# Patient Record
Sex: Male | Born: 1940 | Race: White | Hispanic: No | Marital: Married | Smoking: Former smoker
Health system: Southern US, Community
[De-identification: ages and names within clinical notes are randomized; demographics above are authoritative.]

## PROBLEM LIST (undated history)

## (undated) DIAGNOSIS — R32 Unspecified urinary incontinence: Secondary | ICD-10-CM

## (undated) DIAGNOSIS — R195 Other fecal abnormalities: Secondary | ICD-10-CM

## (undated) DIAGNOSIS — T7840XA Allergy, unspecified, initial encounter: Secondary | ICD-10-CM

## (undated) DIAGNOSIS — Z5189 Encounter for other specified aftercare: Secondary | ICD-10-CM

## (undated) DIAGNOSIS — F32A Depression, unspecified: Secondary | ICD-10-CM

## (undated) DIAGNOSIS — H9190 Unspecified hearing loss, unspecified ear: Secondary | ICD-10-CM

## (undated) DIAGNOSIS — K08109 Complete loss of teeth, unspecified cause, unspecified class: Secondary | ICD-10-CM

## (undated) DIAGNOSIS — I10 Essential (primary) hypertension: Secondary | ICD-10-CM

## (undated) DIAGNOSIS — R6889 Other general symptoms and signs: Secondary | ICD-10-CM

## (undated) DIAGNOSIS — Z860100 Personal history of colon polyps, unspecified: Secondary | ICD-10-CM

## (undated) DIAGNOSIS — Z972 Presence of dental prosthetic device (complete) (partial): Secondary | ICD-10-CM

## (undated) DIAGNOSIS — H269 Unspecified cataract: Secondary | ICD-10-CM

## (undated) DIAGNOSIS — M1990 Unspecified osteoarthritis, unspecified site: Secondary | ICD-10-CM

## (undated) DIAGNOSIS — C801 Malignant (primary) neoplasm, unspecified: Secondary | ICD-10-CM

## (undated) DIAGNOSIS — E785 Hyperlipidemia, unspecified: Secondary | ICD-10-CM

## (undated) DIAGNOSIS — Z87440 Personal history of urinary (tract) infections: Secondary | ICD-10-CM

## (undated) DIAGNOSIS — N400 Enlarged prostate without lower urinary tract symptoms: Secondary | ICD-10-CM

## (undated) DIAGNOSIS — K469 Unspecified abdominal hernia without obstruction or gangrene: Secondary | ICD-10-CM

## (undated) DIAGNOSIS — Z9109 Other allergy status, other than to drugs and biological substances: Secondary | ICD-10-CM

## (undated) DIAGNOSIS — F329 Major depressive disorder, single episode, unspecified: Secondary | ICD-10-CM

## (undated) HISTORY — DX: Depression, unspecified: F32.A

## (undated) HISTORY — DX: Benign prostatic hyperplasia without lower urinary tract symptoms: N40.0

## (undated) HISTORY — PX: COLONOSCOPY: SHX174

## (undated) HISTORY — DX: Other general symptoms and signs: R68.89

## (undated) HISTORY — DX: Complete loss of teeth, unspecified cause, unspecified class: K08.109

## (undated) HISTORY — DX: Allergy, unspecified, initial encounter: T78.40XA

## (undated) HISTORY — DX: Unspecified hearing loss, unspecified ear: H91.90

## (undated) HISTORY — DX: Other fecal abnormalities: R19.5

## (undated) HISTORY — DX: Hyperlipidemia, unspecified: E78.5

## (undated) HISTORY — DX: Major depressive disorder, single episode, unspecified: F32.9

## (undated) HISTORY — DX: Unspecified osteoarthritis, unspecified site: M19.90

## (undated) HISTORY — PX: MULTIPLE TOOTH EXTRACTIONS: SHX2053

## (undated) HISTORY — DX: Complete loss of teeth, unspecified cause, unspecified class: Z97.2

## (undated) HISTORY — DX: Encounter for other specified aftercare: Z51.89

## (undated) HISTORY — DX: Essential (primary) hypertension: I10

---

## 1997-12-17 ENCOUNTER — Encounter: Payer: Self-pay | Admitting: Internal Medicine

## 1997-12-17 ENCOUNTER — Ambulatory Visit (HOSPITAL_COMMUNITY): Admission: RE | Admit: 1997-12-17 | Discharge: 1997-12-17 | Payer: Self-pay | Admitting: Internal Medicine

## 2003-10-24 ENCOUNTER — Other Ambulatory Visit: Admission: RE | Admit: 2003-10-24 | Discharge: 2003-10-24 | Payer: Self-pay | Admitting: Dermatology

## 2004-07-20 ENCOUNTER — Ambulatory Visit: Payer: Self-pay | Admitting: Internal Medicine

## 2004-09-04 ENCOUNTER — Ambulatory Visit: Payer: Self-pay | Admitting: Internal Medicine

## 2004-09-11 ENCOUNTER — Ambulatory Visit: Payer: Self-pay | Admitting: Internal Medicine

## 2004-09-15 ENCOUNTER — Encounter: Admission: RE | Admit: 2004-09-15 | Discharge: 2004-09-15 | Payer: Self-pay | Admitting: Internal Medicine

## 2004-10-06 ENCOUNTER — Ambulatory Visit: Payer: Self-pay | Admitting: Internal Medicine

## 2005-05-25 ENCOUNTER — Ambulatory Visit: Payer: Self-pay | Admitting: Internal Medicine

## 2006-01-14 ENCOUNTER — Ambulatory Visit: Payer: Self-pay | Admitting: Internal Medicine

## 2006-04-08 ENCOUNTER — Ambulatory Visit: Payer: Self-pay | Admitting: Endocrinology

## 2006-05-31 ENCOUNTER — Ambulatory Visit: Payer: Self-pay | Admitting: Internal Medicine

## 2006-05-31 LAB — CONVERTED CEMR LAB
Calcium: 9 mg/dL (ref 8.4–10.5)
Chloride: 102 meq/L (ref 96–112)
Direct LDL: 134.8 mg/dL
GFR calc non Af Amer: 90 mL/min
Glucose, Bld: 108 mg/dL — ABNORMAL HIGH (ref 70–99)
PSA: 0.56 ng/mL (ref 0.10–4.00)
TSH: 1.74 microintl units/mL (ref 0.35–5.50)
Total CHOL/HDL Ratio: 6

## 2006-07-05 ENCOUNTER — Ambulatory Visit: Payer: Self-pay | Admitting: Internal Medicine

## 2006-07-05 LAB — CONVERTED CEMR LAB
Bilirubin Urine: NEGATIVE
Hemoglobin, Urine: NEGATIVE
Nitrite: NEGATIVE
Total Protein, Urine: NEGATIVE mg/dL
Urine Glucose: NEGATIVE mg/dL
Urobilinogen, UA: 0.2 (ref 0.0–1.0)
pH: 6 (ref 5.0–8.0)

## 2006-09-20 ENCOUNTER — Ambulatory Visit: Payer: Self-pay | Admitting: Internal Medicine

## 2006-09-24 ENCOUNTER — Emergency Department (HOSPITAL_COMMUNITY): Admission: EM | Admit: 2006-09-24 | Discharge: 2006-09-24 | Payer: Self-pay | Admitting: Family Medicine

## 2006-10-26 ENCOUNTER — Ambulatory Visit: Payer: Self-pay | Admitting: Internal Medicine

## 2006-11-23 ENCOUNTER — Encounter: Payer: Self-pay | Admitting: Internal Medicine

## 2006-11-23 ENCOUNTER — Ambulatory Visit: Payer: Self-pay | Admitting: Internal Medicine

## 2006-11-23 DIAGNOSIS — R319 Hematuria, unspecified: Secondary | ICD-10-CM | POA: Insufficient documentation

## 2006-11-23 DIAGNOSIS — I10 Essential (primary) hypertension: Secondary | ICD-10-CM

## 2006-11-23 DIAGNOSIS — N419 Inflammatory disease of prostate, unspecified: Secondary | ICD-10-CM

## 2006-11-23 DIAGNOSIS — M169 Osteoarthritis of hip, unspecified: Secondary | ICD-10-CM

## 2006-11-23 DIAGNOSIS — F329 Major depressive disorder, single episode, unspecified: Secondary | ICD-10-CM

## 2006-11-23 DIAGNOSIS — N400 Enlarged prostate without lower urinary tract symptoms: Secondary | ICD-10-CM

## 2006-11-23 DIAGNOSIS — E782 Mixed hyperlipidemia: Secondary | ICD-10-CM | POA: Insufficient documentation

## 2006-11-23 DIAGNOSIS — E785 Hyperlipidemia, unspecified: Secondary | ICD-10-CM

## 2006-12-21 DIAGNOSIS — Z5189 Encounter for other specified aftercare: Secondary | ICD-10-CM

## 2006-12-21 HISTORY — PX: JOINT REPLACEMENT: SHX530

## 2006-12-21 HISTORY — DX: Encounter for other specified aftercare: Z51.89

## 2007-01-04 ENCOUNTER — Encounter: Payer: Self-pay | Admitting: *Deleted

## 2007-01-09 ENCOUNTER — Inpatient Hospital Stay (HOSPITAL_COMMUNITY): Admission: RE | Admit: 2007-01-09 | Discharge: 2007-01-13 | Payer: Self-pay | Admitting: Orthopedic Surgery

## 2007-02-03 ENCOUNTER — Telehealth: Payer: Self-pay | Admitting: Internal Medicine

## 2007-06-23 ENCOUNTER — Telehealth: Payer: Self-pay | Admitting: Internal Medicine

## 2007-10-02 ENCOUNTER — Telehealth: Payer: Self-pay | Admitting: Internal Medicine

## 2007-10-17 ENCOUNTER — Ambulatory Visit: Payer: Self-pay | Admitting: Internal Medicine

## 2007-10-17 DIAGNOSIS — B3749 Other urogenital candidiasis: Secondary | ICD-10-CM

## 2007-11-06 ENCOUNTER — Telehealth: Payer: Self-pay | Admitting: Internal Medicine

## 2008-01-12 ENCOUNTER — Ambulatory Visit: Payer: Self-pay | Admitting: Internal Medicine

## 2008-01-30 ENCOUNTER — Ambulatory Visit: Payer: Self-pay | Admitting: Internal Medicine

## 2008-01-30 DIAGNOSIS — N478 Other disorders of prepuce: Secondary | ICD-10-CM

## 2008-01-30 DIAGNOSIS — N471 Phimosis: Secondary | ICD-10-CM

## 2008-03-27 ENCOUNTER — Encounter: Payer: Self-pay | Admitting: Internal Medicine

## 2008-08-26 ENCOUNTER — Encounter: Payer: Self-pay | Admitting: Internal Medicine

## 2008-09-13 ENCOUNTER — Ambulatory Visit: Payer: Self-pay | Admitting: Internal Medicine

## 2008-09-13 LAB — CONVERTED CEMR LAB
ALT: 19 units/L (ref 0–53)
Albumin: 3.7 g/dL (ref 3.5–5.2)
BUN: 22 mg/dL (ref 6–23)
CO2: 33 meq/L — ABNORMAL HIGH (ref 19–32)
Calcium: 8.6 mg/dL (ref 8.4–10.5)
GFR calc non Af Amer: 118.79 mL/min (ref >60)
Glucose, Bld: 102 mg/dL — ABNORMAL HIGH (ref 70–99)
HDL: 40.8 mg/dL (ref >39.00)
Total Protein: 6.9 g/dL (ref 6.0–8.3)
Triglycerides: 206 mg/dL — ABNORMAL HIGH (ref 0.0–149.0)

## 2008-09-15 ENCOUNTER — Encounter: Payer: Self-pay | Admitting: Internal Medicine

## 2008-10-08 ENCOUNTER — Encounter: Payer: Self-pay | Admitting: Internal Medicine

## 2008-12-10 ENCOUNTER — Ambulatory Visit: Payer: Self-pay | Admitting: Internal Medicine

## 2008-12-10 LAB — CONVERTED CEMR LAB
Albumin: 3.5 g/dL (ref 3.5–5.2)
Alkaline Phosphatase: 61 units/L (ref 39–117)
Bilirubin, Direct: 0.1 mg/dL (ref 0.0–0.3)
LDL Cholesterol: 110 mg/dL — ABNORMAL HIGH (ref 0–99)
Total CHOL/HDL Ratio: 5
Total Protein: 7 g/dL (ref 6.0–8.3)
Triglycerides: 155 mg/dL — ABNORMAL HIGH (ref 0.0–149.0)
VLDL: 31 mg/dL (ref 0.0–40.0)

## 2008-12-14 ENCOUNTER — Encounter: Payer: Self-pay | Admitting: Internal Medicine

## 2009-01-14 ENCOUNTER — Ambulatory Visit: Payer: Self-pay | Admitting: Internal Medicine

## 2009-11-21 ENCOUNTER — Encounter: Admission: RE | Admit: 2009-11-21 | Discharge: 2009-11-21 | Payer: Self-pay | Admitting: Orthopedic Surgery

## 2009-12-02 ENCOUNTER — Encounter: Payer: Self-pay | Admitting: Internal Medicine

## 2009-12-02 ENCOUNTER — Ambulatory Visit: Payer: Self-pay | Admitting: Internal Medicine

## 2009-12-02 DIAGNOSIS — M51379 Other intervertebral disc degeneration, lumbosacral region without mention of lumbar back pain or lower extremity pain: Secondary | ICD-10-CM | POA: Insufficient documentation

## 2009-12-02 DIAGNOSIS — M5137 Other intervertebral disc degeneration, lumbosacral region: Secondary | ICD-10-CM

## 2009-12-09 ENCOUNTER — Ambulatory Visit: Payer: Self-pay | Admitting: Internal Medicine

## 2009-12-09 LAB — CONVERTED CEMR LAB
ALT: 18 units/L (ref 0–53)
AST: 26 units/L (ref 0–37)
Albumin: 3.7 g/dL (ref 3.5–5.2)
Calcium: 8.7 mg/dL (ref 8.4–10.5)
GFR calc non Af Amer: 112.77 mL/min (ref >60)
Glucose, Bld: 94 mg/dL (ref 70–99)
HDL: 35.5 mg/dL — ABNORMAL LOW (ref >39.00)
PSA: 0.2 ng/mL (ref 0.10–4.00)
Potassium: 4.4 meq/L (ref 3.5–5.1)
Sodium: 141 meq/L (ref 135–145)
Total Bilirubin: 0.4 mg/dL (ref 0.3–1.2)
Triglycerides: 157 mg/dL — ABNORMAL HIGH (ref 0.0–149.0)
VLDL: 31.4 mg/dL (ref 0.0–40.0)

## 2009-12-15 ENCOUNTER — Encounter: Payer: Self-pay | Admitting: Internal Medicine

## 2010-03-31 ENCOUNTER — Telehealth: Payer: Self-pay | Admitting: Internal Medicine

## 2010-04-21 NOTE — Letter (Signed)
[facility] [address] [place], [place]  [address] Phone: [phone]      December 15, 2009   Gabriel Love [address] [place], [place] [address]  RE:  LAB RESULTS  Dear  Mr. Shatto,  The following is an interpretation of your most recent lab tests.  Please take note of any instructions provided or changes to medications that have resulted from your lab work.  PSA:  normal - no follow-up needed PSA: 0.20  ELECTROLYTES:  Good - no changes needed  KIDNEY FUNCTION TESTS:  Good - no changes needed  LIVER FUNCTION TESTS:  Good - no changes needed  LIPID PANEL:  Stable - no changes needed Triglyceride: 157.0   Cholesterol: 181   LDL: 114   HDL: 35.50   Chol/HDL%:  5   DIABETIC STUDIES:  Good - no changes needed Blood Glucose: 94   HgbA1C: 5.9     lab results look good. Keep up the good work.    Sincerely Yours,    Jacques Navy MD  Patient: [name] Note: All result statuses are Final unless otherwise noted.  Tests: (1) Lipid Panel (LIPID)   Cholesterol               181 mg/dL                   2-956     ATP III Classification            Desirable:  < 200 mg/dL                    Borderline High:  200 - 239 mg/dL               High:  > = 240 mg/dL   Triglycerides        [H]  157.0 mg/dL                 2.1-308.6     Normal:  <150 mg/dL     Borderline High:  578 - 199 mg/dL   HDL                  [L]  46.96 mg/dL                 >29.52   VLDL Cholesterol          31.4 mg/dL                  8.4-13.2   LDL Cholesterol      [H]  440 mg/dL                   1-02  CHO/HDL Ratio:  CHD Risk                             5                    Men          Women     1/2 Average Risk     3.4          3.3     Average Risk          5.0          4.4     2X Average Risk          9.6  7.1     3X Average Risk          15.0          11.0                           Tests: (2) Hepatic/Liver Function Panel (HEPATIC)   Total Bilirubin           0.4  mg/dL                   8.1-1.9   Direct Bilirubin          0.1 mg/dL                   1.4-7.8   Alkaline Phosphatase      60 U/L                      39-117   AST                       26 U/L                      0-37   ALT                       18 U/L                      0-53   Total Protein             6.6 g/dL                    2.9-5.6   Albumin                   3.7 g/dL                    2.1-3.0  Tests: (3) BMP (METABOL)   Sodium                    141 mEq/L                   135-145   Potassium                 4.4 mEq/L                   3.5-5.1   Chloride                  103 mEq/L                   96-112   Carbon Dioxide       [H]  33 mEq/L                    19-32   Glucose                   94 mg/dL                    86-57   BUN                       18 mg/dL                    8-46   Creatinine  0.7 mg/dL                   1.6-1.0   Calcium                   8.7 mg/dL                   9.6-04.5   GFR                       112.77 mL/min               >60  Tests: (4) Prostate Specific Antigen (PSA)   PSA-Hyb                   0.20 ng/mL                  0.10-4.00

## 2010-04-21 NOTE — Assessment & Plan Note (Signed)
Summary: per flag cpx/#/cd   RS'D PER WIFE /NWS   Vital Signs:  Patient profile:   70 year old male Height:      73 inches Weight:      335 pounds BMI:     44.36 O2 Sat:      97 % on Room air Temp:     96.4 degrees F oral Pulse rate:   70 / minute BP sitting:   134 / 72  (left arm) Cuff size:   large  Vitals Entered By: Bill Salinas CMA (December 02, 2009 10:07 AM)  O2 Flow:  Room air CC: cpx/ refills on meds Comments pt will get flu shot today  Vision Screening:      Vision Comments: April 2010 normal exam   Primary Care Provider:  Norins  CC:  cpx/ refills on meds.  History of Present Illness: Patient presents for routine medical exam. He has been having left leg pain worse with walking. No pain with sitting or laying down. He has had MRI spine with appearance of bulging disks and bony changes (images reviewed). He is consulting with Dr. Ophelia Charter. He will be having ESI for relief.   He reports he is generally feeling OK. He remains very active: he is Tax adviser for [employer] and remains independent in all ADLs. 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 His spirits are good and he enjoys his hobbies and family. No evidence of sign of depression.   Preventive Screening-Counseling & Management  Alcohol-Tobacco     Alcohol drinks/day: 0     Smoking Status: quit     Year Quit: 1970  Caffeine-Diet-Exercise     Caffeine use/day: 5 drinks per day     Does Patient Exercise: no  Hep-HIV-STD-Contraception     Dental Visit-last 6 months no     Sun Exposure-Excessive: no  Safety-Violence-Falls     Seat Belt Use: yes     Firearms in the Home: firearms in the home     Smoke Detectors: yes     Violence in the Home: no risk noted     Sexual Abuse: no     Fall Risk: Low Fall Risk      Drug Use:  never.    Current Medications (verified): 1)  Ecotrin 325 Mg  Tbec (Aspirin) .... Once Daily 2)  Hydrochlorothiazide 25 Mg   Tabs (Hydrochlorothiazide) .... Once Daily 3)  Finasteride 5 Mg  Tabs (Finasteride) .... Once Daily 4)  Lexapro 10 Mg  Tabs (Escitalopram Oxalate) .... Once Daily 5)  Flomax 0.4 Mg  Cp24 (Tamsulosin Hcl) .... Once Daily 6)  Vitamin C 500 Mg  Tabs (Ascorbic Acid) .... Take 2 Tablet By Mouth Once A Day 7)  Lovastatin 20 Mg Tabs (Lovastatin) .Marland Kitchen.. 1 By Mouth Q Pm For Cholesterol  Allergies (verified): 1)  ! Penicillin 2)  ! 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  Family History: father - deceased @ 21: PNA, HTN mother - deceased @ 16: CAD/MI with rupture of ventricle, HTN, DM Neg- colon or prostate cancer  Social History: married '65 -very supportive wife 1 daughter - '48; 1 grandchild (girl 2002) retired from Recruitment consultant work Caffeine use/day:  5 drinks per day Does Patient Exercise:  no Dental Care w/in 6 mos.:  no Sun Exposure-Excessive:  no Seat Belt Use:  yes Fall Risk:  Low Fall Risk Drug Use:  never  Review of Systems  The patient denies anorexia, fever, weight loss, weight gain, decreased hearing, chest pain, syncope, dyspnea on exertion, prolonged cough, hemoptysis, abdominal pain, severe indigestion/heartburn, incontinence, muscle weakness, difficulty walking, depression, and enlarged lymph nodes.    Physical Exam  General:  very large, overweight white male in no distress Head:  normocephalic, atraumatic, and no abnormalities observed.   Eyes:  vision grossly intact, pupils equal, pupils round, corneas and lenses clear, and no optic disk abnormalities.   Ears:  hearing aids both ears. EACs with moderate cerumen right, left is clear Nose:  no external deformity and no external erythema.   Mouth:  upper denture. missing majority of lower  teeth. Has a broken incisor right. No buccal lesions. Posterior pharynx clear Neck:  supple, no thyromegaly, and no carotid bruits.   Chest Wall:  No deformities, masses, tenderness or gynecomastia noted. Lungs:  Normal respiratory effort, chest expands symmetrically. Lungs are clear to auscultation, no crackles or wheezes. Heart:  Normal rate and regular rhythm. S1 and S2 normal without gallop, murmur, click, rub or other extra sounds. Abdomen:  Obese,soft, normal bowel sounds, and no guarding.   Rectal:  deferred Msk:  normal ROM, no joint tenderness, no joint swelling, no joint warmth, and no joint deformities.   Pulses:  2+ radial pulse Extremities:  No clubbing, cyanosis, edema, or deformity noted with normal full range of motion of all joints.   Neurologic:  alert & oriented X3, cranial nerves II-XII intact, strength normal in all extremities, gait normal, and DTRs symmetrical and normal.   Skin:  turgor normal, no rashes, no suspicious lesions, and no ulcerations.   Cervical Nodes:  no anterior cervical adenopathy and no posterior cervical adenopathy.   Psych:  Oriented X3, memory intact for recent and remote, good eye contact, and not anxious appearing.     Impression & Recommendations:  Problem # 1:  OBESITY NOS (ICD-278.00) Majaor issue regarding his health. Counselled on weight management: smart food choices, portion size control (meal of 1000 chews), strongly recommended 3 meals a day and a snack. Target weight 280. Goal - to loose 1 lb/month.  Problem # 2:  DEPRESSION (ICD-311) Doing very well with lexapro with  no medication problems as long as he takes brand name.  Plan - continue meds.   His updated medication list for this problem includes:    Lexapro 10 Mg Tabs (Escitalopram oxalate) ..... Once daily  Problem # 3:  HYPERTENSION (ICD-401.9)  His updated medication list for this problem includes:    Hydrochlorothiazide 25 Mg Tabs (Hydrochlorothiazide) ..... Once  daily  BP today: 134/72 Prior BP: 138/88 (09/13/2008)  Labs Reviewed: K+: 3.6 (09/13/2008) Creat: : 0.7 (09/13/2008)   Chol: 175 (12/10/2008)   HDL: 34.30 (12/10/2008)   LDL: 110 (12/10/2008)   TG: 155.0 (12/10/2008)  Adequate control.  Plan - continue present meds           up-date labs  Problem # 4:  HYPERLIPIDEMIA (ICD-272.4) Due for lab work with recommendations to follow.  His updated medication list for this problem includes:    Lovastatin 20 Mg Tabs (Lovastatin) .Marland Kitchen... 1 by mouth q pm for cholesterol  Problem # 5:  DEGENERATIVE DISC DISEASE, LUMBAR SPINE (ICD-722.52) Patient with leg pain left which is attributed to DJD/DDD lumbar spine by MRI. He is seeing orthopedist. He did not have major  radicular symptoms today.   Plan - proceeding with ESI for next Tuesday.  Problem # 6:  Preventive Health Care (ICD-V70.0)  Recent history significant for sciatica otherwise he is doing OK. Exam remarkable for obesity otherwise stable. He is to return for full lab. Discussed colorectal cancer screening and he is willing to consider colonoscopy but wants to wait until his back problems are dealt with. Immunization - Tetnus '09, Pneumonia vaccine '10, flu today. He is [facility] to [facility] for shingles vaccine.  Patient's depression is well controlled. He is cognitively intact (HPI). He is counseled and cautioned about fall risk being elevated seondary to his back problems and that he must be careful about work related injury - minimize lifting. He is counseled about his weight.  In summary - a very nice man who needs colonoscopy, who is contemplating bak surgery if ESI fails but whose medical problems are reasonable controlled. He has increased risks for general anesthesia and surgery secondary to his weight but this is not prohibitive. He will return in 6 months for follow-up. He will return sooner if lab results are abnormal.  Orders: [facility] -Subsequent Annual Wellness Visit [phone])  Complete  Medication List: 1)  Ecotrin 325 Mg Tbec (Aspirin) .... Once daily 2)  Hydrochlorothiazide 25 Mg Tabs (Hydrochlorothiazide) .... Once daily 3)  Finasteride 5 Mg Tabs (Finasteride) .... Once daily 4)  Lexapro 10 Mg Tabs (Escitalopram oxalate) .... Once daily 5)  Flomax 0.4 Mg Cp24 (Tamsulosin hcl) .... Once daily 6)  Vitamin C 500 Mg Tabs (Ascorbic acid) .... Take 2 tablet by mouth once a day 7)  Lovastatin 20 Mg Tabs (Lovastatin) .Marland Kitchen.. 1 by mouth q pm for cholesterol  Other Orders: Flu Vaccine 17yrs + MEDICARE PATIENTS (U0454) Administration Flu vaccine - MCR (U9811)   Preventive Care Screening  Last Pneumovax:    Date:  05/22/2006    Results:  given     Flu Vaccine Consent Questions     Do you have a history of severe allergic reactions to this vaccine? no    Any prior history of allergic reactions to egg and/or gelatin? no    Do you have a sensitivity to the preservative Thimersol? no    Do you have a past history of Guillan-Barre Syndrome? no    Do you currently have an acute febrile illness? no    Have you ever had a severe reaction to latex? no    Vaccine information given and explained to patient? yes    Are you currently pregnant? no    Lot Number:[id number]   Exp Date:09/19/2010   Site Given  Left Deltoid IMedflu

## 2010-04-23 NOTE — Progress Notes (Signed)
Summary: JURY DUTY NOTE  Phone Note Call from Patient Call back at Home Phone [phone]   Caller: Spouse Reason for Call: Talk to Nurse Summary of Call: NEEDS A LETTER TO GET OUT OF JURY DUTY ......DUE TO BR BREAKS AND UNABLE TO SEAT FOR LONG PERIODS OF TIME, PLEASE MAIL LETTER TO HOME Initial call taken by: Migdalia Dk,  March 31, 2010 10:28 AM  Follow-up for Phone Call        need the jury summons with dates and juror number and will do the form letter.  Follow-up by: Jacques Navy MD,  March 31, 2010 6:05 PM  Additional Follow-up for Phone Call Additional follow up Details #1::        Pt advised via home VM to bring letter or call back with requested information. Margaret Pyle, CMA  April 02, 2010 8:35 AM     Additional Follow-up for Phone Call Additional follow up Details #2::    lmoam for pt to call back Follow-up by: Ami Bullins CMA,  April 09, 2010 3:43 PM  Additional Follow-up for Phone Call Additional follow up Details #3:: Details for Additional Follow-up Action Taken: spoke with pts wife. Pt no longer needs letter Additional Follow-up by: Ami Bullins CMA,  April 10, 2010 10:12 AM

## 2010-07-05 ENCOUNTER — Other Ambulatory Visit: Payer: Self-pay | Admitting: Internal Medicine

## 2010-08-04 NOTE — Op Note (Signed)
NAME:  Gabriel Love, Gabriel Love                ACCOUNT NO.:  [id number]   MEDICAL RECORD NO.:  [id number]          PATIENT TYPE:  INP   LOCATION:  5033                         FACILITY:  [facility]   PHYSICIAN:  Loreta Ave, M.D. DATE OF BIRTH:  1940/12/01   DATE OF PROCEDURE:  01/09/2007  DATE OF DISCHARGE:                               OPERATIVE REPORT   PREOPERATIVE DIAGNOSIS:  End-stage degenerative arthritis left hip.  Marked exogenous obesity.   POSTOPERATIVE DIAGNOSIS:  End-stage degenerative arthritis left hip.  Marked exogenous obesity.   PROCEDURE:  Left total hip replacement, Stryker Accolade prosthesis.  Press-Fit #5 femoral component with a 127 degrees neck angle and a +0 x  40 mm metallic head.  Press-Fit 64-mm acetabular component screw  fixation x2 and a 40-mm internal diameter X 3 polyethylene insert.   SURGEON:  Loreta Ave, M.D.   ASSISTANT:  Zonia Kief, PA present throughout the entire case.   ANESTHESIA:  General.   BLOOD LOSS:  250 mL.   BLOOD GIVEN:  None.   SPECIMENS:  None.   CULTURES:  None.   COMPLICATIONS:  None.   PROCEDURE:  Soft compressive with abduction pillow.   PROCEDURE:  The patient brought to the operating room and after adequate  anesthesia been obtained, turned to a lateral position.  Appropriate  padding and support.  His degree of obesity made all positioning, all  anesthesia, all operative intervention, all approaches exceedingly more  difficult.  After being prepped and draped, an incision along the  lateral border of the femur extending posterosuperior.  Skin and  subcutaneous tissue divided.  After going through numerous layers of  subcutaneous fat, iliotibial band exposed, incised and a Charnley  retractor put in place.  External rotator capsule taken down off the  back of the intertrochanteric groove of the femur.  Dislocated  posteriorly.  Grade 4 changes.  Femoral neck was cut one fingerbreadth  above the lesser trochanter  in line with the definitive component.  After much work with exposure, the acetabulum was finally exposed with  appropriate retractors.  Soft tissue removed.  Grade 4 changes.  Sequential reaming up to 64 mm which gave me good fitting and filling of  the acetabulum with appropriate medial and inferior placement.  Cup was  hammered in place with a 64-mm PSL cup at 45 degrees abduction, 20  degrees of anteversion.  Fixation augmented with two screws through the  cup.  Polyethylene attached to the cup.  Attention turned to the femur.  Handheld reamers up to good filling.  Very tight distally, more so than  proximally.  Good sizing fitting with a #5 back loaded component which  restored normal femoral anteversion.  After appropriate trials a #5  component was definitively seated.  Attaching the +0 x 40-mm head and a  127 degrees neck angle, I had good restoration of leg lengths, good  stability in flexion and extension.  Definitive components were  assembled.  The hip was reduced. Wound irrigated.  External rotator and  capsule repaired to the back of the intertrochanteric groove through  drill holes and a FiberWire that was used to tag the external rotators  were tied over bony bridge.  Charnley retractor removed.  Iliotibial  band closed with #1 Vicryl.  Skin and subcutaneous tissue with numerous  layers of Vicryl and staples.  Margins of wound were injected Marcaine.  Sterile compressive dressing applied.  Returned to supine position.  Abduction pillow placed after anesthesia reversed.  Brought to recovery  room.  Tolerated surgery well.  No complications.      Loreta Ave, M.D.  Electronically Signed     DFM/MEDQ  D:  01/09/2007  T:  01/10/2007  Job:  [id number]

## 2010-08-04 NOTE — Assessment & Plan Note (Signed)
[facility]                                 ON-CALL NOTE   Gabriel Love, Gabriel Love                         MRN:          [id number]  DATE:09/24/2006                            DOB:          08-13-1940    PRIMARY CARE PHYSICIAN:  Illene Regulus, M.D.   ON CALL PROGRESS NOTE:  The patient calling because he has hematuria.  Referred to the emergency room for evaluation.     Jeffrey A. Tawanna Cooler, MD  Electronically Signed    JAT/MedQ  DD: 09/24/2006  DT: 09/25/2006  Job #: [id number]   cc:   Rosalyn Gess. Norins, MD

## 2010-08-07 NOTE — Discharge Summary (Signed)
NAME:  TATEConard, Alvira                ACCOUNT NO.:  [id number]   MEDICAL RECORD NO.:  [id number]          PATIENT TYPE:  INP   LOCATION:  5033                         FACILITY:  [facility]   PHYSICIAN:  Loreta Ave, M.D. DATE OF BIRTH:  [birth year]   DATE OF ADMISSION:  01/09/2007  DATE OF DISCHARGE:  01/13/2007                               DISCHARGE SUMMARY   FINAL DIAGNOSES:  1. Status post left total hip replacement for end-stage degenerative      joint disease.  2. Depression.  3. Benign prostatic hypertrophy.  4. Hypertension.   HISTORY OF PRESENT ILLNESS:  This is a 70 year old male with history of  end-stage DJD of left hip with chronic pain, who presented to our office  for preoperative evaluation for a total hip replacement procedure.  He  had progressively worsening pain with no response with conservative  treatment.  Significant decrease in his daily activities due to the  ongoing complaint.   HOSPITAL COURSE:  On January 09, 2007, the patient was taken to the  [facility] OR and had a left total hip replacement procedure performed.  The surgeon was Loreta Ave, MD, and assistant Zonia Kief PA-C.  Anesthesia was general.  No specimens.  EBL 300 mL.  There were no  surgical or anesthesia complications and the patient was transferred to  recovery in stable condition.   On January 10, 2007, the patient was doing well with good pain control.  Temperature 101.4, pulse 107, respirations 20, blood pressure 103/70.  Hemoglobin 10.2, hematocrit 30.7.  Sodium 134, potassium 3.5, chloride  98, CO2 31, BUN 21, creatinine 0.78, glucose 124, INR 1.2.  Dressing was  clean, dry, and intact.  Calf nontender.  Neurovascularly intact.  Discontinued from morphine PCA.  PT/OT consultation.  Pharmacy protocol  Coumadin was started.  On January 11, 2007 the patient had good pain  control.  He complained of feeling weak and light-headed when up with  PT.  He denied chest pain or shortness  of breath.  Hemoglobin 8.6,  hematocrit 25.2.  Sodium 135, potassium 3.8, chloride 98, CO2 32, BUN  14, creatinine 0.81.  Glucose 130.  INR 1.3.  Wound looked good, staples  intact.  No drainage or signs of infection.  Thigh/calf soft and  nontender.  Neurovascularly intact.  Transfused 2 units of packed red  blood cells secondary to acute blood loss anemia.  Lasix 20 mg IV given  after each unit.  Started FeSO4 325 mg p.o. b.i.d.  Stat UA and urine C  and S ordered secondary to abnormal UA previously.   On January 12, 2007, the patient felt much better after transfusion.  He  had good pain control.  Vital signs stable, he was afebrile.  Hemoglobin  10.0, hematocrit 28.3.  Sodium 135, potassium 3.2, chloride 100, CO2 31,  BUN 14, creatinine 0.83, glucose 115, INR 1.2.  Wound okay.  Mild to  moderate serosanguineous drainage.  No signs of infection.  Calf  nontender.  Neurovascularly intact.  Discontinued Foley.  UA was  positive for UTI.  Started Cipro.  KCl 40 mEq b.i.d. x1 day started.  On  January 13, 2007, the patient was doing well with good pain control.  Some hall ambulation.  He states that he is ready to go home.  Vital  signs stable, afebrile.  Hemoglobin 10.0, hematocrit 29.7.  INR 1.3.  Electrolytes stable.  Urine culture:  Positive E. coli, Cipro resistant.  The hip wound looks good.  The staples intact.  Moderate amount of  serosanguineous drainage.  No signs of infection.  Calf nontender.  Neurovascularly intact.  Ready to discharge home.   CONDITION ON DISCHARGE:  Good and stable.   DISPOSITION:  Discharged home.   MEDICATIONS:  1. Percocet 7.5/325 mg, 1-2 tabs p.o. q.4-6 h. p.r.n. for pain.  2. Coumadin by pharmacy protocol.  3. Septra DS 1 tab p.o. b.i.d.  4. Lovenox 40 mg one subcutaneous injection daily x3 days.  5. Robaxin 500 mg one tab p.o. q.6 h. p.r.n. for spasms.  6. Resume previous home medications.   DISCHARGE INSTRUCTIONS:  The patient will use PT/OT  to improve  ambulation and strengthening.  He is weightbearing as tolerated.  Coumadin x4 weeks for postoperative DVT prophylaxis.  Daily dressing  changes with 4 x 4 gauze and tape.  To follow up in the office when he  is 2 weeks postop for recheck.  To return sooner if needed.      Genene Churn. [name].      Loreta Ave, M.D.  Electronically Signed    JMO/MEDQ  D:  02/08/2007  T:  02/08/2007  Job:  [id number]

## 2010-08-07 NOTE — Assessment & Plan Note (Signed)
[facility]                           PRIMARY CARE OFFICE NOTE   HARLIN, MAZZONI                       MRN:          [id number]  DATE:06/21/2006                            DOB:          December 20, 1940    Mr. Remedios had called requesting a refill prescription for ciprofloxacin,  previously prescribed for prostatitis.   I called the patient.  He reports that after being switched to doxazosin  4 mg q.h.s. to replace Flomax 0.4 mg q.h.s., he developed progressive  problems with urinary retention.  Over the weekend, it got to the point  where he was unable to micturate comfortably and was having significant  amount of pain and discomfort with micturition.  Patient resumed taking  Flomax and had very rapid resolution of his symptoms.  He reports that  yesterday he had no problems with urinary retention, and very little, if  any, discomforts.   Patient denied any fevers, chills, pain, low back pain, perineal pain or  hematuria.   PLAN:  Patient is to continue with Flomax 0.4 mg daily, no antibiotics  were prescribed at this time.  If he has ongoing problems, we will be  happy to talk this over with him in person.     Rosalyn Gess Norins, MD  Electronically Signed    MEN/MedQ  DD: 06/21/2006  DT: 06/21/2006  Job #: [id number]

## 2010-08-07 NOTE — Assessment & Plan Note (Signed)
[facility]                           PRIMARY CARE OFFICE NOTE   WARNELL, RASNIC                       MRN:          [id number]  DATE:05/31/2006                            DOB:          01-Dec-1940    [name] is a 70 year old gentleman who presents for followup evaluation  and exam.  He was last seen in the office on April 08, 2006 by Dr.  Everardo All for anxiety, and was started on BuSpar 15 mg b.i.d.  He reports  he is sleeping better, and seems to be less anxious and tearful on this  medication.  He also was seen for probable UTI, and was treated with  Cipro 500 mg b.i.d.   Patient is having increasing problems with his left hip.  He still is  able to do most of his activities, but he is somewhat limited.  He has  been seen by an orthopedist in the past, and has been told he would be a  candidate for hip replacement at some point.  Clearly, it was made  apparent to the patient that surgery would be dependent upon him as to  when he felt the pain justified the surgery.  Of note, he has great  concerns about surgery, particularly given his weight and the risks that  would be entailed.   The patient does report he has been having some increasing burning in  his feet, diffusely daytime and when standing.  He does not have  nocturnal paresthesia or dysesthesia.   PAST MEDICAL HISTORY:  Surgical:  Adult circumcision.  No other  surgeries reported.   Medical:  1. Usual childhood diseases.  2. Mild hypertension.  3. Hyperlipidemia.  4. Benign prostatic hypertrophy with hematuria and history of      prostatitis in the past.  5. Obesity.   FAMILY HISTORY:  Family would be 22.  Mother died of a heart attack at  52.  Patient had an uncle with diabetes.   SOCIAL HISTORY:  Patient has been married for many years.  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   HEALTH MAINTENANCE:  Have recommended colonoscopy to the patient, who  continues to decline colonoscopy at this point.  I did discuss again  with him the risk of colorectal cancer, and the preventive nature of  colonoscopy, and the safety of the procedure.   CHART REVIEW:  The patient's last stress Persantine Cardiolite study was  performed in 1999; was a norma study with an ejection fraction of 59%.  Patient had a 2D echo in 1999, which was normal with some aortic  valvular sclerosis, but no stenosis.  Patient had carotid evaluation  May 08, 1999, which showed no obstructive disease.  Patient has had  bilateral hip films September 15, 2004 which showed advanced left hip  osteoarthritis.  Last cardiogram was in June 2006, and showed a normal  sinus rhythm with no abnormalities.   CURRENT MEDICATIONS:  1. Aspirin 325 mg daily.  2. Hydrochlorothiazide 25 mg daily.  3. Vitamin C.  4. Finasteride 5 mg daily.  5. Flomax 0.4 mg daily.  6. BuSpar 15 mg b.i.d.  7. Nabumetone 1000 mg nightly.   REVIEW OF SYSTEMS:  Patient has had no fevers, sweats or chills.  He has  had an eye exam in the last 24 months.  No ENT complaints.  Patient does  have full upper denture.  He has 6 teeth on the mandible.  No  cardiovascular, respiratory, or GI complaints.  Patient does have  nocturia 3 to 4, which is an improvement.  Patient has left hip pain as  noted.  He also has some pain in the 3rd digit of his right hand.  He  has burning of his feet.   EXAMINATION:  Temperature was 98.1.  Blood pressure 168/95.  Pulse 91.  Weight 338.  Height 6 feet 2.  GENERAL APPEARANCE:  This is an obese Caucasian male, in no acute  distress.  HEENT EXAM:  Normocephalic and atraumatic.  EACs and TMs were  unremarkable.  Oropharynx with full upper dentures.  Six teeth on the  mandible.  No buccal lesions were noted.  Posterior pharynx was clear.  Conjunctivae and sclerae were clear.  Pupils equal, round and reactive   light and accommodation.  Funduscopic exam was unremarkable.  NECK:  Supple without thyromegaly.  NODES:  No adenopathy was noted in the cervical or supraclavicular  regions.  CHEST:  With CVA tenderness.  LUNGS:  Were clear to auscultation and percussion.  Patient has mild gynecomastia secondary his weight.  CARDIOVASCULAR:  Two plus radial pulses.  No JVD.  No carotid bruits.  He had a quiet precordium with regular rate and rhythm without murmurs,  rubs or gallops.  ABDOMEN:  Obese with positive bowel sounds in all 4 quadrants.  Girth  prevented accurate palpation.  RECTAL EXAM:  Normal sphincter was noted.  The prostate was smooth, and  normal in size and contour without nodules.  EXTREMITIES:  Patient has no obvious deformity.  NEUROLOGIC EXAM:  Patient's cranial nerves 2 through 12 are grossly  intact.  DTRs are 2+ and symmetrical.  The exam was nonfocal.   LABORATORY:  Basic metabolic panel revealed normal potassium.  Glucose  was 18.  BUN and creatinine were normal with creatinine at 0.9 and  normal GFR.  Thyroid function was normal with a TSH of 1.74.  PSA was  normal at 0.56.  Cholesterol panel revealed a total cholesterol of 216,  triglycerides of 170, HDL was low at 35.8, LDL cholesterol was mildly  elevated at 134.8.  B12 level was checked and was at low range of normal  at 305.  Patient had PA and lateral chest x-ray, which showed no active  disease abnormality, and no cardiomegaly.   ASSESSMENT AND PLAN:  1. Hypertension.  Patient's blood pressure is running high.  Readings      at home shown to be in the 90s on the diastolic side, and 150s on      systolic.  Plan:  Patient is switched to doxazosin 4 mg nightly to      see if this plus hydrochlorothiazide will bring his pressure under      better control.  2. Benign prostatic hypertrophy.  Patient is doing relatively well in     regards to nocturia and symptoms on the finasteride and Flomax.  We      will switch him  to doxazosin as noted.  3. Anxiety and depression.  Patient  is tolerating BuSpar.  He reports      it seems to be giving him some relief, and will continue with this.  4. Pulmonary.  On questioning, the patient does snore per his wife,      and does have occasional breath holding.  He is at risk for      obstructive sleep apnea.  If he continues having difficulty with      snoring or somnolence, we need to pursue this further.  5. Orthopedic.  Patient with known degenerative disease in the left      hip.  I have reassured him that I think he would be able to      tolerate surgery well, although his weight does put him at slightly      increased risk.  We will defer to him in regards to timing of      surgery.  Presently, he is able to do most of his activities that      he desires to do.  6. Dysesthesia.  Patient's B12 level is low normal.  If he continues      to have problems, it might be worthwhile giving him a trial of B12      replacement to get him more into normal range, and we can do this      at his discretion.  No other underlying medical reason for his      dysesthesia.  7. Lipids.  Patient is borderline on his lipids.  Given the essence of      diabetes and heart disease, goal would be 130.  He can definitely      attain with this with diet management.  8. Obesity.  I spent a good deal of time talking with the patient      about weight management.  I have strongly suggested smart food      choices and calorie restriction through portion size restriction.      I have talked with him about satiety, and frontal lobe satiety, and      I have encouraged him to eat more slowly and savor and enjoy his      food.  I have also encouraged multiple small meals a day.  Goal      would be a loss of 18 to 20 pounds per year with a total goal of a      weight of 280.  I have encouraged the patient to begin working on      this problem.  9. Health maintenance.  Patient has been seen by Dr.  Bjorn Pippin in the      past for GU for workup of hematuria, and the patient was stable,      although I am unable to locate correspondence on the chart.   Patient has declined colonoscopy, but I have encouraged him to consider  it further, and let me know if he changes his mind.   SUMMARY:  Problems as outlined above.  The patient does seem stable.  He  will return to see me on an as-needed basis.  At his convenience, we can  give him a short-term course of B12 injection at 1000 mcg a month for 3  to 4 months to see if it relieves some of his dysesthesia.     Rosalyn Gess Norins, MD  Electronically Signed    MEN/MedQ  DD: 05/31/2006  DT: 06/02/2006  Job #: [id number]   cc:   Rowan Blase

## 2010-09-06 ENCOUNTER — Other Ambulatory Visit: Payer: Self-pay | Admitting: Internal Medicine

## 2010-09-17 ENCOUNTER — Ambulatory Visit (INDEPENDENT_AMBULATORY_CARE_PROVIDER_SITE_OTHER): Payer: Medicare Other | Admitting: Internal Medicine

## 2010-09-17 ENCOUNTER — Encounter: Payer: Self-pay | Admitting: Internal Medicine

## 2010-09-17 DIAGNOSIS — H8309 Labyrinthitis, unspecified ear: Secondary | ICD-10-CM

## 2010-09-18 NOTE — Progress Notes (Signed)
  Subjective:    Patient ID: Gabriel Love, male    DOB: [birth year], 70 y.o.   MRN: [id number]  HPI Mr. Pew presents after experiencing an episode last night of dizziness. He went to the bathroom and had the on-set of feeling very off balance. He returned to bed and when he lay down the room went to spinning and he became nauseated with dry heaves and became diaphoretic. He had no focal weakness, paresthesia or weakness. This morning he was much better with only minimal dysequilbrium. He had no focal neurologic symptoms.  PMH, FamHx and SocHx reviewed for any changes and relevance.    Review of Systems Review of Systems  Constitutional:  Negative for fever, chills, activity change and unexpected weight change.  HEENT:  Negative for hearing loss, ear pain, congestion, neck stiffness and postnasal drip. Negative for sore throat or swallowing problems. Negative for dental complaints.   Eyes: Negative for vision loss or change in visual acuity.  Respiratory: Negative for chest tightness and wheezing.   Cardiovascular: Negative for chest pain and palpitation. No decreased exercise tolerance Gastrointestinal: No change in bowel habit. No bloating or gas. No reflux or indigestion Genitourinary: Negative for urgency, frequency, flank pain and difficulty urinating.  Musculoskeletal: Negative for myalgias, back pain, arthralgias and gait problem.  Neurological: Positve  for dizziness. Negative for  tremors, weakness and headaches.  Hematological: Negative for adenopathy.  Psychiatric/Behavioral: Negative for behavioral problems and dysphoric mood.       Objective:   Physical Exam Vitals noted - obesity is chronic, BP ok Gen'l - obese white male in no distress Pul - normal respirations Cor- RRR Neuro - CN II-XII grossly in tact, normal gait.        Assessment & Plan:  1. Labyrinthitis - classic presentation with rapid resolution. Gave patient and his wife a full explanation (anatomy  pictures reveiwed).  Plan - no further work-up            For recurrent symptoms - bonine 1/2 or 1 tab q 6           For recurrent symptoms accompanied by any other change - urgent office visit.

## 2010-09-25 ENCOUNTER — Other Ambulatory Visit: Payer: Self-pay | Admitting: Internal Medicine

## 2010-09-27 ENCOUNTER — Other Ambulatory Visit: Payer: Self-pay | Admitting: Internal Medicine

## 2010-11-30 ENCOUNTER — Other Ambulatory Visit: Payer: Self-pay | Admitting: Internal Medicine

## 2010-12-30 LAB — PROTIME-INR
INR: 1.2
INR: 1.2
INR: 1.3
INR: 1.3
Prothrombin Time: 15.6 — ABNORMAL HIGH
Prothrombin Time: 16.2 — ABNORMAL HIGH
Prothrombin Time: 16.5 — ABNORMAL HIGH

## 2010-12-30 LAB — CROSSMATCH
ABO/RH(D): O POS
Antibody Screen: NEGATIVE

## 2010-12-30 LAB — BASIC METABOLIC PANEL
BUN: 13
CO2: 32
Calcium: 7.8 — ABNORMAL LOW
Calcium: 7.9 — ABNORMAL LOW
Chloride: 98
Creatinine, Ser: 0.81
Creatinine, Ser: 0.83
GFR calc Af Amer: >60
GFR calc Af Amer: >60
GFR calc Af Amer: >60
GFR calc Af Amer: >60
GFR calc non Af Amer: >60
GFR calc non Af Amer: >60
GFR calc non Af Amer: >60
Potassium: 3.5
Potassium: 3.7
Sodium: 134 — ABNORMAL LOW
Sodium: 135
Sodium: 137

## 2010-12-30 LAB — CBC
HCT: 29.7 — ABNORMAL LOW
HCT: 30.7 — ABNORMAL LOW
Hemoglobin: 10.2 — ABNORMAL LOW
Hemoglobin: 8.6 — ABNORMAL LOW
MCHC: 35.2
MCV: 87.7
Platelets: 233
RBC: 2.88 — ABNORMAL LOW
RBC: 3.24 — ABNORMAL LOW
RBC: 3.5 — ABNORMAL LOW
RDW: 14
WBC: 10.8 — ABNORMAL HIGH
WBC: 11.5 — ABNORMAL HIGH
WBC: 12.7 — ABNORMAL HIGH
WBC: 9.6

## 2010-12-30 LAB — URINALYSIS, ROUTINE W REFLEX MICROSCOPIC
Hgb urine dipstick: NEGATIVE
Nitrite: POSITIVE — AB
Specific Gravity, Urine: 1.011
Urobilinogen, UA: 1
pH: 6.5

## 2010-12-30 LAB — URINE CULTURE

## 2010-12-30 LAB — URINE MICROSCOPIC-ADD ON

## 2010-12-31 LAB — URINE MICROSCOPIC-ADD ON

## 2010-12-31 LAB — TYPE AND SCREEN: Antibody Screen: NEGATIVE

## 2010-12-31 LAB — COMPREHENSIVE METABOLIC PANEL
ALT: 24
AST: 27
Albumin: 3.8
Alkaline Phosphatase: 83
CO2: 31
Chloride: 100
GFR calc Af Amer: >60
GFR calc non Af Amer: >60
Potassium: 3.9
Sodium: 140
Total Bilirubin: 0.8

## 2010-12-31 LAB — CBC
MCV: 87.1
RBC: 5.18
WBC: 9.9

## 2010-12-31 LAB — URINALYSIS, ROUTINE W REFLEX MICROSCOPIC
Bilirubin Urine: NEGATIVE
Glucose, UA: NEGATIVE
Ketones, ur: NEGATIVE
Specific Gravity, Urine: 1.019
pH: 6.5

## 2011-01-05 LAB — POCT URINALYSIS DIP (DEVICE)
Glucose, UA: NEGATIVE
Nitrite: POSITIVE — AB
Operator id: 270961
Protein, ur: >=300 — AB
Specific Gravity, Urine: 1.025
Urobilinogen, UA: 2 — ABNORMAL HIGH
pH: 6

## 2011-01-05 LAB — URINE CULTURE: Colony Count: >=100000

## 2011-01-24 ENCOUNTER — Other Ambulatory Visit: Payer: Self-pay | Admitting: Internal Medicine

## 2011-02-24 ENCOUNTER — Ambulatory Visit (INDEPENDENT_AMBULATORY_CARE_PROVIDER_SITE_OTHER): Payer: Medicare Other | Admitting: *Deleted

## 2011-02-24 DIAGNOSIS — Z23 Encounter for immunization: Secondary | ICD-10-CM

## 2011-02-25 DIAGNOSIS — Z23 Encounter for immunization: Secondary | ICD-10-CM

## 2011-03-29 HISTORY — PX: PROSTATE SURGERY: SHX751

## 2011-04-03 ENCOUNTER — Other Ambulatory Visit: Payer: Self-pay | Admitting: Internal Medicine

## 2011-05-23 ENCOUNTER — Other Ambulatory Visit: Payer: Self-pay | Admitting: Internal Medicine

## 2011-06-01 ENCOUNTER — Other Ambulatory Visit: Payer: Self-pay | Admitting: Internal Medicine

## 2011-07-14 ENCOUNTER — Ambulatory Visit (INDEPENDENT_AMBULATORY_CARE_PROVIDER_SITE_OTHER): Payer: Medicare Other | Admitting: Internal Medicine

## 2011-07-14 ENCOUNTER — Encounter: Payer: Self-pay | Admitting: Internal Medicine

## 2011-07-14 ENCOUNTER — Telehealth: Payer: Self-pay | Admitting: *Deleted

## 2011-07-14 VITALS — BP 122/78 | HR 95 | Temp 97.0°F | Resp 16 | Wt 350.0 lb

## 2011-07-14 DIAGNOSIS — R0989 Other specified symptoms and signs involving the circulatory and respiratory systems: Secondary | ICD-10-CM

## 2011-07-14 NOTE — Progress Notes (Signed)
  Subjective:    Patient ID: Gabriel Love, male    DOB: 02/14/1941, 71 y.o.   MRN: [id number]  HPI Gabriel Love reports a two week h/o increased SOB, increased DOE. He denies any pressure sensation or weight on his chest. He has no cough, no fever, no chills. He has had no hemoptysis.   Past Medical History  Diagnosis Date  . Hyperlipidemia   . Hypertension   . Prostate hypertrophy     Benign  . Depression   . DJD (degenerative joint disease)    Past Surgical History  Procedure Date  . Circumcision, non-newborn   . Joint replacement 12/2006    total hip replacement left hip   Family History  Problem Relation Age of Onset  . Hypertension Mother   . Diabetes Mother   . Heart failure Mother     cad/mi with rupture ventricle  . Hyperlipidemia Father   . Cancer Neg Hx     colon or prostate   History   Social History  . Marital Status: Married    Spouse Name: N/A    Number of Children: N/A  . Years of Education: N/A   Occupational History  . Not on file.   Social History Main Topics  . Smoking status: Former Smoker    Quit date: 03/22/1968  . Smokeless tobacco: Not on file  . Alcohol Use: No  . Drug Use: No  . Sexually Active: Not on file   Other Topics Concern  . Not on file   Social History Narrative   Married 1965   Very supportive wifeOne daughter  45  One grandchild (girl  2002)Retired from general construction work       Review of Systems System review is negative for any constitutional, cardiac, pulmonary, GI or neuro symptoms or complaints other than as described in the HPI.     Objective:   Physical Exam Filed Vitals:   07/14/11 1320  BP: 122/78  Pulse: 95  Temp: 97 F (36.1 C)  Resp: 16   Weight: 350 lb (158.759 kg)  Gen'l- obese white man in no acute distress HEENT- full dentures, C&S clear Cor- 2+ radial pulse, RRR, no murmur, no JVD, no Carotid bruit Pulm - normal respirations, normal breath sounds without rales, or wheezes Ext - no  deformity, trace-to 1+ pedal/ankle edema.       Assessment & Plan:  SOB/DOE - new onset. He has risk factors for heart disease including HTN, obesity   Plan - 2D echo 07/15/11 at 3PM- patient aware           Continue present medications           If  Echo is normal will consider PFTs

## 2011-07-14 NOTE — Patient Instructions (Signed)
Shortness of breath with a normal exam and no sign of infection. First concern is heart function - have scheduled you for a 2 D echo of the heart tomorrow, 3 PM at [facility], [address], 3rd floor.  If this is normal, the next thing will be to check lung function.  Please continue all your present medications.

## 2011-07-14 NOTE — Telephone Encounter (Signed)
Same day abstraction. 

## 2011-07-15 ENCOUNTER — Other Ambulatory Visit: Payer: Self-pay

## 2011-07-15 ENCOUNTER — Other Ambulatory Visit (HOSPITAL_COMMUNITY): Payer: Self-pay | Admitting: Radiology

## 2011-07-15 ENCOUNTER — Ambulatory Visit (HOSPITAL_COMMUNITY): Payer: Medicare Other | Attending: Cardiovascular Disease

## 2011-07-15 DIAGNOSIS — R0600 Dyspnea, unspecified: Secondary | ICD-10-CM

## 2011-07-15 DIAGNOSIS — E785 Hyperlipidemia, unspecified: Secondary | ICD-10-CM | POA: Insufficient documentation

## 2011-07-15 DIAGNOSIS — I10 Essential (primary) hypertension: Secondary | ICD-10-CM | POA: Insufficient documentation

## 2011-07-15 DIAGNOSIS — I519 Heart disease, unspecified: Secondary | ICD-10-CM | POA: Insufficient documentation

## 2011-07-15 DIAGNOSIS — R0989 Other specified symptoms and signs involving the circulatory and respiratory systems: Secondary | ICD-10-CM | POA: Insufficient documentation

## 2011-07-15 DIAGNOSIS — R0602 Shortness of breath: Secondary | ICD-10-CM

## 2011-07-15 DIAGNOSIS — R0609 Other forms of dyspnea: Secondary | ICD-10-CM | POA: Insufficient documentation

## 2011-07-18 ENCOUNTER — Encounter: Payer: Self-pay | Admitting: Internal Medicine

## 2011-08-18 ENCOUNTER — Encounter: Payer: Self-pay | Admitting: Internal Medicine

## 2011-08-18 ENCOUNTER — Ambulatory Visit (INDEPENDENT_AMBULATORY_CARE_PROVIDER_SITE_OTHER): Payer: Medicare Other | Admitting: Internal Medicine

## 2011-08-18 VITALS — BP 138/74 | HR 84 | Temp 97.5°F | Resp 18 | Wt 348.0 lb

## 2011-08-18 DIAGNOSIS — J069 Acute upper respiratory infection, unspecified: Secondary | ICD-10-CM

## 2011-08-18 MED ORDER — AZITHROMYCIN 500 MG PO TABS: 500.0000 mg | ORAL_TABLET | Freq: Every day | ORAL | Status: AC

## 2011-08-18 MED ORDER — PREDNISONE 10 MG PO TABS: 10.0000 mg | ORAL_TABLET | Freq: Every day | ORAL | Status: DC

## 2011-08-18 NOTE — Patient Instructions (Signed)
Upper respiratory infection - looks like a bacterial infection. Plan - azithromycin 500 mg daily for 3 days; continue with a mucinex type product. Short course of prednisone 20 mg daily x 3, 10 mg daily x 6 days. Call if you have continued problems.   Upper Respiratory Infection, Adult An upper respiratory infection (URI) is also sometimes known as the common cold. The upper respiratory tract includes the nose, sinuses, throat, trachea, and bronchi. Bronchi are the airways leading to the lungs. Most people improve within 1 week, but symptoms can last up to 2 weeks. A residual cough may last even longer.   CAUSES Many different viruses can infect the tissues lining the upper respiratory tract. The tissues become irritated and inflamed and often become very moist. Mucus production is also common. A cold is contagious. You can easily spread the virus to others by oral contact. This includes kissing, sharing a glass, coughing, or sneezing. Touching your mouth or nose and then touching a surface, which is then touched by another person, can also spread the virus. SYMPTOMS   Symptoms typically develop 1 to 3 days after you come in contact with a cold virus. Symptoms vary from person to person. They may include:  Runny nose.   Sneezing.   Nasal congestion.   Sinus irritation.   Sore throat.   Loss of voice (laryngitis).   Cough.   Fatigue.   Muscle aches.   Loss of appetite.   Headache.   Low-grade fever.  DIAGNOSIS   You might diagnose your own cold based on familiar symptoms, since most people get a cold 2 to 3 times a year. Your caregiver can confirm this based on your exam. Most importantly, your caregiver can check that your symptoms are not due to another disease such as strep throat, sinusitis, pneumonia, asthma, or epiglottitis. Blood tests, throat tests, and X-rays are not necessary to diagnose a common cold, but they may sometimes be helpful in excluding other more serious  diseases. Your caregiver will decide if any further tests are required. RISKS AND COMPLICATIONS   You may be at risk for a more severe case of the common cold if you smoke cigarettes, have chronic heart disease (such as heart failure) or lung disease (such as asthma), or if you have a weakened immune system. The very young and very old are also at risk for more serious infections. Bacterial sinusitis, middle ear infections, and bacterial pneumonia can complicate the common cold. The common cold can worsen asthma and chronic obstructive pulmonary disease (COPD). Sometimes, these complications can require emergency medical care and may be life-threatening. PREVENTION   The best way to protect against getting a cold is to practice good hygiene. Avoid oral or hand contact with people with cold symptoms. Wash your hands often if contact occurs. There is no clear evidence that vitamin C, vitamin E, echinacea, or exercise reduces the chance of developing a cold. However, it is always recommended to get plenty of rest and practice good nutrition. TREATMENT   Treatment is directed at relieving symptoms. There is no cure. Antibiotics are not effective, because the infection is caused by a virus, not by bacteria. Treatment may include:  Increased fluid intake. Sports drinks offer valuable electrolytes, sugars, and fluids.   Breathing heated mist or steam (vaporizer or shower).   Eating chicken soup or other clear broths, and maintaining good nutrition.   Getting plenty of rest.   Using gargles or lozenges for comfort.   Controlling fevers  with ibuprofen or acetaminophen as directed by your caregiver.   Increasing usage of your inhaler if you have asthma.  Zinc gel and zinc lozenges, taken in the first 24 hours of the common cold, can shorten the duration and lessen the severity of symptoms. Pain medicines may help with fever, muscle aches, and throat pain. A variety of non-prescription medicines are  available to treat congestion and runny nose. Your caregiver can make recommendations and may suggest nasal or lung inhalers for other symptoms.   HOME CARE INSTRUCTIONS    Only take over-the-counter or prescription medicines for pain, discomfort, or fever as directed by your caregiver.   Use a warm mist humidifier or inhale steam from a shower to increase air moisture. This may keep secretions moist and make it easier to breathe.   Drink enough water and fluids to keep your urine clear or pale yellow.   Rest as needed.   Return to work when your temperature has returned to normal or as your caregiver advises. You may need to stay home longer to avoid infecting others. You can also use a face mask and careful hand washing to prevent spread of the virus.  SEEK MEDICAL CARE IF:    After the first few days, you feel you are getting worse rather than better.   You need your caregiver's advice about medicines to control symptoms.   You develop chills, worsening shortness of breath, or brown or red sputum. These may be signs of pneumonia.   You develop yellow or brown nasal discharge or pain in the face, especially when you bend forward. These may be signs of sinusitis.   You develop a fever, swollen neck glands, pain with swallowing, or white areas in the back of your throat. These may be signs of strep throat.  SEEK IMMEDIATE MEDICAL CARE IF:    You have a fever.   You develop severe or persistent headache, ear pain, sinus pain, or chest pain.   You develop wheezing, a prolonged cough, cough up blood, or have a change in your usual mucus (if you have chronic lung disease).   You develop sore muscles or a stiff neck.  Document Released: 09/01/2000 Document Revised: 02/25/2011 Document Reviewed: 07/10/2010 [facility] Patient Information 2012 [place], [place].

## 2011-08-18 NOTE — Progress Notes (Signed)
  Subjective:    Patient ID: Gabriel Love, male    DOB: [birth year], 71 y.o.   MRN: [id number]  HPI Gabriel Love presents with a 6 day h/o chills, cough that is non-productive, he has been short of breath and weak. No N/v. He has been taking mucinex without much relief.   Past Medical History  Diagnosis Date  . Hyperlipidemia   . Hypertension   . Prostate hypertrophy     Benign  . Depression   . DJD (degenerative joint disease)    Past Surgical History  Procedure Date  . Circumcision, non-newborn   . Joint replacement 12/2006    total hip replacement left hip   Family History  Problem Relation Age of Onset  . Hypertension Mother   . Diabetes Mother   . Heart failure Mother     cad/mi with rupture ventricle  . Hyperlipidemia Father   . Cancer Neg Hx     colon or prostate   History   Social History  . Marital Status: Married    Spouse Name: N/A    Number of Children: N/A  . Years of Education: N/A   Occupational History  . Not on file.   Social History Main Topics  . Smoking status: Former Smoker    Quit date: 03/22/1968  . Smokeless tobacco: Not on file  . Alcohol Use: No  . Drug Use: No  . Sexually Active: Not on file   Other Topics Concern  . Not on file   Social History Narrative   Married 1965   Very supportive wifeOne daughter  41  One grandchild (girl  2002)Retired from general construction work      Review of Systems System review is negative for any constitutional, cardiac, pulmonary, GI or neuro symptoms or complaints other than as described in the HPI.     Objective:   Physical Exam Filed Vitals:   08/18/11 0939  BP: 138/74  Pulse: 84  Temp: 97.5 F (36.4 C)  Resp: 18   Wt Readings from Last 3 Encounters:  08/18/11 348 lb (157.852 kg)  07/14/11 350 lb (158.759 kg)  09/17/10 339 lb (153.769 kg)   Gen'l - obese white man in no acute distress HEENT- Mild tenderness left frontal and maxillary sinus, EACs clear Neck- supple Nodes - no  adenopathy Cor - RRR Pulm - no increased WOB, coarse rhonchi noted, no wheezing. He does have some upper airway wheezing Neuro - normal        Assessment & Plan:  URI - strongly suspect bacterial origin  Plan - Azithromycin 500 mg daily x 3           Supportive care.

## 2011-09-21 ENCOUNTER — Other Ambulatory Visit: Payer: Self-pay | Admitting: Internal Medicine

## 2011-09-25 ENCOUNTER — Other Ambulatory Visit: Payer: Self-pay | Admitting: Internal Medicine

## 2011-09-26 ENCOUNTER — Other Ambulatory Visit: Payer: Self-pay | Admitting: Internal Medicine

## 2011-09-27 ENCOUNTER — Other Ambulatory Visit: Payer: Self-pay | Admitting: General Practice

## 2011-09-27 MED ORDER — LOVASTATIN 20 MG PO TABS: 20.0000 mg | ORAL_TABLET | Freq: Every day | ORAL | Status: DC

## 2011-09-27 MED ORDER — HYDROCHLOROTHIAZIDE 25 MG PO TABS: 25.0000 mg | ORAL_TABLET | Freq: Every day | ORAL | Status: DC

## 2011-10-02 ENCOUNTER — Other Ambulatory Visit: Payer: Self-pay | Admitting: Internal Medicine

## 2011-10-04 NOTE — Telephone Encounter (Signed)
Refill proscar to [facility]

## 2012-01-26 ENCOUNTER — Ambulatory Visit (INDEPENDENT_AMBULATORY_CARE_PROVIDER_SITE_OTHER): Payer: Medicare Other

## 2012-01-26 DIAGNOSIS — Z23 Encounter for immunization: Secondary | ICD-10-CM

## 2012-03-19 ENCOUNTER — Other Ambulatory Visit: Payer: Self-pay | Admitting: Internal Medicine

## 2012-06-14 ENCOUNTER — Encounter: Payer: Self-pay | Admitting: Internal Medicine

## 2012-06-14 ENCOUNTER — Ambulatory Visit (INDEPENDENT_AMBULATORY_CARE_PROVIDER_SITE_OTHER): Payer: 59 | Admitting: Internal Medicine

## 2012-06-14 VITALS — BP 150/100 | HR 94 | Temp 97.9°F | Resp 16 | Ht 71.0 in | Wt 348.0 lb

## 2012-06-14 DIAGNOSIS — M48061 Spinal stenosis, lumbar region without neurogenic claudication: Secondary | ICD-10-CM

## 2012-06-14 DIAGNOSIS — I70219 Atherosclerosis of native arteries of extremities with intermittent claudication, unspecified extremity: Secondary | ICD-10-CM

## 2012-06-14 DIAGNOSIS — M5137 Other intervertebral disc degeneration, lumbosacral region: Secondary | ICD-10-CM

## 2012-06-14 NOTE — Progress Notes (Signed)
Subjective:     Patient ID: Gabriel Love, male   DOB: 1941/02/26, 72 y.o.   MRN: [id number]  HPI Gabriel Love is an obese 72 yo gentleman with HTN and hyperlipidemia that presents with a 6 month history of pain in the LE right worse than left.  He describes the pain as a throbbing ache.  The pain has progressively moved up the legs and now stops at mid-shin.  The pain is associated with lower leg swelling and is better when he lays down at night and worsens throughout the day.  He especially notes pain when he walks and causes him to stumble saying sometimes he can't get his foot up.  When walking he endorses a heavy leaden feeling in his legs that gets better with rest. Currently he can only walk about 100 feet before needing to rest.  He also notes a bruise on his right ankle sustained 2 weeks ago during a charity house build wear he was struck with a piece of iron that will not go away.  He denies smoking but was a former smoker.  Past Medical History  Diagnosis Date  . Hyperlipidemia   . Hypertension   . Prostate hypertrophy     Benign  . Depression   . DJD (degenerative joint disease)    Past Surgical History  Procedure Laterality Date  . Circumcision, non-newborn    . Joint replacement  12/2006    total hip replacement left hip   Family History  Problem Relation Age of Onset  . Hypertension Mother   . Diabetes Mother   . Heart failure Mother     cad/mi with rupture ventricle  . Hyperlipidemia Father   . Cancer Neg Hx     colon or prostate   History   Social History  . Marital Status: Married    Spouse Name: N/A    Number of Children: N/A  . Years of Education: N/A   Occupational History  . Not on file.   Social History Main Topics  . Smoking status: Former Smoker    Quit date: 03/22/1968  . Smokeless tobacco: Not on file  . Alcohol Use: No  . Drug Use: No  . Sexually Active: Not on file   Other Topics Concern  . Not on file   Social History Narrative   Married  1965   Very supportive wife      One daughter  75  One grandchild (girl  2002)   Retired from [employer] work         Current Outpatient Prescriptions on File Prior to Visit  Medication Sig Dispense Refill  . aspirin 325 MG EC tablet Take 325 mg by mouth daily.        . finasteride (PROSCAR) 5 MG tablet TAKE 1 TABLET BY MOUTH ONCE DAILY  90 tablet  3  . hydrochlorothiazide (HYDRODIURIL) 25 MG tablet Take 1 tablet (25 mg total) by mouth daily.  90 tablet  3  . LEXAPRO 10 MG tablet TAKE 1 TABLET BY MOUTH ONCE DAILY  30 each  5  . lovastatin (MEVACOR) 20 MG tablet Take 1 tablet (20 mg total) by mouth daily.  30 tablet  11  . Tamsulosin HCl (FLOMAX) 0.4 MG CAPS TAKE 1 CAPSULE BY MOUTH ONCE DAILY  90 capsule  1  . vitamin C (ASCORBIC ACID) 500 MG tablet Take 500 mg by mouth daily.       No current facility-administered medications on file prior to  visit.   Review of Systems  Constitutional: Negative for fever and chills.  HENT: Positive for hearing loss.   Eyes: Negative for visual disturbance.  Respiratory: Negative for cough and shortness of breath.   Cardiovascular: Positive for leg swelling. Negative for chest pain.  Gastrointestinal: Negative for nausea, vomiting, abdominal pain and diarrhea.  Endocrine: Positive for polydipsia.  Genitourinary: Positive for urgency, frequency and difficulty urinating.       Has had issues with urinary incontinence  Skin: Positive for color change (feet and lower legs have become red).       Skin of the legs has also become shiny and hair has stopped growing.  Neurological: Positive for weakness (in the feet, has been stumbling) and numbness (in the feet). Negative for headaches.      Objective:   Physical Exam Filed Vitals:   06/14/12 1622  BP: 150/100 repeat BP after pt had rested was 150/96  Pulse: 94  Temp: 97.9 F (36.6 C)  Resp: 16  SpO2: 96% General: morbidly obese male resting comfortably in a chair in NAD HEENT: [place]/AT,  EOMI, oropharynx is clean and non-erythematous CV: RRR, no m/r/g, normal S1/S2 Pulm: CTA bilaterally, no wheezes, rales, or rhonci auscultate Abd: normal BS, obese, soft, non-distended, non-tender Extremities: both feet are quite cold right worse than left, 2+ radial pulses bilaterally, 1+ DP and PT pulses bilaterally, 8 second capillary refill of right foot, 4-5 second capillary refill in left foot; 1+ pitting edema to mid-shin bilaterally, leg appears shiny and has very little hair compared to the upper extremities. Neuro:  Back Exam - pt unable to stand from sitting unassisted, can bend at the waist without pain, difficulty standing on ball of right foot, slight right foot drop with ambulation, difficulty heel walking, pain with ambulation, 1+ left patellar/achilles reflex, unable to elicit reflexes (patellar or achilles) in right leg.  Foot Exam - no ulcers/sores appreciated, decreased light touch and vibration sense in the distal foot Psych: mood/affect appropriate for interaction     Assessment/Plan:  Spinal Stenosis and PAD  Patient interviewed and examined. I agree with the assessment and plan as documented by Mr. Lucretia Roers, MS III

## 2012-06-14 NOTE — Assessment & Plan Note (Addendum)
Assessment: symptoms of claudication requiring rest after 100 ', weak pulses, cold feet, decreased hair growth, and delayed capillary refill all support the dx of PAD.  Plan: LE Arterial Doppler study  If positive study referral to cardiology for further evaluation. -

## 2012-06-14 NOTE — Assessment & Plan Note (Signed)
Assessment: pt's pain/weakness and back exam are consistent with neuropathic pain likely due to pt's spinal stenosis. Plan: - MRI to assess spinal stenosis for progression - refer pt to a neurosurgeon for further assessment

## 2012-06-14 NOTE — Patient Instructions (Addendum)
1. Pain in the right leg along with weakness - can't stand on toes and with foot drop. Concern for progressive disk disease and spinal stenosis which was seen in '11. Plan Repeat MRI lumbar spine  Referral to Dr. Barnett Abu, [facility].  2. Cold leg, right greater than left, with claudication (that leaden leg feeling with walking) that suggests poor arterial circulation - PAD Plan Lower extremity arterial doppler study.  If this is abnormal will refer to an interventional cardiologist - who will decide on further testing, i.e. Angiogram to determine if this can be approached  With catherization and angioplasty with stenting. If there is a problem that cannot be approached this way a referral to a vascular surgeon.  Peripheral Vascular Disease Peripheral Vascular Disease (PVD), also called Peripheral Arterial Disease (PAD), is a circulation problem caused by cholesterol (atherosclerotic plaque) deposits in the arteries. PVD commonly occurs in the lower extremities (legs) but it can occur in other areas of the body, such as your arms. The cholesterol buildup in the arteries reduces blood flow which can cause pain and other serious problems. The presence of PVD can place a person at risk for Coronary Artery Disease (CAD).  CAUSES  Causes of PVD can be many. It is usually associated with more than one risk factor such as:   High Cholesterol.  Smoking.  Diabetes.  Lack of exercise or inactivity.  High blood pressure (hypertension).  Obesity.  Family history. SYMPTOMS   When the lower extremities are affected, patients with PVD may experience:  Leg pain with exertion or physical activity. This is called INTERMITTENT CLAUDICATION. This may present as cramping or numbness with physical activity. The location of the pain is associated with the level of blockage. For example, blockage at the abdominal level (distal abdominal aorta) may result in buttock or hip pain. Lower leg arterial  blockage may result in calf pain.  As PVD becomes more severe, pain can develop with less physical activity.  In people with severe PVD, leg pain may occur at rest.  Other PVD signs and symptoms:  Leg numbness or weakness.  Coldness in the affected leg or foot, especially when compared to the other leg.  A change in leg color.  Patients with significant PVD are more prone to ulcers or sores on toes, feet or legs. These may take longer to heal or may reoccur. The ulcers or sores can become infected.  If signs and symptoms of PVD are ignored, gangrene may occur. This can result in the loss of toes or loss of an entire limb.  Not all leg pain is related to PVD. Other medical conditions can cause leg pain such as:  Blood clots (embolism) or Deep Vein Thrombosis.  Inflammation of the blood vessels (vasculitis).  Spinal stenosis. DIAGNOSIS  Diagnosis of PVD can involve several different types of tests. These can include:  Pulse Volume Recording Method (PVR). This test is simple, painless and does not involve the use of X-rays. PVR involves measuring and comparing the blood pressure in the arms and legs. An ABI (Ankle-Brachial Index) is calculated. The normal ratio of blood pressures is 1. As this number becomes smaller, it indicates more severe disease.  < 0.95  indicates significant narrowing in one or more leg vessels.  <0.8 there will usually be pain in the foot, leg or buttock with exercise.  <0.4 will usually have pain in the legs at rest.  <0.25  usually indicates limb threatening PVD.  Doppler detection of  pulses in the legs. This test is painless and checks to see if you have a pulses in your legs/feet.  A dye or contrast material (a substance that highlights the blood vessels so they show up on x-ray) may be given to help your caregiver better see the arteries for the following tests. The dye is eliminated from your body by the kidney's. Your caregiver may order blood work  to check your kidney function and other laboratory values before the following tests are performed:  Magnetic Resonance Angiography (MRA). An MRA is a picture study of the blood vessels and arteries. The MRA machine uses a large magnet to produce images of the blood vessels.  Computed Tomography Angiography (CTA). A CTA is a specialized x-ray that looks at how the blood flows in your blood vessels. An IV may be inserted into your arm so contrast dye can be injected.  Angiogram. Is a procedure that uses x-rays to look at your blood vessels. This procedure is minimally invasive, meaning a small incision (cut) is made in your groin. A small tube (catheter) is then inserted into the artery of your groin. The catheter is guided to the blood vessel or artery your caregiver wants to examine. Contrast dye is injected into the catheter. X-rays are then taken of the blood vessel or artery. After the images are obtained, the catheter is taken out. TREATMENT  Treatment of PVD involves many interventions which may include:  Lifestyle changes:  Quitting smoking.  Exercise.  Following a low fat, low cholesterol diet.  Control of diabetes.  Foot care is very important to the PVD patient. Good foot care can help prevent infection.  Medication:  Cholesterol-lowering medicine.  Blood pressure medicine.  Anti-platelet drugs.  Certain medicines may reduce symptoms of Intermittent Claudication.  Interventional/Surgical options:  Angioplasty. An Angioplasty is a procedure that inflates a balloon in the blocked artery. This opens the blocked artery to improve blood flow.  Stent Implant. A wire mesh tube (stent) is placed in the artery. The stent expands and stays in place, allowing the artery to remain open.  Peripheral Bypass Surgery. This is a surgical procedure that reroutes the blood around a blocked artery to help improve blood flow. This type of procedure may be performed if Angioplasty or stent  implants are not an option. SEEK IMMEDIATE MEDICAL CARE IF:   You develop pain or numbness in your arms or legs.  Your arm or leg turns cold, becomes blue in color.  You develop redness, warmth, swelling and pain in your arms or legs. MAKE SURE YOU:   Understand these instructions.  Will watch your condition.  Will get help right away if you are not doing well or get worse. Document Released: 04/15/2004 Document Revised: 05/31/2011 Document Reviewed: 03/12/2008 [facility] Patient Information 2013 [place], [place].    Spinal Stenosis One cause of back pain is spinal stenosis. Stenosis means abnormal narrowing. The spinal canal contains and protects the spinal nerve roots. In spinal stenosis, the spinal canal narrows and pinches the spinal cord and nerves. This causes low back pain and pain in the legs. Stenosis may pinch the nerves that control muscles and sensation in the legs. This leads to pain and abnormal feelings in the leg muscles and areas supplied by those nerves. CAUSES  Spinal stenosis often happens to people as they get older and arthritic boney growths occur in their spinal canal. There is also a loss of the disk height between the bones of the back, which  also adds to this problem. Sometimes the problem is present at birth. SYMPTOMS   Pain that is generally worse with activities, particularly standing and walking.  Numbness, tingling, hot or cold feelings, weakness, or a weariness in the legs.  Clumsiness, frequent falling, and a foot-slapping gait, which may come as a result of nerve pressure and muscle weakness. DIAGNOSIS   Your caregiver may suspect spinal stenosis if you have unusual leg symptoms, such as those previously mentioned.  Your orthopedic surgeon may request special imaging exams, such a computerized magnetic scan (MRI) or computerized X-ray scan (CT) to find out the cause of the problem. TREATMENT   Sometimes treatments such as postural changes or  nonsteroidal anti-inflammatory drugs will relieve the pain.  Nonsteroidal anti-inflammatory medications may help relieve symptoms. These medicines do this by decreasing swelling and inflammation in the nerves.  When stenosis causes severe nerve root compression, conservative treatment may not be enough to maintain a normal lifestyle. Surgery may be recommended to relieve the pressure on affected nerves. In properly selected patients, the results are very good, and patients are able to continue a normal lifestyle. HOME CARE INSTRUCTIONS   Flexing the spine by leaning forward while walking may relieve symptoms. Lying with the knees drawn up to the chest may offer some relief. These positions enlarge the space available to the nerves. They may make it easier for stenosis sufferers to walk longer distances.  Rest, followed by gradually resuming activity, also can help.  Aerobic activity, such as bicycling or swimming, is often recommended.  Losing weight can also relieve some of the load on the spine.  Application of warm or cold compresses to the area of pain can be helpful. SEEK MEDICAL CARE IF:   The periods of relief between episodes of pain become shorter and shorter.  You experience pain that radiates down your leg, even when you are not standing or walking. SEEK IMMEDIATE MEDICAL CARE IF:   You have a loss of bowel or bladder control.  You have a sudden loss of feeling in your legs.  You suddenly cannot move your legs. Document Released: 05/29/2003 Document Revised: 05/31/2011 Document Reviewed: 07/24/2009 [facility] Patient Information 2013 [place], [place].

## 2012-06-21 ENCOUNTER — Ambulatory Visit
Admission: RE | Admit: 2012-06-21 | Discharge: 2012-06-21 | Disposition: A | Discharge status: Home / Self Care | Payer: Medicare Other | Source: Ambulatory Visit | Attending: Internal Medicine | Admitting: Internal Medicine

## 2012-06-21 DIAGNOSIS — M5137 Other intervertebral disc degeneration, lumbosacral region: Secondary | ICD-10-CM

## 2012-06-25 ENCOUNTER — Encounter: Payer: Self-pay | Admitting: Internal Medicine

## 2012-06-27 ENCOUNTER — Encounter (INDEPENDENT_AMBULATORY_CARE_PROVIDER_SITE_OTHER): Payer: Medicare Other

## 2012-06-27 DIAGNOSIS — I70219 Atherosclerosis of native arteries of extremities with intermittent claudication, unspecified extremity: Secondary | ICD-10-CM

## 2012-06-27 DIAGNOSIS — I739 Peripheral vascular disease, unspecified: Secondary | ICD-10-CM

## 2012-06-28 ENCOUNTER — Telehealth: Payer: Self-pay

## 2012-06-28 DIAGNOSIS — E785 Hyperlipidemia, unspecified: Secondary | ICD-10-CM

## 2012-06-28 DIAGNOSIS — R739 Hyperglycemia, unspecified: Secondary | ICD-10-CM

## 2012-06-28 DIAGNOSIS — I10 Essential (primary) hypertension: Secondary | ICD-10-CM

## 2012-06-28 NOTE — Telephone Encounter (Signed)
Pt's spouse called requesting labs be ordered for labs, specifically to check for Diabetes. Pt has been complaining of increased thirst. Please advise, can CPE labs be ordered for pt?

## 2012-06-28 NOTE — Telephone Encounter (Signed)
Orders entered

## 2012-06-29 NOTE — Telephone Encounter (Signed)
Pt advised that orders were entered per [facility] as requested via VM

## 2012-07-03 ENCOUNTER — Encounter: Payer: Self-pay | Admitting: Internal Medicine

## 2012-07-04 ENCOUNTER — Other Ambulatory Visit (INDEPENDENT_AMBULATORY_CARE_PROVIDER_SITE_OTHER): Payer: Medicare Other

## 2012-07-04 DIAGNOSIS — R739 Hyperglycemia, unspecified: Secondary | ICD-10-CM

## 2012-07-04 DIAGNOSIS — E785 Hyperlipidemia, unspecified: Secondary | ICD-10-CM

## 2012-07-04 DIAGNOSIS — R7309 Other abnormal glucose: Secondary | ICD-10-CM

## 2012-07-04 DIAGNOSIS — I10 Essential (primary) hypertension: Secondary | ICD-10-CM

## 2012-07-04 LAB — HEMOGLOBIN A1C: Hgb A1c MFr Bld: 5.8 % (ref 4.6–6.5)

## 2012-07-04 LAB — HEPATIC FUNCTION PANEL
Albumin: 3.8 g/dL (ref 3.5–5.2)
Alkaline Phosphatase: 59 U/L (ref 39–117)
Total Protein: 7 g/dL (ref 6.0–8.3)

## 2012-07-04 LAB — COMPREHENSIVE METABOLIC PANEL
ALT: 21 U/L (ref 0–53)
AST: 19 U/L (ref 0–37)
Albumin: 3.8 g/dL (ref 3.5–5.2)
CO2: 30 mEq/L (ref 19–32)
Calcium: 8.5 mg/dL (ref 8.4–10.5)
Chloride: 100 mEq/L (ref 96–112)
Creatinine, Ser: 0.7 mg/dL (ref 0.4–1.5)
Potassium: 3.8 mEq/L (ref 3.5–5.1)
Sodium: 138 mEq/L (ref 135–145)
Total Protein: 7 g/dL (ref 6.0–8.3)

## 2012-07-04 LAB — LIPID PANEL
Cholesterol: 186 mg/dL (ref 0–200)
VLDL: 30 mg/dL (ref 0.0–40.0)

## 2012-07-05 ENCOUNTER — Telehealth: Payer: Self-pay | Admitting: Internal Medicine

## 2012-07-05 ENCOUNTER — Other Ambulatory Visit: Payer: Self-pay | Admitting: Internal Medicine

## 2012-07-05 NOTE — Telephone Encounter (Signed)
Does not appear in EMR that pt went to [facility] as advised for venous doppler Please call pt/wife - did he go to UC? IF venou doppler not done in past 48h, please let me know and i will order same - This is a different test than the arterial doppler leg scan that was done by [facility] earlier this month Thanks!

## 2012-07-05 NOTE — Telephone Encounter (Signed)
Wife notified, will call back if her husband has any further problems

## 2012-07-05 NOTE — Telephone Encounter (Signed)
Wife called back, husband is requesting pain pills to help with the pain in his legs until he can be seen at [facility] in May

## 2012-07-05 NOTE — Telephone Encounter (Signed)
Per letter from [facility] sent 07/03/12:  Dear Gabriel Love,  Below are the results from your recent lower extremity doppler:  Normal circulation. Need to have the back evaluation.

## 2012-07-05 NOTE — Telephone Encounter (Signed)
[facility] Triage Call Report Triage Record Num: [id number] Operator: Aundra Millet Patient Name: Gabriel Love Call Date & Time: 07/04/2012 5:02:16PM Patient Phone: [phone] PCP: Illene Regulus Patient Gender: Male PCP Fax : [phone] Patient DOB: 09-27-1940 Practice Name: [facility] Reason for Call: Caller: Nancy/Spouse; PCP: Illene Regulus (Adults only); CB#: [phone]; Call regarding Radiology Results; Today, 07/04/2012 , Wife calling stating pt had doppler study on both legs on 06/27/2012 for bilateral leg pain. Pt is still having leg pain and worsening "throbbing" pain both calves. Pain lessens with elevation and heat pad. Has also had reddness and swelling in legs since OV 06/14/2012, but left leg is more swollen than right leg.Pt has been taking Aleve but not helpful with pain. RN reached Call Provider Immediately for worsening one sided leg swelling / pain worsens with standing/ walking per Leg - Non injury protocol. RN advised to be seen now at [facility] per nursing judgement, and call office tomorrrow about Doppler results . Protocol(s) Used: Leg Non-Injury Recommended Outcome per Protocol: See ED Immediately Override Outcome if Used in Protocol: See Provider within 4 hours RN Reason for Override Outcome: Nursing Judgement Used. Reason for Outcome: New or worsening one-sided leg swelling with pain that may be described as achy; pain may worsen with standing or walking. Care Advice: ~ Do not massage affected area if red, swollen, warm, or tender to touch OR if history of blood clots. Call EMS 911 if loss of consciousness, struggling to breathe, experiences new confusion or extreme drowsiness, change in skin color, or has chest pain or discomfort lasting 5 minutes or more. ~Position affected part so it is elevated at least 12 inches (30 cm) above level of heart to improve circulation and decrease discomfort. ~If you develop sudden onset of shortness of  breath, chest pain and cough with blood tinged sputum have another adult take you to the ED immediately.

## 2012-07-05 NOTE — Telephone Encounter (Signed)
Spoke with patients wife, patients leg pain stopped so they did not go to the UC, they are still waiting to heart the results from his test last Tuesday and would like these results before having any other tests done, requesting call back with results

## 2012-07-05 NOTE — Telephone Encounter (Signed)
Ok - Norco 5/325  1 tab q6h prn pain - Disp #60, no refills thanks

## 2012-07-06 MED ORDER — HYDROCODONE-ACETAMINOPHEN 5-325 MG PO TABS: 1.0000 | ORAL_TABLET | Freq: Four times a day (QID) | ORAL | Status: DC | PRN

## 2012-07-06 NOTE — Telephone Encounter (Signed)
Called wife no answer LMOM rx sent to [facility] in [place]...Raechel Chute

## 2012-07-11 ENCOUNTER — Telehealth: Payer: Self-pay

## 2012-07-11 NOTE — Telephone Encounter (Signed)
Done hardcopy to robin to handle

## 2012-07-11 NOTE — Telephone Encounter (Signed)
Called the patients wife informed will send copy to their home.

## 2012-07-11 NOTE — Telephone Encounter (Signed)
Pt's spouse called requesting results of pt las labs, please advise

## 2012-07-17 ENCOUNTER — Encounter: Payer: Self-pay | Admitting: Internal Medicine

## 2012-09-12 ENCOUNTER — Other Ambulatory Visit: Payer: Self-pay | Admitting: Internal Medicine

## 2012-09-23 ENCOUNTER — Other Ambulatory Visit: Payer: Self-pay | Admitting: Internal Medicine

## 2012-09-24 ENCOUNTER — Other Ambulatory Visit: Payer: Self-pay | Admitting: Internal Medicine

## 2012-11-28 ENCOUNTER — Ambulatory Visit (INDEPENDENT_AMBULATORY_CARE_PROVIDER_SITE_OTHER): Payer: PRIVATE HEALTH INSURANCE | Admitting: Internal Medicine

## 2012-11-28 ENCOUNTER — Encounter: Payer: Self-pay | Admitting: Internal Medicine

## 2012-11-28 VITALS — BP 138/84 | HR 77 | Temp 96.8°F | Resp 16 | Wt 336.0 lb

## 2012-11-28 DIAGNOSIS — E669 Obesity, unspecified: Secondary | ICD-10-CM

## 2012-11-28 DIAGNOSIS — Z23 Encounter for immunization: Secondary | ICD-10-CM

## 2012-11-28 DIAGNOSIS — M5137 Other intervertebral disc degeneration, lumbosacral region: Secondary | ICD-10-CM

## 2012-11-28 DIAGNOSIS — F329 Major depressive disorder, single episode, unspecified: Secondary | ICD-10-CM

## 2012-11-28 MED ORDER — WELLBUTRIN SR 150 MG PO TB12: 150.0000 mg | ORAL_TABLET | Freq: Every morning | ORAL | Status: DC

## 2012-11-28 NOTE — Patient Instructions (Addendum)
Leg weakness - exam reveals inability to toe/heel walk, could not step up to exam table - all this indicates weakness in the leg that is most likely related to spinal stenosis in the lumbar spine. There is no pain and I would not recommend any surgical intervention at this time.  Plan Be thoughtful and careful about moving, especially stepping up.  For increased weakness or if you develop pain - return to Dr. Danielle Dess for reevaluation.   Depression - increased sadness and tearfulness. Clearly there is something emotional going on - a sense of sadness that you can't do as much as you want or that you cannot be as helpful as you used to be.  Plan Talking therapy is the best way to get better: understand your feelings and learn better ways of coping   Medication - we will change from Lexapro to Wellbutrin SR once day. You will take both medications for 5 days then stop the Lexapro

## 2012-11-28 NOTE — Progress Notes (Signed)
Subjective:    Patient ID: Gabriel Love, male    DOB: 1940-09-14, 72 y.o.   MRN: [id number]  HPI He has had intermittent weakness of the right leg, no paresthesia. He has had multiple falls where he will trip over his own feet. Reviewed Chart - last MRI April '14 with multi-level disk disease with spinal stenosis. Reviewed Dr. Verlee Rossetti not April 23, '14: recommend ESI but felt he may have progressive disease over time that might lead to a need for surgery. At this time Gabriel Love is not having any pain.   Per wife's and patient's report he is having "crying" spells and symptoms of depression no longer well controlled by Lexapro. Past Medical History  Diagnosis Date  . Hyperlipidemia   . Hypertension   . Prostate hypertrophy     Benign  . Depression   . DJD (degenerative joint disease)    Past Surgical History  Procedure Laterality Date  . Circumcision, non-newborn    . Joint replacement  12/2006    total hip replacement left hip   Family History  Problem Relation Age of Onset  . Hypertension Mother   . Diabetes Mother   . Heart failure Mother     cad/mi with rupture ventricle  . Hyperlipidemia Father   . Cancer Neg Hx     colon or prostate   History   Social History  . Marital Status: Married    Spouse Name: N/A    Number of Children: N/A  . Years of Education: N/A   Occupational History  . Not on file.   Social History Main Topics  . Smoking status: Former Smoker    Quit date: 03/22/1968  . Smokeless tobacco: Not on file  . Alcohol Use: No  . Drug Use: No  . Sexual Activity: Not on file   Other Topics Concern  . Not on file   Social History Narrative   Married 1965   Very supportive wife      One daughter  65  One grandchild (girl  2002)   Retired from [employer] work          Current Outpatient Prescriptions on File Prior to Visit  Medication Sig Dispense Refill  . aspirin 325 MG EC tablet Take 325 mg by mouth daily.        . finasteride  (PROSCAR) 5 MG tablet TAKE 1 TABLET BY MOUTH ONCE DAILY  90 tablet  3  . hydrochlorothiazide (HYDRODIURIL) 25 MG tablet take 1 tablet once daily  90 tablet  3  . LEXAPRO 10 MG tablet take 1 tablet by mouth once daily  30 tablet  5  . lovastatin (MEVACOR) 20 MG tablet take 1 tablet by mouth once daily  30 tablet  11  . Tamsulosin HCl (FLOMAX) 0.4 MG CAPS TAKE 1 CAPSULE BY MOUTH ONCE DAILY  90 capsule  1  . vitamin C (ASCORBIC ACID) 500 MG tablet Take 500 mg by mouth daily.       No current facility-administered medications on file prior to visit.      Review of Systems System review is negative for any constitutional, cardiac, pulmonary, GI or neuro symptoms or complaints other than as described in the HPI.]     Objective:   Physical Exam Filed Vitals:   11/28/12 0913  BP: 138/84  Pulse: 77  Temp: 96.8 F (36 C)  Resp: 16   Wt Readings from Last 3 Encounters:  11/28/12 336 lb (152.409 kg)  06/14/12 348 lb (157.852 kg)  08/18/11 348 lb (157.852 kg)   BP Readings from Last 3 Encounters:  11/28/12 138/84  06/14/12 150/100  08/18/11 138/74   Gen'l- very large man in no distress who does become tearful during the exam HEENT- C&S clear Cor - 2+ radial pulse, RRR Pulm - normal respirations Back exam: normal stand with great effort; normal flex to greater than 100 degrees; normal gait; unable to toe/heel walk; unable to step up to exam table using right leg, able to step up with left leg but difficult; normal SLR sitting; normal DTRs at the patellar tendons; normal sensation to light touch, pin-prick and deep vibratory stimulus; no  CVA tenderness; able to move supine to sitting without assistance, but with difficulty.        Assessment & Plan:

## 2012-11-29 NOTE — Assessment & Plan Note (Signed)
Body mass index is 46.88 kg/(m^2).\  He is aware of the major threat nature of his weight and he is working on [employer]. He has lost 12 lbs in approx 6 months.  Plan Continue weight management: Diet management: smart food choices, PORTION SIZE CONTROL, regular exercise. Goal - to loose 1-2 lbs.month. Target weight - 220 lbs

## 2012-11-29 NOTE — Assessment & Plan Note (Signed)
Gabriel Love with right leg weakness, but not true foot drop. He is pain free. He is not a surgical candidate at this time based on his exam.  Plan Attention to walking and especially to stepping up stairs, etc.  For increasing weakness, paresthesia or pain will need to reconsult with Dr. Danielle Dess.

## 2012-11-29 NOTE — Assessment & Plan Note (Signed)
Patient with clear signs of depression most likely related to his decreased functionality related to his multiple medical problems. Discussed the benefit of therapy but he is unwilling to consider this as an option at this time  Plan D/c lexapro  Start wellbutrin SR 150 mg q AM

## 2012-12-24 ENCOUNTER — Emergency Department (HOSPITAL_COMMUNITY)
Admission: EM | Admit: 2012-12-24 | Discharge: 2012-12-24 | Disposition: A | Discharge status: Home / Self Care | Payer: PRIVATE HEALTH INSURANCE | Attending: Emergency Medicine | Admitting: Emergency Medicine

## 2012-12-24 ENCOUNTER — Emergency Department (HOSPITAL_COMMUNITY): Payer: PRIVATE HEALTH INSURANCE

## 2012-12-24 ENCOUNTER — Encounter (HOSPITAL_COMMUNITY): Payer: Self-pay | Admitting: *Deleted

## 2012-12-24 DIAGNOSIS — N452 Orchitis: Secondary | ICD-10-CM

## 2012-12-24 DIAGNOSIS — N453 Epididymo-orchitis: Secondary | ICD-10-CM | POA: Insufficient documentation

## 2012-12-24 DIAGNOSIS — I10 Essential (primary) hypertension: Secondary | ICD-10-CM | POA: Insufficient documentation

## 2012-12-24 DIAGNOSIS — Z7982 Long term (current) use of aspirin: Secondary | ICD-10-CM | POA: Insufficient documentation

## 2012-12-24 DIAGNOSIS — N400 Enlarged prostate without lower urinary tract symptoms: Secondary | ICD-10-CM | POA: Insufficient documentation

## 2012-12-24 DIAGNOSIS — N390 Urinary tract infection, site not specified: Secondary | ICD-10-CM | POA: Insufficient documentation

## 2012-12-24 DIAGNOSIS — E785 Hyperlipidemia, unspecified: Secondary | ICD-10-CM | POA: Insufficient documentation

## 2012-12-24 DIAGNOSIS — Z880 Allergy status to penicillin: Secondary | ICD-10-CM | POA: Insufficient documentation

## 2012-12-24 DIAGNOSIS — Z87891 Personal history of nicotine dependence: Secondary | ICD-10-CM | POA: Insufficient documentation

## 2012-12-24 DIAGNOSIS — F329 Major depressive disorder, single episode, unspecified: Secondary | ICD-10-CM | POA: Insufficient documentation

## 2012-12-24 DIAGNOSIS — Z79899 Other long term (current) drug therapy: Secondary | ICD-10-CM | POA: Insufficient documentation

## 2012-12-24 DIAGNOSIS — M1990 Unspecified osteoarthritis, unspecified site: Secondary | ICD-10-CM | POA: Insufficient documentation

## 2012-12-24 DIAGNOSIS — F3289 Other specified depressive episodes: Secondary | ICD-10-CM | POA: Insufficient documentation

## 2012-12-24 LAB — URINALYSIS, ROUTINE W REFLEX MICROSCOPIC
Bilirubin Urine: NEGATIVE
Glucose, UA: NEGATIVE mg/dL
Ketones, ur: 15 mg/dL — AB
Nitrite: POSITIVE — AB
Protein, ur: 30 mg/dL — AB
Specific Gravity, Urine: 1.026 (ref 1.005–1.030)
Urobilinogen, UA: 1 mg/dL (ref 0.0–1.0)
pH: 5.5 (ref 5.0–8.0)

## 2012-12-24 LAB — CBC WITH DIFFERENTIAL/PLATELET
Basophils Absolute: 0 10*3/uL (ref 0.0–0.1)
Basophils Relative: 0 % (ref 0–1)
Eosinophils Absolute: 0.1 10*3/uL (ref 0.0–0.7)
Eosinophils Relative: 1 % (ref 0–5)
HCT: 42.7 % (ref 39.0–52.0)
Hemoglobin: 14.8 g/dL (ref 13.0–17.0)
Lymphocytes Relative: 12 % (ref 12–46)
Lymphs Abs: 2 10*3/uL (ref 0.7–4.0)
MCH: 30.3 pg (ref 26.0–34.0)
MCHC: 34.7 g/dL (ref 30.0–36.0)
MCV: 87.3 fL (ref 78.0–100.0)
Monocytes Absolute: 1.1 10*3/uL — ABNORMAL HIGH (ref 0.1–1.0)
Monocytes Relative: 7 % (ref 3–12)
Neutro Abs: 13.2 10*3/uL — ABNORMAL HIGH (ref 1.7–7.7)
Neutrophils Relative %: 80 % — ABNORMAL HIGH (ref 43–77)
Platelets: 189 10*3/uL (ref 150–400)
RBC: 4.89 MIL/uL (ref 4.22–5.81)
RDW: 13.9 % (ref 11.5–15.5)
WBC: 16.5 10*3/uL — ABNORMAL HIGH (ref 4.0–10.5)

## 2012-12-24 LAB — BASIC METABOLIC PANEL
BUN: 16 mg/dL (ref 6–23)
CO2: 30 mEq/L (ref 19–32)
Calcium: 8.3 mg/dL — ABNORMAL LOW (ref 8.4–10.5)
Chloride: 99 mEq/L (ref 96–112)
Creatinine, Ser: 0.79 mg/dL (ref 0.50–1.35)
GFR calc Af Amer: >90 mL/min (ref >90)
GFR calc non Af Amer: 87 mL/min — ABNORMAL LOW (ref >90)
Glucose, Bld: 103 mg/dL — ABNORMAL HIGH (ref 70–99)
Potassium: 3.3 mEq/L — ABNORMAL LOW (ref 3.5–5.1)
Sodium: 140 mEq/L (ref 135–145)

## 2012-12-24 LAB — URINE MICROSCOPIC-ADD ON

## 2012-12-24 LAB — LACTIC ACID, PLASMA: Lactic Acid, Venous: 0.9 mmol/L (ref 0.5–2.2)

## 2012-12-24 MED ORDER — SODIUM CHLORIDE 0.9 % IV SOLN
1.0000 g | Freq: Once | INTRAVENOUS | Status: DC
  Filled 2012-12-24: qty 1

## 2012-12-24 MED ORDER — SODIUM CHLORIDE 0.9 % IV BOLUS (SEPSIS)
1000.0000 mL | Freq: Once | INTRAVENOUS | Status: AC
  Administered 2012-12-24: 1000 mL via INTRAVENOUS

## 2012-12-24 MED ORDER — CLINDAMYCIN PHOSPHATE 600 MG/50ML IV SOLN
600.0000 mg | Freq: Once | INTRAVENOUS | Status: AC
  Administered 2012-12-24: 600 mg via INTRAVENOUS
  Filled 2012-12-24: qty 50

## 2012-12-24 MED ORDER — CIPROFLOXACIN IN D5W 400 MG/200ML IV SOLN
400.0000 mg | Freq: Once | INTRAVENOUS | Status: AC
  Administered 2012-12-24: 400 mg via INTRAVENOUS
  Filled 2012-12-24: qty 200

## 2012-12-24 MED ORDER — VANCOMYCIN HCL IN DEXTROSE 1-5 GM/200ML-% IV SOLN: 1000.0000 mg | Freq: Once | INTRAVENOUS | Status: DC

## 2012-12-24 MED ORDER — CIPROFLOXACIN HCL 500 MG PO TABS: 500.0000 mg | ORAL_TABLET | Freq: Two times a day (BID) | ORAL | Status: DC

## 2012-12-24 NOTE — ED Provider Notes (Signed)
CSN: [id number]     Arrival date & time 12/24/12  1120 History   First MD Initiated Contact with Patient 12/24/12 1134     No chief complaint on file.  (Consider location/radiation/quality/duration/timing/severity/associated sxs/prior Treatment) HPI  72 year old male with testicular pain and swelling. Gradual onset about 2 days ago. Progressively worsening. Pain is worse when touching area and movement. Denies trauma. Patient describes a soreness that is worse in the right testicle. No dysuria but has had incontinence. No abdominal or back pain. No fever or chills. Nausea. No vomiting. No recent urologic procedures.    Past Medical History  Diagnosis Date  . Hyperlipidemia   . Hypertension   . Prostate hypertrophy     Benign  . Depression   . DJD (degenerative joint disease)    Past Surgical History  Procedure Laterality Date  . Circumcision, non-newborn    . Joint replacement  12/2006    total hip replacement left hip   Family History  Problem Relation Age of Onset  . Hypertension Mother   . Diabetes Mother   . Heart failure Mother     cad/mi with rupture ventricle  . Hyperlipidemia Father   . Cancer Neg Hx     colon or prostate   History  Substance Use Topics  . Smoking status: Former Smoker    Quit date: 03/22/1968  . Smokeless tobacco: Not on file  . Alcohol Use: No    Review of Systems  All systems reviewed and negative, other than as noted in HPI.   Allergies  Ivp dye and Penicillins  Home Medications   Current Outpatient Rx  Name  Route  Sig  Dispense  Refill  . aspirin 325 MG EC tablet   Oral   Take 325 mg by mouth daily.           . finasteride (PROSCAR) 5 MG tablet   Oral   Take 5 mg by mouth at bedtime.          . hydrochlorothiazide (HYDRODIURIL) 25 MG tablet      take 1 tablet once daily   90 tablet   3   . LEXAPRO 10 MG tablet      take 1 tablet by mouth once daily   30 tablet   5   . lovastatin (MEVACOR) 20 MG tablet       take 1 tablet by mouth once daily   30 tablet   11   . tamsulosin (FLOMAX) 0.4 MG CAPS capsule   Oral   Take 0.4 mg by mouth at bedtime.          . vitamin C (ASCORBIC ACID) 500 MG tablet   Oral   Take 500 mg by mouth daily.          BP 101/44  Pulse 94  Temp(Src) 97.9 F (36.6 C) (Oral)  Resp 18  Wt 334 lb 1.6 oz (151.547 kg)  BMI 46.62 kg/m2  SpO2 97% Physical Exam  Nursing note and vitals reviewed. Constitutional: He appears well-developed and well-nourished. No distress.  HENT:  Head: Normocephalic and atraumatic.  Eyes: Conjunctivae are normal. Right eye exhibits no discharge. Left eye exhibits no discharge.  Neck: Neck supple.  Cardiovascular: Normal rate, regular rhythm and normal heart sounds.  Exam reveals no gallop and no friction rub.   No murmur heard. Pulmonary/Chest: Effort normal and breath sounds normal. No respiratory distress.  Abdominal: Soft. He exhibits no distension. There is no tenderness.  Genitourinary:  Swollen  scrotum. The right hemiscrotum is indurated. Tender to palpation with increased warmth. Perineal region w/o concerning skin changes.   Musculoskeletal: He exhibits no edema and no tenderness.  Neurological: He is alert.  Skin: Skin is warm and dry.  Psychiatric: He has a normal mood and affect. His behavior is normal. Thought content normal.    ED Course  Procedures (including critical care time) Labs Review Labs Reviewed  URINALYSIS, ROUTINE W REFLEX MICROSCOPIC  CBC WITH DIFFERENTIAL  BASIC METABOLIC PANEL   Imaging Review US Scrotum  12/24/2012   CLINICAL DATA:  Scrotal swelling.  EXAM: ULTRASOUND OF SCROTUM  TECHNIQUE: Complete ultrasound examination of the testicles, epididymis, and other scrotal structures was performed.  COMPARISON:  None.  FINDINGS: Right testicle  Measurements: 3.1 x 3.6 x 4.9 cm. No mass or microlithiasis visualized. There is increased vascularity to the right testicle compared to the left.  Left  testicle  Measurements: 2.2 x 2.9 x 4.2 cm. No mass or microlithiasis visualized. Normal vascular flow.  Right epididymis: 6 mm cyst versus spermatocele. Normal vascular flow.  Left epididymis:  Normal in size and appearance.  Hydrocele: Moderate size complex right hydrocele with multiple septations which may represent a pyocele. Small simple left hydrocele.  Varicocele:  None visualized.  IMPRESSION: Increased vascular flow to the right testicle compared to the left with moderate size complex right hydrocele/pyocele as findings may be due to acute right orchitis.  Small simple left hydrocele.  6 mm right epididymal cyst versus spermatocele.   Electronically Signed   By: Elberta Fortis M.D.   On: 12/24/2012 13:32   [place] Art/ven Flow Abd Pelv Doppler  12/24/2012   CLINICAL DATA:  Scrotal swelling.  EXAM: ULTRASOUND OF SCROTUM  TECHNIQUE: Complete ultrasound examination of the testicles, epididymis, and other scrotal structures was performed.  COMPARISON:  None.  FINDINGS: Right testicle  Measurements: 3.1 x 3.6 x 4.9 cm. No mass or microlithiasis visualized. There is increased vascularity to the right testicle compared to the left.  Left testicle  Measurements: 2.2 x 2.9 x 4.2 cm. No mass or microlithiasis visualized. Normal vascular flow.  Right epididymis: 6 mm cyst versus spermatocele. Normal vascular flow.  Left epididymis:  Normal in size and appearance.  Hydrocele: Moderate size complex right hydrocele with multiple septations which may represent a pyocele. Small simple left hydrocele.  Varicocele:  None visualized.  IMPRESSION: Increased vascular flow to the right testicle compared to the left with moderate size complex right hydrocele/pyocele as findings may be due to acute right orchitis.  Small simple left hydrocele.  6 mm right epididymal cyst versus spermatocele.   Electronically Signed   By: Elberta Fortis M.D.   On: 12/24/2012 13:32    MDM   1. Orchitis   2. UTI (urinary tract infection)      1:47 PM [place] as above. Dr Berneice Heinrich, urology currently in ED preforming procedure on another pt. Will discuss further once completed. Pt continues to look well. HR remaining around 100. Did have one recorded low BP but this improved w/o intervention. May be spurious? Consider sepsis, but doubt. . IV access. IVF. Basic blood work. Will check rectal temp.   The patient was evaluated by urology. They feel he is appropriate for outpatient Rx at this time. Arranged for followup with him in the office later this week. Term precautions were discussed.  Raeford Razor, MD 01/01/13 [phone]

## 2012-12-24 NOTE — Consult Note (Signed)
Reason for Consult:Rt Epidiymorchitis  Referring Physician: Juleen China MD  Gabriel Love is an 72 y.o. male.  HPI:   1 - Rt Epididymorchitis - Pt with 3 days of progressive rt orchalgia and swelling as well as malaise. Exam and [place] c/w epidiymorchitis. Complex fluid collection on [place] c/w likely reactive hydrocele v. Very early pyocele.   2 - Urinary Incontinence - Pt with long h/o mixed incontinence managed by Dr. Patsi Sears. Has had microwave procedure in past. Uses adult briefs at [facility]ien with satisfaction. Denies changes in voiding habits or retention.   Today Gabriel Love is seen for above. Denies diabetes or immune compromise. No high-grade fevers.  Past Medical History  Diagnosis Date  . Hyperlipidemia   . Hypertension   . Prostate hypertrophy     Benign  . Depression   . DJD (degenerative joint disease)     Past Surgical History  Procedure Laterality Date  . Circumcision, non-newborn    . Joint replacement  12/2006    total hip replacement left hip    Family History  Problem Relation Age of Onset  . Hypertension Mother   . Diabetes Mother   . Heart failure Mother     cad/mi with rupture ventricle  . Hyperlipidemia Father   . Cancer Neg Hx     colon or prostate    Social History:  reports that he quit smoking about 44 years ago. He does not have any smokeless tobacco history on file. He reports that he does not drink alcohol or use illicit drugs.  Allergies:  Allergies  Allergen Reactions  . Ivp Dye [Iodinated Diagnostic Agents]   . Penicillins     REACTION: urticaria    Medications: I have reviewed the patient's current medications.  No results found for this or any previous visit (from the past 48 hour(s)).  US Scrotum  12/24/2012   CLINICAL DATA:  Scrotal swelling.  EXAM: ULTRASOUND OF SCROTUM  TECHNIQUE: Complete ultrasound examination of the testicles, epididymis, and other scrotal structures was performed.  COMPARISON:  None.  FINDINGS: Right testicle   Measurements: 3.1 x 3.6 x 4.9 cm. No mass or microlithiasis visualized. There is increased vascularity to the right testicle compared to the left.  Left testicle  Measurements: 2.2 x 2.9 x 4.2 cm. No mass or microlithiasis visualized. Normal vascular flow.  Right epididymis: 6 mm cyst versus spermatocele. Normal vascular flow.  Left epididymis:  Normal in size and appearance.  Hydrocele: Moderate size complex right hydrocele with multiple septations which may represent a pyocele. Small simple left hydrocele.  Varicocele:  None visualized.  IMPRESSION: Increased vascular flow to the right testicle compared to the left with moderate size complex right hydrocele/pyocele as findings may be due to acute right orchitis.  Small simple left hydrocele.  6 mm right epididymal cyst versus spermatocele.   Electronically Signed   By: Elberta Fortis M.D.   On: 12/24/2012 13:32   [place] Art/ven Flow Abd Pelv Doppler  12/24/2012   CLINICAL DATA:  Scrotal swelling.  EXAM: ULTRASOUND OF SCROTUM  TECHNIQUE: Complete ultrasound examination of the testicles, epididymis, and other scrotal structures was performed.  COMPARISON:  None.  FINDINGS: Right testicle  Measurements: 3.1 x 3.6 x 4.9 cm. No mass or microlithiasis visualized. There is increased vascularity to the right testicle compared to the left.  Left testicle  Measurements: 2.2 x 2.9 x 4.2 cm. No mass or microlithiasis visualized. Normal vascular flow.  Right epididymis: 6 mm cyst versus spermatocele. Normal vascular  flow.  Left epididymis:  Normal in size and appearance.  Hydrocele: Moderate size complex right hydrocele with multiple septations which may represent a pyocele. Small simple left hydrocele.  Varicocele:  None visualized.  IMPRESSION: Increased vascular flow to the right testicle compared to the left with moderate size complex right hydrocele/pyocele as findings may be due to acute right orchitis.  Small simple left hydrocele.  6 mm right epididymal cyst versus  spermatocele.   Electronically Signed   By: Elberta Fortis M.D.   On: 12/24/2012 13:32    Review of Systems  Constitutional: Positive for malaise/fatigue. Negative for fever, chills and weight loss.  HENT: Negative.   Eyes: Negative.   Respiratory: Negative.   Cardiovascular: Negative.   Gastrointestinal: Negative.   Genitourinary: Positive for urgency and frequency.       [place] incontinence, New right scrotal swelling and pain.  Musculoskeletal: Negative.   Skin: Negative.   Neurological: Negative.   Endo/Heme/Allergies: Negative.   Psychiatric/Behavioral: Negative.    Blood pressure 101/44, pulse 94, temperature 99.6 F (37.6 C), temperature source Rectal, resp. rate 18, weight 151.547 kg (334 lb 1.6 oz), SpO2 97.00%. Physical Exam  Constitutional: He is oriented to person, place, and time. He appears well-developed and well-nourished.  Morbid obesity  HENT:  Head: Normocephalic and atraumatic.  Eyes: EOM are normal. Pupils are equal, round, and reactive to light.  Neck: Normal range of motion. Neck supple.  Cardiovascular: Normal rate.   Respiratory: Effort normal.  GI: Soft. Bowel sounds are normal.  Genitourinary:  Buried Penis. Right testicle warm, enlarged and tender w/o fluctuance or drainage or palpable fluid collection.  Musculoskeletal: Normal range of motion.  Neurological: He is alert and oriented to person, place, and time.  Skin: Skin is warm and dry.  Psychiatric: He has a normal mood and affect. His behavior is normal. Judgment and thought content normal.    Assessment/Plan: 1 - Rt Epididymorchitis - Exam w/o drainable fluid collections. [place] impressive but does not warrant surgical intervention at this point. Has excellent testicular blood flow on right.  Would prefer trial of medical therapy. He will get IV ABX x 1 in ER and then DC with PO ABX x 2-3 week course. Pt to call office on Monday to arrange visit for interval check in about 1 week. I will also try  to arrange for this well. He has UCX pending form ER now. Warned to contact MD for increasing fever or if drainage from area develops.   2 - Urinary Incontinence - At baseline and denies retention. No change in current management at this point.   Fronie Holstein 12/24/2012, 2:19 PM

## 2012-12-24 NOTE — ED Notes (Signed)
Pt comfortable with d/c and f/u instructions. Prescriptions x1 

## 2012-12-24 NOTE — ED Notes (Signed)
Pt is here with complaints of testicular swelling, unable to control urination, only complains of testicle soreness, not pain

## 2012-12-24 NOTE — ED Notes (Signed)
Dr. Kohut at bedside 

## 2012-12-26 LAB — URINE CULTURE: Colony Count: >=100000

## 2012-12-27 ENCOUNTER — Telehealth (HOSPITAL_COMMUNITY): Payer: Self-pay | Admitting: Emergency Medicine

## 2012-12-27 ENCOUNTER — Ambulatory Visit: Payer: 59 | Admitting: Internal Medicine

## 2012-12-27 NOTE — ED Notes (Signed)
Post ED Visit - Positive Culture Follow-up  Culture report reviewed by antimicrobial stewardship pharmacist: []  Wes Dulaney, Pharm.D., BCPS [x]  Celedonio Miyamoto, Pharm.D., BCPS []  Georgina Pillion, Pharm.D., BCPS []  [place], [address].D., BCPS, AAHIVP []  Estella Husk, Pharm.D., BCPS, AAHIVP  Positive urine culture Treated with Cipro, organism sensitive to the same and no further patient follow-up is required at this time.  Kylie A Holland 12/27/2012, 11:43 AM

## 2013-02-16 ENCOUNTER — Encounter: Payer: Self-pay | Admitting: Internal Medicine

## 2013-02-28 ENCOUNTER — Telehealth: Payer: Self-pay

## 2013-02-28 NOTE — Telephone Encounter (Signed)
Phone call from [place] with [employer] [phone] requesting patient's insurance info be faxed since he did not want to give that info over the phone.

## 2013-03-12 ENCOUNTER — Other Ambulatory Visit: Payer: Self-pay | Admitting: Internal Medicine

## 2013-04-23 ENCOUNTER — Other Ambulatory Visit: Payer: Self-pay | Admitting: Internal Medicine

## 2013-04-23 DIAGNOSIS — R195 Other fecal abnormalities: Secondary | ICD-10-CM

## 2013-04-27 ENCOUNTER — Telehealth: Payer: Self-pay | Admitting: *Deleted

## 2013-04-27 NOTE — Telephone Encounter (Signed)
Patient phoned requesting results of recent hemmocult cards sent in.  Please advise.  None seen in lab review.    CB# [phone]

## 2013-04-27 NOTE — Telephone Encounter (Signed)
1. Was name or mrn # on cards. 2. What day were they brought in or mailed..  No record available. Call lab to see if they have them in process.

## 2013-04-30 ENCOUNTER — Encounter: Payer: Self-pay | Admitting: Gastroenterology

## 2013-05-04 ENCOUNTER — Encounter: Payer: Self-pay | Admitting: Internal Medicine

## 2013-05-29 ENCOUNTER — Encounter: Payer: Self-pay | Admitting: Gastroenterology

## 2013-05-29 ENCOUNTER — Ambulatory Visit (INDEPENDENT_AMBULATORY_CARE_PROVIDER_SITE_OTHER): Payer: Medicare Other | Admitting: Gastroenterology

## 2013-05-29 VITALS — BP 114/70 | HR 68 | Ht 71.0 in | Wt 325.0 lb

## 2013-05-29 DIAGNOSIS — R195 Other fecal abnormalities: Secondary | ICD-10-CM

## 2013-05-29 MED ORDER — PEG-KCL-NACL-NASULF-NA ASC-C 100 G PO SOLR: 1.0000 | Freq: Once | ORAL | Status: DC

## 2013-05-29 NOTE — Progress Notes (Signed)
_                                                                                                                History of Present Illness: Gabriel Love 73 year old white male referred for colonoscopy.:  Cologuard test was positive.  The patient has no GI complaints including change of bowel habits, abdominal pain, melena or hematochezia.    Past Medical History  Diagnosis Date  . Hyperlipidemia   . Hypertension   . Prostate hypertrophy     Benign  . Depression   . DJD (degenerative joint disease)    Past Surgical History  Procedure Laterality Date  . Circumcision, non-newborn    . Joint replacement  12/2006    total hip replacement left hip   family history includes Diabetes in his mother; Heart failure in his mother; Hyperlipidemia in his father; Hypertension in his mother. There is no history of Cancer. Current Outpatient Prescriptions  Medication Sig Dispense Refill  . aspirin 325 MG EC tablet Take 325 mg by mouth daily.        . finasteride (PROSCAR) 5 MG tablet Take 5 mg by mouth at bedtime.       . hydrochlorothiazide (HYDRODIURIL) 25 MG tablet take 1 tablet once daily  90 tablet  3  . LEXAPRO 10 MG tablet TAKE ONE TABLET BY MOUTH ONCE DAILY  30 tablet  5  . lovastatin (MEVACOR) 20 MG tablet take 1 tablet by mouth once daily  30 tablet  11  . tamsulosin (FLOMAX) 0.4 MG CAPS capsule Take 0.4 mg by mouth at bedtime.       . vitamin C (ASCORBIC ACID) 500 MG tablet Take 500 mg by mouth daily.       No current facility-administered medications for this visit.   Allergies as of 05/29/2013 - Review Complete 05/29/2013  Allergen Reaction Noted  . Ivp dye [iodinated diagnostic agents]  09/17/2010  . Penicillins  11/23/2006    reports that he quit smoking about 45 years ago. He has quit using smokeless tobacco. He reports that he does not drink alcohol or use illicit drugs.     Review of Systems: He has joint pains in his shoulders and hips and knees  Pertinent positive and negative review of systems were noted in the above HPI section. All other review of systems were otherwise negative.  Vital signs were reviewed in today's medical record Physical Exam: General: Obese male in no acute distress Skin: anicteric Head: Normocephalic and atraumatic Eyes:  sclerae anicteric, EOMI Ears: Normal auditory acuity Mouth: No deformity or lesions Neck: Supple, no masses or thyromegaly Lungs: Clear throughout to auscultation Heart: Regular rate and rhythm; no murmurs, rubs or bruits Abdomen: Soft, non tender and non distended. No masses, hepatosplenomegaly or hernias noted. Normal Bowel sounds Rectal:deferred Musculoskeletal: Symmetrical with no gross deformities  Skin: No lesions on visible extremities Pulses:  Normal pulses noted Extremities: No clubbing, cyanosis, edema or deformities noted Neurological: Alert oriented x 4, grossly nonfocal Cervical Nodes:  No significant cervical adenopathy Inguinal Nodes: No significant inguinal adenopathy Psychological:  Alert and cooperative. Normal mood and affect  See Assessment and Plan under Problem List

## 2013-05-29 NOTE — Assessment & Plan Note (Signed)
Positive cologuard tests suggest that he has either and advanced adenoma or colonic neoplasm.    Recommendations #1 colonoscopy  Risks, alternatives, and complications of the procedure, including bleeding, perforation, and possible need for surgery, were explained to the patient.  Patient's questions were answered.

## 2013-05-29 NOTE — Patient Instructions (Signed)

## 2013-05-30 ENCOUNTER — Encounter: Payer: Self-pay | Admitting: Gastroenterology

## 2013-06-06 ENCOUNTER — Other Ambulatory Visit (INDEPENDENT_AMBULATORY_CARE_PROVIDER_SITE_OTHER): Payer: Medicare Other

## 2013-06-06 ENCOUNTER — Encounter: Payer: Self-pay | Admitting: Internal Medicine

## 2013-06-06 ENCOUNTER — Ambulatory Visit (INDEPENDENT_AMBULATORY_CARE_PROVIDER_SITE_OTHER)
Admission: RE | Admit: 2013-06-06 | Discharge: 2013-06-06 | Disposition: A | Discharge status: Home / Self Care | Payer: Medicare Other | Source: Ambulatory Visit | Attending: Internal Medicine | Admitting: Internal Medicine

## 2013-06-06 ENCOUNTER — Ambulatory Visit (INDEPENDENT_AMBULATORY_CARE_PROVIDER_SITE_OTHER): Payer: Medicare Other | Admitting: Internal Medicine

## 2013-06-06 VITALS — BP 148/88 | HR 77 | Temp 97.8°F | Wt 326.4 lb

## 2013-06-06 DIAGNOSIS — E785 Hyperlipidemia, unspecified: Secondary | ICD-10-CM

## 2013-06-06 DIAGNOSIS — M353 Polymyalgia rheumatica: Secondary | ICD-10-CM

## 2013-06-06 DIAGNOSIS — I10 Essential (primary) hypertension: Secondary | ICD-10-CM

## 2013-06-06 DIAGNOSIS — M25519 Pain in unspecified shoulder: Secondary | ICD-10-CM

## 2013-06-06 DIAGNOSIS — R195 Other fecal abnormalities: Secondary | ICD-10-CM

## 2013-06-06 DIAGNOSIS — N400 Enlarged prostate without lower urinary tract symptoms: Secondary | ICD-10-CM

## 2013-06-06 DIAGNOSIS — M19019 Primary osteoarthritis, unspecified shoulder: Secondary | ICD-10-CM

## 2013-06-06 LAB — COMPREHENSIVE METABOLIC PANEL
ALT: 19 U/L (ref 0–53)
AST: 25 U/L (ref 0–37)
Albumin: 4 g/dL (ref 3.5–5.2)
Alkaline Phosphatase: 64 U/L (ref 39–117)
BUN: 22 mg/dL (ref 6–23)
CALCIUM: 8.9 mg/dL (ref 8.4–10.5)
CO2: 30 meq/L (ref 19–32)
Chloride: 101 mEq/L (ref 96–112)
Creatinine, Ser: 0.9 mg/dL (ref 0.4–1.5)
GFR: 88.84 mL/min (ref >60.00)
Glucose, Bld: 99 mg/dL (ref 70–99)
Potassium: 4.4 mEq/L (ref 3.5–5.1)
Sodium: 138 mEq/L (ref 135–145)
Total Bilirubin: 0.6 mg/dL (ref 0.3–1.2)
Total Protein: 7.3 g/dL (ref 6.0–8.3)

## 2013-06-06 LAB — LIPID PANEL
Cholesterol: 181 mg/dL (ref 0–200)
HDL: 35.6 mg/dL — ABNORMAL LOW (ref >39.00)
LDL Cholesterol: 113 mg/dL — ABNORMAL HIGH (ref 0–99)
TRIGLYCERIDES: 163 mg/dL — AB (ref 0.0–149.0)
Total CHOL/HDL Ratio: 5
VLDL: 32.6 mg/dL (ref 0.0–40.0)

## 2013-06-06 LAB — SEDIMENTATION RATE: Sed Rate: 14 mm/hr (ref 0–22)

## 2013-06-06 NOTE — Progress Notes (Signed)
Pre visit review using our clinic review tool, if applicable. No additional management support is needed unless otherwise documented below in the visit note. 

## 2013-06-06 NOTE — Assessment & Plan Note (Signed)
having recurrent nocturia and urgency despite Proscare and Flomax  Plan Return to [facility]

## 2013-06-06 NOTE — Patient Instructions (Signed)
Thanks for coming in.  Getting the colonoscopy is the right thing to do. Remember that 20% of positive tests are false positives.  Leg weakness and shoulder weakness - need to rule out this muscle inflammation called Polymyalgia Rheumatica - done with a simple blood test.  You do have limitation in shoulder movement. If it is arthritis it can be helped. Plan - shoulder x-ray and neck x-rays.  Will check routine labs: cholesterol, kidney function, sugar and results by mail.  Prostate - good to see Dr. Gaynelle Arabian.   Continuing care - will go ahead and transfer your care to Dr. Sharlene Motts.

## 2013-06-06 NOTE — Progress Notes (Signed)
Subjective:    Patient ID: Gabriel Love, male    DOB: 25-Nov-1940, 73 y.o.   MRN: [id number]  HPI Mr. Beckley has had a problem with his leg giving out ever since he had ESI lumbar spine. He is not clear as to whether it is the knee or the back. He does have trouble with stairs due to weakness but does not report much pain. He c/o bilateral shoulder pain and decreased ROM. It is difficult to work overhead, e.g. Brushing his hair.  He had a positive colo-guard test and he is set for colonoscopy March 31st  He is having recurrent trouble with nocturia x 3-4 and also daytime urgency. He does follow with urology  Past Medical History  Diagnosis Date  . Hyperlipidemia   . Hypertension   . Prostate hypertrophy     Benign  . Depression   . DJD (degenerative joint disease)    Past Surgical History  Procedure Laterality Date  . Circumcision, non-newborn    . Joint replacement  12/2006    total hip replacement left hip   Family History  Problem Relation Age of Onset  . Hypertension Mother   . Diabetes Mother   . Heart failure Mother     cad/mi with rupture ventricle  . Hyperlipidemia Father   . Cancer Neg Hx     colon or prostate   History   Social History  . Marital Status: Married    Spouse Name: N/A    Number of Children: 1  . Years of Education: N/A   Occupational History  . Retired    Social History Main Topics  . Smoking status: Former Smoker    Quit date: 03/22/1968  . Smokeless tobacco: Former Systems developer  . Alcohol Use: No  . Drug Use: No  . Sexual Activity: Not on file   Other Topics Concern  . Not on file   Social History Narrative   Married 1965   Very supportive wife      One daughter  60  One grandchild (girl  2002)   Retired from Geophysical data processor work          Current Outpatient Prescriptions on File Prior to Visit  Medication Sig Dispense Refill  . aspirin 325 MG EC tablet Take 325 mg by mouth daily.        . finasteride (PROSCAR) 5 MG tablet  Take 5 mg by mouth at bedtime.       . hydrochlorothiazide (HYDRODIURIL) 25 MG tablet take 1 tablet once daily  90 tablet  3  . LEXAPRO 10 MG tablet TAKE ONE TABLET BY MOUTH ONCE DAILY  30 tablet  5  . lovastatin (MEVACOR) 20 MG tablet take 1 tablet by mouth once daily  30 tablet  11  . peg 3350 powder (MOVIPREP) 100 G SOLR Take 1 kit (200 g total) by mouth once.  1 kit  0  . tamsulosin (FLOMAX) 0.4 MG CAPS capsule Take 0.4 mg by mouth at bedtime.       . vitamin C (ASCORBIC ACID) 500 MG tablet Take 500 mg by mouth daily.       No current facility-administered medications on file prior to visit.      Review of Systems System review is negative for any constitutional, cardiac, pulmonary, GI or neuro symptoms or complaints other than as described in the HPI.     Objective:   Physical Exam Filed Vitals:   06/06/13 0810  BP: 148/88  Pulse: 77  Temp: 97.8 F (36.6 C)   Wt Readings from Last 3 Encounters:  06/06/13 326 lb 6.4 oz (148.054 kg)  05/29/13 325 lb (147.419 kg)  12/24/12 334 lb 1.6 oz (151.547 kg)   BP Readings from Last 3 Encounters:  06/06/13 148/88  05/29/13 114/70  12/24/12 135/83   Gen'l - obese man in no acute distress HEENT - C&S clear Neck - good ROM Cor - RRR, 2+ radial pulse Pul normal respirations MSK - decreased ROM shoulders adduction, flexion, extension. No crepitus at the shoulder. Neuro - A&O, normal grip strength. Able to ambulate unassisted except for single point cane.       Assessment & Plan:  1. Leg weakness and shoulder weakness - need to rule out this muscle inflammation called Polymyalgia Rheumatica - done with a simple blood test.  You do have limitation in shoulder movement. If it is arthritis it can be helped. Plan - shoulder x-ray and neck x-rays.  Addendum: Sed rate 14 C-spine series: FINDINGS:  Bilateral carotid atherosclerotic calcifications noted.  There is loss of disc height at C5-6 and C6-7 with associated  prominent  anterior interbody spurring and posterior osseous ridging.  As uncinate spurring at C5-6 and C6-7 on the left, and C6-7 on the  right, without overt osseous foraminal stenosis.  No acute subluxation. No conventional radiographic findings of  fracture.  IMPRESSION:  1. Cervical spondylosis and degenerative disc disease most notably  at C5-6 and C6-7.  2. Carotid atherosclerotic calcification.  Left shoulder: FINDINGS:  There is no evidence of acute fracture or dislocation. Severe joint  space narrowing is appreciated within the glenohumeral joint. There  is periarticular sclerosis and hypertrophic spurring of the humeral  head. Periarticular sclerosis and peripheral hypertrophic spurring  is appreciated within the acromioclavicular joint. The left lung  apex is unremarkable. Hypertrophic spurring is also identified along  the undersurface of the distal clavicle.  IMPRESSION:  Osteoarthritic changes without evidence of acute osseous  Abnormalities.  Right shoulder: FINDINGS:  Three views of the right shoulder submitted. Degenerative changes  are noted glenohumeral joint. There is narrowing of joint space.  Spurring of humeral head. Moderate degenerative changes AC joint.  Mild glenoid sclerosis. No acute fracture or subluxation.  IMPRESSION:  No acute fracture or subluxation. Degenerative changes as described  above.

## 2013-06-07 ENCOUNTER — Telehealth: Payer: Self-pay | Admitting: Family Medicine

## 2013-06-07 DIAGNOSIS — M19019 Primary osteoarthritis, unspecified shoulder: Secondary | ICD-10-CM | POA: Insufficient documentation

## 2013-06-07 NOTE — Assessment & Plan Note (Signed)
Limiting pain both shoulders. Sed rate normal r/o PMR. X-ray supports DJD worse on the left.  Plan Orthopedic consult.

## 2013-06-07 NOTE — Telephone Encounter (Signed)
Relevant patient education mailed to patient.  

## 2013-06-07 NOTE — Assessment & Plan Note (Signed)
Patient with positive colo-guard testing indication possible adenomatous polyp vs cancer.  Plan For diagnostic colonoscopy March 31st.

## 2013-06-08 ENCOUNTER — Encounter: Payer: Self-pay | Admitting: Internal Medicine

## 2013-06-19 ENCOUNTER — Other Ambulatory Visit: Payer: Self-pay

## 2013-06-19 ENCOUNTER — Encounter: Payer: Self-pay | Admitting: Gastroenterology

## 2013-06-19 ENCOUNTER — Ambulatory Visit (AMBULATORY_SURGERY_CENTER): Payer: Medicare Other | Admitting: Gastroenterology

## 2013-06-19 ENCOUNTER — Other Ambulatory Visit: Payer: Medicare Other

## 2013-06-19 ENCOUNTER — Telehealth: Payer: Self-pay

## 2013-06-19 ENCOUNTER — Other Ambulatory Visit: Payer: Self-pay | Admitting: *Deleted

## 2013-06-19 VITALS — BP 172/82 | HR 65 | Temp 99.1°F | Resp 19 | Ht 71.0 in | Wt 325.0 lb

## 2013-06-19 DIAGNOSIS — C189 Malignant neoplasm of colon, unspecified: Secondary | ICD-10-CM

## 2013-06-19 DIAGNOSIS — Z1211 Encounter for screening for malignant neoplasm of colon: Secondary | ICD-10-CM

## 2013-06-19 DIAGNOSIS — K5730 Diverticulosis of large intestine without perforation or abscess without bleeding: Secondary | ICD-10-CM

## 2013-06-19 DIAGNOSIS — D126 Benign neoplasm of colon, unspecified: Secondary | ICD-10-CM

## 2013-06-19 DIAGNOSIS — C180 Malignant neoplasm of cecum: Secondary | ICD-10-CM

## 2013-06-19 DIAGNOSIS — R195 Other fecal abnormalities: Secondary | ICD-10-CM

## 2013-06-19 DIAGNOSIS — K6389 Other specified diseases of intestine: Secondary | ICD-10-CM

## 2013-06-19 DIAGNOSIS — K648 Other hemorrhoids: Secondary | ICD-10-CM

## 2013-06-19 MED ORDER — SODIUM CHLORIDE 0.9 % IV SOLN: 500.0000 mL | INTRAVENOUS | Status: DC

## 2013-06-19 NOTE — Op Note (Addendum)
[facility]  [address] [place], [address]   COLONOSCOPY PROCEDURE REPORT  PATIENT: Gabriel Love, Gabriel Love  MR#: [id number] BIRTHDATE: March 15, 1941 , 72  yrs. old GENDER: Male ENDOSCOPIST: Inda Castle, MD REFERRED [id number] Sarajane Jews, M.D. PROCEDURE DATE:  06/19/2013 PROCEDURE:   Colonoscopy with biopsy and Colonoscopy with snare polypectomy First Screening Colonoscopy - Avg.  risk and is 50 yrs.  old or older Yes.  Prior Negative Screening - Now for repeat screening. N/A  History of Adenoma - Now for follow-up colonoscopy & has been > or = to 3 yrs.  N/A  Polyps Removed Today? Yes. ASA CLASS:   Class II INDICATIONS:o Cologuard positive. MEDICATIONS: Propofol (Diprivan) 460 mg IV  DESCRIPTION OF PROCEDURE:   After the risks benefits and alternatives of the procedure were thoroughly explained, informed consent was obtained.  A digital rectal exam revealed no abnormalities of the rectum.   The LB [id number]  endoscope was introduced through the anus and advanced to the terminal ileum which was intubated for a short distance. No adverse events experienced.   The quality of the prep was Suprep good  The instrument was then slowly withdrawn as the colon was fully examined.      COLON FINDINGS: At the ileocecal valve there is an exophytic, slightly friable mass.  Multiple biopsies were taken. A 2 mm and 5 mm sessile polyps were seen the proximal transverse colon and removed with cold biopsy forceps and cold polypectomy snare, respectively, and submitted to pathology.   A 7 mm sessile polyp was identified in the mid transverse colon it was removed with cold polypectomy snare and submitted to pathology. In the proximal descending colon there was a 7 mm sessile polyp which was removed with cold polypectomy snare and submitted to pathology.   A 7 mm sessile polyp was identified in the mid transverse colon it was removed with cold polypectomy snare and submitted to  pathology. In the proximal descending colon there was a 7 mm sessile polyp which was removed with cold polypectomy snare and submitted to pathology. A 2 mm sessile rectal polyp was seen iin the rectum and removed with cold forceps.   Moderate diverticulosis was noted in the sigmoid colon.   Internal hemorrhoids were found.  Retroflexed views revealed no abnormalities. The time to cecum=4 minutes 08 seconds. Withdrawal time=23 minutes 46 seconds.  The scope was withdrawn and the procedure completed. COMPLICATIONS: There were no complications.  ENDOSCOPIC IMPRESSION: 1.  malignant neoplasm, ileocecal valve 2.  colonic polyposis 3.  diverticulosis 4.  internal hemorrhoids  RECOMMENDATIONS: 1.  Surgical resection 2.  MRI of the chest abdomen and pelvis (iodine allergy) 3.  colonoscopy one year   eSigned:  Inda Castle, MD 06/19/2013 5:02 PM Revised: 06/19/2013 5:02 PM  cc:   PATIENT NAME:  Gabriel Love, Gabriel Love MR#: [id number]

## 2013-06-19 NOTE — Progress Notes (Signed)
Pt had blood work 06-06-13 bun 22, creatine 0.9.  Dr. Deatra Ina ordered a CEA level to be drawn.  CMA on 4th floor will put order in.  Pt will go to the lab for blood work on discharge.  I gave pt 2 bottles of contrast and instruction sheet to be filled out by pt when appoint is given by Dr. Kelby Fam nurse.  maw

## 2013-06-19 NOTE — Progress Notes (Signed)
Pt is allergic to iodine and x-ray dye.  Per Dr. Deatra Ina change ct to MRI.  Salome Arnt, RN called Drema Balzarine, CMA and the pt requested to be an open MRI. Nurse on 3rd floor  Pt to lab on discharge for CEA to be drawn.

## 2013-06-19 NOTE — Progress Notes (Signed)
Pt is allergic to iodine and IV contrast.  Per Dr. Deatra Ina change CT to MRI.  Appoint April 8 at 6:30 pm.  Pt to arrive at 6:00pm.  NPO after 2:30pm.  Early Chars went over MRI instructions with pt and his wife.  Pt to the lab for CEA level to be drawn.  MRI is open.  Pt is claustrphobic.  maw

## 2013-06-19 NOTE — Telephone Encounter (Signed)
Pt has contrast allergy. Pt scheduled for MRI of CAP at [facility] 06/27/13. Pt to arrive there at 6pm for a 6:30pm appt. Pt to be NPO after 2:30pm. Evlyn Courier RN to notify pt of appt date and time.

## 2013-06-19 NOTE — Progress Notes (Signed)
Called to room to assist during endoscopic procedure.  Patient ID and intended procedure confirmed with present staff. Received instructions for my participation in the procedure from the performing physician.  

## 2013-06-19 NOTE — Patient Instructions (Addendum)
YOU HAD AN ENDOSCOPIC PROCEDURE TODAY AT THE [facility]: Refer to the procedure report that was given to you for any specific questions about what was found during the examination.  If the procedure report does not answer your questions, please call your gastroenterologist to clarify.  If you requested that your care partner not be given the details of your procedure findings, then the procedure report has been included in a sealed envelope for you to review at your convenience later.  YOU SHOULD EXPECT: Some feelings of bloating in the abdomen. Passage of more gas than usual.  Walking can help get rid of the air that was put into your GI tract during the procedure and reduce the bloating. If you had a lower endoscopy (such as a colonoscopy or flexible sigmoidoscopy) you may notice spotting of blood in your stool or on the toilet paper. If you underwent a bowel prep for your procedure, then you may not have a normal bowel movement for a few days.  DIET: Your first meal following the procedure should be a light meal and then it is ok to progress to your normal diet.  A half-sandwich or bowl of soup is an example of a good first meal.  Heavy or fried foods are harder to digest and may make you feel nauseous or bloated.  Likewise meals heavy in dairy and vegetables can cause extra gas to form and this can also increase the bloating.  Drink plenty of fluids but you should avoid alcoholic beverages for 24 hours.  ACTIVITY: Your care partner should take you home directly after the procedure.  You should plan to take it easy, moving slowly for the rest of the day.  You can resume normal activity the day after the procedure however you should NOT DRIVE or use heavy machinery for 24 hours (because of the sedation medicines used during the test).    SYMPTOMS TO REPORT IMMEDIATELY: A gastroenterologist can be reached at any hour.  During normal business hours, 8:30 AM to 5:00 PM Monday through Friday,  call [phone].  After hours and on weekends, please call the GI answering service at [phone] who will take a message and have the physician on call contact you.   Following lower endoscopy (colonoscopy or flexible sigmoidoscopy):  Excessive amounts of blood in the stool  Significant tenderness or worsening of abdominal pains  Swelling of the abdomen that is new, acute  Fever of 100F or higher   FOLLOW UP: If any biopsies were taken you will be contacted by phone or by letter within the next 1-3 weeks.  Call your gastroenterologist if you have not heard about the biopsies in 3 weeks.  Our staff will call the home number listed on your records the next business day following your procedure to check on you and address any questions or concerns that you may have at that time regarding the information given to you following your procedure. This is a courtesy call and so if there is no answer at the home number and we have not heard from you through the emergency physician on call, we will assume that you have returned to your regular daily activities without incident.  SIGNATURES/CONFIDENTIALITY: You and/or your care partner have signed paperwork which will be entered into your electronic medical record.  These signatures attest to the fact that that the information above on your After Visit Summary has been reviewed and is understood.  Full responsibility of the confidentiality of   this discharge information lies with you and/or your care-partner.   Handouts were given to your care partner on polyps, diverticulosis, a high fiber diet with liberal fluid intake and hemorrhoids. Instructions for CT to be filled out when appointment given by 3 floor nurse. Patient to have CEA blood work drawn on discharge. You may resume your current medications today. Please call if any questions or concerns.

## 2013-06-19 NOTE — Progress Notes (Signed)
Report to pacu rn, vss, bbs=clear 

## 2013-06-20 ENCOUNTER — Telehealth: Payer: Self-pay | Admitting: *Deleted

## 2013-06-20 ENCOUNTER — Telehealth: Payer: Self-pay

## 2013-06-20 ENCOUNTER — Other Ambulatory Visit: Payer: Self-pay

## 2013-06-20 DIAGNOSIS — K6389 Other specified diseases of intestine: Secondary | ICD-10-CM

## 2013-06-20 LAB — CEA: CEA: 1.5 ng/mL (ref 0.0–5.0)

## 2013-06-20 NOTE — Telephone Encounter (Signed)
Received call from [facility]. Per their radiologist the proper test for the pt to have if he needs it would be either a CT without contrast or a PET CT. He states if you have questions regarding this to call him. He will be at the phone number [phone] today and this is Dr. Katherine Basset.   Please let advise regarding scan.

## 2013-06-20 NOTE — Telephone Encounter (Signed)
Pt scheduled for CT of cap without contrast for April 8th at 3:15pm. Pt to go by and pick up drink for CT prior to Tuesday. Pt aware of appt.

## 2013-06-20 NOTE — Telephone Encounter (Signed)
  Follow up Call-  Call back number 06/19/2013  Post procedure Call Back phone  # [phone]  Permission to leave phone message Yes    Pt was gone; spoke with wife Patient questions:  Do you have a fever, pain , or abdominal swelling? no Pain Score  0 *  Have you tolerated food without any problems? yes  Have you been able to return to your normal activities? yes  Do you have any questions about your discharge instructions: Diet   no Medications  no Follow up visit  no  Do you have questions or concerns about your Care? no  Actions: * If pain score is 4 or above: No action needed, pain <4.

## 2013-06-20 NOTE — Telephone Encounter (Signed)
Proceed with CT without contrast.  

## 2013-06-27 ENCOUNTER — Ambulatory Visit
Admission: RE | Admit: 2013-06-27 | Discharge: 2013-06-27 | Disposition: A | Discharge status: Home / Self Care | Payer: Medicare Other | Source: Ambulatory Visit | Attending: Gastroenterology | Admitting: Gastroenterology

## 2013-06-27 ENCOUNTER — Other Ambulatory Visit: Payer: Medicare Other

## 2013-06-27 DIAGNOSIS — K6389 Other specified diseases of intestine: Secondary | ICD-10-CM

## 2013-06-28 ENCOUNTER — Telehealth: Payer: Self-pay | Admitting: Gastroenterology

## 2013-06-28 NOTE — Telephone Encounter (Signed)
I have left a message that the results have not been reviewed the results of the CT and they will be notified as soon as they are available

## 2013-07-01 ENCOUNTER — Encounter (INDEPENDENT_AMBULATORY_CARE_PROVIDER_SITE_OTHER): Payer: Self-pay | Admitting: Surgery

## 2013-07-01 DIAGNOSIS — D120 Benign neoplasm of cecum: Secondary | ICD-10-CM | POA: Insufficient documentation

## 2013-07-04 ENCOUNTER — Encounter (INDEPENDENT_AMBULATORY_CARE_PROVIDER_SITE_OTHER): Payer: Self-pay | Admitting: General Surgery

## 2013-07-04 ENCOUNTER — Encounter (INDEPENDENT_AMBULATORY_CARE_PROVIDER_SITE_OTHER): Payer: Self-pay

## 2013-07-04 ENCOUNTER — Ambulatory Visit (INDEPENDENT_AMBULATORY_CARE_PROVIDER_SITE_OTHER): Payer: Medicare Other | Admitting: General Surgery

## 2013-07-04 VITALS — BP 138/86 | HR 80 | Temp 97.2°F | Resp 18 | Ht 71.0 in | Wt 330.4 lb

## 2013-07-04 DIAGNOSIS — Z01818 Encounter for other preprocedural examination: Secondary | ICD-10-CM

## 2013-07-04 DIAGNOSIS — K6389 Other specified diseases of intestine: Secondary | ICD-10-CM

## 2013-07-04 MED ORDER — METRONIDAZOLE 500 MG PO TABS: 500.0000 mg | ORAL_TABLET | ORAL | Status: AC

## 2013-07-04 NOTE — Progress Notes (Signed)
Chief Complaint  Patient presents with  . Mass    HISTORY: Gabriel Love is a 73 y.o. male who presents to the office with a colon mass.  This was found on colonoscopy.  Workup thus far has included CT scans, which are neg for metastatic disease limited due to body habitus.  CEA was normal.  Pt denies any weight loss.  Denies any changes in bowel habits or blood in stool.  He has no FH of colon cancer.  He had several other SSA's resected during colonoscopy.  Biopsy of mass shows adenoma but endoscopist is concerned for underlying cancer.  He denies any dyspnea on exertion or chest pain with activity. He has limited mobility due to arthritis in his hip.  Past Medical History  Diagnosis Date  . Hyperlipidemia   . Hypertension   . Prostate hypertrophy     Benign  . Depression   . DJD (degenerative joint disease)       Past Surgical History  Procedure Laterality Date  . Circumcision, non-newborn    . Joint replacement  12/2006    total hip replacement left hip      Current Outpatient Prescriptions  Medication Sig Dispense Refill  . aspirin 325 MG EC tablet Take 325 mg by mouth daily.        . clotrimazole-betamethasone (LOTRISONE) cream       . finasteride (PROSCAR) 5 MG tablet Take 5 mg by mouth at bedtime.       . hydrochlorothiazide (HYDRODIURIL) 25 MG tablet take 1 tablet once daily  90 tablet  3  . LEXAPRO 10 MG tablet TAKE ONE TABLET BY MOUTH ONCE DAILY  30 tablet  5  . lovastatin (MEVACOR) 20 MG tablet take 1 tablet by mouth once daily  30 tablet  11  . peg 3350 powder (MOVIPREP) 100 G SOLR Take 1 kit (200 g total) by mouth once.  1 kit  0  . tamsulosin (FLOMAX) 0.4 MG CAPS capsule Take 0.4 mg by mouth at bedtime.       . vitamin C (ASCORBIC ACID) 500 MG tablet Take 500 mg by mouth daily.      . metroNIDAZOLE (FLAGYL) 500 MG tablet Take 1 tablet (500 mg total) by mouth as directed.  3 tablet  0   No current facility-administered medications for this visit.      Allergies   Allergen Reactions  . Ivp Dye [Iodinated Diagnostic Agents] Anaphylaxis    Iv dye allergy in 1985, anaphylaxix//a.calhoun  . Penicillins     REACTION: urticaria      Family History  Problem Relation Age of Onset  . Hypertension Mother   . Diabetes Mother   . Heart failure Mother     cad/mi with rupture ventricle  . Hyperlipidemia Father   . Cancer Neg Hx     colon or prostate      History   Social History  . Marital Status: Married    Spouse Name: N/A    Number of Children: 1  . Years of Education: N/A   Occupational History  . Retired    Social History Main Topics  . Smoking status: Former Smoker    Quit date: 03/22/1968  . Smokeless tobacco: Former Systems developer  . Alcohol Use: No  . Drug Use: No  . Sexual Activity: None   Other Topics Concern  . None   Social History Narrative   Married 1965   Very supportive wife  One daughter  24  One grandchild (girl  2002)   Retired from Geophysical data processor work             REVIEW OF SYSTEMS - [place]: Review of Systems - General ROS: negative for - chills or fever Hematological and Lymphatic ROS: negative for - bleeding problems or blood clots Respiratory ROS: no cough, shortness of breath, or wheezing Cardiovascular ROS: no chest pain or dyspnea on exertion Gastrointestinal ROS: no abdominal pain, change in bowel habits, or black or bloody stools Genito-Urinary ROS: no dysuria, trouble voiding, or hematuria  EXAM: Filed Vitals:   07/04/13 1341  BP: 138/86  Pulse: 80  Temp: 97.2 F (36.2 C)  Resp: 18    Gen:  No acute distress.  Well nourished and well groomed.   Neurological: Alert and oriented to person, place, and time. Coordination normal.  Head: Normocephalic and atraumatic.  Eyes: Conjunctivae are normal. Pupils are equal, round, and reactive to light. No scleral icterus.  Neck: Normal range of motion. Neck supple. No tracheal deviation or thyromegaly present.  No cervical  lymphadenopathy. Cardiovascular: Normal rate, regular rhythm, normal heart sounds and intact distal pulses.   Respiratory: Effort normal.  No respiratory distress. No chest wall tenderness. Breath sounds normal.  No wheezes, rales or rhonchi.  GI: Soft. Bowel sounds are normal. The abdomen is soft and nontender.  There is no rebound and no guarding.  Musculoskeletal: Normal range of motion. Extremities are nontender.  Skin: Skin is warm and dry. No rash noted. No diaphoresis. No erythema. No pallor. No clubbing, cyanosis, or edema.   Psychiatric: Normal mood and affect. Behavior is normal. Judgment and thought content normal.     LABORATORY RESULTS: Lab Results  Component Value Date   CEA 1.5 06/19/2013   Lab Results  Component Value Date   CREATININE 0.9 06/06/2013     RADIOLOGY RESULTS:   Images and reports are reviewed. CT CHEST ABD AND PELVIS IMPRESSION:  Very limited examination due to body habitus. No obvious colon cancer is identified. I do not see any definite findings for metastatic disease but the exam is quite limited.     ASSESSMENT AND PLAN: Gabriel Love is a 73 y.o. 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  The CT scans are limited but showed no signs of metastatic disease. His CEA is normal. I recommended that he undergo a laparoscopic right colectomy. We may then assess the mass further for cancer and lymphatic spread.  We discussed the surgery in detail. All questions were answered. I would like him to be evaluated by his primary care physician prior to surgery for medical evaluation. The surgery and anatomy were described to the patient as well as the risks of surgery and the possible complications. These include: Bleeding, infection and possible wound complications such as hernia, damage to adjacent structures, leak of surgical connections, which can lead to other surgeries and possibly an ostomy (5-7%), possible need for other  procedures, such as abscess drains in radiology, possible prolonged hospital stay, possible diarrhea from removal of part of the colon, possible constipation from narcotics, prolonged fatigue/weakness or appetite loss, possible early recurrence of cancer, possible complications of their medical problems such as heart disease or arrhythmias or lung problems, death (less than 1%). I believe the patient understands and wishes to proceed with the surgery.     Rosario Adie, MD Colon and Rectal Surgery / General Surgery  [facility]      Visit Diagnoses: 1. Colonic mass   2. Preop examination     Primary Care Physician: Laurey Morale, MD

## 2013-07-04 NOTE — Patient Instructions (Addendum)
[facility]  ONE-DAY (1) PRE-OP HOME COLON PREP INSTRUCTIONS: ** MIRALAX / GATORADE PREP / FLAGYL**  You must follow the instructions below carefully.  If you have questions or problems, please call and speak to someone in the clinic department at our office:   [phone].     INSTRUCTIONS: 1. Five days prior to your procedure do not eat nuts, popcorn, or fruit with seeds.  Stop all fiber supplements such as Metamucil, Citrucel, etc. 2. Two days before surgery fill the prescription at a pharmacy of your choice and purchase the additional supplies below.         Emanuel a bottle of MIRALAX  (255 gm bottle)    Purchase one 32 oz GATORADE and place in refrigerator to get cold.  3.   Day Before Surgery:   6 am: Wash you abdomen with soap and repeat this on the morning of surgery    You may only have clear liquids (tea, coffee, juice, broth, jello, soft drinks, gummy bears).  You cannot have solid foods, cream, milk or milk products.  Drink at lease 8 ounces of liquids every hour while awake.   Take the Flagyl prescription as directed at 8 am, 2 pm and 8 pm.  It is helpful to take this with some jello instead of on an empty stomach.  Any flavor is ok, except red jello, which will cause red stools.   Mix half the bottle of MiraLax and the Gatorade in a large container.    10:00am: Begin drinking the Gatorade mixture until gone (8 oz every 15-30 minutes).      You may suck on a lime wedge or hard candy to "freshen your palate" in between glasses   If you are a diabetic, take your blood sugar reading several time throughout the prep.  Have some juice available to take if your sugar level gets too low   You may feel chilled while taking the prep.  Have some warm tea or broth to help warm up.   Continue clear liquids until midnight or bedtime  3. The day of your procedure:   Do not eat or drink ANYTHING after midnight before your surgery.     If you take Heart or  Blood Pressure medicine, ask the pre-op nurses about these during your preop appointment.   Further pre-operative instructions will be given to you from the hospital.   Expect to be contacted 5-7 days before your surgery.    Colorectal Cancer  Colorectal cancer is the second most common cancer in the [place], striking 140,000 people annually and causing 60,000 deaths. That's a staggering figure when you consider the disease is potentially curable if diagnosed in the early stages. Who is at risk? Though colorectal cancer may occur at any age, more than 90% of the patients are over age 39, at which point the risk doubles every ten years. In addition to age, other high risk factors include a family history of colorectal cancer and polyps and a personal history of ulcerative colitis, colon polyps or cancer of other organs, especially of the breast or uterus. How does it start? It is generally agreed that nearly all colon and rectal cancer begins in benign polyps. These pre-malignant growths occur on the bowel wall and may eventually increase in size and become cancer. Removal of benign polyps is one aspect of preventive medicine that really works! What are the symptoms? The most common symptoms are rectal  bleeding and changes in bowel habits, such as constipation or diarrhea. (These symptoms are also common in other diseases so it is important you receive a thorough examination should you experience them.) Abdominal pain and weight loss are usually late symptoms indicating possible extensive disease. Unfortunately, many polyps and early cancers fail to produce symptoms. Therefore, it is important that your routine physical includes colorectal cancer detection procedures once you reach age 8.  There are several methods for detection of colorectal cancer. These include digital rectal examination, a chemical test of the stool for blood, flexible sigmoidoscopy and colonoscopy (lighted tubular  instruments used to inspect the lower bowel) and barium enema. Be sure to discuss these options with your surgeon to determine which procedure is best for you. Individuals who have a first-degree relative (parent or sibling) with colon cancer or polyps should start their colon cancer screening at the age of 66. How is colorectal cancer treated? Colorectal cancer requires surgery in nearly all cases for complete cure. Radiation and chemotherapy are sometimes used in addition to surgery. Between 80-90% are restored to normal health if the cancer is detected and treated in the earliest stages. The cure rate drops to 50% or less when diagnosed in the later stages. Thanks to [employer], less than 5% of all colorectal cancer patients require a colostomy, the surgical construction of an artificial excretory opening from the colon. Can colon cancer be prevented? Colon cancer is very preventable. The most important step towards preventing colon cancer is getting a screening test. Any abnormal screening test should be followed by a colonoscopy. Some individuals prefer to start with colonoscopy as a screening test. Colonoscopy provides a detailed examination of the bowel. Polyps can be identified and can often be removed during colonoscopy. Though not definitely proven, there is some evidence that diet may play a significant role in preventing colorectal cancer. As far as we know, a high fiber, low fat diet is the only dietary measure that might help prevent colorectal cancer. Finally, pay attention to changes in your bowel habits. Any new changes such as persistent constipation, diarrhea, or blood in the stool should be discussed with your physician.   Can hemorrhoids lead to colon cancer? No, but hemorrhoids may produce symptoms similar to colon polyps or cancer. Should you experience these symptoms, you should have them examined and evaluated by a physician, preferably by a colon and rectal surgeon. What  is a colon and rectal surgeon? Colon and rectal surgeons are experts in the surgical and non-surgical treatment of diseases of the colon, rectum and anus. They have completed advanced surgical training in the treatment of these diseases as well as full general surgical training. Board-certified colon and rectal surgeons complete residencies in general surgery and colon and rectal surgery, and pass intensive examinations conducted by the American Board of Surgery and the American Board of Colon and Rectal Surgery. They are well-versed in the treatment of both benign and malignant diseases of the colon, rectum and anus and are able to perform routine screening examinations and surgically treat conditions if indicated to do so.  2012 American Society of Colon & Rectal Surgeons

## 2013-07-06 ENCOUNTER — Telehealth: Payer: Self-pay | Admitting: Family Medicine

## 2013-07-06 NOTE — Telephone Encounter (Signed)
We can work him in to establish next week

## 2013-07-06 NOTE — Telephone Encounter (Signed)
Pt's wife is calling regarding pt's appointment with dr. Sarajane Jews on 08/01/13. Pt is a transfer from [place], pt just found out he has colon cancer, and was told that before the surgery pt has to have an exam. Pt was told that dr Marcello Moores was going to contact dr. Sarajane Jews and get him an appointment asap. Pt need to know if there is anyway pt can come in before 08/01/13. There was no record of dr. Marcello Moores calling the office.

## 2013-07-06 NOTE — Telephone Encounter (Signed)
Pt has been sch for 07-13-13 at 10am

## 2013-07-13 ENCOUNTER — Encounter: Payer: Self-pay | Admitting: Family Medicine

## 2013-07-13 ENCOUNTER — Ambulatory Visit (INDEPENDENT_AMBULATORY_CARE_PROVIDER_SITE_OTHER): Payer: Medicare Other | Admitting: Family Medicine

## 2013-07-13 VITALS — BP 130/80 | HR 69 | Temp 98.2°F | Ht 71.0 in | Wt 329.0 lb

## 2013-07-13 DIAGNOSIS — I10 Essential (primary) hypertension: Secondary | ICD-10-CM

## 2013-07-13 DIAGNOSIS — E669 Obesity, unspecified: Secondary | ICD-10-CM

## 2013-07-13 DIAGNOSIS — D120 Benign neoplasm of cecum: Secondary | ICD-10-CM

## 2013-07-13 DIAGNOSIS — M48061 Spinal stenosis, lumbar region without neurogenic claudication: Secondary | ICD-10-CM

## 2013-07-13 DIAGNOSIS — D126 Benign neoplasm of colon, unspecified: Secondary | ICD-10-CM

## 2013-07-13 DIAGNOSIS — Z01818 Encounter for other preprocedural examination: Secondary | ICD-10-CM

## 2013-07-13 DIAGNOSIS — N400 Enlarged prostate without lower urinary tract symptoms: Secondary | ICD-10-CM

## 2013-07-13 DIAGNOSIS — E785 Hyperlipidemia, unspecified: Secondary | ICD-10-CM

## 2013-07-13 NOTE — Progress Notes (Signed)
   Subjective:    Patient ID: Gabriel Love, male    DOB: [birth year], 73 y.o.   MRN: [id number]  HPI 73 yr old male here for pre-operative evaluation and he is transferring to [place] from Dr. Crissie Sickles. He had a colonoscopy on 06-19-13 per Dr. Deatra Ina and a mass at the ileocecal valve was found. Biopsies showed a benign adenoma, but there is still some concern that it may contain cancer. A CT of the abdomen and pelvis was clear but this was very limited due to his body habitus and some motion artifacts. His labs were all normal, including a CEA. He is seeing Dr. Leighton Ruff who is planning a surgical resection sometime soon. He feels great with no GI symptoms at all.    Review of Systems  Constitutional: Negative.   Respiratory: Negative.   Cardiovascular: Negative.   Gastrointestinal: Negative.   Genitourinary: Negative.   Neurological: Negative.   Hematological: Negative.        Objective:   Physical Exam  Constitutional: He is oriented to person, place, and time. He appears well-developed and well-nourished.  Neck: No thyromegaly present.  Cardiovascular: Normal rate, regular rhythm, normal heart sounds and intact distal pulses.   EKG normal   Pulmonary/Chest: Effort normal and breath sounds normal. No respiratory distress. He has no wheezes. He has no rales.  Abdominal: Soft. Bowel sounds are normal. He exhibits no distension and no mass. There is no tenderness. There is no rebound and no guarding.  Musculoskeletal: He exhibits no edema.  Lymphadenopathy:    He has no cervical adenopathy.  Neurological: He is alert and oriented to person, place, and time.  Psychiatric: He has a normal mood and affect. His behavior is normal. Thought content normal.          Assessment & Plan:  He seems to be doing well in general. He is cleared for surgery from a medical standpoint. We will follow him up after the procedure.

## 2013-07-13 NOTE — Progress Notes (Signed)
Pre visit review using our clinic review tool, if applicable. No additional management support is needed unless otherwise documented below in the visit note. 

## 2013-07-16 ENCOUNTER — Other Ambulatory Visit (INDEPENDENT_AMBULATORY_CARE_PROVIDER_SITE_OTHER): Payer: Self-pay | Admitting: General Surgery

## 2013-07-16 DIAGNOSIS — Z01818 Encounter for other preprocedural examination: Secondary | ICD-10-CM

## 2013-07-16 MED ORDER — METRONIDAZOLE 500 MG PO TABS: 500.0000 mg | ORAL_TABLET | ORAL | Status: DC

## 2013-07-17 ENCOUNTER — Telehealth (INDEPENDENT_AMBULATORY_CARE_PROVIDER_SITE_OTHER): Payer: Self-pay | Admitting: General Surgery

## 2013-07-17 NOTE — Telephone Encounter (Signed)
Either date in May is fine.

## 2013-07-17 NOTE — Telephone Encounter (Signed)
Possible surgery dates May 18 9:30 start LDOW May 19 9:30 start LDOW June 12  Please advise if you'd like any of these dates or any other suggestions  Thanks

## 2013-07-18 ENCOUNTER — Telehealth (INDEPENDENT_AMBULATORY_CARE_PROVIDER_SITE_OTHER): Payer: Self-pay

## 2013-07-18 NOTE — Telephone Encounter (Signed)
Wife was given verbal instructions on flagyl , miraLax, and he is to drink clear fluids the afternoon before sx   , NPO after MN. Wife verbalized  understanding

## 2013-07-26 ENCOUNTER — Encounter (HOSPITAL_COMMUNITY): Payer: Self-pay | Admitting: Pharmacy Technician

## 2013-07-30 NOTE — Patient Instructions (Addendum)
[place]  07/30/2013   Your procedure is scheduled on: 08/07/13  Report to [place] at 7:30 AM.  Call this number if you have problems the morning of surgery 336-: [phone]   Remember: please follow bowel prep instructions   Do not eat food or drink liquids After Midnight.     Take these medicines the morning of surgery with A SIP OF WATER:  Lexapro, mevacor/lovastatin   Do not wear jewelry, make-up or nail polish.  Do not wear lotions, powders, or perfumes. You may wear deodorant.  Do not shave 48 hours prior to surgery. Men may shave face and neck.  Do not bring valuables to the hospital.  Contacts, dentures or bridgework may not be worn into surgery.  Leave suitcase in the car. After surgery it may be brought to your room.  For patients admitted to the hospital, checkout time is 11:00 AM the day of discharge.   Paulette Blanch, RN  pre op nurse call if needed [phone]    [facility] - Preparing for Surgery Before surgery, you can play an important role.  Because skin is not sterile, your skin needs to be as free of germs as possible.  You can reduce the number of germs on your skin by washing with CHG (chlorahexidine gluconate) soap before surgery.  CHG is an antiseptic cleaner which kills germs and bonds with the skin to continue killing germs even after washing. Please DO NOT use if you have an allergy to CHG or antibacterial soaps.  If your skin becomes reddened/irritated stop using the CHG and inform your nurse when you arrive at Short Stay. Do not shave (including legs and underarms) for at least 48 hours prior to the first CHG shower.  You may shave your face. Please follow these instructions carefully:  1.  Shower with CHG Soap the night before surgery and the  morning of Surgery.  2.  If you choose to wash your hair, wash your hair first as usual with your  normal  shampoo.  3.  After you shampoo, rinse your hair and body thoroughly to remove  the  shampoo.                            4.  Use CHG as you would any other liquid soap.  You can apply chg directly  to the skin and wash                       Gently with a scrungie or clean washcloth.  5.  Apply the CHG Soap to your body ONLY FROM THE NECK DOWN.   Do not use on open                           Wound or open sores. Avoid contact with eyes, ears mouth and genitals (private parts).                        Genitals (private parts) with your normal soap.             6.  Wash thoroughly, paying special attention to the area where your surgery  will be performed.  7.  Thoroughly rinse your body with warm water from the neck down.  8.  DO NOT shower/wash with your normal soap after using and rinsing  off  the CHG Soap.                9.  Pat yourself dry with a clean towel.            10.  Wear clean pajamas.            11.  Place clean sheets on your bed the night of your first shower and do not  sleep with pets. Day of Surgery : Do not apply any lotions/deodorants the morning of surgery.  Please wear clean clothes to the hospital/surgery center.  FAILURE TO FOLLOW THESE INSTRUCTIONS MAY RESULT IN THE CANCELLATION OF YOUR SURGERY PATIENT SIGNATURE_________________________________  NURSE SIGNATURE__________________________________  ________________________________________________________________________  WHAT IS A BLOOD TRANSFUSION? Blood Transfusion Information  A transfusion is the replacement of blood or some of its parts. Blood is made up of multiple cells which provide different functions.  Red blood cells carry oxygen and are used for blood loss replacement.  White blood cells fight against infection.  Platelets control bleeding.  Plasma helps clot blood.  Other blood products are available for specialized needs, such as hemophilia or other clotting disorders. BEFORE THE TRANSFUSION  Who gives blood for transfusions?   Healthy volunteers who are fully evaluated to  make sure their blood is safe. This is blood bank blood. Transfusion therapy is the safest it has ever been in the practice of medicine. Before blood is taken from a donor, a complete history is taken to make sure that person has no history of diseases nor engages in risky social behavior (examples are intravenous drug use or sexual activity with multiple partners). The donor's travel history is screened to minimize risk of transmitting infections, such as malaria. The donated blood is tested for signs of infectious diseases, such as HIV and hepatitis. The blood is then tested to be sure it is compatible with you in order to minimize the chance of a transfusion reaction. If you or a relative donates blood, this is often done in anticipation of surgery and is not appropriate for emergency situations. It takes many days to process the donated blood. RISKS AND COMPLICATIONS Although transfusion therapy is very safe and saves many lives, the main dangers of transfusion include:   Getting an infectious disease.  Developing a transfusion reaction. This is an allergic reaction to something in the blood you were given. Every precaution is taken to prevent this. The decision to have a blood transfusion has been considered carefully by your caregiver before blood is given. Blood is not given unless the benefits outweigh the risks. AFTER THE TRANSFUSION  Right after receiving a blood transfusion, you will usually feel much better and more energetic. This is especially true if your red blood cells have gotten low (anemic). The transfusion raises the level of the red blood cells which carry oxygen, and this usually causes an energy increase.  The nurse administering the transfusion will monitor you carefully for complications. HOME CARE INSTRUCTIONS  No special instructions are needed after a transfusion. You may find your energy is better. Speak with your caregiver about any limitations on activity for underlying  diseases you may have. SEEK MEDICAL CARE IF:   Your condition is not improving after your transfusion.  You develop redness or irritation at the intravenous (IV) site. SEEK IMMEDIATE MEDICAL CARE IF:  Any of the following symptoms occur over the next 12 hours:  Shaking chills.  You have a temperature by mouth above 102 F (38.9 C), not  controlled by medicine.  Chest, back, or muscle pain.  People around you feel you are not acting correctly or are confused.  Shortness of breath or difficulty breathing.  Dizziness and fainting.  You get a rash or develop hives.  You have a decrease in urine output.  Your urine turns a dark color or changes to pink, red, or brown. Any of the following symptoms occur over the next 10 days:  You have a temperature by mouth above 102 F (38.9 C), not controlled by medicine.  Shortness of breath.  Weakness after normal activity.  The white part of the eye turns yellow (jaundice).  You have a decrease in the amount of urine or are urinating less often.  Your urine turns a dark color or changes to pink, red, or brown. Document Released: 03/05/2000 Document Revised: 05/31/2011 Document Reviewed: 10/23/2007 [facility] Patient Information 2014 [place], [place].  _______________________________________________________________________

## 2013-07-30 NOTE — Progress Notes (Signed)
CT Chest 06/27/13 on EPIC, EKG 07/13/13 on EPIC

## 2013-07-31 ENCOUNTER — Encounter (INDEPENDENT_AMBULATORY_CARE_PROVIDER_SITE_OTHER): Payer: Self-pay

## 2013-07-31 ENCOUNTER — Encounter (HOSPITAL_COMMUNITY): Payer: Self-pay

## 2013-07-31 ENCOUNTER — Encounter (HOSPITAL_COMMUNITY)
Admission: RE | Admit: 2013-07-31 | Discharge: 2013-07-31 | Disposition: A | Discharge status: Home / Self Care | Payer: Medicare Other | Source: Ambulatory Visit | Attending: General Surgery | Admitting: General Surgery

## 2013-07-31 DIAGNOSIS — Z01812 Encounter for preprocedural laboratory examination: Secondary | ICD-10-CM | POA: Insufficient documentation

## 2013-07-31 HISTORY — DX: Unspecified cataract: H26.9

## 2013-07-31 HISTORY — DX: Unspecified urinary incontinence: R32

## 2013-07-31 HISTORY — DX: Other allergy status, other than to drugs and biological substances: Z91.09

## 2013-07-31 LAB — CBC
HEMATOCRIT: 45.1 % (ref 39.0–52.0)
Hemoglobin: 14.4 g/dL (ref 13.0–17.0)
MCH: 27.7 pg (ref 26.0–34.0)
MCHC: 31.9 g/dL (ref 30.0–36.0)
MCV: 86.7 fL (ref 78.0–100.0)
PLATELETS: 199 10*3/uL (ref 150–400)
RBC: 5.2 MIL/uL (ref 4.22–5.81)
RDW: 14.2 % (ref 11.5–15.5)
WBC: 9.1 10*3/uL (ref 4.0–10.5)

## 2013-07-31 LAB — BASIC METABOLIC PANEL
BUN: 24 mg/dL — ABNORMAL HIGH (ref 6–23)
CALCIUM: 8.8 mg/dL (ref 8.4–10.5)
CO2: 31 meq/L (ref 19–32)
Chloride: 102 mEq/L (ref 96–112)
Creatinine, Ser: 0.71 mg/dL (ref 0.50–1.35)
GFR calc non Af Amer: >90 mL/min (ref >90)
Glucose, Bld: 105 mg/dL — ABNORMAL HIGH (ref 70–99)
Potassium: 3.8 mEq/L (ref 3.7–5.3)
Sodium: 142 mEq/L (ref 137–147)

## 2013-07-31 LAB — ABO/RH: ABO/RH(D): O POS

## 2013-07-31 NOTE — Progress Notes (Signed)
07/31/13 1017  OBSTRUCTIVE SLEEP APNEA  Have you ever been diagnosed with sleep apnea through a sleep study? No  Do you snore loudly (loud enough to be heard through closed doors)?  1  Do you often feel tired, fatigued, or sleepy during the daytime? 0  Has anyone observed you stop breathing during your sleep? 0  Do you have, or are you being treated for high blood pressure? 1  BMI more than 35 kg/m2? 1  Age over 74 years old? 1  Neck circumference greater than 40 cm/16 inches? 1  Gender: 1  Obstructive Sleep Apnea Score 6  Score 4 or greater  Results sent to PCP

## 2013-08-01 ENCOUNTER — Ambulatory Visit: Payer: Medicare Other | Admitting: Family Medicine

## 2013-08-01 ENCOUNTER — Telehealth (INDEPENDENT_AMBULATORY_CARE_PROVIDER_SITE_OTHER): Payer: Self-pay

## 2013-08-01 NOTE — Telephone Encounter (Signed)
Pt wife calling to confirm instructions for the Miralax/gatorade prep. Per Dr Marcello Moores pt is to drink 32 oz and half the bottle of Miralax. Informed pt that of those instructions and  he should drink 8 ozs q 15-30 mins until finished. Wife verbalized understanding

## 2013-08-07 ENCOUNTER — Encounter (HOSPITAL_COMMUNITY): Payer: Self-pay | Admitting: Certified Registered"

## 2013-08-07 ENCOUNTER — Inpatient Hospital Stay (HOSPITAL_COMMUNITY): Payer: Medicare Other | Admitting: Certified Registered"

## 2013-08-07 ENCOUNTER — Encounter (HOSPITAL_COMMUNITY): Payer: Medicare Other | Admitting: Certified Registered"

## 2013-08-07 ENCOUNTER — Encounter (HOSPITAL_COMMUNITY)
Admission: RE | Disposition: A | Discharge status: Home / Self Care | Payer: Self-pay | Source: Ambulatory Visit | Attending: General Surgery

## 2013-08-07 ENCOUNTER — Inpatient Hospital Stay (HOSPITAL_COMMUNITY)
Admission: RE | Admit: 2013-08-07 | Discharge: 2013-08-11 | DRG: 330 | Disposition: A | Discharge status: Home / Self Care | Payer: Medicare Other | Source: Ambulatory Visit | Attending: General Surgery | Admitting: General Surgery

## 2013-08-07 DIAGNOSIS — Z8249 Family history of ischemic heart disease and other diseases of the circulatory system: Secondary | ICD-10-CM

## 2013-08-07 DIAGNOSIS — E876 Hypokalemia: Secondary | ICD-10-CM | POA: Diagnosis not present

## 2013-08-07 DIAGNOSIS — I70219 Atherosclerosis of native arteries of extremities with intermittent claudication, unspecified extremity: Secondary | ICD-10-CM | POA: Diagnosis present

## 2013-08-07 DIAGNOSIS — Z6841 Body Mass Index (BMI) 40.0 and over, adult: Secondary | ICD-10-CM

## 2013-08-07 DIAGNOSIS — E785 Hyperlipidemia, unspecified: Secondary | ICD-10-CM | POA: Diagnosis present

## 2013-08-07 DIAGNOSIS — Z01812 Encounter for preprocedural laboratory examination: Secondary | ICD-10-CM

## 2013-08-07 DIAGNOSIS — Z96649 Presence of unspecified artificial hip joint: Secondary | ICD-10-CM

## 2013-08-07 DIAGNOSIS — R197 Diarrhea, unspecified: Secondary | ICD-10-CM | POA: Diagnosis not present

## 2013-08-07 DIAGNOSIS — Z833 Family history of diabetes mellitus: Secondary | ICD-10-CM

## 2013-08-07 DIAGNOSIS — F3289 Other specified depressive episodes: Secondary | ICD-10-CM | POA: Diagnosis present

## 2013-08-07 DIAGNOSIS — D126 Benign neoplasm of colon, unspecified: Principal | ICD-10-CM | POA: Diagnosis present

## 2013-08-07 DIAGNOSIS — I10 Essential (primary) hypertension: Secondary | ICD-10-CM | POA: Diagnosis present

## 2013-08-07 DIAGNOSIS — Z87891 Personal history of nicotine dependence: Secondary | ICD-10-CM

## 2013-08-07 DIAGNOSIS — K635 Polyp of colon: Secondary | ICD-10-CM | POA: Diagnosis present

## 2013-08-07 DIAGNOSIS — Z7982 Long term (current) use of aspirin: Secondary | ICD-10-CM

## 2013-08-07 DIAGNOSIS — F329 Major depressive disorder, single episode, unspecified: Secondary | ICD-10-CM | POA: Diagnosis present

## 2013-08-07 DIAGNOSIS — N400 Enlarged prostate without lower urinary tract symptoms: Secondary | ICD-10-CM | POA: Diagnosis present

## 2013-08-07 HISTORY — PX: LAPAROSCOPIC PARTIAL COLECTOMY: SHX5907

## 2013-08-07 LAB — TYPE AND SCREEN
ABO/RH(D): O POS
ANTIBODY SCREEN: NEGATIVE

## 2013-08-07 SURGERY — LAPAROSCOPIC PARTIAL COLECTOMY
Anesthesia: General | Site: Abdomen

## 2013-08-07 MED ORDER — HYDROMORPHONE HCL PF 1 MG/ML IJ SOLN
INTRAMUSCULAR | Status: DC | PRN
  Administered 2013-08-07 (×2): 1 mg via INTRAVENOUS

## 2013-08-07 MED ORDER — CHLORHEXIDINE GLUCONATE 0.12 % MT SOLN
15.0000 mL | Freq: Two times a day (BID) | OROMUCOSAL | Status: DC
  Administered 2013-08-07 – 2013-08-10 (×4): 15 mL via OROMUCOSAL
  Filled 2013-08-07 (×10): qty 15

## 2013-08-07 MED ORDER — MORPHINE SULFATE 2 MG/ML IJ SOLN: 2.0000 mg | INTRAMUSCULAR | Status: DC | PRN

## 2013-08-07 MED ORDER — KCL IN DEXTROSE-NACL 20-5-0.45 MEQ/L-%-% IV SOLN
INTRAVENOUS | Status: DC
  Administered 2013-08-07 – 2013-08-08 (×2): via INTRAVENOUS
  Filled 2013-08-07 (×3): qty 1000

## 2013-08-07 MED ORDER — FENTANYL CITRATE 0.05 MG/ML IJ SOLN
INTRAMUSCULAR | Status: DC | PRN
  Administered 2013-08-07 (×3): 50 ug via INTRAVENOUS
  Administered 2013-08-07: 100 ug via INTRAVENOUS

## 2013-08-07 MED ORDER — ESCITALOPRAM OXALATE 10 MG PO TABS
10.0000 mg | ORAL_TABLET | Freq: Every morning | ORAL | Status: DC
  Administered 2013-08-08 – 2013-08-11 (×4): 10 mg via ORAL
  Filled 2013-08-07 (×4): qty 1

## 2013-08-07 MED ORDER — HYDROMORPHONE HCL PF 1 MG/ML IJ SOLN: 0.2500 mg | INTRAMUSCULAR | Status: DC | PRN

## 2013-08-07 MED ORDER — ONDANSETRON HCL 4 MG/2ML IJ SOLN: 4.0000 mg | Freq: Four times a day (QID) | INTRAMUSCULAR | Status: DC | PRN

## 2013-08-07 MED ORDER — LACTATED RINGERS IV SOLN
INTRAVENOUS | Status: AC
  Administered 2013-08-07: 1000 mL via INTRAVENOUS
  Administered 2013-08-07: 11:00:00 via INTRAVENOUS

## 2013-08-07 MED ORDER — DIPHENHYDRAMINE HCL 50 MG/ML IJ SOLN: 12.5000 mg | Freq: Four times a day (QID) | INTRAMUSCULAR | Status: DC | PRN

## 2013-08-07 MED ORDER — VITAMIN C 500 MG PO TABS
500.0000 mg | ORAL_TABLET | Freq: Two times a day (BID) | ORAL | Status: DC
  Administered 2013-08-07 – 2013-08-11 (×9): 500 mg via ORAL
  Filled 2013-08-07 (×11): qty 1

## 2013-08-07 MED ORDER — PROPOFOL 10 MG/ML IV BOLUS
INTRAVENOUS | Status: DC | PRN
  Administered 2013-08-07: 150 mg via INTRAVENOUS

## 2013-08-07 MED ORDER — GENTAMICIN SULFATE 40 MG/ML IJ SOLN
480.0000 mg | INTRAVENOUS | Status: AC
  Administered 2013-08-07: 480 mg via INTRAVENOUS
  Filled 2013-08-07: qty 12

## 2013-08-07 MED ORDER — ONDANSETRON HCL 4 MG/2ML IJ SOLN
INTRAMUSCULAR | Status: AC
  Filled 2013-08-07: qty 2

## 2013-08-07 MED ORDER — MIDAZOLAM HCL 2 MG/2ML IJ SOLN
INTRAMUSCULAR | Status: AC
  Filled 2013-08-07: qty 2

## 2013-08-07 MED ORDER — ONDANSETRON HCL 4 MG/2ML IJ SOLN
INTRAMUSCULAR | Status: DC | PRN
  Administered 2013-08-07: 4 mg via INTRAVENOUS

## 2013-08-07 MED ORDER — FINASTERIDE 5 MG PO TABS
5.0000 mg | ORAL_TABLET | Freq: Every day | ORAL | Status: DC
  Administered 2013-08-07 – 2013-08-10 (×4): 5 mg via ORAL
  Filled 2013-08-07 (×5): qty 1

## 2013-08-07 MED ORDER — BIOTENE DRY MOUTH MT LIQD
15.0000 mL | Freq: Two times a day (BID) | OROMUCOSAL | Status: DC
  Administered 2013-08-07 – 2013-08-08 (×2): 15 mL via OROMUCOSAL

## 2013-08-07 MED ORDER — DIPHENHYDRAMINE HCL 12.5 MG/5ML PO ELIX: 12.5000 mg | ORAL_SOLUTION | Freq: Four times a day (QID) | ORAL | Status: DC | PRN

## 2013-08-07 MED ORDER — SIMVASTATIN 10 MG PO TABS
10.0000 mg | ORAL_TABLET | Freq: Every day | ORAL | Status: DC
  Administered 2013-08-08 – 2013-08-10 (×3): 10 mg via ORAL
  Filled 2013-08-07 (×4): qty 1

## 2013-08-07 MED ORDER — HYDROMORPHONE HCL PF 2 MG/ML IJ SOLN
INTRAMUSCULAR | Status: AC
  Filled 2013-08-07: qty 1

## 2013-08-07 MED ORDER — ENOXAPARIN SODIUM 40 MG/0.4ML ~~LOC~~ SOLN
40.0000 mg | SUBCUTANEOUS | Status: DC
  Administered 2013-08-08 – 2013-08-11 (×4): 40 mg via SUBCUTANEOUS
  Filled 2013-08-07 (×6): qty 0.4

## 2013-08-07 MED ORDER — ACETAMINOPHEN 500 MG PO TABS
1000.0000 mg | ORAL_TABLET | Freq: Four times a day (QID) | ORAL | Status: AC
  Administered 2013-08-07 – 2013-08-08 (×4): 1000 mg via ORAL
  Filled 2013-08-07 (×4): qty 2

## 2013-08-07 MED ORDER — ROCURONIUM BROMIDE 100 MG/10ML IV SOLN
INTRAVENOUS | Status: AC
  Filled 2013-08-07: qty 1

## 2013-08-07 MED ORDER — ALVIMOPAN 12 MG PO CAPS
12.0000 mg | ORAL_CAPSULE | Freq: Once | ORAL | Status: AC
  Administered 2013-08-07: 12 mg via ORAL
  Filled 2013-08-07: qty 1

## 2013-08-07 MED ORDER — ROCURONIUM BROMIDE 100 MG/10ML IV SOLN
INTRAVENOUS | Status: DC | PRN
  Administered 2013-08-07: 50 mg via INTRAVENOUS
  Administered 2013-08-07: 10 mg via INTRAVENOUS

## 2013-08-07 MED ORDER — ONDANSETRON HCL 4 MG PO TABS
4.0000 mg | ORAL_TABLET | Freq: Four times a day (QID) | ORAL | Status: DC | PRN
Start: 2013-08-07 — End: 2013-08-11

## 2013-08-07 MED ORDER — HYDROCHLOROTHIAZIDE 25 MG PO TABS
25.0000 mg | ORAL_TABLET | Freq: Every morning | ORAL | Status: DC
  Administered 2013-08-07 – 2013-08-11 (×5): 25 mg via ORAL
  Filled 2013-08-07 (×5): qty 1

## 2013-08-07 MED ORDER — MIDAZOLAM HCL 5 MG/5ML IJ SOLN
INTRAMUSCULAR | Status: DC | PRN
  Administered 2013-08-07 (×2): 1 mg via INTRAVENOUS

## 2013-08-07 MED ORDER — TAMSULOSIN HCL 0.4 MG PO CAPS
0.4000 mg | ORAL_CAPSULE | Freq: Every day | ORAL | Status: DC
  Administered 2013-08-07 – 2013-08-10 (×4): 0.4 mg via ORAL
  Filled 2013-08-07 (×5): qty 1

## 2013-08-07 MED ORDER — CLINDAMYCIN PHOSPHATE 600 MG/50ML IV SOLN
600.0000 mg | Freq: Once | INTRAVENOUS | Status: DC
  Filled 2013-08-07: qty 50

## 2013-08-07 MED ORDER — LACTATED RINGERS IR SOLN
Status: DC | PRN
  Administered 2013-08-07: 1000 mL

## 2013-08-07 MED ORDER — BUPIVACAINE-EPINEPHRINE (PF) 0.25% -1:200000 IJ SOLN
INTRAMUSCULAR | Status: AC
  Filled 2013-08-07: qty 60

## 2013-08-07 MED ORDER — SUCCINYLCHOLINE CHLORIDE 20 MG/ML IJ SOLN
INTRAMUSCULAR | Status: DC | PRN
  Administered 2013-08-07: 140 mg via INTRAVENOUS

## 2013-08-07 MED ORDER — FENTANYL CITRATE 0.05 MG/ML IJ SOLN
INTRAMUSCULAR | Status: AC
  Filled 2013-08-07: qty 5

## 2013-08-07 MED ORDER — GLYCOPYRROLATE 0.2 MG/ML IJ SOLN
INTRAMUSCULAR | Status: DC | PRN
  Administered 2013-08-07: 0.6 mg via INTRAVENOUS

## 2013-08-07 MED ORDER — ALVIMOPAN 12 MG PO CAPS
12.0000 mg | ORAL_CAPSULE | Freq: Two times a day (BID) | ORAL | Status: DC
  Administered 2013-08-08: 12 mg via ORAL
  Filled 2013-08-07 (×2): qty 1

## 2013-08-07 MED ORDER — PROMETHAZINE HCL 25 MG/ML IJ SOLN: 6.2500 mg | INTRAMUSCULAR | Status: DC | PRN

## 2013-08-07 MED ORDER — BUPIVACAINE-EPINEPHRINE 0.25% -1:200000 IJ SOLN
INTRAMUSCULAR | Status: DC | PRN
  Administered 2013-08-07 (×2): 15 mL

## 2013-08-07 MED ORDER — DEXTROSE 5 % IV SOLN
2.0000 g | INTRAVENOUS | Status: DC
  Filled 2013-08-07: qty 2

## 2013-08-07 MED ORDER — GLYCOPYRROLATE 0.2 MG/ML IJ SOLN
INTRAMUSCULAR | Status: AC
  Filled 2013-08-07: qty 3

## 2013-08-07 MED ORDER — CLINDAMYCIN PHOSPHATE 900 MG/50ML IV SOLN
900.0000 mg | INTRAVENOUS | Status: AC
  Administered 2013-08-07: 900 mg via INTRAVENOUS

## 2013-08-07 MED ORDER — CLINDAMYCIN PHOSPHATE 900 MG/50ML IV SOLN
INTRAVENOUS | Status: AC
  Filled 2013-08-07: qty 50

## 2013-08-07 MED ORDER — PROPOFOL 10 MG/ML IV BOLUS
INTRAVENOUS | Status: AC
  Filled 2013-08-07: qty 20

## 2013-08-07 MED ORDER — LACTATED RINGERS IV SOLN: INTRAVENOUS | Status: DC

## 2013-08-07 MED ORDER — NEOSTIGMINE METHYLSULFATE 10 MG/10ML IV SOLN
INTRAVENOUS | Status: DC | PRN
  Administered 2013-08-07: 4 mg via INTRAVENOUS

## 2013-08-07 SURGICAL SUPPLY — 87 items
APPLIER CLIP 5 13 M/L LIGAMAX5 (MISCELLANEOUS)
APR CLP MED LRG 5 ANG JAW (MISCELLANEOUS)
BLADE EXTENDED COATED 6.5IN (ELECTRODE) ×5 IMPLANT
BLADE HEX COATED 2.75 (ELECTRODE) ×6 IMPLANT
BLADE SURG SZ10 CARB STEEL (BLADE) ×3 IMPLANT
CABLE HIGH FREQUENCY MONO STRZ (ELECTRODE) IMPLANT
CANISTER SUCTION 2500CC (MISCELLANEOUS) ×1 IMPLANT
CELLS DAT CNTRL 66122 CELL SVR (MISCELLANEOUS) ×1 IMPLANT
CHLORAPREP W/TINT 26ML (MISCELLANEOUS) ×3 IMPLANT
CLIP APPLIE 5 13 M/L LIGAMAX5 (MISCELLANEOUS) IMPLANT
COVER MAYO STAND STRL (DRAPES) ×2 IMPLANT
DECANTER SPIKE VIAL GLASS SM (MISCELLANEOUS) ×1 IMPLANT
DRAIN CHANNEL 19F RND (DRAIN) IMPLANT
DRAPE LAPAROSCOPIC ABDOMINAL (DRAPES) ×5 IMPLANT
DRAPE LG THREE QUARTER DISP (DRAPES) ×5 IMPLANT
DRAPE UTILITY XL STRL (DRAPES) ×8 IMPLANT
DRAPE WARM FLUID 44X44 (DRAPE) ×5 IMPLANT
DRSG OPSITE POSTOP 3X4 (GAUZE/BANDAGES/DRESSINGS) ×2 IMPLANT
DRSG OPSITE POSTOP 4X10 (GAUZE/BANDAGES/DRESSINGS) ×1 IMPLANT
DRSG OPSITE POSTOP 4X6 (GAUZE/BANDAGES/DRESSINGS) IMPLANT
DRSG OPSITE POSTOP 4X8 (GAUZE/BANDAGES/DRESSINGS) ×2 IMPLANT
ELECT REM PT RETURN 9FT ADLT (ELECTROSURGICAL) ×3
ELECTRODE REM PT RTRN 9FT ADLT (ELECTROSURGICAL) ×1 IMPLANT
EVACUATOR SILICONE 100CC (DRAIN) IMPLANT
GLOVE BIO SURGEON STRL SZ 6.5 (GLOVE) ×7 IMPLANT
GLOVE BIO SURGEONS STRL SZ 6.5 (GLOVE) ×5
GLOVE BIOGEL PI IND STRL 7.0 (GLOVE) ×2 IMPLANT
GLOVE BIOGEL PI INDICATOR 7.0 (GLOVE) ×14
GOWN STRL REIN 2XL XLG LVL4 (GOWN DISPOSABLE) ×10 IMPLANT
GOWN STRL REUS W/TWL XL LVL3 (GOWN DISPOSABLE) ×20 IMPLANT
KIT BASIN OR (CUSTOM PROCEDURE TRAY) ×9 IMPLANT
LEGGING LITHOTOMY PAIR STRL (DRAPES) IMPLANT
LIGASURE IMPACT 36 18CM CVD LR (INSTRUMENTS) IMPLANT
MANIFOLD NEPTUNE II (INSTRUMENTS) ×4 IMPLANT
NS IRRIG 1000ML POUR BTL (IV SOLUTION) ×5 IMPLANT
PACK GENERAL/GYN (CUSTOM PROCEDURE TRAY) ×5 IMPLANT
PENCIL BUTTON HOLSTER BLD 10FT (ELECTRODE) ×3 IMPLANT
RELOAD PROXIMATE 75MM BLUE (ENDOMECHANICALS) ×6 IMPLANT
RELOAD STAPLE 75 3.8 BLU REG (ENDOMECHANICALS) IMPLANT
RETRACTOR WND ALEXIS 18 MED (MISCELLANEOUS) IMPLANT
RTRCTR WOUND ALEXIS 18CM MED (MISCELLANEOUS) ×3
SCISSORS LAP 5X35 DISP (ENDOMECHANICALS) ×5 IMPLANT
SEALER TISSUE G2 CVD JAW 35 (ENDOMECHANICALS) IMPLANT
SEALER TISSUE G2 CVD JAW 45CM (ENDOMECHANICALS) ×2
SEALER TISSUE G2 STRG ARTC 35C (ENDOMECHANICALS) IMPLANT
SET IRRIG TUBING LAPAROSCOPIC (IRRIGATION / IRRIGATOR) ×5 IMPLANT
SLEEVE XCEL OPT CAN 5 100 (ENDOMECHANICALS) ×7 IMPLANT
SOLUTION ANTI FOG 6CC (MISCELLANEOUS) ×5 IMPLANT
SPONGE GAUZE 4X4 12PLY (GAUZE/BANDAGES/DRESSINGS) ×1 IMPLANT
SPONGE LAP 18X18 X RAY DECT (DISPOSABLE) ×8 IMPLANT
STAPLER GUN LINEAR PROX 60 (STAPLE) ×2 IMPLANT
STAPLER PROXIMATE 75MM BLUE (STAPLE) ×2 IMPLANT
STAPLER VISISTAT 35W (STAPLE) ×2 IMPLANT
SUCTION POOLE TIP (SUCTIONS) ×5 IMPLANT
SUT ETHILON 2 0 PS N (SUTURE) IMPLANT
SUT NOVA 1 T20/GS 25DT (SUTURE) ×2 IMPLANT
SUT NOVA NAB DX-16 0-1 5-0 T12 (SUTURE) ×3 IMPLANT
SUT NOVA NAB GS-21 1 T12 (SUTURE) ×6 IMPLANT
SUT PDS AB 1 CTX 36 (SUTURE) IMPLANT
SUT PDS AB 1 TP1 96 (SUTURE) IMPLANT
SUT PROLENE 2 0 KS (SUTURE) IMPLANT
SUT SILK 2 0 (SUTURE) ×6
SUT SILK 2 0 SH CR/8 (SUTURE) ×6 IMPLANT
SUT SILK 2-0 18XBRD TIE 12 (SUTURE) ×1 IMPLANT
SUT SILK 3 0 (SUTURE) ×6
SUT SILK 3 0 SH CR/8 (SUTURE) ×5 IMPLANT
SUT SILK 3-0 18XBRD TIE 12 (SUTURE) ×1 IMPLANT
SUT VIC AB 2-0 SH 18 (SUTURE) IMPLANT
SUT VIC AB 4-0 PS2 18 (SUTURE) ×2 IMPLANT
SUT VIC AB 4-0 PS2 27 (SUTURE) ×5 IMPLANT
SUT VICRYL 2 0 18  UND BR (SUTURE)
SUT VICRYL 2 0 18 UND BR (SUTURE) IMPLANT
SYR BULB IRRIGATION 50ML (SYRINGE) IMPLANT
SYS LAPSCP GELPORT 120MM (MISCELLANEOUS)
SYSTEM LAPSCP GELPORT 120MM (MISCELLANEOUS) IMPLANT
TOWEL OR 17X26 10 PK STRL BLUE (TOWEL DISPOSABLE) ×6 IMPLANT
TOWEL OR NON WOVEN STRL DISP B (DISPOSABLE) ×8 IMPLANT
TRAY FOLEY CATH 14FRSI W/METER (CATHETERS) ×1 IMPLANT
TRAY FOLEY CATH 16FR SILVER (SET/KITS/TRAYS/PACK) ×2 IMPLANT
TRAY LAP CHOLE (CUSTOM PROCEDURE TRAY) ×5 IMPLANT
TROCAR BLADELESS OPT 5 100 (ENDOMECHANICALS) ×3 IMPLANT
TROCAR XCEL 12X100 BLDLESS (ENDOMECHANICALS) IMPLANT
TROCAR XCEL BLUNT TIP 100MML (ENDOMECHANICALS) ×5 IMPLANT
TROCAR XCEL NON-BLD 11X100MML (ENDOMECHANICALS) IMPLANT
TUBING FILTER THERMOFLATOR (ELECTROSURGICAL) ×5 IMPLANT
TUBING INSUFFLATION 10FT LAP (TUBING) ×5 IMPLANT
YANKAUER SUCT BULB TIP 10FT TU (MISCELLANEOUS) ×7 IMPLANT

## 2013-08-07 NOTE — Op Note (Addendum)
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   INDICATIONS: This is a 73 year old male who presented to my office with an endoscopically unresectable polyp at the ileocecal valve.    OR FINDINGS: The risk and benefits and alternative treatments were explained to the patient prior to the OR and the patient has elected to proceed with laparoscopic colectomy. The risks and benefits of this procedure were explained to the patient prior to the OR consent was signed and placed on chart.  DESCRIPTION:   The patient was identified & brought into the operating room. The patient was positioned supine with left arm tucked. SCDs were active during the entire case. The patient underwent general anesthesia without any difficulty.  A foley catheter was inserted under sterile conditions. The abdomen was prepped and draped in a sterile fashion. A Surgical Timeout confirmed our plan.   I made a vertical incision through the inferior umbilical fold.  I made a nick in the infraumbilical fascia and confirmed peritoneal entry.  I placed a stay suture and then the [facility] port.  We induced carbon dioxide insufflation.  Camera inspection revealed no injury.  I placed additional ports under direct laparoscopic visualization. I began by identifying the ileocolic artery and vein within the mesentery. Dissection was bluntly carried around these structures. The duodenum  was identified and free from the structures. I then separated the structures bluntly and used the Enseal device to transect these appropriately.  I freed the appendix off its attachments to the ascending colon and cecal mesentery. I mobilized the terminal ileum.  After this I began to mobilize laterally taking down the hepatic flexure using the Enseal device. I mobilized the omentum off of the right transverse colon. The entire colon was then flipped medially.    I took care to avoid injuring any retroperitoneal structures. Once the entire right colon was mobilized off of the retroperitoneal structures and I could visualize the lateral edge of the duodenum underneath, I made an enlargement of my umbilical incision using a scalpel.  The fascia was then incised using electrocautery. An alexis wound protector was placed.  The terminal ileum and right colon were then removed from the wound.  The terminal ileum was transected using a GIA blue load stapler. The mesentery was divided using the Enseal device.  I identified a portion of the right colon just distal to the hepatic flexure. An anastomosis was created between the terminal ileum and the transverse colon here. This was done using a GIA blue load stapler. The remaining colon was transected using another blue load GIA stapler. The common enterotomy channel was closed using a TA 60 blue load stapler. Hemostasis was good at the staple line. Several 3-0 silk sutures were used to imbricate the edge of the anastomosis. An anti-tension suture was placed in the crotch of the anastomosis. This was then placed back into the abdomen. The abdomen was then  irrigated with normal saline. The omentum was then brought down over the anastomosis. The Alexis wound connector was removed and we switched to clean instruments, gowns and drapes. The fascia was then closed using 0 Novafil interrupted sutures. The abdomen was then insufflated and evaluated for hemostasis. The right pericolic  gutter appeared to be clean. Hemostasis was good. The abdomen was then desufflated and the subcutaneous tissue of the umbilical incision was closed using interrupted 2-0 Vicryl sutures. The skin was then closed using 4-0 Monocryl sutures. Dermabond was placed on the port sites and a sterile dressing was placed over the abdominal incision. All counts were correct per operating room staff. The patient was then awakened from anesthesia and sent to the post anesthesia care unit in stable condition.

## 2013-08-07 NOTE — Anesthesia Postprocedure Evaluation (Signed)
Anesthesia Post Note  Patient: Gabriel Love  Procedure(s) Performed: Procedure(s) (LRB): LAPAROSCOPIC right PARTIAL COLECTOMY (N/A)  Anesthesia type: General  Patient location: PACU  Post pain: Pain level controlled  Post assessment: Post-op Vital signs reviewed  Last Vitals:  Filed Vitals:   08/07/13 1349  BP: 127/74  Pulse: 76  Temp: 36.6 C  Resp: 16    Post vital signs: Reviewed  Level of consciousness: sedated  Complications: No apparent anesthesia complications

## 2013-08-07 NOTE — H&P (Signed)
Chief Complaint   Patient presents with   .  Mass   HISTORY:  Gabriel Love is a 73 y.o. male who presents to the office with a colon mass. This was found on colonoscopy. Workup thus far has included CT scans, which are neg for metastatic disease limited due to body habitus. CEA was normal. Pt denies any weight loss. Denies any changes in bowel habits or blood in stool. He has no FH of colon cancer. He had several other SSA's resected during colonoscopy. Biopsy of mass shows adenoma but endoscopist is concerned for underlying cancer. He denies any dyspnea on exertion or chest pain with activity. He has limited mobility due to arthritis in his hip.  Past Medical History   Diagnosis  Date   .  Hyperlipidemia    .  Hypertension    .  Prostate hypertrophy      Benign   .  Depression    .  DJD (degenerative joint disease)     Past Surgical History   Procedure  Laterality  Date   .  Circumcision, non-newborn     .  Joint replacement   12/2006     total hip replacement left hip    Current Outpatient Prescriptions   Medication  Sig  Dispense  Refill   .  aspirin 325 MG EC tablet  Take 325 mg by mouth daily.     .  clotrimazole-betamethasone (LOTRISONE) cream      .  finasteride (PROSCAR) 5 MG tablet  Take 5 mg by mouth at bedtime.     .  hydrochlorothiazide (HYDRODIURIL) 25 MG tablet  take 1 tablet once daily  90 tablet  3   .  LEXAPRO 10 MG tablet  TAKE ONE TABLET BY MOUTH ONCE DAILY  30 tablet  5   .  lovastatin (MEVACOR) 20 MG tablet  take 1 tablet by mouth once daily  30 tablet  11   .  peg 3350 powder (MOVIPREP) 100 G SOLR  Take 1 kit (200 g total) by mouth once.  1 kit  0   .  tamsulosin (FLOMAX) 0.4 MG CAPS capsule  Take 0.4 mg by mouth at bedtime.     .  vitamin C (ASCORBIC ACID) 500 MG tablet  Take 500 mg by mouth daily.     .  metroNIDAZOLE (FLAGYL) 500 MG tablet  Take 1 tablet (500 mg total) by mouth as directed.  3 tablet  0    No current facility-administered medications for  this visit.    Allergies   Allergen  Reactions   .  Ivp Dye [Iodinated Diagnostic Agents]  Anaphylaxis     Iv dye allergy in 1985, anaphylaxix//a.calhoun   .  Penicillins      REACTION: urticaria    Family History   Problem  Relation  Age of Onset   .  Hypertension  Mother    .  Diabetes  Mother    .  Heart failure  Mother      cad/mi with rupture ventricle   .  Hyperlipidemia  Father    .  Cancer  Neg Hx      colon or prostate    History    Social History   .  Marital Status:  Married     Spouse Name:  N/A     Number of Children:  1   .  Years of Education:  N/A    Occupational History   .  Retired     Social History Main Topics   .  Smoking status:  Former Smoker     Quit date:  03/22/1968   .  Smokeless tobacco:  Former Systems developer   .  Alcohol Use:  No   .  Drug Use:  No   .  Sexual Activity:  None    Other Topics  Concern   .  None    Social History Narrative    Married 1965 Very supportive wife       One daughter 42 One grandchild (girl 2002)    Retired from Geophysical data processor work         REVIEW OF SYSTEMS - [place]:  Review of Systems - General ROS: negative for - chills or fever  Hematological and Lymphatic ROS: negative for - bleeding problems or blood clots  Respiratory ROS: no cough, shortness of breath, or wheezing  Cardiovascular ROS: no chest pain or dyspnea on exertion  Gastrointestinal ROS: no abdominal pain, change in bowel habits, or black or bloody stools  Genito-Urinary ROS: no dysuria, trouble voiding, or hematuria  EXAM:  Filed Vitals:   08/07/13 0720  BP: 146/78  Pulse: 74  Temp: 97.6 F (36.4 C)  Resp: 18   Gen: No acute distress. Well nourished and well groomed.  Neurological: Alert and oriented to person, place, and time. Coordination normal.  Head: Normocephalic and atraumatic.  Eyes: Conjunctivae are normal. Pupils are equal, round, and reactive to light. No scleral icterus.  Neck: Normal range of motion.  Neck supple. No tracheal deviation or thyromegaly present. No cervical lymphadenopathy.  Cardiovascular: Normal rate, regular rhythm, normal heart sounds and intact distal pulses.  Respiratory: Effort normal. No respiratory distress. No chest wall tenderness. Breath sounds normal. No wheezes, rales or rhonchi.  GI: Soft. Bowel sounds are normal. The abdomen is soft and nontender. There is no rebound and no guarding.  Musculoskeletal: Normal range of motion. Extremities are nontender.  Skin: Skin is warm and dry. No rash noted. No diaphoresis. No erythema. No pallor. No clubbing, cyanosis, or edema.  Psychiatric: Normal mood and affect. Behavior is normal. Judgment and thought content normal.  LABORATORY RESULTS:  Lab Results   Component  Value  Date    CEA  1.5  06/19/2013    Lab Results   Component  Value  Date    CREATININE  0.9  06/06/2013   RADIOLOGY RESULTS:  Images and reports are reviewed.  CT CHEST ABD AND PELVIS IMPRESSION:  Very limited examination due to body habitus. No obvious colon cancer is identified. I do not see any definite findings for metastatic disease but the exam is quite limited.  ASSESSMENT AND PLAN:  Gabriel Love is a 73 y.o. 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 The CT scans are limited but showed no signs of metastatic disease. His CEA is normal. I recommended that he undergo a laparoscopic right colectomy. We may then assess the mass further for cancer and lymphatic spread. We discussed the surgery in detail. All questions were answered. I would like him to be evaluated by his primary care physician prior to surgery for medical evaluation.  The surgery and anatomy were described to the patient as well as the risks of surgery and the possible complications. These include: Bleeding, infection and possible wound complications such as hernia, damage to adjacent structures, leak of surgical connections, which can lead to other  surgeries  and possibly an ostomy (5-7%), possible need for other procedures, such as abscess drains in radiology, possible prolonged hospital stay, possible diarrhea from removal of part of the colon, possible constipation from narcotics, prolonged fatigue/weakness or appetite loss, possible early recurrence of cancer, possible complications of their medical problems such as heart disease or arrhythmias or lung problems, death (less than 1%). I believe the patient understands and wishes to proceed with the surgery.

## 2013-08-07 NOTE — Transfer of Care (Signed)
Immediate Anesthesia Transfer of Care Note  Patient: Gabriel Love  Procedure(s) Performed: Procedure(s) (LRB): LAPAROSCOPIC right PARTIAL COLECTOMY (N/A)  Patient Location: PACU  Anesthesia Type: General  Level of Consciousness: sedated, patient cooperative and responds to stimulation  Airway & Oxygen Therapy: Patient Spontanous Breathing and Patient connected to face mask oxgen  Post-op Assessment: Report given to PACU RN and Post -op Vital signs reviewed and stable  Post vital signs: Reviewed and stable  Complications: No apparent anesthesia complications

## 2013-08-07 NOTE — Anesthesia Preprocedure Evaluation (Addendum)
Anesthesia Evaluation  Patient identified by MRN, date of birth, ID band Patient awake    Reviewed: Allergy & Precautions, H&P , NPO status , Patient's Chart, lab work & pertinent test results  Airway Mallampati: III TM Distance: >3 FB Neck ROM: Full    Dental  (+) Edentulous Upper, Edentulous Lower   Pulmonary neg pulmonary ROS, former smoker,  breath sounds clear to auscultation  Pulmonary exam normal       Cardiovascular hypertension, Pt. on medications + Peripheral Vascular Disease Rhythm:Regular Rate:Normal     Neuro/Psych Depression negative neurological ROS  negative psych ROS   GI/Hepatic negative GI ROS, Neg liver ROS,   Endo/Other  Morbid obesity  Renal/GU negative Renal ROS  negative genitourinary   Musculoskeletal negative musculoskeletal ROS (+)   Abdominal   Peds  Hematology negative hematology ROS (+)   Anesthesia Other Findings   Reproductive/Obstetrics                          Anesthesia Physical Anesthesia Plan  ASA: III  Anesthesia Plan: General   Post-op Pain Management:    Induction: Intravenous  Airway Management Planned: Oral ETT  Additional Equipment:   Intra-op Plan:   Post-operative Plan: Extubation in OR  Informed Consent: I have reviewed the patients History and Physical, chart, labs and discussed the procedure including the risks, benefits and alternatives for the proposed anesthesia with the patient or authorized representative who has indicated his/her understanding and acceptance.   Dental advisory given  Plan Discussed with: CRNA  Anesthesia Plan Comments:         Anesthesia Quick Evaluation

## 2013-08-08 LAB — BASIC METABOLIC PANEL
BUN: 13 mg/dL (ref 6–23)
CHLORIDE: 100 meq/L (ref 96–112)
CO2: 31 meq/L (ref 19–32)
Calcium: 8.1 mg/dL — ABNORMAL LOW (ref 8.4–10.5)
Creatinine, Ser: 0.73 mg/dL (ref 0.50–1.35)
GFR calc Af Amer: >90 mL/min (ref >90)
GFR calc non Af Amer: 89 mL/min — ABNORMAL LOW (ref >90)
Glucose, Bld: 114 mg/dL — ABNORMAL HIGH (ref 70–99)
POTASSIUM: 3.3 meq/L — AB (ref 3.7–5.3)
SODIUM: 140 meq/L (ref 137–147)

## 2013-08-08 LAB — CBC
HCT: 41.7 % (ref 39.0–52.0)
HEMOGLOBIN: 13.5 g/dL (ref 13.0–17.0)
MCH: 28.4 pg (ref 26.0–34.0)
MCHC: 32.4 g/dL (ref 30.0–36.0)
MCV: 87.6 fL (ref 78.0–100.0)
Platelets: 194 10*3/uL (ref 150–400)
RBC: 4.76 MIL/uL (ref 4.22–5.81)
RDW: 14.3 % (ref 11.5–15.5)
WBC: 13 10*3/uL — ABNORMAL HIGH (ref 4.0–10.5)

## 2013-08-08 MED ORDER — POTASSIUM CHLORIDE 10 MEQ/100ML IV SOLN
10.0000 meq | INTRAVENOUS | Status: AC
  Administered 2013-08-08 (×4): 10 meq via INTRAVENOUS
  Filled 2013-08-08 (×4): qty 100

## 2013-08-08 NOTE — Progress Notes (Signed)
PT Cancellation Note  Patient Details Name: Gabriel Love MRN: [id number] DOB: March 28, 1940   Cancelled Treatment:    Reason Eval/Treat Not Completed: Medical issues which prohibited therapy (at 1200 pt having pain at IV site, now just ambulated with nursing. Will check back 5/21. May not need PT.)   Shella Maxim [facility] 08/08/2013, 3:45 PM Tresa Endo PT [phone]

## 2013-08-08 NOTE — Progress Notes (Signed)
1 Day Post-Op Lap R colectomy Subjective: Doing well.  Passing flatus.  No nausea  Objective: Vital signs in last 24 hours: Temp:  [97.4 F (36.3 C)-98.3 F (36.8 C)] 98.3 F (36.8 C) (05/20 1000) Pulse Rate:  [66-95] 66 (05/20 1000) Resp:  [16-18] 18 (05/20 1000) BP: (101-143)/(70-85) 143/70 mmHg (05/20 1000) SpO2:  [94 %-98 %] 96 % (05/20 1000) Weight:  [333 lb (151.048 kg)] 333 lb (151.048 kg) (05/19 1402)   Intake/Output from previous day: 05/19 0701 - 05/20 0700 In: 2317.5 [I.V.:2317.5] Out: 1941 [Urine:1185; Blood:100] Intake/Output this shift: Total I/O In: -  Out: 600 [Urine:600]   General appearance: alert and cooperative GI: normal findings: soft, non-tender  Incision: bloody drainage, changed at [facility] with Pt's RN.  No active bleeding noted.  Lab Results:   Recent Labs  08/08/13 0514  WBC 13.0*  HGB 13.5  HCT 41.7  PLT 194   BMET  Recent Labs  08/08/13 0514  NA 140  K 3.3*  CL 100  CO2 31  GLUCOSE 114*  BUN 13  CREATININE 0.73  CALCIUM 8.1*   PT/INR No results found for this basename: LABPROT, INR,  in the last 72 hours ABG No results found for this basename: PHART, PCO2, PO2, HCO3,  in the last 72 hours  MEDS, Scheduled . alvimopan  12 mg Oral BID  . antiseptic oral rinse  15 mL Mouth Rinse q12n4p  . chlorhexidine  15 mL Mouth Rinse BID  . enoxaparin (LOVENOX) injection  40 mg Subcutaneous Q24H  . escitalopram  10 mg Oral q morning - 10a  . finasteride  5 mg Oral QHS  . hydrochlorothiazide  25 mg Oral q morning - 10a  . simvastatin  10 mg Oral q1800  . tamsulosin  0.4 mg Oral QHS  . vitamin C  500 mg Oral BID    Studies/Results: No results found.  Assessment: s/p Procedure(s): LAPAROSCOPIC right PARTIAL COLECTOMY Patient Active Problem List   Diagnosis Date Noted  . Colon polyp 08/07/2013  . 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 Nonspecific abnormal finding  in stool contents 05/29/2013  . Atherosclerotic peripheral vascular disease with intermittent claudication 06/14/2012  . Spinal stenosis of lumbar region 06/14/2012  . 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 PHIMOSIS 01/30/2008  . YEAST BALANITIS 10/17/2007  . HYPERLIPIDEMIA 11/23/2006  . OBESITY NOS 11/23/2006  . DEPRESSION 11/23/2006  . HYPERTENSION 11/23/2006  . HEMATURIA 11/23/2006  . BENIGN PROSTATIC HYPERTROPHY 11/23/2006  . PROSTATITIS NOS 11/23/2006  . OSTEOARTHRITIS, HIP, LEFT 11/23/2006    Doing well  Plan: Advance diet to fulls Minimize IVF's Hypokalemia: Replace K 15meq IV KCL, recheck in AM   LOS: 1 day     .Rosario Adie, [facility], [place]   08/08/2013 1:29 PM

## 2013-08-08 NOTE — Progress Notes (Signed)
CARE MANAGEMENT NOTE 08/08/2013  Patient:  Gabriel Love, Gabriel Love   Account Number:  [id number]  Date Initiated:  08/08/2013  Documentation initiated by:  Gabriel Earing  Subjective/Objective Assessment:   Pt admitted for surgery(Colon Polyp)     Action/Plan:   from home   Anticipated DC Date:  08/10/2013   Anticipated DC Plan:  [place]  CM consult      Choice offered to / List presented to:             Status of service:  In process, will continue to follow Medicare Important Message given?  NA - LOS <3 / Initial given by admissions (If response is "NO", the following Medicare IM given date fields will be blank) Date Medicare IM given:   Date Additional Medicare IM given:    Discharge Disposition:    Per UR Regulation:  Reviewed for med. necessity/level of care/duration of stay  If discussed at [place] of Stay Meetings, dates discussed:    Comments:  08/08/13 MMcGibboney, RN, BSN UR completed.

## 2013-08-09 ENCOUNTER — Encounter (HOSPITAL_COMMUNITY): Payer: Self-pay | Admitting: General Surgery

## 2013-08-09 LAB — BASIC METABOLIC PANEL
BUN: 9 mg/dL (ref 6–23)
CO2: 29 mEq/L (ref 19–32)
CREATININE: 0.66 mg/dL (ref 0.50–1.35)
Calcium: 9 mg/dL (ref 8.4–10.5)
Chloride: 97 mEq/L (ref 96–112)
Glucose, Bld: 98 mg/dL (ref 70–99)
Potassium: 3.7 mEq/L (ref 3.7–5.3)
SODIUM: 137 meq/L (ref 137–147)

## 2013-08-09 LAB — CBC
HEMATOCRIT: 43.8 % (ref 39.0–52.0)
HEMOGLOBIN: 14 g/dL (ref 13.0–17.0)
MCH: 27.9 pg (ref 26.0–34.0)
MCHC: 32 g/dL (ref 30.0–36.0)
MCV: 87.3 fL (ref 78.0–100.0)
Platelets: 189 10*3/uL (ref 150–400)
RBC: 5.02 MIL/uL (ref 4.22–5.81)
RDW: 14.3 % (ref 11.5–15.5)
WBC: 12.7 10*3/uL — ABNORMAL HIGH (ref 4.0–10.5)

## 2013-08-09 NOTE — Progress Notes (Signed)
Pt refused foley catheter to be removed until he talked to MD. Pt is worried because he has leakage problems. I explained to pt increase risk for infection and that we could have urinal at bedside. Pt still wants to wait. Nelva Nay [place]

## 2013-08-09 NOTE — Progress Notes (Signed)
PT Cancellation Note / Screen  Patient Details Name: Gabriel Love MRN: [id number] DOB: 01/26/1941   Cancelled Treatment:    Reason Eval/Treat Not Completed: PT screened, no needs identified, will sign off Spoke with pt at bedside regarding PT services and he reports he has no needs at this time.     Junius Argyle 08/09/2013, 2:34 PM Carmelia Bake, PT, DPT 08/09/2013 Pager: [phone]

## 2013-08-09 NOTE — Progress Notes (Signed)
2 Days Post-Op Lap R colectomy Subjective: Doing well.  Passing flatus and having BM's.  No nausea.  Foley out and urinating well  Objective: Vital signs in last 24 hours: Temp:  [98 F (36.7 C)-99.6 F (37.6 C)] 98.3 F (36.8 C) (05/21 0610) Pulse Rate:  [63-87] 87 (05/21 0610) Resp:  [16-18] 18 (05/21 0610) BP: (126-142)/(74-83) 132/79 mmHg (05/21 0610) SpO2:  [95 %-98 %] 96 % (05/21 0610)   Intake/Output from previous day: 05/20 0701 - 05/21 0700 In: 1497.9 [I.V.:1097.9; IV Piggyback:400] Out: 3600 [Urine:3600] Intake/Output this shift:   General appearance: alert and cooperative GI: normal findings: soft, non-tender  Incision: bloody drainage noted, no signs of infection  Lab Results:   Recent Labs  08/08/13 0514 08/09/13 0445  WBC 13.0* 12.7*  HGB 13.5 14.0  HCT 41.7 43.8  PLT 194 189   BMET  Recent Labs  08/08/13 0514 08/09/13 0445  NA 140 137  K 3.3* 3.7  CL 100 97  CO2 31 29  GLUCOSE 114* 98  BUN 13 9  CREATININE 0.73 0.66  CALCIUM 8.1* 9.0   PT/INR No results found for this basename: LABPROT, INR,  in the last 72 hours ABG No results found for this basename: PHART, PCO2, PO2, HCO3,  in the last 72 hours  MEDS, Scheduled . antiseptic oral rinse  15 mL Mouth Rinse q12n4p  . chlorhexidine  15 mL Mouth Rinse BID  . enoxaparin (LOVENOX) injection  40 mg Subcutaneous Q24H  . escitalopram  10 mg Oral q morning - 10a  . finasteride  5 mg Oral QHS  . hydrochlorothiazide  25 mg Oral q morning - 10a  . simvastatin  10 mg Oral q1800  . tamsulosin  0.4 mg Oral QHS  . vitamin C  500 mg Oral BID    Studies/Results: No results found.  Assessment: s/p Procedure(s): LAPAROSCOPIC right PARTIAL COLECTOMY Patient Active Problem List   Diagnosis Date Noted  . Colon polyp 08/07/2013  . 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 Nonspecific abnormal finding in stool contents 05/29/2013  .  Atherosclerotic peripheral vascular disease with intermittent claudication 06/14/2012  . Spinal stenosis of lumbar region 06/14/2012  . 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 PHIMOSIS 01/30/2008  . YEAST BALANITIS 10/17/2007  . HYPERLIPIDEMIA 11/23/2006  . OBESITY NOS 11/23/2006  . DEPRESSION 11/23/2006  . HYPERTENSION 11/23/2006  . HEMATURIA 11/23/2006  . BENIGN PROSTATIC HYPERTROPHY 11/23/2006  . PROSTATITIS NOS 11/23/2006  . OSTEOARTHRITIS, HIP, LEFT 11/23/2006    Doing well  Plan: Advance to soft diet SL IVF's Hypokalemia: resolved Anticipate d/c in AM   LOS: 2 days     .Rosario Adie, MD [facility], [place] [phone]   08/09/2013 12:31 PM

## 2013-08-10 LAB — CBC
HEMATOCRIT: 40.6 % (ref 39.0–52.0)
Hemoglobin: 13.3 g/dL (ref 13.0–17.0)
MCH: 28 pg (ref 26.0–34.0)
MCHC: 32.8 g/dL (ref 30.0–36.0)
MCV: 85.5 fL (ref 78.0–100.0)
Platelets: 210 10*3/uL (ref 150–400)
RBC: 4.75 MIL/uL (ref 4.22–5.81)
RDW: 14.2 % (ref 11.5–15.5)
WBC: 10.7 10*3/uL — ABNORMAL HIGH (ref 4.0–10.5)

## 2013-08-10 LAB — POTASSIUM: POTASSIUM: 3.6 meq/L — AB (ref 3.7–5.3)

## 2013-08-10 MED ORDER — POTASSIUM CHLORIDE CRYS ER 20 MEQ PO TBCR: 40.0000 meq | EXTENDED_RELEASE_TABLET | Freq: Once | ORAL | Status: DC

## 2013-08-10 MED ORDER — CALCIUM POLYCARBOPHIL 625 MG PO TABS
625.0000 mg | ORAL_TABLET | Freq: Every day | ORAL | Status: DC
  Administered 2013-08-10 – 2013-08-11 (×2): 625 mg via ORAL
  Filled 2013-08-10 (×2): qty 1

## 2013-08-10 MED ORDER — POTASSIUM CHLORIDE CRYS ER 20 MEQ PO TBCR
40.0000 meq | EXTENDED_RELEASE_TABLET | Freq: Two times a day (BID) | ORAL | Status: AC
  Administered 2013-08-10 (×2): 40 meq via ORAL
  Filled 2013-08-10 (×2): qty 2

## 2013-08-10 NOTE — Care Management Note (Signed)
    Page 1 of 1   08/10/2013     1:54:17 PM CARE MANAGEMENT NOTE 08/10/2013  Patient:  Gabriel Love, Gabriel Love   Account Number:  [id number]  Date Initiated:  08/08/2013  Documentation initiated by:  Gabriel Earing  Subjective/Objective Assessment:   Pt admitted for surgery(Colon Polyp)     Action/Plan:   from home   Anticipated DC Date:  08/10/2013   Anticipated DC Plan:  [place]  CM consult      Choice offered to / List presented to:             Status of service:  Completed, signed off Medicare Important Message given?  NA - LOS <3 / Initial given by admissions (If response is "NO", the following Medicare IM given date fields will be blank) Date Medicare IM given:   Date Additional Medicare IM given:  08/10/2013  Discharge Disposition:  HOME/SELF CARE  Per UR Regulation:  Reviewed for med. necessity/level of care/duration of stay  If discussed at [place] of Stay Meetings, dates discussed:    Comments:  08/08/13 MMcGibboney, RN, BSN UR completed.

## 2013-08-10 NOTE — Discharge Instructions (Signed)

## 2013-08-10 NOTE — Progress Notes (Signed)
Lab called to give result of potassium- 2.7.  Dr. Zella Richer notified and orders recd.

## 2013-08-10 NOTE — Progress Notes (Signed)
3 Days Post-Op Lap R colectomy Subjective: Doing well.  Passing flatus and having loose BM's.  Tolerating soft diet  Objective: Vital signs in last 24 hours: Temp:  [97.9 F (36.6 C)-99.6 F (37.6 C)] 97.9 F (36.6 C) (05/22 0557) Pulse Rate:  [70-80] 70 (05/22 0557) Resp:  [18] 18 (05/22 0557) BP: (124-136)/(77-84) 124/84 mmHg (05/22 0557) SpO2:  [95 %-96 %] 95 % (05/22 0557)   Intake/Output from previous day: 05/21 0701 - 05/22 0700 In: 720 [P.O.:720] Out: -  Intake/Output this shift:   General appearance: alert and cooperative GI: normal findings: soft, non-tender  Incision: clean, no signs of infection  Lab Results:   Recent Labs  08/09/13 0445 08/10/13 0422  WBC 12.7* 10.7*  HGB 14.0 13.3  HCT 43.8 40.6  PLT 189 210   BMET  Recent Labs  08/09/13 0445 08/10/13 0422  NA 137 136*  K 3.7 2.7*  CL 97 95*  CO2 29 30  GLUCOSE 98 110*  BUN 9 11  CREATININE 0.66 0.70  CALCIUM 9.0 8.9   PT/INR No results found for this basename: LABPROT, INR,  in the last 72 hours ABG No results found for this basename: PHART, PCO2, PO2, HCO3,  in the last 72 hours  MEDS, Scheduled . antiseptic oral rinse  15 mL Mouth Rinse q12n4p  . chlorhexidine  15 mL Mouth Rinse BID  . enoxaparin (LOVENOX) injection  40 mg Subcutaneous Q24H  . escitalopram  10 mg Oral q morning - 10a  . finasteride  5 mg Oral QHS  . hydrochlorothiazide  25 mg Oral q morning - 10a  . polycarbophil  625 mg Oral Daily  . potassium chloride  40 mEq Oral BID  . simvastatin  10 mg Oral q1800  . tamsulosin  0.4 mg Oral QHS  . vitamin C  500 mg Oral BID    Studies/Results: No results found.  Assessment: s/p Procedure(s): LAPAROSCOPIC right PARTIAL COLECTOMY Patient Active Problem List   Diagnosis Date Noted  . Colon polyp 08/07/2013  . 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 Nonspecific abnormal finding in stool contents  05/29/2013  . Atherosclerotic peripheral vascular disease with intermittent claudication 06/14/2012  . Spinal stenosis of lumbar region 06/14/2012  . 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 PHIMOSIS 01/30/2008  . YEAST BALANITIS 10/17/2007  . HYPERLIPIDEMIA 11/23/2006  . OBESITY NOS 11/23/2006  . DEPRESSION 11/23/2006  . HYPERTENSION 11/23/2006  . HEMATURIA 11/23/2006  . BENIGN PROSTATIC HYPERTROPHY 11/23/2006  . PROSTATITIS NOS 11/23/2006  . OSTEOARTHRITIS, HIP, LEFT 11/23/2006    Doing well  Plan: Cont soft diet Hypokalemia: most likely from diarrhea.  Will give PO replacements and recheck this afternoon.   Will start fiber pill to help absorb water from stool Anticipate d/c this afternoon or in AM, depending on resolution of BM's and hypokalemia   LOS: 3 days     .Rosario Adie, [facility], [place]   08/10/2013 8:23 AM

## 2013-08-11 LAB — BASIC METABOLIC PANEL
BUN: 11 mg/dL (ref 6–23)
CALCIUM: 8.9 mg/dL (ref 8.4–10.5)
CO2: 30 mEq/L (ref 19–32)
CREATININE: 0.7 mg/dL (ref 0.50–1.35)
Chloride: 95 mEq/L — ABNORMAL LOW (ref 96–112)
Glucose, Bld: 110 mg/dL — ABNORMAL HIGH (ref 70–99)
Potassium: 2.7 mEq/L — CL (ref 3.7–5.3)
Sodium: 136 mEq/L — ABNORMAL LOW (ref 137–147)

## 2013-08-11 LAB — POTASSIUM: POTASSIUM: 3.3 meq/L — AB (ref 3.7–5.3)

## 2013-08-11 NOTE — Progress Notes (Signed)
General Surgery Note  LOS: 4 days  POD -  4 Days Post-Op PCP - Dr. Wandra Scot (he did see Dr. Linda Hedges until his retirement)  Assessment/Plan: 1.  LAPAROSCOPIC right PARTIAL COLECTOMY - 08/07/2013 - A. Marcello Moores  Patient's wife is in room and patient is ready to go home.  2.  Hypokalemia  Despite oral K+, it went the wrong way  He is on HCTZ.  He is going to eat bananas and drink OJ at home. 3.  DVT prophylaxis - Lovenox 4.  HTN 5.  BPH 6.  History of depression   Active Problems:   Colon polyp  Subjective:  Doing well.  Has taken no pain meds.  Some loose stools.  Wife in room. Objective:   Filed Vitals:   08/11/13 0543  BP: 126/77  Pulse: 83  Temp: 98.3 F (36.8 C)  Resp: 18     Intake/Output from previous day:  05/22 0701 - 05/23 0700 In: 760 [P.O.:760] Out: -   Intake/Output this shift:      Physical Exam:   General: Obese wM who is alert and oriented.    HEENT: Normal. Pupils equal. .   Lungs: Clear.   Abdomen: Soft.  BS present.   Wound: Clean.     Lab Results:    Recent Labs  08/09/13 0445 08/10/13 0422  WBC 12.7* 10.7*  HGB 14.0 13.3  HCT 43.8 40.6  PLT 189 210    BMET   Recent Labs  08/09/13 0445 08/10/13 0422 08/10/13 1414 08/11/13 0420  NA 137 136*  --   --   K 3.7 2.7* 3.6* 3.3*  CL 97 95*  --   --   CO2 29 30  --   --   GLUCOSE 98 110*  --   --   BUN 9 11  --   --   CREATININE 0.66 0.70  --   --   CALCIUM 9.0 8.9  --   --     PT/INR  No results found for this basename: LABPROT, INR,  in the last 72 hours  ABG  No results found for this basename: PHART, PCO2, PO2, HCO3,  in the last 72 hours   Studies/Results:  No results found.   Anti-infectives:   Anti-infectives   Start     Dose/Rate Route Frequency Ordered Stop   08/07/13 0900  clindamycin (CLEOCIN) IVPB 600 mg  Status:  Discontinued     600 mg 100 mL/hr over 30 Minutes Intravenous  Once 08/07/13 0810 08/07/13 0814   08/07/13 0830  clindamycin (CLEOCIN) IVPB 900 mg     900 mg 100 mL/hr over 30 Minutes Intravenous On call 08/07/13 0815 08/07/13 1006   08/07/13 0830  gentamicin (GARAMYCIN) 480 mg in dextrose 5 % 100 mL IVPB     480 mg 224 mL/hr over 30 Minutes Intravenous On call 08/07/13 0821 08/07/13 0950   08/07/13 0728  cefoTEtan (CEFOTAN) 2 g in dextrose 5 % 50 mL IVPB  Status:  Discontinued     2 g 100 mL/hr over 30 Minutes Intravenous On call to O.R. 08/07/13 0728 08/07/13 4270      Alphonsa Overall, MD, FACS Pager: [facility] Office: [phone] 08/11/2013

## 2013-08-13 NOTE — Progress Notes (Signed)
Discharge summary sent to payer through [employer]  

## 2013-08-14 NOTE — Discharge Summary (Signed)
Physician Discharge Summary  Patient ID: Gabriel Love MRN: [id number] DOB/AGE: [birth year] 73 y.o.  Admit date: 08/07/2013 Discharge date: 08/14/2013  Admission Diagnoses: colon polyp  Discharge Diagnoses:  Active Problems:   Colon polyp   Discharged Condition: good  Hospital Course: Patient was admitted to the floor after laparoscopic right hemicolectomy.  He did well and began to have bowel function by POD 2.  His diet was advanced appropriately.  His foley was removed on POD 2.  He did have some diarrhea post operatively that appeared to be causing hypokalemia.  His potassium was replaced and he was given a bulking agent for his stools.  This appeared to resolve.  He was sent home with instructions to continue a high potassium, low residue diet.  He was discharged to home in stable condition.    Consults: None  Significant Diagnostic Studies: labs: cbc, chemistry  Treatments: IV hydration, analgesia: acetaminophen w/ codeine and surgery: see above  Discharge Exam: Blood pressure 126/77, pulse 83, temperature 98.3 F (36.8 C), temperature source Oral, resp. rate 18, height 5\' 11"  (1.803 m), weight 333 lb (151.048 kg), SpO2 93.00%. General appearance: alert and cooperative GI: normal findings: soft, non-tender Incision/Wound: clean, dry  Disposition: 01-Home or Self Care  Discharge Instructions   Diet - low sodium heart healthy    Complete by:  As directed      Diet - low sodium heart healthy    Complete by:  As directed      Increase activity slowly    Complete by:  As directed      Increase activity slowly    Complete by:  As directed             Medication List         aspirin 325 MG EC tablet  Take 325 mg by mouth daily.     escitalopram 10 MG tablet  Commonly known as:  LEXAPRO  Take 10 mg by mouth every morning.     finasteride 5 MG tablet  Commonly known as:  PROSCAR  Take 5 mg by mouth at bedtime.     hydrochlorothiazide 25 MG tablet  Commonly known  as:  HYDRODIURIL  Take 25 mg by mouth every morning.     lovastatin 20 MG tablet  Commonly known as:  MEVACOR  Take 20 mg by mouth at bedtime.     tamsulosin 0.4 MG Caps capsule  Commonly known as:  FLOMAX  Take 0.4 mg by mouth at bedtime.     vitamin C 500 MG tablet  Commonly known as:  ASCORBIC ACID  Take 500 mg by mouth 2 (two) times daily.           Follow-up Information   Follow up with Rosario Adie., MD In 2 weeks.   Specialty:  General Surgery   Contact information:   [address] [place] [address] [phone]       Signed: Leighton Ruff 1/66/0630, 7:35 AM

## 2013-08-23 ENCOUNTER — Ambulatory Visit: Payer: Medicare Other | Admitting: Oncology

## 2013-08-23 ENCOUNTER — Ambulatory Visit: Payer: Medicare Other

## 2013-08-24 ENCOUNTER — Encounter (INDEPENDENT_AMBULATORY_CARE_PROVIDER_SITE_OTHER): Payer: Self-pay | Admitting: General Surgery

## 2013-08-24 ENCOUNTER — Ambulatory Visit (INDEPENDENT_AMBULATORY_CARE_PROVIDER_SITE_OTHER): Payer: Medicare Other | Admitting: General Surgery

## 2013-08-24 VITALS — BP 140/80 | HR 72 | Temp 97.5°F | Ht 71.0 in | Wt 319.0 lb

## 2013-08-24 DIAGNOSIS — D126 Benign neoplasm of colon, unspecified: Secondary | ICD-10-CM

## 2013-08-24 DIAGNOSIS — K635 Polyp of colon: Secondary | ICD-10-CM

## 2013-08-24 NOTE — Patient Instructions (Signed)
Return to the office as needed 

## 2013-08-24 NOTE — Progress Notes (Signed)
Gabriel Love is a 73 y.o. male who is status post a lap R colectomy on 08/09/13 for ileocecal valve polyp.  He is doing well.  We had some trouble with diarrhea and hypokalemia in the hospital.  The diarrhea appears to have resolved.  He has been eating a high potassium diet.  His pain is resolved.  His incision drained a little clear fluid but has since stopped.  He is back to light activity and having some return of his chronic back pain.    Objective: Filed Vitals:   08/24/13 1139  BP: 140/80  Pulse: 72  Temp: 97.5 F (36.4 C)    General appearance: alert and cooperative GI: soft, non tender, obese  Incision: healing well   Assessment: s/p  Patient Active Problem List   Diagnosis Date Noted  . Colon polyp 08/07/2013  . 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 Nonspecific abnormal finding in stool contents 05/29/2013  . Atherosclerotic peripheral vascular disease with intermittent claudication 06/14/2012  . Spinal stenosis of lumbar region 06/14/2012  . 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 PHIMOSIS 01/30/2008  . YEAST BALANITIS 10/17/2007  . HYPERLIPIDEMIA 11/23/2006  . OBESITY NOS 11/23/2006  . DEPRESSION 11/23/2006  . HYPERTENSION 11/23/2006  . HEMATURIA 11/23/2006  . BENIGN PROSTATIC HYPERTROPHY 11/23/2006  . PROSTATITIS NOS 11/23/2006  . 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 - TWO TUBULAR ADENOMAS WITH NO EVIDENCE OF HIGH GRADE DYSPLASIA OR MALIGNANCY. - POLYPOID COLONIC MUCOSA WITH INTRAMUCOSAL LYMPHOID AGGREGATES, NO DYSPLASIA OR MALIGNANCY. - MULTIPLE POLYPOID SMALL BOWEL MUCOSA WITH INTRAMUCOSAL LYMPHOID AGGREGATES, NO DYSPLASIA OR MALIGNANCY. - TWELVE LYMPH NODES, NEGATIVE FOR NEOPLASM. (0/12) - RESECTION MARGINS,  NEGATIVE FOR ATYPIA OR MALIGNANCY.  Plan: Gabriel Love is doing well after surgery.  His pathology showed a tubular adenoma with HGD but no cancer.  No heavy lifting for 8 weeks after surgery.  Ok to drive.  Ok to start high fiber diet.  Will follow up with Dr Deatra Ina and repeat colonoscopy at his discretion.  F/U with me PRN.    Marland KitchenRosario Adie, MD [facility], [place]   08/24/2013 12:05 PM

## 2013-09-10 ENCOUNTER — Telehealth: Payer: Self-pay | Admitting: Family Medicine

## 2013-09-10 NOTE — Telephone Encounter (Signed)
[id number] [address], [place] is requesting re-fill on escitalopram (LEXAPRO) 10 MG tablet

## 2013-09-11 ENCOUNTER — Telehealth: Payer: Self-pay | Admitting: Family Medicine

## 2013-09-11 MED ORDER — ESCITALOPRAM OXALATE 10 MG PO TABS: 10.0000 mg | ORAL_TABLET | Freq: Every morning | ORAL | Status: DC

## 2013-09-11 NOTE — Telephone Encounter (Signed)
Pt requested brand name only for the Lexapro. I did resend script with this information.

## 2013-09-11 NOTE — Addendum Note (Signed)
Addended by: Aggie Hacker A on: 09/11/2013 09:48 AM   Modules accepted: Orders

## 2013-09-11 NOTE — Telephone Encounter (Signed)
Pt is a transfer from Dr. Linda Hedges and this is something that he was prescribing. Pt has seen Dr. Sarajane Jews once, however we did not do any refills at that time and now pt will run out before Dr. Sarajane Jews returns to the office next week. Can we send in a 1 month supply ?

## 2013-09-11 NOTE — Telephone Encounter (Signed)
I resent script e-scribe to [facility] and left a voice message for pt.

## 2013-09-11 NOTE — Telephone Encounter (Signed)
done

## 2013-09-11 NOTE — Addendum Note (Signed)
Addended byRoxy Cedar B on: 09/11/2013 09:39 AM   Modules accepted: Orders

## 2013-09-13 ENCOUNTER — Telehealth: Payer: Self-pay | Admitting: Family Medicine

## 2013-09-13 MED ORDER — ESCITALOPRAM OXALATE 10 MG PO TABS: 10.0000 mg | ORAL_TABLET | Freq: Every morning | ORAL | Status: DC

## 2013-09-13 MED ORDER — LEXAPRO 10 MG PO TABS: 10.0000 mg | ORAL_TABLET | Freq: Every day | ORAL | Status: DC

## 2013-09-13 NOTE — Telephone Encounter (Signed)
Script was called in today, again.

## 2013-09-13 NOTE — Telephone Encounter (Signed)
Pt states he can NOT take the generic escitalopram (LEXAPRO) 10 MG tablet Pt needs name brand LEXAPRO sent to [facility].   Pt has tried to take the generic, but does not work.  The pharmacy will switch out for pt when we send the correct med. Pt will be out today of med and needs sent in today please!

## 2013-09-24 ENCOUNTER — Telehealth: Payer: Self-pay | Admitting: Family Medicine

## 2013-09-24 ENCOUNTER — Other Ambulatory Visit: Payer: Self-pay | Admitting: Family Medicine

## 2013-09-24 NOTE — Telephone Encounter (Signed)
Pt req rx on hydrochlorothiazide (HYDRODIURIL) 25 MG tablet

## 2013-09-25 MED ORDER — HYDROCHLOROTHIAZIDE 25 MG PO TABS: ORAL_TABLET | ORAL | Status: DC

## 2013-09-25 NOTE — Telephone Encounter (Signed)
rx sent in electronically 

## 2013-09-27 ENCOUNTER — Encounter: Payer: Self-pay | Admitting: *Deleted

## 2014-01-02 ENCOUNTER — Encounter: Payer: Self-pay | Admitting: Family Medicine

## 2014-01-02 ENCOUNTER — Ambulatory Visit (INDEPENDENT_AMBULATORY_CARE_PROVIDER_SITE_OTHER): Payer: Medicare Other | Admitting: Family Medicine

## 2014-01-02 VITALS — BP 134/78 | Temp 98.4°F | Ht 71.0 in | Wt 335.0 lb

## 2014-01-02 DIAGNOSIS — I10 Essential (primary) hypertension: Secondary | ICD-10-CM

## 2014-01-02 DIAGNOSIS — Z23 Encounter for immunization: Secondary | ICD-10-CM

## 2014-01-02 DIAGNOSIS — F32A Depression, unspecified: Secondary | ICD-10-CM

## 2014-01-02 DIAGNOSIS — E785 Hyperlipidemia, unspecified: Secondary | ICD-10-CM

## 2014-01-02 DIAGNOSIS — M5137 Other intervertebral disc degeneration, lumbosacral region: Secondary | ICD-10-CM

## 2014-01-02 DIAGNOSIS — F329 Major depressive disorder, single episode, unspecified: Secondary | ICD-10-CM

## 2014-01-02 MED ORDER — LOVASTATIN 20 MG PO TABS: 20.0000 mg | ORAL_TABLET | Freq: Every day | ORAL | Status: DC

## 2014-01-02 MED ORDER — LEXAPRO 10 MG PO TABS: 10.0000 mg | ORAL_TABLET | Freq: Every day | ORAL | Status: DC

## 2014-01-02 NOTE — Addendum Note (Signed)
Addended by: Aggie Hacker A on: 01/02/2014 12:45 PM   Modules accepted: Orders

## 2014-01-02 NOTE — Progress Notes (Signed)
Pre visit review using our clinic review tool, if applicable. No additional management support is needed unless otherwise documented below in the visit note. 

## 2014-01-02 NOTE — Progress Notes (Signed)
   Subjective:    Patient ID: Gabriel Love, male    DOB: Apr 25, 1940, 73 y.o.   MRN: [id number]  HPI 73 yr old male for a cpx. He feels well other than some arthritis pain in the spine and knees. He had a benign neooplasm removed from his colon this past summer, and this went well.    Review of Systems  Constitutional: Negative.   HENT: Negative.   Eyes: Negative.   Respiratory: Negative.   Cardiovascular: Negative.   Gastrointestinal: Negative.   Genitourinary: Negative.   Musculoskeletal: Positive for arthralgias, back pain and gait problem. Negative for joint swelling, myalgias and neck pain.  Skin: Negative.   Neurological: Negative.   Psychiatric/Behavioral: Negative.        Objective:   Physical Exam  Constitutional: He is oriented to person, place, and time. He appears well-developed and well-nourished. No distress.  HENT:  Head: Normocephalic and atraumatic.  Right Ear: External ear normal.  Left Ear: External ear normal.  Nose: Nose normal.  Mouth/Throat: Oropharynx is clear and moist. No oropharyngeal exudate.  Eyes: Conjunctivae and EOM are normal. Pupils are equal, round, and reactive to light. Right eye exhibits no discharge. Left eye exhibits no discharge. No scleral icterus.  Neck: Neck supple. No JVD present. No tracheal deviation present. No thyromegaly present.  Cardiovascular: Normal rate, regular rhythm, normal heart sounds and intact distal pulses.  Exam reveals no gallop and no friction rub.   No murmur heard. Pulmonary/Chest: Effort normal and breath sounds normal. No respiratory distress. He has no wheezes. He has no rales. He exhibits no tenderness.  Abdominal: Soft. Bowel sounds are normal. He exhibits no distension and no mass. There is no tenderness. There is no rebound and no guarding.  Musculoskeletal: Normal range of motion. He exhibits no edema and no tenderness.  Lymphadenopathy:    He has no cervical adenopathy.  Neurological: He is alert and  oriented to person, place, and time. He has normal reflexes. No cranial nerve deficit. He exhibits normal muscle tone. Coordination normal.  Skin: Skin is warm and dry. No rash noted. He is not diaphoretic. No erythema. No pallor.  Psychiatric: He has a normal mood and affect. His behavior is normal. Judgment and thought content normal.          Assessment & Plan:  Well exam. Refilled meds. Set up fasting labs soon.

## 2014-01-11 ENCOUNTER — Other Ambulatory Visit: Payer: Medicare Other

## 2014-01-11 ENCOUNTER — Other Ambulatory Visit (INDEPENDENT_AMBULATORY_CARE_PROVIDER_SITE_OTHER): Payer: Medicare Other

## 2014-01-11 DIAGNOSIS — E785 Hyperlipidemia, unspecified: Secondary | ICD-10-CM

## 2014-01-11 DIAGNOSIS — I10 Essential (primary) hypertension: Secondary | ICD-10-CM

## 2014-01-11 LAB — BASIC METABOLIC PANEL
BUN: 22 mg/dL (ref 6–23)
CALCIUM: 8.8 mg/dL (ref 8.4–10.5)
CO2: 28 mEq/L (ref 19–32)
Chloride: 102 mEq/L (ref 96–112)
Creatinine, Ser: 0.8 mg/dL (ref 0.4–1.5)
GFR: 103.28 mL/min (ref >60.00)
Glucose, Bld: 85 mg/dL (ref 70–99)
Potassium: 3.5 mEq/L (ref 3.5–5.1)
SODIUM: 139 meq/L (ref 135–145)

## 2014-01-11 LAB — CBC WITH DIFFERENTIAL/PLATELET
BASOS ABS: 0 10*3/uL (ref 0.0–0.1)
Basophils Relative: 0.4 % (ref 0.0–3.0)
Eosinophils Absolute: 0.1 10*3/uL (ref 0.0–0.7)
Eosinophils Relative: 1.5 % (ref 0.0–5.0)
HCT: 44.5 % (ref 39.0–52.0)
Hemoglobin: 14.1 g/dL (ref 13.0–17.0)
LYMPHS PCT: 27.6 % (ref 12.0–46.0)
Lymphs Abs: 2.5 10*3/uL (ref 0.7–4.0)
MCHC: 31.7 g/dL (ref 30.0–36.0)
MCV: 82.5 fl (ref 78.0–100.0)
MONO ABS: 0.9 10*3/uL (ref 0.1–1.0)
Monocytes Relative: 9.9 % (ref 3.0–12.0)
NEUTROS ABS: 5.6 10*3/uL (ref 1.4–7.7)
NEUTROS PCT: 60.6 % (ref 43.0–77.0)
Platelets: 244 10*3/uL (ref 150.0–400.0)
RBC: 5.4 Mil/uL (ref 4.22–5.81)
RDW: 16.2 % — AB (ref 11.5–15.5)
WBC: 9.2 10*3/uL (ref 4.0–10.5)

## 2014-01-11 LAB — HEPATIC FUNCTION PANEL
ALT: 19 U/L (ref 0–53)
AST: 26 U/L (ref 0–37)
Albumin: 3.4 g/dL — ABNORMAL LOW (ref 3.5–5.2)
Alkaline Phosphatase: 59 U/L (ref 39–117)
BILIRUBIN DIRECT: 0 mg/dL (ref 0.0–0.3)
Total Bilirubin: 0.8 mg/dL (ref 0.2–1.2)
Total Protein: 7.5 g/dL (ref 6.0–8.3)

## 2014-01-11 LAB — TSH: TSH: 1.84 u[IU]/mL (ref 0.35–4.50)

## 2014-01-11 LAB — LIPID PANEL
CHOLESTEROL: 222 mg/dL — AB (ref 0–200)
HDL: 34.5 mg/dL — AB (ref >39.00)
NonHDL: 187.5
TRIGLYCERIDES: 226 mg/dL — AB (ref 0.0–149.0)
Total CHOL/HDL Ratio: 6
VLDL: 45.2 mg/dL — ABNORMAL HIGH (ref 0.0–40.0)

## 2014-01-11 LAB — LDL CHOLESTEROL, DIRECT: LDL DIRECT: 139.7 mg/dL

## 2014-01-15 ENCOUNTER — Telehealth: Payer: Self-pay | Admitting: Family Medicine

## 2014-01-15 MED ORDER — ATORVASTATIN CALCIUM 40 MG PO TABS: 40.0000 mg | ORAL_TABLET | Freq: Every day | ORAL | Status: DC

## 2014-01-15 NOTE — Telephone Encounter (Signed)
I spoke with pt's wife, pt is home, however he cannot hear well. I went over lab results.

## 2014-01-15 NOTE — Telephone Encounter (Signed)
Pt would like a call back about lab results that was done on Friday 01/11/14

## 2014-01-15 NOTE — Addendum Note (Signed)
Addended by: Aggie Hacker A on: 01/15/2014 04:20 PM   Modules accepted: Orders, Medications

## 2014-03-18 NOTE — Patient Instructions (Addendum)
Your procedure is scheduled on:  03/25/14  Report to [facility] at 06:15 AM.  Call this number if you have problems the morning of surgery: [phone]   Remember:   Do not eat food or drink liquids after midnight.   Take these medicines the morning of surgery with A SIP OF WATER: LEXAPRO   Do not wear jewelry, make-up or nail polish.  Do not wear lotions, powders, or perfumes. You may wear deodorant.  Do not bring valuables to the hospital.  [facility] is not responsible for any belongings or valuables.               Contacts, dentures or bridgework may not be worn into surgery.               Patients discharged the day of surgery will not be allowed to drive home.   Special Instructions: Start using your eye drops prior to surgery as directed by your eye doctor.   Please read over the following fact sheets that you were given: Anesthesia Post-op Instructions and Care and Recovery After Surgery     Cataract Surgery  A cataract is a clouding of the lens of the eye. When a lens becomes cloudy, vision is reduced based on the degree and nature of the clouding. Surgery may be needed to improve vision. Surgery removes the cloudy lens and usually replaces it with a substitute lens (intraocular lens, IOL). LET YOUR EYE DOCTOR KNOW ABOUT:  Allergies to food or medicine.  Medicines taken including herbs, eyedrops, over-the-counter medicines, and creams.  Use of steroids (by mouth or creams).  Previous problems with anesthetics or numbing medicine.  History of bleeding problems or blood clots.  Previous surgery.  Other health problems, including diabetes and kidney problems.  Possibility of pregnancy, if this applies. RISKS AND COMPLICATIONS  Infection.  Inflammation of the eyeball (endophthalmitis) that can spread to both eyes (sympathetic ophthalmia).  Poor wound healing.  If an IOL is inserted, it can later fall out of proper position. This is very uncommon.  Clouding of  the part of your eye that holds an IOL in place. This is called an "after-cataract." These are uncommon, but easily treated. BEFORE THE PROCEDURE  Do not eat or drink anything except small amounts of water for 8 to 12 before your surgery, or as directed by your caregiver.  Unless you are told otherwise, continue any eyedrops you have been prescribed.  Talk to your primary caregiver about all other medicines that you take (both prescription and non-prescription). In some cases, you may need to stop or change medicines near the time of your surgery. This is most important if you are taking blood-thinning medicine.Do not stop medicines unless you are told to do so.  Arrange for someone to drive you to and from the procedure.  Do not put contact lenses in either eye on the day of your surgery. PROCEDURE There is more than one method for safely removing a cataract. Your doctor can explain the differences and help determine which is best for you. Phacoemulsification surgery is the most common form of cataract surgery.  An injection is given behind the eye or eyedrops are given to make this a painless procedure.  A small cut (incision) is made on the edge of the clear, dome-shaped surface that covers the front of the eye (cornea).  A tiny probe is painlessly inserted into the eye. This device gives off ultrasound waves that soften and break up the  cloudy center of the lens. This makes it easier for the cloudy lens to be removed by suction.  An IOL may be implanted.  The normal lens of the eye is covered by a clear capsule. Part of that capsule is intentionally left in the eye to support the IOL.  Your surgeon may or may not use stitches to close the incision. There are other forms of cataract surgery that require a larger incision and stiches to close the eye. This approach is taken in cases where the doctor feels that the cataract cannot be easily removed using phacoemulsification. AFTER THE  PROCEDURE  When an IOL is implanted, it does not need care. It becomes a permanent part of your eye and cannot be seen or felt.  Your doctor will schedule follow-up exams to check on your progress.  Review your other medicines with your doctor to see which can be resumed after surgery.  Use eyedrops or take medicine as prescribed by your doctor. Document Released: 02/25/2011 Document Revised: 05/31/2011 Document Reviewed: 02/25/2011 [facility] Patient Information 2013 [place].    PATIENT INSTRUCTIONS POST-ANESTHESIA  IMMEDIATELY FOLLOWING SURGERY:  Do not drive or operate machinery for the first twenty four hours after surgery.  Do not make any important decisions for twenty four hours after surgery or while taking narcotic pain medications or sedatives.  If you develop intractable nausea and vomiting or a severe headache please notify your doctor immediately.  FOLLOW-UP:  Please make an appointment with your surgeon as instructed. You do not need to follow up with anesthesia unless specifically instructed to do so.  WOUND CARE INSTRUCTIONS (if applicable):  Keep a dry clean dressing on the anesthesia/puncture wound site if there is drainage.  Once the wound has quit draining you may leave it open to air.  Generally you should leave the bandage intact for twenty four hours unless there is drainage.  If the epidural site drains for more than 36-48 hours please call the anesthesia department.  QUESTIONS?:  Please feel free to call your physician or the hospital operator if you have any questions, and they will be happy to assist you.

## 2014-03-19 ENCOUNTER — Encounter (HOSPITAL_COMMUNITY)
Admission: RE | Admit: 2014-03-19 | Discharge: 2014-03-19 | Disposition: A | Discharge status: Home / Self Care | Payer: Medicare Other | Source: Ambulatory Visit | Attending: Ophthalmology | Admitting: Ophthalmology

## 2014-03-19 ENCOUNTER — Encounter (HOSPITAL_COMMUNITY): Payer: Self-pay

## 2014-03-25 ENCOUNTER — Ambulatory Visit (HOSPITAL_COMMUNITY): Payer: Medicare Other | Admitting: Anesthesiology

## 2014-03-25 ENCOUNTER — Encounter (HOSPITAL_COMMUNITY)
Admission: RE | Disposition: A | Discharge status: Home / Self Care | Payer: Self-pay | Source: Ambulatory Visit | Attending: Ophthalmology

## 2014-03-25 ENCOUNTER — Encounter (HOSPITAL_COMMUNITY): Payer: Self-pay | Admitting: *Deleted

## 2014-03-25 ENCOUNTER — Ambulatory Visit (HOSPITAL_COMMUNITY)
Admission: RE | Admit: 2014-03-25 | Discharge: 2014-03-25 | Disposition: A | Discharge status: Home / Self Care | Payer: Medicare Other | Source: Ambulatory Visit | Attending: Ophthalmology | Admitting: Ophthalmology

## 2014-03-25 DIAGNOSIS — H269 Unspecified cataract: Secondary | ICD-10-CM | POA: Diagnosis present

## 2014-03-25 DIAGNOSIS — M1990 Unspecified osteoarthritis, unspecified site: Secondary | ICD-10-CM | POA: Diagnosis not present

## 2014-03-25 DIAGNOSIS — Z87891 Personal history of nicotine dependence: Secondary | ICD-10-CM | POA: Insufficient documentation

## 2014-03-25 DIAGNOSIS — I10 Essential (primary) hypertension: Secondary | ICD-10-CM | POA: Diagnosis not present

## 2014-03-25 DIAGNOSIS — Z6841 Body Mass Index (BMI) 40.0 and over, adult: Secondary | ICD-10-CM | POA: Diagnosis not present

## 2014-03-25 DIAGNOSIS — F329 Major depressive disorder, single episode, unspecified: Secondary | ICD-10-CM | POA: Insufficient documentation

## 2014-03-25 DIAGNOSIS — I739 Peripheral vascular disease, unspecified: Secondary | ICD-10-CM | POA: Diagnosis not present

## 2014-03-25 HISTORY — PX: CATARACT EXTRACTION W/PHACO: SHX586

## 2014-03-25 SURGERY — PHACOEMULSIFICATION, CATARACT, WITH IOL INSERTION
Anesthesia: Monitor Anesthesia Care | Site: Eye | Laterality: Right

## 2014-03-25 MED ORDER — TETRACAINE 0.5 % OP SOLN OPTIME - NO CHARGE
OPHTHALMIC | Status: DC | PRN
  Administered 2014-03-25: 1 [drp] via OPHTHALMIC

## 2014-03-25 MED ORDER — TETRACAINE HCL 0.5 % OP SOLN
1.0000 [drp] | OPHTHALMIC | Status: AC
  Administered 2014-03-25 (×3): 1 [drp] via OPHTHALMIC

## 2014-03-25 MED ORDER — CYCLOPENTOLATE-PHENYLEPHRINE 0.2-1 % OP SOLN
1.0000 [drp] | OPHTHALMIC | Status: AC
  Administered 2014-03-25 (×3): 1 [drp] via OPHTHALMIC

## 2014-03-25 MED ORDER — LIDOCAINE HCL 3.5 % OP GEL: 1.0000 "application " | Freq: Once | OPHTHALMIC | Status: DC

## 2014-03-25 MED ORDER — NA HYALUR & NA CHOND-NA HYALUR 0.55-0.5 ML IO KIT
PACK | INTRAOCULAR | Status: DC | PRN
  Administered 2014-03-25: 1 via OPHTHALMIC

## 2014-03-25 MED ORDER — BSS IO SOLN
INTRAOCULAR | Status: DC | PRN
  Administered 2014-03-25: 500 mL

## 2014-03-25 MED ORDER — TETRACAINE HCL 0.5 % OP SOLN
OPHTHALMIC | Status: AC
  Filled 2014-03-25: qty 2

## 2014-03-25 MED ORDER — MIDAZOLAM HCL 2 MG/2ML IJ SOLN
INTRAMUSCULAR | Status: AC
  Filled 2014-03-25: qty 2

## 2014-03-25 MED ORDER — FENTANYL CITRATE 0.05 MG/ML IJ SOLN
25.0000 ug | INTRAMUSCULAR | Status: AC
  Administered 2014-03-25 (×2): 25 ug via INTRAVENOUS

## 2014-03-25 MED ORDER — LACTATED RINGERS IV SOLN
INTRAVENOUS | Status: DC
  Administered 2014-03-25: 07:00:00 via INTRAVENOUS

## 2014-03-25 MED ORDER — EPINEPHRINE HCL 1 MG/ML IJ SOLN
INTRAMUSCULAR | Status: AC
  Filled 2014-03-25: qty 1

## 2014-03-25 MED ORDER — MIDAZOLAM HCL 2 MG/2ML IJ SOLN
1.0000 mg | INTRAMUSCULAR | Status: DC | PRN
Start: 2014-03-25 — End: 2014-03-25
  Administered 2014-03-25 (×2): 2 mg via INTRAVENOUS
  Filled 2014-03-25: qty 2

## 2014-03-25 MED ORDER — CYCLOPENTOLATE-PHENYLEPHRINE OP SOLN OPTIME - NO CHARGE
OPHTHALMIC | Status: AC
  Filled 2014-03-25: qty 2

## 2014-03-25 MED ORDER — LIDOCAINE HCL 3.5 % OP GEL
OPHTHALMIC | Status: DC | PRN
  Administered 2014-03-25: 1 via OPHTHALMIC

## 2014-03-25 MED ORDER — FENTANYL CITRATE 0.05 MG/ML IJ SOLN
INTRAMUSCULAR | Status: AC
  Filled 2014-03-25: qty 2

## 2014-03-25 MED ORDER — LIDOCAINE HCL 3.5 % OP GEL
OPHTHALMIC | Status: AC
  Filled 2014-03-25: qty 1

## 2014-03-25 MED ORDER — BSS IO SOLN
INTRAOCULAR | Status: DC | PRN
  Administered 2014-03-25: 15 mL via INTRAOCULAR

## 2014-03-25 SURGICAL SUPPLY — 28 items
CAPSULAR TENSION RING-AMO (OPHTHALMIC RELATED) IMPLANT
CLOTH BEACON ORANGE TIMEOUT ST (SAFETY) ×2 IMPLANT
GLOVE BIO SURGEON STRL SZ7.5 (GLOVE) IMPLANT
GLOVE BIOGEL M 6.5 STRL (GLOVE) IMPLANT
GLOVE BIOGEL PI IND STRL 6.5 (GLOVE) IMPLANT
GLOVE BIOGEL PI IND STRL 7.0 (GLOVE) IMPLANT
GLOVE BIOGEL PI INDICATOR 6.5 (GLOVE) ×2
GLOVE BIOGEL PI INDICATOR 7.0 (GLOVE)
GLOVE ECLIPSE 6.5 STRL STRAW (GLOVE) IMPLANT
GLOVE ECLIPSE 7.5 STRL STRAW (GLOVE) IMPLANT
GLOVE EXAM NITRILE LRG STRL (GLOVE) IMPLANT
GLOVE EXAM NITRILE MD LF STRL (GLOVE) ×2 IMPLANT
GLOVE SKINSENSE NS SZ6.5 (GLOVE)
GLOVE SKINSENSE NS SZ7.0 (GLOVE)
GLOVE SKINSENSE STRL SZ6.5 (GLOVE) IMPLANT
GLOVE SKINSENSE STRL SZ7.0 (GLOVE) IMPLANT
INST SET CATARACT ~~LOC~~ (KITS) ×3 IMPLANT
KIT VITRECTOMY (OPHTHALMIC RELATED) IMPLANT
PAD ARMBOARD 7.5X6 YLW CONV (MISCELLANEOUS) ×2 IMPLANT
PROC W NO LENS (INTRAOCULAR LENS)
PROC W SPEC LENS (INTRAOCULAR LENS)
PROCESS W NO LENS (INTRAOCULAR LENS) IMPLANT
PROCESS W SPEC LENS (INTRAOCULAR LENS) IMPLANT
RETRACTOR IRIS SIGHTPATH (OPHTHALMIC RELATED) IMPLANT
RING MALYGIN (MISCELLANEOUS) IMPLANT
SIGHTPATH CAT PROC W REG LENS (Ophthalmic Related) ×3 IMPLANT
VISCOELASTIC ADDITIONAL (OPHTHALMIC RELATED) IMPLANT
WATER STERILE IRR 250ML POUR (IV SOLUTION) ×2 IMPLANT

## 2014-03-25 NOTE — Op Note (Signed)
See scanned note.

## 2014-03-25 NOTE — Anesthesia Preprocedure Evaluation (Signed)
Anesthesia Evaluation  Patient identified by MRN, date of birth, ID band Patient awake    Reviewed: Allergy & Precautions, H&P , NPO status , Patient's Chart, lab work & pertinent test results  Airway Mallampati: III  TM Distance: >3 FB Neck ROM: Full    Dental  (+) Edentulous Upper, Edentulous Lower   Pulmonary neg pulmonary ROS, former smoker,  breath sounds clear to auscultation  Pulmonary exam normal       Cardiovascular hypertension, Pt. on medications + Peripheral Vascular Disease Rhythm:Regular Rate:Normal     Neuro/Psych PSYCHIATRIC DISORDERS Depression negative neurological ROS  negative psych ROS   GI/Hepatic negative GI ROS, Neg liver ROS,   Endo/Other  Morbid obesity  Renal/GU negative Renal ROS  negative genitourinary   Musculoskeletal negative musculoskeletal ROS (+) Arthritis -,   Abdominal   Peds  Hematology negative hematology ROS (+)   Anesthesia Other Findings   Reproductive/Obstetrics                             Anesthesia Physical Anesthesia Plan  ASA: III  Anesthesia Plan: MAC   Post-op Pain Management:    Induction: Intravenous  Airway Management Planned: Nasal Cannula  Additional Equipment:   Intra-op Plan:   Post-operative Plan:   Informed Consent: I have reviewed the patients History and Physical, chart, labs and discussed the procedure including the risks, benefits and alternatives for the proposed anesthesia with the patient or authorized representative who has indicated his/her understanding and acceptance.     Plan Discussed with:   Anesthesia Plan Comments:         Anesthesia Quick Evaluation

## 2014-03-25 NOTE — Transfer of Care (Signed)
Immediate Anesthesia Transfer of Care Note  Patient: Gabriel Love  Procedure(s) Performed: Procedure(s): CATARACT EXTRACTION PHACO AND INTRAOCULAR LENS PLACEMENT; CDE:9.76 (Right)  Patient Location: Short Stay  Anesthesia Type:MAC  Level of Consciousness: awake  Airway & Oxygen Therapy: Patient Spontanous Breathing  Post-op Assessment: Report given to PACU RN  Post vital signs: Reviewed  Complications: No apparent anesthesia complications

## 2014-03-25 NOTE — Anesthesia Postprocedure Evaluation (Signed)
  Anesthesia Post-op Note  Patient: Gabriel Love  Procedure(s) Performed: Procedure(s): CATARACT EXTRACTION PHACO AND INTRAOCULAR LENS PLACEMENT; CDE:9.76 (Right)  Patient Location: Short Stay  Anesthesia Type:MAC  Level of Consciousness: awake, alert  and oriented  Airway and Oxygen Therapy: Patient Spontanous Breathing  Post-op Pain: none  Post-op Assessment: Post-op Vital signs reviewed, Patient's Cardiovascular Status Stable, Respiratory Function Stable, Patent Airway and No signs of Nausea or vomiting  Post-op Vital Signs: Reviewed and stable  Last Vitals:  Filed Vitals:   03/25/14 0735  BP: 159/88  Pulse:   Temp:   Resp: 14    Complications: No apparent anesthesia complications

## 2014-03-25 NOTE — Brief Op Note (Signed)
03/25/2014  8:23 AM  PATIENT:  Gabriel Love  74 y.o. male  PRE-OPERATIVE DIAGNOSIS:  nuclear cataract right eye  POST-OPERATIVE DIAGNOSIS:  nuclear cataract right eye  PROCEDURE:  Procedure(s): CATARACT EXTRACTION PHACO AND INTRAOCULAR LENS PLACEMENT; CDE:9.76  SURGEON:  Surgeon(s): Williams Che, MD  ASSISTANTS:  Bonney Roussel, CST   ANESTHESIA STAFF: Anesthesiologist: Lerry Liner, MD CRNA: Ollen Bowl, CRNA  ANESTHESIA:   topical and MAC  REQUESTED LENS POWER: 22.5  LENS IMPLANT INFORMATION: @ORIMPLANT @  CUMULATIVE DISSIPATED ENERGY:9.76  INDICATIONS:see scanned office H&P  OP FINDINGS:moderately dense NS and cortical  COMPLICATIONS:None  DICTATION #: none   PLAN OF CARE: as above  PATIENT DISPOSITION:  Short Stay

## 2014-03-25 NOTE — Discharge Instructions (Signed)
Gabriel Love 03/25/2014 Dr. Iona Hansen Post operative Instructions for Cataract Patients  These instructions are for [name] and pertain to the operative eye.  1.  Resume your normal diet and previous oral medicines.  2. Your Follow-up appointment is at Dr. Iona Hansen' office in [place] on 03/26/14 at 1:45pm  3. You may leave the hospital when your driver is present and your nurse releases you.  4. Begin Pred Forte (prednisolone acetate 1%), Acular LS (ketorolac tromethamine .4%) and Gatifloxacin 0.5% eye drops; 1 drop each 4 times daily to operative eye. Begin 3 hours after discharge from Short Stay Unit.  Moxifloxacin 0.5% may be substituted for Gatifloxacin using the same instructions.  64. Page Dr. Iona Hansen via beeper [phone] for significant pain in or around operative eye that is not relieved by Tylenol.  6. If you took Plavix before surgery, restart it at the usual dose on the evening of surgery.  7. Wear dark glasses as necessary for excessive light sensitivity.  8. Do no forcefully rub you your operative eye.  9. Keep your operative eye dry for 1 week. You may gently clean your eyelids with a damp washcloth.  10. You may resume normal occupational activities in one week and resume driving as tolerated after the first post operative visit.  11. It is normal to have blurred vision and a scratchy sensation following surgery.  Dr. Iona Hansen: [phone]

## 2014-03-25 NOTE — H&P (Signed)
I have reviewed the pre printed H&P, the patient was re-examined, and I have identified no significant interval changes in the patient's medical condition.  There is no change in the plan of care since the history and physical of record. 

## 2014-03-26 ENCOUNTER — Encounter (HOSPITAL_COMMUNITY): Payer: Self-pay | Admitting: Ophthalmology

## 2014-05-07 ENCOUNTER — Encounter: Payer: Self-pay | Admitting: Gastroenterology

## 2014-06-13 NOTE — Patient Instructions (Signed)
Your procedure is scheduled on: 06/24/2014  Report to [facility] at  91   AM.  Call this number if you have problems the morning of surgery: [phone]   Do not eat food or drink liquids :After Midnight.      Take these medicines the morning of surgery with A SIP OF WATER: hydrodiuril, lexapro   Do not wear jewelry, make-up or nail polish.  Do not wear lotions, powders, or perfumes.   Do not shave 48 hours prior to surgery.  Do not bring valuables to the hospital.  Contacts, dentures or bridgework may not be worn into surgery.  Leave suitcase in the car. After surgery it may be brought to your room.  For patients admitted to the hospital, checkout time is 11:00 AM the day of discharge.   Patients discharged the day of surgery will not be allowed to drive home.  :     Please read over the following fact sheets that you were given: Coughing and Deep Breathing, Surgical Site Infection Prevention, Anesthesia Post-op Instructions and Care and Recovery After Surgery    Cataract A cataract is a clouding of the lens of the eye. When a lens becomes cloudy, vision is reduced based on the degree and nature of the clouding. Many cataracts reduce vision to some degree. Some cataracts make people more near-sighted as they develop. Other cataracts increase glare. Cataracts that are ignored and become worse can sometimes look white. The white color can be seen through the pupil. CAUSES   Aging. However, cataracts may occur at any age, even in newborns.   Certain drugs.   Trauma to the eye.   Certain diseases such as diabetes.   Specific eye diseases such as chronic inflammation inside the eye or a sudden attack of a rare form of glaucoma.   Inherited or acquired medical problems.  SYMPTOMS   Gradual, progressive drop in vision in the affected eye.   Severe, rapid visual loss. This most often happens when trauma is the cause.  DIAGNOSIS  To detect a cataract, an eye doctor examines the lens.  Cataracts are best diagnosed with an exam of the eyes with the pupils enlarged (dilated) by drops.  TREATMENT  For an early cataract, vision may improve by using different eyeglasses or stronger lighting. If that does not help your vision, surgery is the only effective treatment. A cataract needs to be surgically removed when vision loss interferes with your everyday activities, such as driving, reading, or watching TV. A cataract may also have to be removed if it prevents examination or treatment of another eye problem. Surgery removes the cloudy lens and usually replaces it with a substitute lens (intraocular lens, IOL).  At a time when both you and your doctor agree, the cataract will be surgically removed. If you have cataracts in both eyes, only one is usually removed at a time. This allows the operated eye to heal and be out of danger from any possible problems after surgery (such as infection or poor wound healing). In rare cases, a cataract may be doing damage to your eye. In these cases, your caregiver may advise surgical removal right away. The vast majority of people who have cataract surgery have better vision afterward. HOME CARE INSTRUCTIONS  If you are not planning surgery, you may be asked to do the following:  Use different eyeglasses.   Use stronger or brighter lighting.   Ask your eye doctor about reducing your medicine dose or changing  medicines if it is thought that a medicine caused your cataract. Changing medicines does not make the cataract go away on its own.   Become familiar with your surroundings. Poor vision can lead to injury. Avoid bumping into things on the affected side. You are at a higher risk for tripping or falling.   Exercise extreme care when driving or operating machinery.   Wear sunglasses if you are sensitive to bright light or experiencing problems with glare.  SEEK IMMEDIATE MEDICAL CARE IF:   You have a worsening or sudden vision loss.   You notice  redness, swelling, or increasing pain in the eye.   You have a fever.  Document Released: 03/08/2005 Document Revised: 02/25/2011 Document Reviewed: 10/30/2010 [facility] Patient Information 2012 [place].PATIENT INSTRUCTIONS POST-ANESTHESIA  IMMEDIATELY FOLLOWING SURGERY:  Do not drive or operate machinery for the first twenty four hours after surgery.  Do not make any important decisions for twenty four hours after surgery or while taking narcotic pain medications or sedatives.  If you develop intractable nausea and vomiting or a severe headache please notify your doctor immediately.  FOLLOW-UP:  Please make an appointment with your surgeon as instructed. You do not need to follow up with anesthesia unless specifically instructed to do so.  WOUND CARE INSTRUCTIONS (if applicable):  Keep a dry clean dressing on the anesthesia/puncture wound site if there is drainage.  Once the wound has quit draining you may leave it open to air.  Generally you should leave the bandage intact for twenty four hours unless there is drainage.  If the epidural site drains for more than 36-48 hours please call the anesthesia department.  QUESTIONS?:  Please feel free to call your physician or the hospital operator if you have any questions, and they will be happy to assist you.

## 2014-06-18 ENCOUNTER — Encounter (HOSPITAL_COMMUNITY)
Admission: RE | Admit: 2014-06-18 | Discharge: 2014-06-18 | Disposition: A | Discharge status: Home / Self Care | Payer: Medicare Other | Source: Ambulatory Visit | Attending: Ophthalmology | Admitting: Ophthalmology

## 2014-06-18 ENCOUNTER — Other Ambulatory Visit: Payer: Self-pay

## 2014-06-18 ENCOUNTER — Encounter (HOSPITAL_COMMUNITY): Payer: Self-pay

## 2014-06-18 DIAGNOSIS — Z01810 Encounter for preprocedural cardiovascular examination: Secondary | ICD-10-CM | POA: Diagnosis present

## 2014-06-18 DIAGNOSIS — Z01812 Encounter for preprocedural laboratory examination: Secondary | ICD-10-CM | POA: Insufficient documentation

## 2014-06-18 DIAGNOSIS — H269 Unspecified cataract: Secondary | ICD-10-CM | POA: Diagnosis not present

## 2014-06-18 LAB — CBC
HCT: 44.7 % (ref 39.0–52.0)
HEMOGLOBIN: 14.5 g/dL (ref 13.0–17.0)
MCH: 28 pg (ref 26.0–34.0)
MCHC: 32.4 g/dL (ref 30.0–36.0)
MCV: 86.5 fL (ref 78.0–100.0)
Platelets: 215 10*3/uL (ref 150–400)
RBC: 5.17 MIL/uL (ref 4.22–5.81)
RDW: 15 % (ref 11.5–15.5)
WBC: 8.8 10*3/uL (ref 4.0–10.5)

## 2014-06-18 LAB — BASIC METABOLIC PANEL
Anion gap: 7 (ref 5–15)
BUN: 16 mg/dL (ref 6–23)
CALCIUM: 8.3 mg/dL — AB (ref 8.4–10.5)
CHLORIDE: 105 mmol/L (ref 96–112)
CO2: 28 mmol/L (ref 19–32)
Creatinine, Ser: 0.73 mg/dL (ref 0.50–1.35)
GFR calc Af Amer: >90 mL/min (ref >90)
GFR calc non Af Amer: 89 mL/min — ABNORMAL LOW (ref >90)
Glucose, Bld: 101 mg/dL — ABNORMAL HIGH (ref 70–99)
POTASSIUM: 3.6 mmol/L (ref 3.5–5.1)
Sodium: 140 mmol/L (ref 135–145)

## 2014-06-18 NOTE — Progress Notes (Signed)
   06/18/14 1005  OBSTRUCTIVE SLEEP APNEA  Have you ever been diagnosed with sleep apnea through a sleep study? No  Do you snore loudly (loud enough to be heard through closed doors)?  0  Do you often feel tired, fatigued, or sleepy during the daytime? 0  Has anyone observed you stop breathing during your sleep? 0  Do you have, or are you being treated for high blood pressure? 1  BMI more than 35 kg/m2? 1  Age over 75 years old? 1  Neck circumference greater than 40 cm/16 inches? 0  Gender: 1

## 2014-06-24 ENCOUNTER — Encounter (HOSPITAL_COMMUNITY): Payer: Self-pay | Admitting: *Deleted

## 2014-06-24 ENCOUNTER — Ambulatory Visit (HOSPITAL_COMMUNITY): Payer: Medicare Other | Admitting: Anesthesiology

## 2014-06-24 ENCOUNTER — Ambulatory Visit (HOSPITAL_COMMUNITY)
Admission: RE | Admit: 2014-06-24 | Discharge: 2014-06-24 | Disposition: A | Discharge status: Home / Self Care | Payer: Medicare Other | Source: Ambulatory Visit | Attending: Ophthalmology | Admitting: Ophthalmology

## 2014-06-24 ENCOUNTER — Encounter (HOSPITAL_COMMUNITY)
Admission: RE | Disposition: A | Discharge status: Home / Self Care | Payer: Self-pay | Source: Ambulatory Visit | Attending: Ophthalmology

## 2014-06-24 DIAGNOSIS — I739 Peripheral vascular disease, unspecified: Secondary | ICD-10-CM | POA: Insufficient documentation

## 2014-06-24 DIAGNOSIS — H2512 Age-related nuclear cataract, left eye: Secondary | ICD-10-CM | POA: Diagnosis present

## 2014-06-24 DIAGNOSIS — Z79899 Other long term (current) drug therapy: Secondary | ICD-10-CM | POA: Diagnosis not present

## 2014-06-24 DIAGNOSIS — I10 Essential (primary) hypertension: Secondary | ICD-10-CM | POA: Diagnosis not present

## 2014-06-24 DIAGNOSIS — M1990 Unspecified osteoarthritis, unspecified site: Secondary | ICD-10-CM | POA: Diagnosis not present

## 2014-06-24 DIAGNOSIS — Z87891 Personal history of nicotine dependence: Secondary | ICD-10-CM | POA: Insufficient documentation

## 2014-06-24 HISTORY — PX: CATARACT EXTRACTION W/PHACO: SHX586

## 2014-06-24 SURGERY — PHACOEMULSIFICATION, CATARACT, WITH IOL INSERTION
Anesthesia: Monitor Anesthesia Care | Site: Eye | Laterality: Left

## 2014-06-24 MED ORDER — LACTATED RINGERS IV SOLN
INTRAVENOUS | Status: DC
  Administered 2014-06-24: 07:00:00 via INTRAVENOUS

## 2014-06-24 MED ORDER — FENTANYL CITRATE 0.05 MG/ML IJ SOLN
INTRAMUSCULAR | Status: AC
  Filled 2014-06-24: qty 2

## 2014-06-24 MED ORDER — MIDAZOLAM HCL 2 MG/2ML IJ SOLN
1.0000 mg | INTRAMUSCULAR | Status: DC | PRN
  Administered 2014-06-24: 2 mg via INTRAVENOUS

## 2014-06-24 MED ORDER — BSS IO SOLN
INTRAOCULAR | Status: DC | PRN
  Administered 2014-06-24: 15 mL

## 2014-06-24 MED ORDER — TETRACAINE 0.5 % OP SOLN OPTIME - NO CHARGE
OPHTHALMIC | Status: DC | PRN
  Administered 2014-06-24: 1 [drp] via OPHTHALMIC

## 2014-06-24 MED ORDER — LIDOCAINE HCL 3.5 % OP GEL
OPHTHALMIC | Status: DC | PRN
  Administered 2014-06-24: 1 via OPHTHALMIC

## 2014-06-24 MED ORDER — TETRACAINE HCL 0.5 % OP SOLN
1.0000 [drp] | OPHTHALMIC | Status: AC
  Administered 2014-06-24 (×3): 1 [drp] via OPHTHALMIC
  Filled 2014-06-24: qty 2

## 2014-06-24 MED ORDER — CYCLOPENTOLATE-PHENYLEPHRINE OP SOLN OPTIME - NO CHARGE
OPHTHALMIC | Status: AC
  Filled 2014-06-24: qty 2

## 2014-06-24 MED ORDER — MIDAZOLAM HCL 2 MG/2ML IJ SOLN
INTRAMUSCULAR | Status: AC
  Filled 2014-06-24: qty 2

## 2014-06-24 MED ORDER — NA HYALUR & NA CHOND-NA HYALUR 0.55-0.5 ML IO KIT
PACK | INTRAOCULAR | Status: DC | PRN
  Administered 2014-06-24: 1 via OPHTHALMIC

## 2014-06-24 MED ORDER — FENTANYL CITRATE 0.05 MG/ML IJ SOLN
25.0000 ug | INTRAMUSCULAR | Status: AC
  Administered 2014-06-24 (×2): 25 ug via INTRAVENOUS

## 2014-06-24 MED ORDER — PHENYLEPHRINE-KETOROLAC 1-0.3 % IO SOLN
INTRAOCULAR | Status: DC | PRN
  Administered 2014-06-24: 500 mL via OPHTHALMIC

## 2014-06-24 MED ORDER — LIDOCAINE HCL 3.5 % OP GEL
1.0000 "application " | Freq: Once | OPHTHALMIC | Status: AC
  Administered 2014-06-24: 1 via OPHTHALMIC
  Filled 2014-06-24: qty 1

## 2014-06-24 MED ORDER — CYCLOPENTOLATE-PHENYLEPHRINE 0.2-1 % OP SOLN
1.0000 [drp] | OPHTHALMIC | Status: AC | PRN
  Administered 2014-06-24 (×3): 1 [drp] via OPHTHALMIC

## 2014-06-24 MED ORDER — CYCLOPENTOLATE-PHENYLEPHRINE 0.2-1 % OP SOLN
1.0000 [drp] | Freq: Once | OPHTHALMIC | Status: AC
  Administered 2014-06-24: 1 [drp] via OPHTHALMIC

## 2014-06-24 SURGICAL SUPPLY — 8 items
CLOTH BEACON ORANGE TIMEOUT ST (SAFETY) ×2 IMPLANT
GLOVE BIOGEL PI IND STRL 6.5 (GLOVE) IMPLANT
GLOVE BIOGEL PI INDICATOR 6.5 (GLOVE) ×2
GLOVE EXAM NITRILE MD LF STRL (GLOVE) ×2 IMPLANT
INST SET CATARACT ~~LOC~~ (KITS) ×3 IMPLANT
LENS ALC ACRYL/TECN (Ophthalmic Related) ×3 IMPLANT
PAD ARMBOARD 7.5X6 YLW CONV (MISCELLANEOUS) ×2 IMPLANT
WATER STERILE IRR 250ML POUR (IV SOLUTION) ×2 IMPLANT

## 2014-06-24 NOTE — Anesthesia Postprocedure Evaluation (Signed)
  Anesthesia Post-op Note  Patient: Gabriel Love  Procedure(s) Performed: Procedure(s) with comments: CATARACT EXTRACTION PHACO AND INTRAOCULAR LENS PLACEMENT (I[facility]) (Left) - CDE:8.45  Patient Location: Short Stay  Anesthesia Type:MAC  Level of Consciousness: awake, alert , oriented and patient cooperative  Airway and Oxygen Therapy: Patient Spontanous Breathing  Post-op Pain: none  Post-op Assessment: Post-op Vital signs reviewed, Patient's Cardiovascular Status Stable, Respiratory Function Stable, Patent Airway and Pain level controlled  Post-op Vital Signs: Reviewed and stable  Last Vitals:  Filed Vitals:   06/24/14 0720  BP: 128/69  Temp:   Resp: 20    Complications: No apparent anesthesia complications

## 2014-06-24 NOTE — Transfer of Care (Signed)
Immediate Anesthesia Transfer of Care Note  Patient: Gabriel Love  Procedure(s) Performed: Procedure(s) with comments: CATARACT EXTRACTION PHACO AND INTRAOCULAR LENS PLACEMENT ([facility]) (Left) - CDE:8.45  Patient Location: Short Stay  Anesthesia Type:MAC  Level of Consciousness: awake, alert , oriented and patient cooperative  Airway & Oxygen Therapy: Patient Spontanous Breathing  Post-op Assessment: Report given to RN  Post vital signs: Reviewed and stable  Last Vitals:  Filed Vitals:   06/24/14 0720  BP: 128/69  Temp:   Resp: 20    Complications: No apparent anesthesia complications

## 2014-06-24 NOTE — H&P (Signed)
I have reviewed the pre printed H&P, the patient was re-examined, and I have identified no significant interval changes in the patient's medical condition.  There is no change in the plan of care since the history and physical of record. 

## 2014-06-24 NOTE — Op Note (Signed)
06/24/2014  8:05 AM  PATIENT:  Gabriel Love  74 y.o. male  PRE-OPERATIVE DIAGNOSIS:  nuclear cataract left eye  POST-OPERATIVE DIAGNOSIS:  nuclear cataract left eye  PROCEDURE:  Procedure(s): CATARACT EXTRACTION PHACO AND INTRAOCULAR LENS PLACEMENT ([place])  SURGEON:  Surgeon(s): Williams Che, MD  ASSISTANTS: Cleda Clarks, CST   ANESTHESIA STAFF: Anesthesiologist: Lerry Liner, MD CRNA: Charmaine Downs, CRNA  ANESTHESIA:   topical and MAC  REQUESTED LENS POWER: 24.5  LENS IMPLANT INFORMATION:   Alcon SN60WF s/n [id number]  Exp 04/2018  CUMULATIVE DISSIPATED ENERGY:8.45  INDICATIONS:see scanned office H&P  OP FINDINGS:dense NS and cortical  COMPLICATIONS:None  PROCEDURE:  06/24/2014  8:05 AM  PATIENT:  Gabriel Love  74 y.o. male  PRE-OPERATIVE DIAGNOSIS:  nuclear cataract left eye  POST-OPERATIVE DIAGNOSIS:  nuclear cataract left eye  PROCEDURE:  Procedure(s): CATARACT EXTRACTION PHACO AND INTRAOCULAR LENS PLACEMENT (I[facility])  SURGEON:  Surgeon(s): Williams Che, MD  ASSISTANTS: Cleda Clarks, CST   ANESTHESIA STAFF: Anesthesiologist: Lerry Liner, MD CRNA: Charmaine Downs, CRNA  ANESTHESIA:   topical and MAC  REQUESTED LENS POWER: 24.5  LENS IMPLANT INFORMATION:   Alcon SN60WF s/n [id number]  Exp 04/2018  CUMULATIVE DISSIPATED ENERGY:8.45  INDICATIONS:see scanned office H&P  OP FINDINGS:dense NS and cortical  COMPLICATIONS:None  PROCEDURE:  The patient was brought to the operating room in good condition.  The operative eye was prepped and draped in the usual fashion for intraocular surgery.  Lidocaine gel was dropped onto the eye.  A 2.4 mm 10 O'clock near clear corneal stepped incision and a 12 O'clock stab incision were created.  Viscoat was instilled into the anterior chamber.  The 5 mm anterior capsulorhexis was performed with a bent needle cystotome and Utrata forceps.  The lens was hydrodissected and hydrodelineated with a  cannula and balanced salt solution and rotated with a Kuglen hook.  Phacoemulsification was perfomed in the divide and conquer technique.  The remaining cortex was removed with I&A and the capsular surfaces polished as necessary.  Provisc was placed into the capsular bag and the lens inserted with the Alcon inserter.  The viscoelastic was removed with I&A and the lens "rocked" into position.  The wounds were hydrated and te anterior chamber was refilled with balanced salt solution.  The wounds were checked for leakage and rehydrated as necessary.  The lid speculum and drapes were removed and the patient was transported to short stay in good condition.

## 2014-06-24 NOTE — Anesthesia Preprocedure Evaluation (Signed)
Anesthesia Evaluation  Patient identified by MRN, date of birth, ID band Patient awake    Reviewed: Allergy & Precautions, H&P , NPO status , Patient's Chart, lab work & pertinent test results  Airway Mallampati: III  TM Distance: >3 FB Neck ROM: Full    Dental  (+) Edentulous Upper, Edentulous Lower   Pulmonary neg pulmonary ROS, former smoker,  breath sounds clear to auscultation  Pulmonary exam normal       Cardiovascular hypertension, Pt. on medications + Peripheral Vascular Disease Rhythm:Regular Rate:Normal     Neuro/Psych PSYCHIATRIC DISORDERS Depression negative neurological ROS  negative psych ROS   GI/Hepatic negative GI ROS, Neg liver ROS,   Endo/Other  Morbid obesity  Renal/GU negative Renal ROS  negative genitourinary   Musculoskeletal negative musculoskeletal ROS (+) Arthritis -,   Abdominal   Peds  Hematology negative hematology ROS (+)   Anesthesia Other Findings   Reproductive/Obstetrics                             Anesthesia Physical Anesthesia Plan  ASA: III  Anesthesia Plan: MAC   Post-op Pain Management:    Induction: Intravenous  Airway Management Planned: Nasal Cannula  Additional Equipment:   Intra-op Plan:   Post-operative Plan:   Informed Consent: I have reviewed the patients History and Physical, chart, labs and discussed the procedure including the risks, benefits and alternatives for the proposed anesthesia with the patient or authorized representative who has indicated his/her understanding and acceptance.     Plan Discussed with:   Anesthesia Plan Comments:         Anesthesia Quick Evaluation

## 2014-06-24 NOTE — Discharge Instructions (Signed)
Gabriel Love 06/24/2014 Dr. Iona Hansen Post operative Instructions for Cataract Patients  These instructions are for Gabriel Love and pertain to the operative eye.  1.  Resume your normal diet and previous oral medicines.  2. Your Follow-up appointment is at Dr. Iona Hansen' office in [place] on 06/25/14 @ 10:15  3. You may leave the hospital when your driver is present and your nurse releases you.  4. Begin Pred Forte (prednisolone acetate 1%), Acular LS (ketorolac tromethamine .4%) and Gatifloxacin 0.5% eye drops; 1 drop each 4 times daily to operative eye. Begin when you arrive home Moxifloxacin 0.5% may be substituted for Gatifloxacin using the same instructions.  6. Page Dr. Iona Hansen via beeper [phone] for significant pain in or around operative eye that is not relieved by Tylenol.  6. If you took Plavix before surgery, restart it at the usual dose on the evening of surgery.  7. Wear dark glasses as necessary for excessive light sensitivity.  8. Do no forcefully rub you your operative eye.  9. Keep your operative eye dry for 1 week. You may gently clean your eyelids with a damp washcloth.  10. You may resume normal occupational activities in one week and resume driving as tolerated after the first post operative visit.  11. It is normal to have blurred vision and a scratchy sensation following surgery.  Dr. Iona Hansen: [phone]

## 2014-06-24 NOTE — Brief Op Note (Signed)
06/24/2014  8:05 AM  PATIENT:  [name]  74 y.o. male  PRE-OPERATIVE DIAGNOSIS:  nuclear cataract left eye  POST-OPERATIVE DIAGNOSIS:  nuclear cataract left eye  PROCEDURE:  Procedure(s): CATARACT EXTRACTION PHACO AND INTRAOCULAR LENS PLACEMENT (I[facility])  SURGEON:  Surgeon(s): Williams Che, MD  ASSISTANTS: Cleda Clarks, CST   ANESTHESIA STAFF: Anesthesiologist: Lerry Liner, MD CRNA: Charmaine Downs, CRNA  ANESTHESIA:   topical and MAC  REQUESTED LENS POWER: 24.5  LENS IMPLANT INFORMATION:   Alcon SN60WF s/n [id number]  Exp 04/2018  CUMULATIVE DISSIPATED ENERGY:8.45  INDICATIONS:see scanned office H&P  OP FINDINGS:dense NS and cortical  COMPLICATIONS:None  DICTATION #: none  PLAN OF CARE: as acheduled  PATIENT DISPOSITION:  Short Stay

## 2014-06-26 ENCOUNTER — Encounter (HOSPITAL_COMMUNITY): Payer: Self-pay | Admitting: Ophthalmology

## 2014-11-06 ENCOUNTER — Encounter: Payer: Self-pay | Admitting: Gastroenterology

## 2014-12-16 ENCOUNTER — Encounter: Payer: Self-pay | Admitting: Gastroenterology

## 2014-12-23 ENCOUNTER — Other Ambulatory Visit: Payer: Self-pay | Admitting: Family Medicine

## 2014-12-24 ENCOUNTER — Ambulatory Visit (AMBULATORY_SURGERY_CENTER): Payer: Self-pay

## 2014-12-24 VITALS — Ht 71.0 in | Wt 350.2 lb

## 2014-12-24 DIAGNOSIS — Z85038 Personal history of other malignant neoplasm of large intestine: Secondary | ICD-10-CM

## 2014-12-24 NOTE — Progress Notes (Signed)
Pt came into the office today for his pre-visit.Pt's weight was 350.2 lbs.Pt was informed we had weight restrictions and he would need to be rescheduled at the hospital.Pt and his wife decided to call back later to reschedule his appointment and he would try to lose weight until then.The pt did not want to be scheduled at the hospital.They will call if they have further questions.

## 2015-01-03 ENCOUNTER — Other Ambulatory Visit: Payer: Self-pay | Admitting: Family Medicine

## 2015-01-09 ENCOUNTER — Encounter: Payer: Medicare Other | Admitting: Gastroenterology

## 2015-01-15 ENCOUNTER — Telehealth: Payer: Self-pay | Admitting: Family Medicine

## 2015-01-15 ENCOUNTER — Encounter: Payer: Medicare Other | Admitting: Family Medicine

## 2015-01-15 DIAGNOSIS — Z0289 Encounter for other administrative examinations: Secondary | ICD-10-CM

## 2015-01-15 NOTE — Telephone Encounter (Signed)
Pt was on schedule for today for CPE with Dr. Sarajane Jews. I called and pt though it was for 01/16/2015, I advised pt to reschedule.

## 2015-01-16 ENCOUNTER — Ambulatory Visit (INDEPENDENT_AMBULATORY_CARE_PROVIDER_SITE_OTHER): Payer: Medicare Other | Admitting: Family Medicine

## 2015-01-16 DIAGNOSIS — Z23 Encounter for immunization: Secondary | ICD-10-CM

## 2015-01-19 ENCOUNTER — Other Ambulatory Visit: Payer: Self-pay | Admitting: Family Medicine

## 2015-02-27 ENCOUNTER — Ambulatory Visit (AMBULATORY_SURGERY_CENTER): Payer: Self-pay

## 2015-02-27 ENCOUNTER — Encounter: Payer: Self-pay | Admitting: Gastroenterology

## 2015-02-27 ENCOUNTER — Telehealth: Payer: Self-pay | Admitting: Gastroenterology

## 2015-02-27 VITALS — Ht 71.0 in | Wt 344.0 lb

## 2015-02-27 DIAGNOSIS — Z86010 Personal history of colonic polyps: Secondary | ICD-10-CM

## 2015-02-27 NOTE — Progress Notes (Signed)
No egg or soy allergies Not on home 02 No previous anesthesia complications No diet or weight loss meds 

## 2015-02-27 NOTE — Telephone Encounter (Signed)
A user error has taken place.

## 2015-02-27 NOTE — Telephone Encounter (Signed)
Advised wife pt does need PV today . Instructed to check in 3rd floor 115 pm and then PV today 130pm  Gabriel Love PV

## 2015-02-27 NOTE — Telephone Encounter (Signed)
Patient called in wanting to know how he should prepare for upcoming procedure on 12/14. He states that he had pre visit appt back in October but our office cancelled appt while he was there because of Dr. Deatra Ina leaving. I scheduled pre visit appointment for this afternoon. Does patient need this pre visit appointment? Thanks.

## 2015-02-28 ENCOUNTER — Encounter: Payer: Self-pay | Admitting: Family Medicine

## 2015-02-28 ENCOUNTER — Ambulatory Visit (INDEPENDENT_AMBULATORY_CARE_PROVIDER_SITE_OTHER): Payer: Medicare Other | Admitting: Family Medicine

## 2015-02-28 VITALS — BP 142/96 | HR 106 | Temp 97.8°F | Ht 71.0 in | Wt 342.0 lb

## 2015-02-28 DIAGNOSIS — E785 Hyperlipidemia, unspecified: Secondary | ICD-10-CM

## 2015-02-28 DIAGNOSIS — Z125 Encounter for screening for malignant neoplasm of prostate: Secondary | ICD-10-CM | POA: Diagnosis not present

## 2015-02-28 DIAGNOSIS — Z0000 Encounter for general adult medical examination without abnormal findings: Secondary | ICD-10-CM

## 2015-02-28 DIAGNOSIS — M25512 Pain in left shoulder: Secondary | ICD-10-CM

## 2015-02-28 DIAGNOSIS — R32 Unspecified urinary incontinence: Secondary | ICD-10-CM | POA: Diagnosis not present

## 2015-02-28 DIAGNOSIS — R739 Hyperglycemia, unspecified: Secondary | ICD-10-CM

## 2015-02-28 DIAGNOSIS — M25511 Pain in right shoulder: Secondary | ICD-10-CM

## 2015-02-28 LAB — BASIC METABOLIC PANEL
BUN: 22 mg/dL (ref 6–23)
CO2: 34 mEq/L — ABNORMAL HIGH (ref 19–32)
Calcium: 8.9 mg/dL (ref 8.4–10.5)
Chloride: 100 mEq/L (ref 96–112)
Creatinine, Ser: 0.78 mg/dL (ref 0.40–1.50)
GFR: 102.96 mL/min (ref >60.00)
Glucose, Bld: 95 mg/dL (ref 70–99)
POTASSIUM: 4.1 meq/L (ref 3.5–5.1)
Sodium: 142 mEq/L (ref 135–145)

## 2015-02-28 LAB — POCT URINALYSIS DIPSTICK
BILIRUBIN UA: NEGATIVE
Blood, UA: NEGATIVE
GLUCOSE UA: NEGATIVE
Ketones, UA: NEGATIVE
NITRITE UA: NEGATIVE
Spec Grav, UA: 1.025
Urobilinogen, UA: 0.2
pH, UA: 5.5

## 2015-02-28 LAB — CBC WITH DIFFERENTIAL/PLATELET
BASOS PCT: 0.2 % (ref 0.0–3.0)
Basophils Absolute: 0 10*3/uL (ref 0.0–0.1)
EOS PCT: 1.1 % (ref 0.0–5.0)
Eosinophils Absolute: 0.1 10*3/uL (ref 0.0–0.7)
HEMATOCRIT: 49.7 % (ref 39.0–52.0)
HEMOGLOBIN: 16.3 g/dL (ref 13.0–17.0)
LYMPHS PCT: 18.7 % (ref 12.0–46.0)
Lymphs Abs: 2 10*3/uL (ref 0.7–4.0)
MCHC: 32.8 g/dL (ref 30.0–36.0)
MCV: 85.8 fl (ref 78.0–100.0)
MONOS PCT: 8.7 % (ref 3.0–12.0)
Monocytes Absolute: 0.9 10*3/uL (ref 0.1–1.0)
Neutro Abs: 7.7 10*3/uL (ref 1.4–7.7)
Neutrophils Relative %: 71.3 % (ref 43.0–77.0)
Platelets: 222 10*3/uL (ref 150.0–400.0)
RBC: 5.78 Mil/uL (ref 4.22–5.81)
RDW: 15 % (ref 11.5–15.5)
WBC: 10.8 10*3/uL — ABNORMAL HIGH (ref 4.0–10.5)

## 2015-02-28 LAB — TSH: TSH: 2.49 u[IU]/mL (ref 0.35–4.50)

## 2015-02-28 LAB — LIPID PANEL
CHOLESTEROL: 206 mg/dL — AB (ref 0–200)
HDL: 37.5 mg/dL — ABNORMAL LOW (ref >39.00)
LDL CALC: 133 mg/dL — AB (ref 0–99)
NonHDL: 168.72
Total CHOL/HDL Ratio: 5
Triglycerides: 180 mg/dL — ABNORMAL HIGH (ref 0.0–149.0)
VLDL: 36 mg/dL (ref 0.0–40.0)

## 2015-02-28 LAB — HEPATIC FUNCTION PANEL
ALK PHOS: 75 U/L (ref 39–117)
ALT: 25 U/L (ref 0–53)
AST: 26 U/L (ref 0–37)
Albumin: 4.1 g/dL (ref 3.5–5.2)
Bilirubin, Direct: 0.1 mg/dL (ref 0.0–0.3)
Total Bilirubin: 0.6 mg/dL (ref 0.2–1.2)
Total Protein: 6.9 g/dL (ref 6.0–8.3)

## 2015-02-28 LAB — PSA: PSA: 0.85 ng/mL (ref 0.10–4.00)

## 2015-02-28 LAB — HEMOGLOBIN A1C: Hgb A1c MFr Bld: 5.6 % (ref 4.6–6.5)

## 2015-02-28 MED ORDER — ATORVASTATIN CALCIUM 40 MG PO TABS: 40.0000 mg | ORAL_TABLET | Freq: Every day | ORAL | Status: DC

## 2015-02-28 MED ORDER — HYDROCHLOROTHIAZIDE 25 MG PO TABS: 25.0000 mg | ORAL_TABLET | Freq: Every day | ORAL | Status: DC

## 2015-02-28 NOTE — Progress Notes (Signed)
   Subjective:    Patient ID: Gabriel Love, male    DOB: 03-Apr-1940, 74 y.o.   MRN: [id number]  HPI 74 yr old male for a cpx. He has a few items to discuss. He has had pain in both shoulders for several years and it is getting worse. Also he has had incontinence of urine for about 6 months. No burning or discomfort. He had cataract surgery this summer and this was very successful.    Review of Systems  Constitutional: Negative.   HENT: Negative.   Eyes: Negative.   Respiratory: Negative.   Cardiovascular: Negative.   Gastrointestinal: Negative.   Genitourinary: Negative.   Musculoskeletal: Positive for arthralgias. Negative for myalgias, back pain, joint swelling, gait problem, neck pain and neck stiffness.  Skin: Negative.   Neurological: Negative.   Psychiatric/Behavioral: Negative.        Objective:   Physical Exam  Constitutional: He is oriented to person, place, and time. He appears well-developed and well-nourished. No distress.  HENT:  Head: Normocephalic and atraumatic.  Right Ear: External ear normal.  Left Ear: External ear normal.  Nose: Nose normal.  Mouth/Throat: Oropharynx is clear and moist. No oropharyngeal exudate.  Eyes: Conjunctivae and EOM are normal. Pupils are equal, round, and reactive to light. Right eye exhibits no discharge. Left eye exhibits no discharge. No scleral icterus.  Neck: Neck supple. No JVD present. No tracheal deviation present. No thyromegaly present.  Cardiovascular: Normal rate, regular rhythm, normal heart sounds and intact distal pulses.  Exam reveals no gallop and no friction rub.   No murmur heard. Pulmonary/Chest: Effort normal and breath sounds normal. No respiratory distress. He has no wheezes. He has no rales. He exhibits no tenderness.  Abdominal: Soft. Bowel sounds are normal. He exhibits no distension and no mass. There is no tenderness. There is no rebound and no guarding.  Musculoskeletal: Normal range of motion. He exhibits  no edema or tenderness.  Lymphadenopathy:    He has no cervical adenopathy.  Neurological: He is alert and oriented to person, place, and time. He has normal reflexes. No cranial nerve deficit. He exhibits normal muscle tone. Coordination normal.  Skin: Skin is warm and dry. No rash noted. He is not diaphoretic. No erythema. No pallor.  Psychiatric: He has a normal mood and affect. His behavior is normal. Judgment and thought content normal.          Assessment & Plan:  Well exam. We discussed diet and exercise. He needs to lose a lot of weight. Refer to Orthopedics for the shoulder pain. Refer to Urology for the bladder issues.

## 2015-03-05 ENCOUNTER — Encounter: Payer: Self-pay | Admitting: Gastroenterology

## 2015-03-05 ENCOUNTER — Ambulatory Visit (AMBULATORY_SURGERY_CENTER): Payer: Medicare Other | Admitting: Gastroenterology

## 2015-03-05 VITALS — BP 144/82 | HR 62 | Temp 97.9°F | Resp 44 | Ht 71.0 in | Wt 342.0 lb

## 2015-03-05 DIAGNOSIS — D125 Benign neoplasm of sigmoid colon: Secondary | ICD-10-CM | POA: Diagnosis not present

## 2015-03-05 DIAGNOSIS — Z1211 Encounter for screening for malignant neoplasm of colon: Secondary | ICD-10-CM | POA: Diagnosis not present

## 2015-03-05 DIAGNOSIS — K635 Polyp of colon: Secondary | ICD-10-CM | POA: Diagnosis not present

## 2015-03-05 DIAGNOSIS — D123 Benign neoplasm of transverse colon: Secondary | ICD-10-CM

## 2015-03-05 DIAGNOSIS — D120 Benign neoplasm of cecum: Secondary | ICD-10-CM

## 2015-03-05 DIAGNOSIS — D127 Benign neoplasm of rectosigmoid junction: Secondary | ICD-10-CM

## 2015-03-05 MED ORDER — SODIUM CHLORIDE 0.9 % IV SOLN: 500.0000 mL | INTRAVENOUS | Status: DC

## 2015-03-05 NOTE — Patient Instructions (Signed)
YOU HAD AN ENDOSCOPIC PROCEDURE TODAY AT [facility]:   Refer to the procedure report that was given to you for any specific questions about what was found during the examination.  If the procedure report does not answer your questions, please call your gastroenterologist to clarify.  If you requested that your care partner not be given the details of your procedure findings, then the procedure report has been included in a sealed envelope for you to review at your convenience later.  YOU SHOULD EXPECT: Some feelings of bloating in the abdomen. Passage of more gas than usual.  Walking can help get rid of the air that was put into your GI tract during the procedure and reduce the bloating. If you had a lower endoscopy (such as a colonoscopy or flexible sigmoidoscopy) you may notice spotting of blood in your stool or on the toilet paper. If you underwent a bowel prep for your procedure, you may not have a normal bowel movement for a few days.  Please Note:  You might notice some irritation and congestion in your nose or some drainage.  This is from the oxygen used during your procedure.  There is no need for concern and it should clear up in a day or so.  SYMPTOMS TO REPORT IMMEDIATELY:   Following lower endoscopy (colonoscopy or flexible sigmoidoscopy):  Excessive amounts of blood in the stool  Significant tenderness or worsening of abdominal pains  Swelling of the abdomen that is new, acute  Fever of 100F or higher    For urgent or emergent issues, a gastroenterologist can be reached at any hour by calling [phone].   DIET: Your first meal following the procedure should be a small meal and then it is ok to progress to your normal diet. Heavy or fried foods are harder to digest and may make you feel nauseous or bloated.  Likewise, meals heavy in dairy and vegetables can increase bloating.  Drink plenty of fluids but you should avoid alcoholic beverages for 24  hours.  ACTIVITY:  You should plan to take it easy for the rest of today and you should NOT DRIVE or use heavy machinery until tomorrow (because of the sedation medicines used during the test).    FOLLOW UP: Our staff will call the number listed on your records the next business day following your procedure to check on you and address any questions or concerns that you may have regarding the information given to you following your procedure. If we do not reach you, we will leave a message.  However, if you are feeling well and you are not experiencing any problems, there is no need to return our call.  We will assume that you have returned to your regular daily activities without incident.  If any biopsies were taken you will be contacted by phone or by letter within the next 1-3 weeks.  Please call us at [phone] if you have not heard about the biopsies in 3 weeks.    SIGNATURES/CONFIDENTIALITY: You and/or your care partner have signed paperwork which will be entered into your electronic medical record.  These signatures attest to the fact that that the information above on your After Visit Summary has been reviewed and is understood.  Full responsibility of the confidentiality of this discharge information lies with you and/or your care-partner.  Polyp information given.  No aspirin or NSAIDS for 2 weeks.  Await biopsy results.  Card given with location of clip

## 2015-03-05 NOTE — Progress Notes (Signed)
Stable to RR 

## 2015-03-05 NOTE — Progress Notes (Signed)
Called to room to assist during endoscopic procedure.  Patient ID and intended procedure confirmed with present staff. Received instructions for my participation in the procedure from the performing physician.  

## 2015-03-06 ENCOUNTER — Telehealth: Payer: Self-pay | Admitting: *Deleted

## 2015-03-06 NOTE — Op Note (Signed)
[facility]  [address] [place], [id number]   COLONOSCOPY PROCEDURE REPORT  PATIENT: Gabriel, Love  MR#: [id number] BIRTHDATE: 22-Sep-1940 , 67  yrs. old GENDER: male ENDOSCOPIST: Yetta Flock, MD REFERRED BY: Alysia Penna MD PROCEDURE DATE:  03/05/2015 PROCEDURE:   Colonoscopy, surveillance , Colonoscopy with snare polypectomy, and Colonoscopy with biopsy First Screening Colonoscopy - Avg.  risk and is 50 yrs.  old or older - No.  Prior Negative Screening - Now for repeat screening. N/A  History of Adenoma - Now for follow-up colonoscopy & has been > or = to 3 yrs.  No.  It has been less than 3 yrs since last colonoscopy.  Other: See Comments  Polyps removed today? Yes ASA CLASS:   Class III INDICATIONS:Surveillance due to prior colonic neoplasia and Colorectal Neoplasm Risk Assessment for this procedure is average risk. MEDICATIONS: Propofol 500 mg IV and Lidocaine 40 mg IV  DESCRIPTION OF PROCEDURE:   After the risks benefits and alternatives of the procedure were thoroughly explained, informed consent was obtained.  The digital rectal exam revealed no abnormalities of the rectum.   The [id number]  endoscope was introduced through the anus and advanced to the surgical anastomosis. No adverse events experienced.   The quality of the prep was adequate  The instrument was then slowly withdrawn as the colon was fully examined. Estimated blood loss is zero unless otherwise noted in this procedure report.      COLON FINDINGS: The surgical anastomosis was noted in the right colon and appeared healthy.  A small sessile 29mm polyp was noted and removed with cold forceps.  A larger roughly 1cm adenomatous appearing sessile polyp was noted in close approximation to the anastomosis.  Attempt was made to remove it via hot snare however despite good grasp on the polyp, despite attempts with cautery and cut, the polyp was not able to be cut through.   The snare was stuck on the polyp for a few minutes after this but eventually was able to release it from the polyp.  I suspect this polyp may have been along the staple line and perhaps why it could not cut through. The polyp was otherwise biopsied to confirm it is an adenoma. It had a dusky appearance to it following attempts at removal. There was no bleeding following multiple attempts to remove it.  A 10-42mm sessile polyp was noted in the transverse colon and removed with hot snare.  There was concern for a possible blood vessel in the base of the polypectomy site and thus the polypectomy site was prophylactically closed with a hemostasis clip given the patient's aspirin use.  No evidence of bleeding was noted.  Another 37mm semi-pedunculated polyp was noted in the transverse colon and removed with hot snare.  A 75mm sessile polyp was noted in the transverse colon and removed with cold snare.  A 60mm sessile polyp was noted in the sigmoid polyp and removed with cold forceps. Moderate diverticulosis was noted in the left colon.  Retroflexed views revealed internal hemorrhoids. The time to cecum = 3.7 Withdrawal time = 43.3   The scope was withdrawn and the procedure completed. COMPLICATIONS: There were no immediate complications.  ENDOSCOPIC IMPRESSION: Multiple polyps as outlined above. One polyp not able to be removed at the anastomosis, suspect due to underlying staple although unclear. This polyp was biopsied. Another polyp removed and polypectomy site closed given its appearance and patient's aspirin use.  RECOMMENDATIONS: No aspirin  or NSAIDS for 2 weeks Await pathology results Resume diet Resume medications I anticipate a repeat colonoscopy will be warranted to further evaluate / remove anastomotic polyp, pending pathology results.   eSigned:  Yetta Flock, MD 03/05/2015 12:38 PM   cc: Alysia Penna MD, the patient   PATIENT NAME:  Gabriel, Love MR#:  [id number]

## 2015-03-06 NOTE — Telephone Encounter (Signed)
  Follow up Call-  Call back number 03/05/2015 06/19/2013  Post procedure Call Back phone  # [phone] or cell [phone]  Permission to leave phone message Yes Yes     Patient questions:  Do you have a fever, pain , or abdominal swelling? No. Pain Score  0 *  Have you tolerated food without any problems? Yes.    Have you been able to return to your normal activities? Yes.    Do you have any questions about your discharge instructions: Diet   No. Medications  No. Follow up visit  No.  Do you have questions or concerns about your Care? No.  Actions: * If pain score is 4 or above: No action needed, pain <4.

## 2015-03-25 ENCOUNTER — Other Ambulatory Visit: Payer: Self-pay | Admitting: *Deleted

## 2015-03-25 DIAGNOSIS — Z86010 Personal history of colonic polyps: Secondary | ICD-10-CM

## 2015-03-25 DIAGNOSIS — D126 Benign neoplasm of colon, unspecified: Secondary | ICD-10-CM

## 2015-04-22 ENCOUNTER — Telehealth: Payer: Self-pay | Admitting: Gastroenterology

## 2015-04-22 NOTE — Telephone Encounter (Signed)
Spoke with [facility] and cancelled patient on 06/10/15.

## 2015-04-22 NOTE — Telephone Encounter (Signed)
Spoke with patient's wife and she will be having surgery and cannot bring the patient. She wants to reschedule in May. She will call back next week to see if May schedule is out.

## 2015-05-15 ENCOUNTER — Telehealth: Payer: Self-pay | Admitting: Gastroenterology

## 2015-05-15 NOTE — Telephone Encounter (Signed)
Spoke with patient's wife and told her May scheduled is still not out.

## 2015-05-16 NOTE — Telephone Encounter (Signed)
Spoke with patient's wife Izora Gala and gave her appointment date, time. Pre visit scheduled on 07/22/15 at 10:00 AM.

## 2015-05-16 NOTE — Telephone Encounter (Signed)
Spoke with [facility] at [facility] endo and scheduled Prop colon with ACP on 07/29/15 at 9:15 AM. Left a message for patient's wife to call back.

## 2015-06-10 ENCOUNTER — Ambulatory Visit (HOSPITAL_COMMUNITY): Admission: RE | Admit: 2015-06-10 | Payer: Medicare Other | Source: Ambulatory Visit | Admitting: Gastroenterology

## 2015-06-10 ENCOUNTER — Encounter (HOSPITAL_COMMUNITY): Admission: RE | Payer: Self-pay | Source: Ambulatory Visit

## 2015-06-10 SURGERY — COLONOSCOPY WITH PROPOFOL
Anesthesia: Monitor Anesthesia Care

## 2015-06-18 ENCOUNTER — Ambulatory Visit (INDEPENDENT_AMBULATORY_CARE_PROVIDER_SITE_OTHER): Payer: Medicare Other | Admitting: Family Medicine

## 2015-06-18 ENCOUNTER — Encounter: Payer: Self-pay | Admitting: Family Medicine

## 2015-06-18 VITALS — BP 142/80 | HR 90 | Temp 98.8°F | Ht 71.0 in | Wt 351.0 lb

## 2015-06-18 DIAGNOSIS — R062 Wheezing: Secondary | ICD-10-CM

## 2015-06-18 MED ORDER — ALBUTEROL SULFATE HFA 108 (90 BASE) MCG/ACT IN AERS: 2.0000 | INHALATION_SPRAY | RESPIRATORY_TRACT | Status: DC | PRN

## 2015-06-18 NOTE — Progress Notes (Signed)
Pre visit review using our clinic review tool, if applicable. No additional management support is needed unless otherwise documented below in the visit note. 

## 2015-06-20 ENCOUNTER — Encounter: Payer: Self-pay | Admitting: Family Medicine

## 2015-06-20 NOTE — Progress Notes (Signed)
   Subjective:    Patient ID: Gabriel Love, male    DOB: 1940-11-03, 75 y.o.   MRN: [id number]  HPI Here for several days of wheezing and mild SOB. He has some dry coughing. No fever or sinus congestion or ST. He often has spring time allergies with runny nose and sneezing.    Review of Systems  Constitutional: Negative.   HENT: Negative.   Eyes: Negative.   Respiratory: Positive for cough, shortness of breath and wheezing. Negative for choking and chest tightness.   Cardiovascular: Negative.        Objective:   Physical Exam  Constitutional: He appears well-developed and well-nourished. No distress.  HENT:  Right Ear: External ear normal.  Left Ear: External ear normal.  Nose: Nose normal.  Mouth/Throat: Oropharynx is clear and moist.  Eyes: Conjunctivae are normal.  Neck: No thyromegaly present.  Cardiovascular: Normal rate, regular rhythm, normal heart sounds and intact distal pulses.   Pulmonary/Chest: Effort normal. No respiratory distress. He has no rales.  Soft scattered wheezes on forced expiration  Musculoskeletal: He exhibits no edema.  Lymphadenopathy:    He has no cervical adenopathy.          Assessment & Plan:  Wheezing. He may have some asthma. Try an albuterol inhaler. Recheck prn

## 2015-07-13 IMAGING — CR DG SHOULDER 2+V*L*
4 series · 4 of 4 positions shown · non-contrast
Comparison: None.

CLINICAL DATA: bilateral shoulder

EXAM:
LEFT SHOULDER - 2+ VIEW

[view not recorded (1 of 4)]
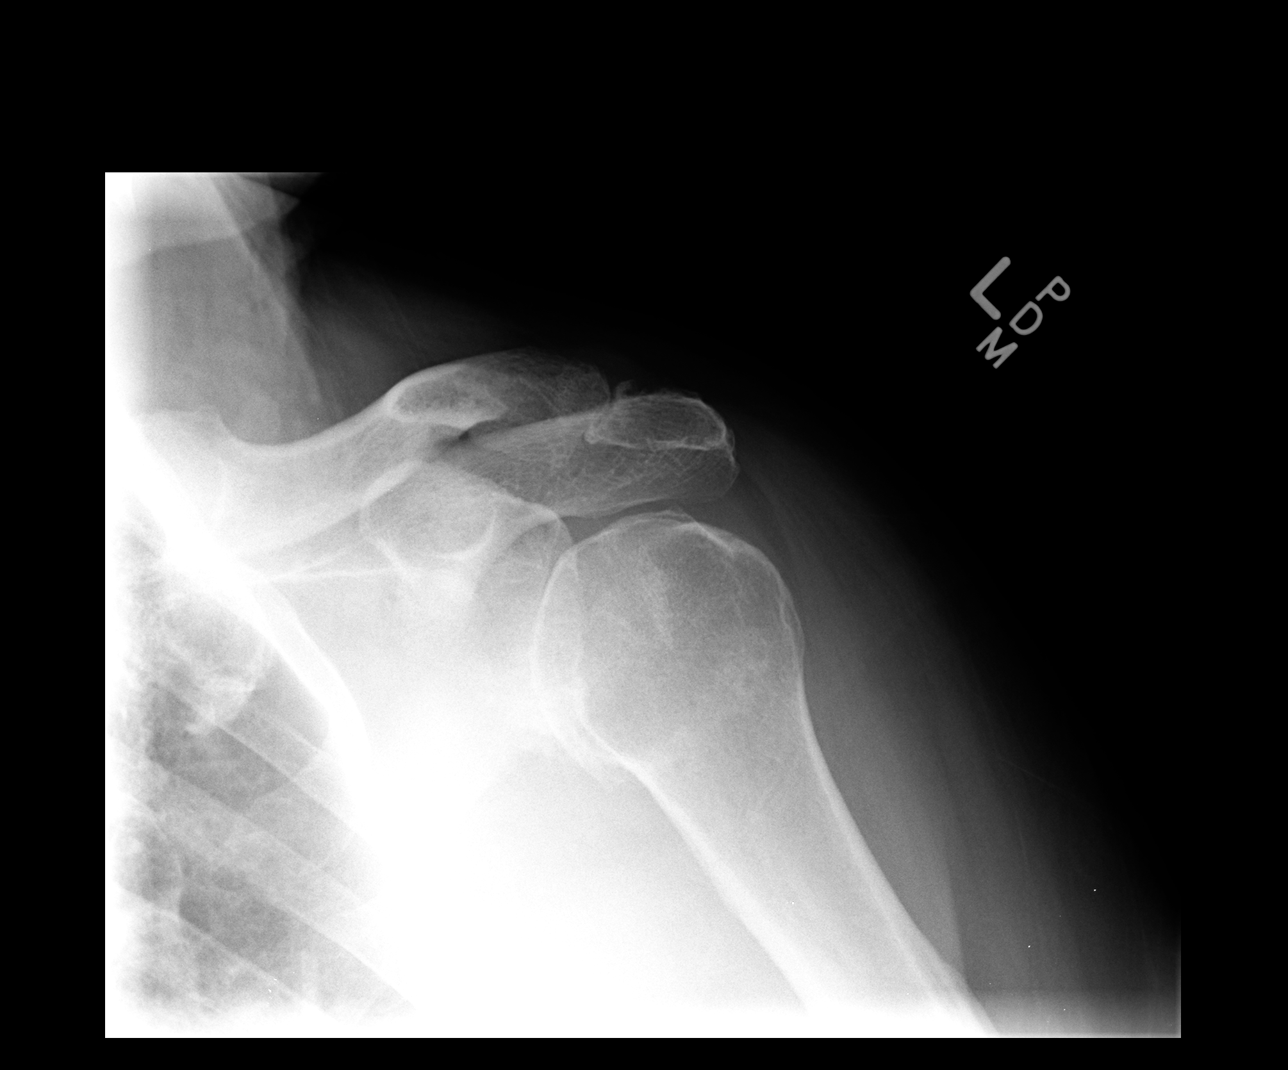

[view not recorded (2 of 4)]
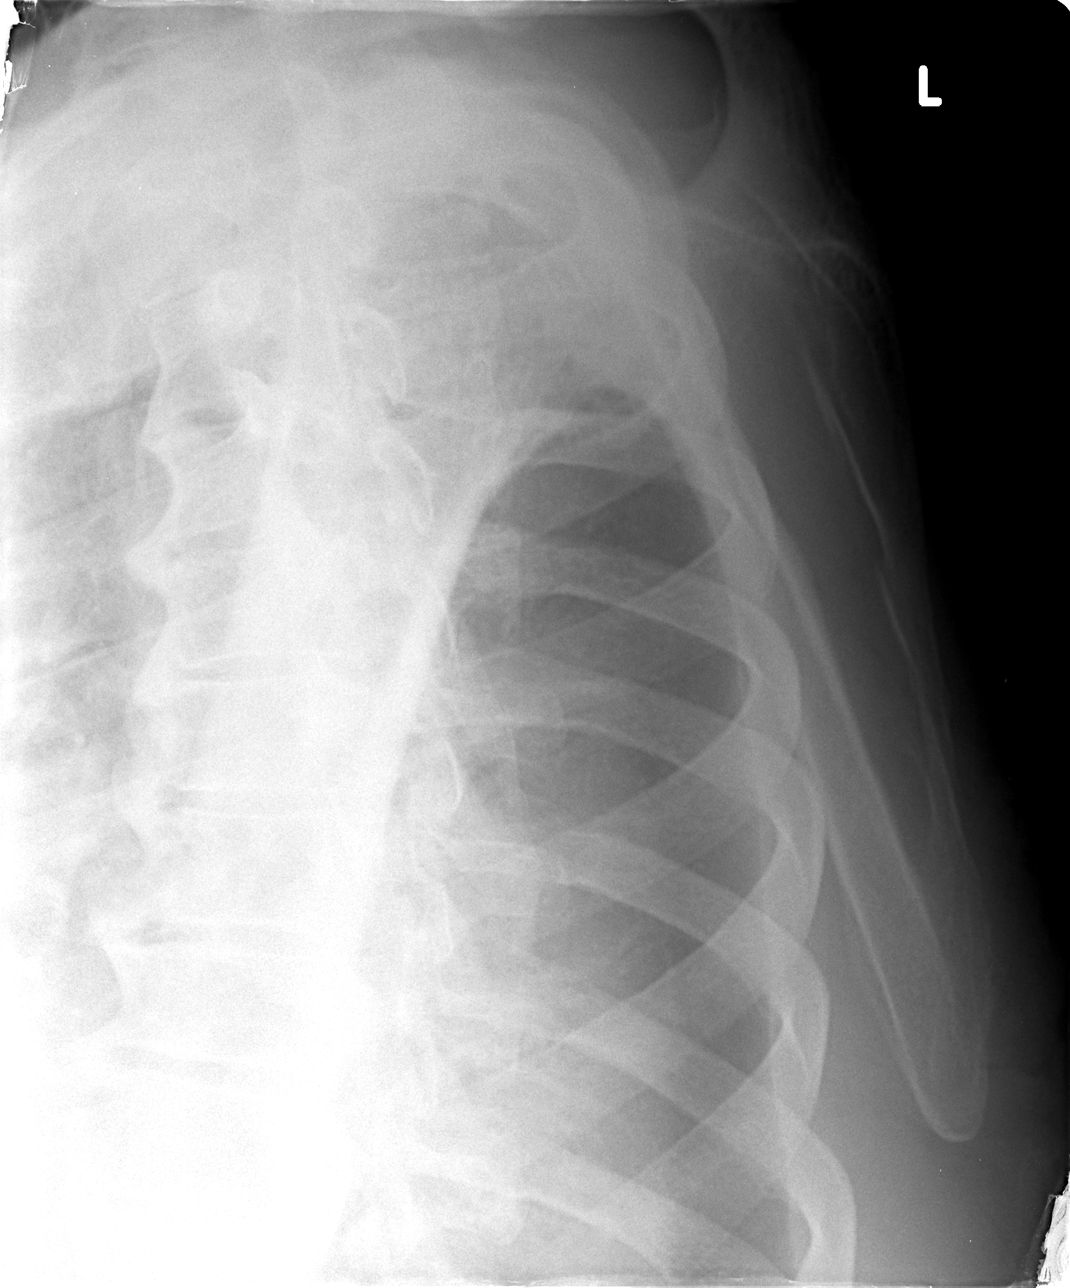

[view not recorded (3 of 4)]
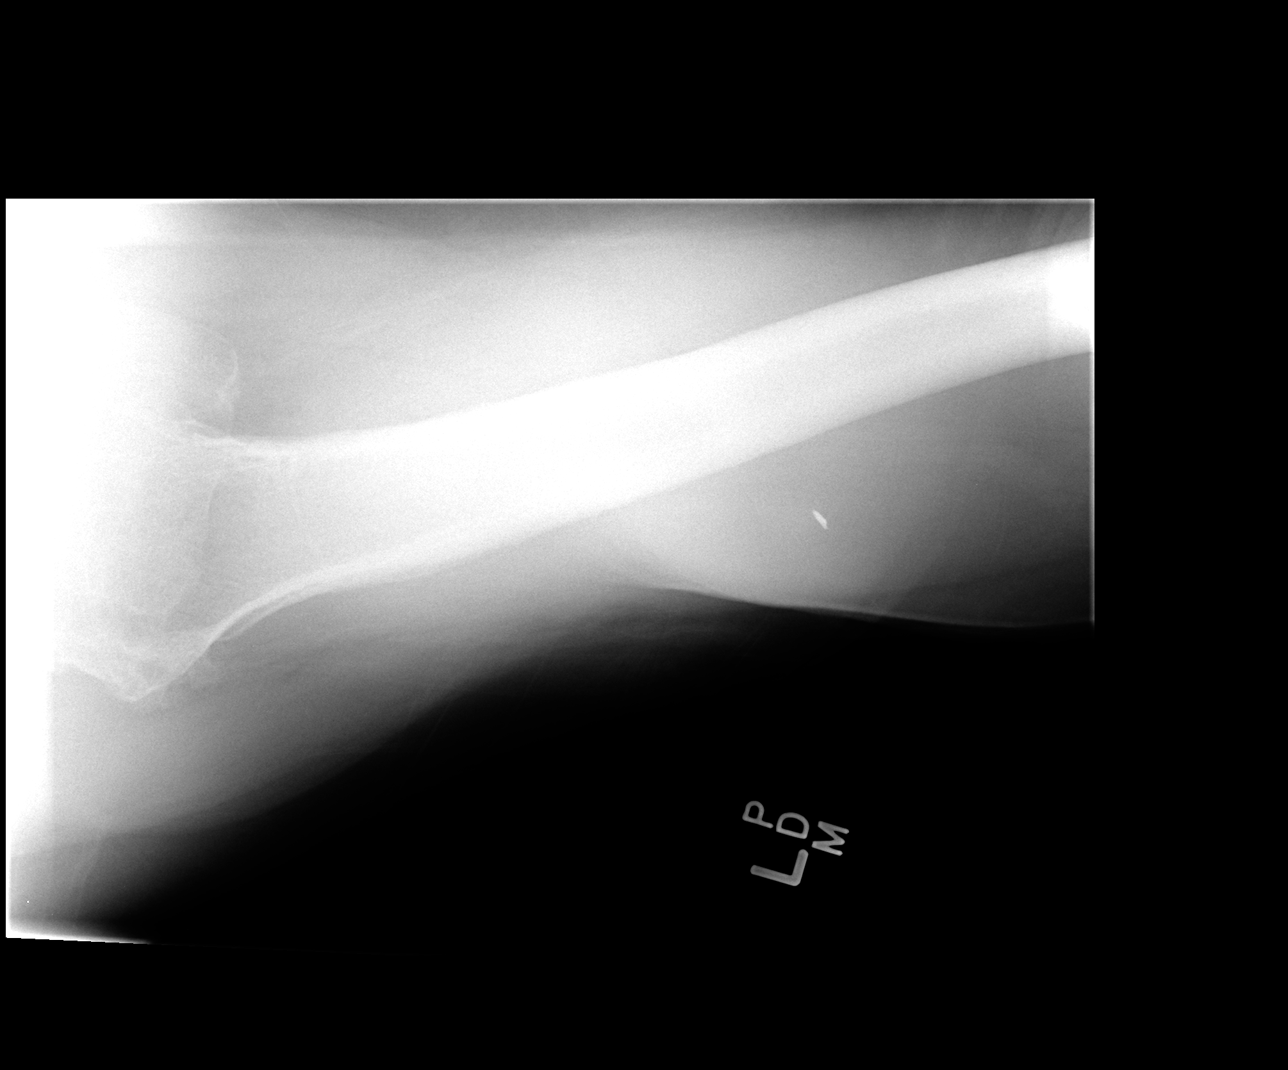

[view not recorded (4 of 4)]
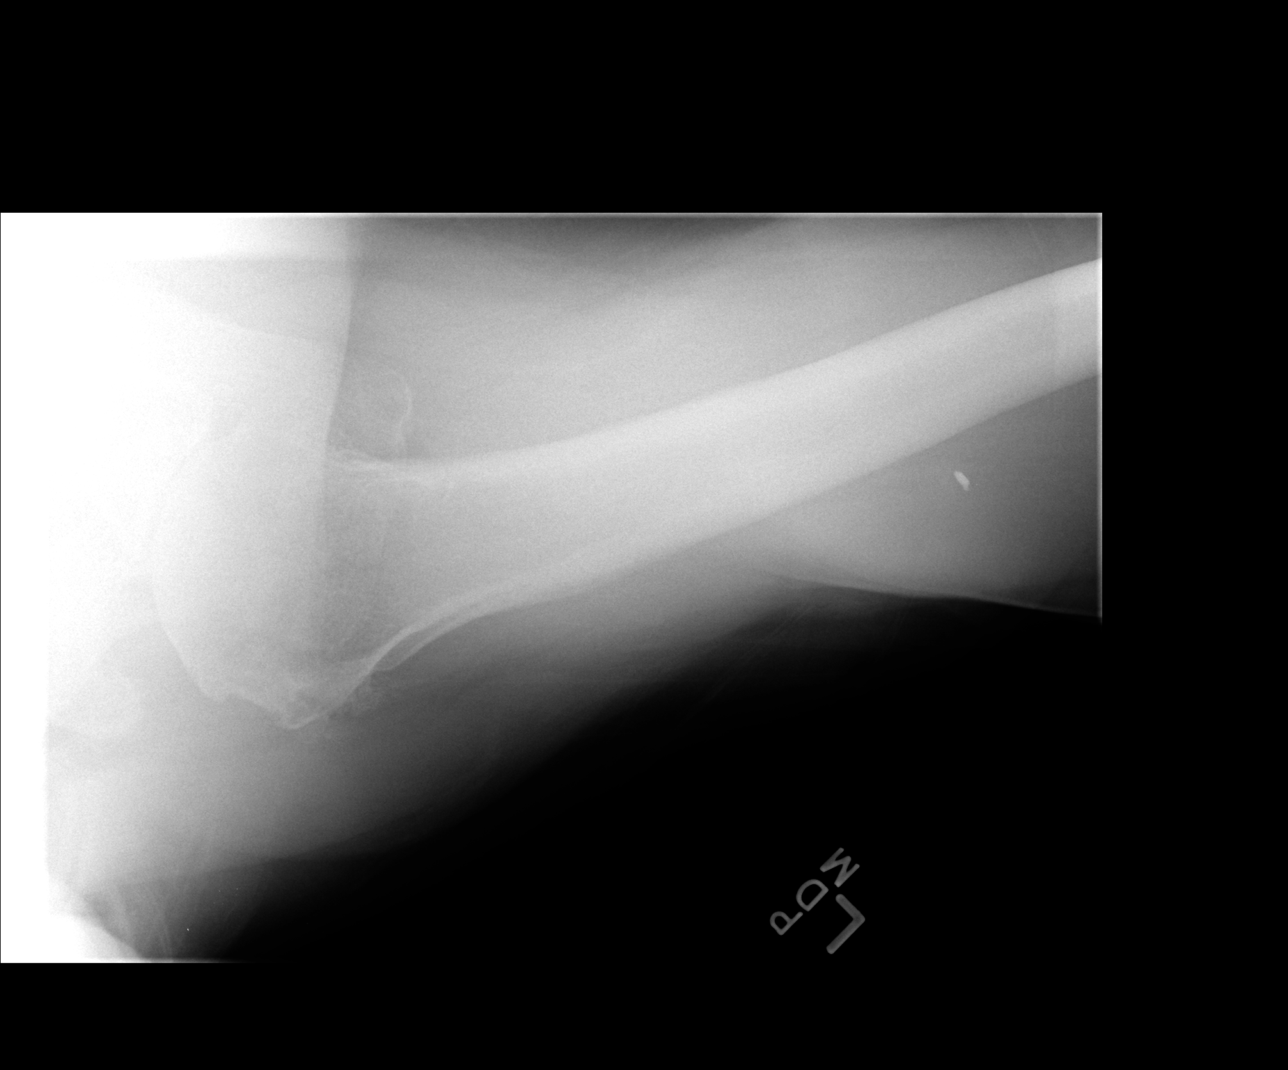

[4 of 4 positions shown; findings below may reference images not displayed]

FINDINGS: There is no evidence of acute fracture or dislocation. Severe joint
space narrowing is appreciated within the glenohumeral joint. There
is periarticular sclerosis and hypertrophic spurring of the humeral
head. Periarticular sclerosis and peripheral hypertrophic spurring
is appreciated within the acromioclavicular joint. The left lung
apex is unremarkable. Hypertrophic spurring is also identified along
the undersurface of the distal clavicle.
IMPRESSION: Osteoarthritic changes without evidence of acute osseous
abnormalities.

## 2015-07-13 IMAGING — CR DG SHOULDER 2+V*R*
3 series · 3 of 3 positions shown · non-contrast
Comparison: None.

CLINICAL DATA: Shoulder pain, limited rotation

EXAM:
RIGHT SHOULDER - 2+ VIEW

[view not recorded (1 of 3)]
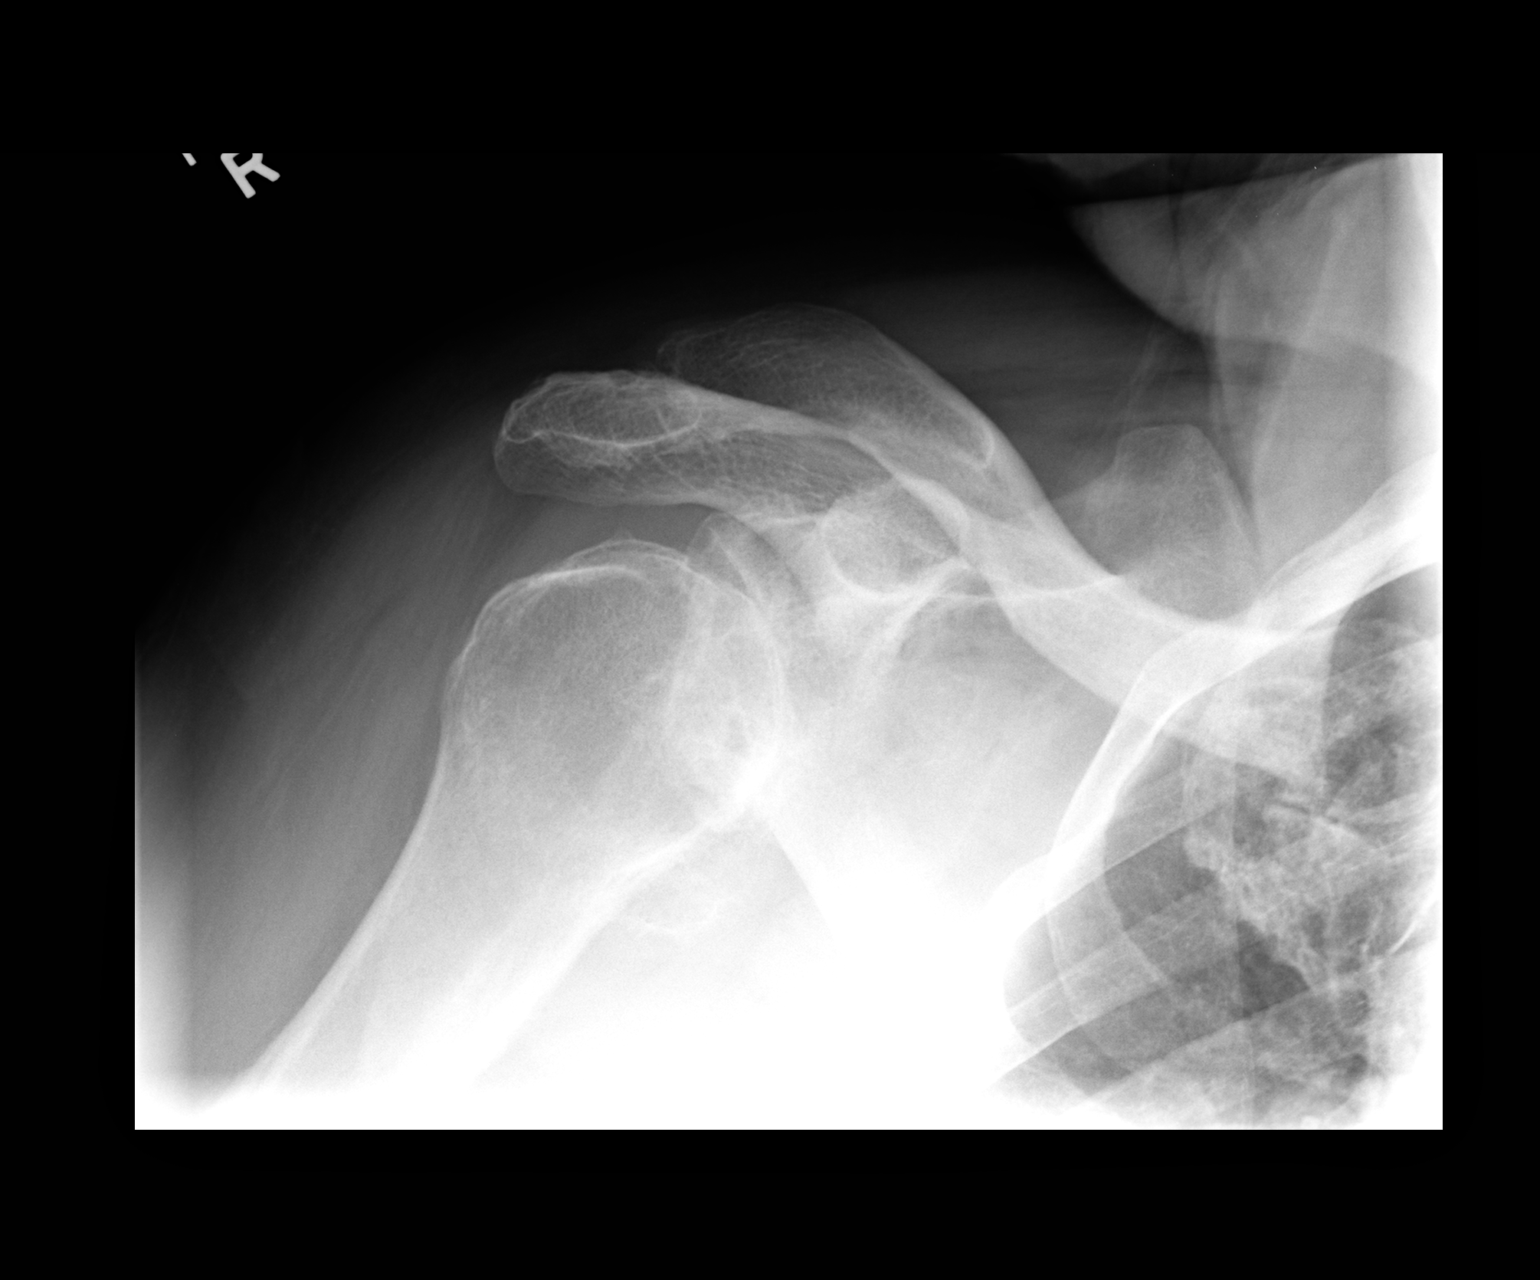

[view not recorded (2 of 3)]
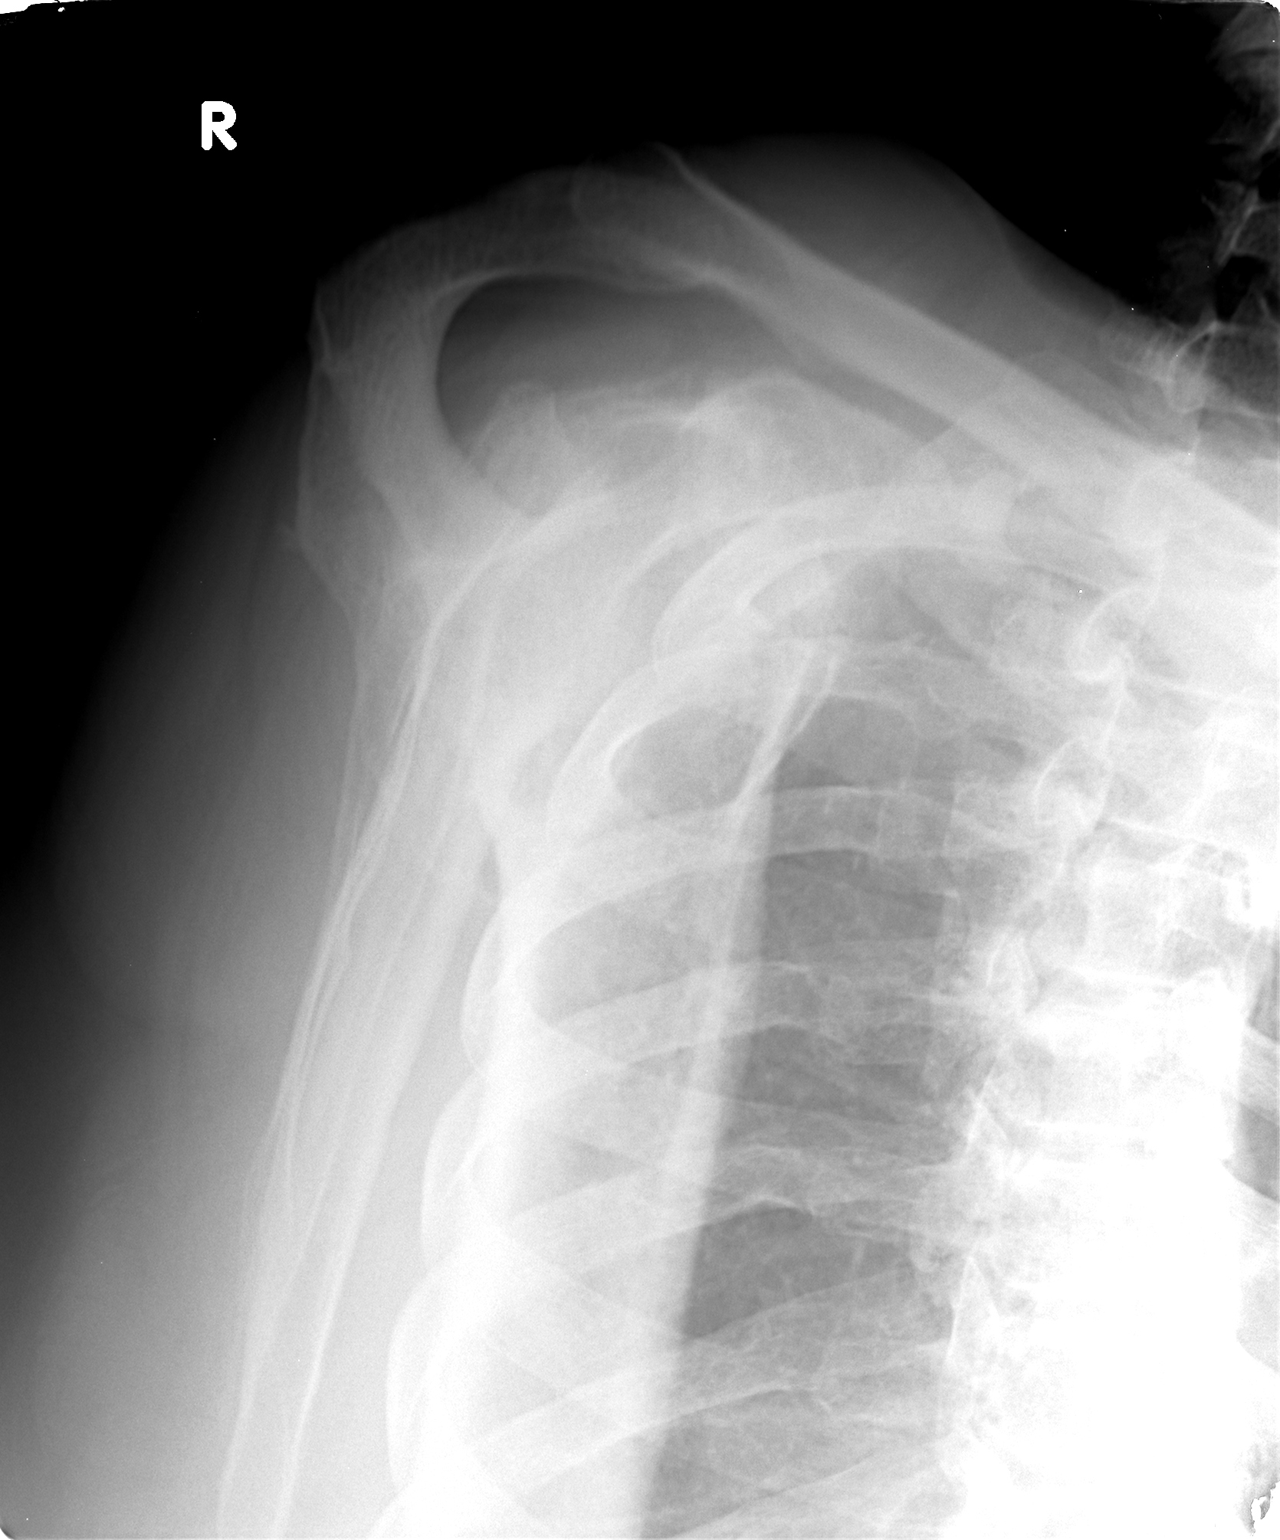

[view not recorded (3 of 3)]
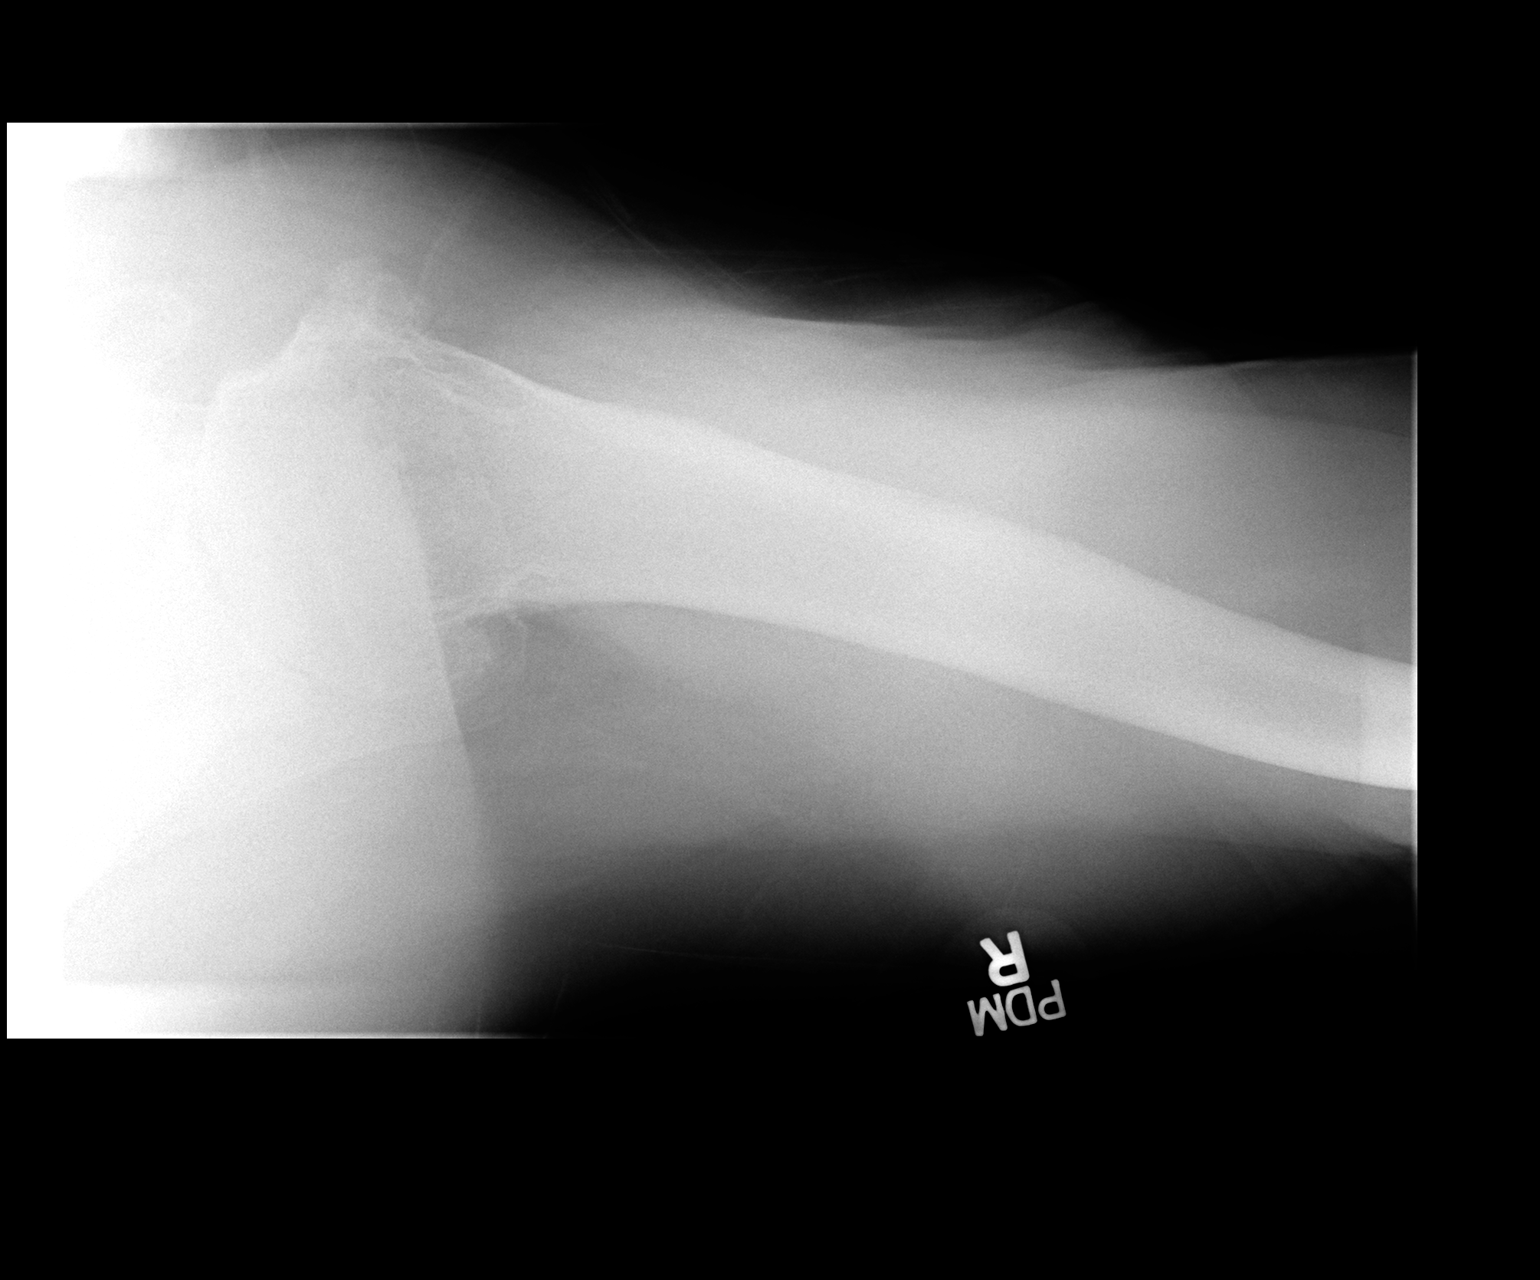

[3 of 3 positions shown; findings below may reference images not displayed]

FINDINGS: Three views of the right shoulder submitted. Degenerative changes
are noted glenohumeral joint. There is narrowing of joint space.
Spurring of humeral head. Moderate degenerative changes AC joint.
Mild glenoid sclerosis. No acute fracture or subluxation.
IMPRESSION: No acute fracture or subluxation. Degenerative changes as described
above.

## 2015-07-22 ENCOUNTER — Ambulatory Visit (AMBULATORY_SURGERY_CENTER): Payer: Self-pay | Admitting: *Deleted

## 2015-07-22 VITALS — Ht 71.0 in | Wt 348.6 lb

## 2015-07-22 DIAGNOSIS — Z86010 Personal history of colonic polyps: Secondary | ICD-10-CM

## 2015-07-22 MED ORDER — SUPREP BOWEL PREP KIT 17.5-3.13-1.6 GM/177ML PO SOLN: 1.0000 | Freq: Once | ORAL | Status: DC

## 2015-07-22 NOTE — Progress Notes (Signed)
Patient denies any allergies to egg or soy products. Patient denies complications with anesthesia/sedation.  Patient denies oxygen use at home and denies diet medications. Emmi instructions for colonoscopy explained but patient refused.   

## 2015-07-23 ENCOUNTER — Encounter (HOSPITAL_COMMUNITY): Payer: Self-pay | Admitting: *Deleted

## 2015-07-29 ENCOUNTER — Encounter (HOSPITAL_COMMUNITY)
Admission: RE | Disposition: A | Discharge status: Home / Self Care | Payer: Self-pay | Source: Ambulatory Visit | Attending: Gastroenterology

## 2015-07-29 ENCOUNTER — Ambulatory Visit (HOSPITAL_COMMUNITY): Payer: Medicare Other | Admitting: Certified Registered"

## 2015-07-29 ENCOUNTER — Ambulatory Visit (HOSPITAL_COMMUNITY)
Admission: RE | Admit: 2015-07-29 | Discharge: 2015-07-29 | Disposition: A | Discharge status: Home / Self Care | Payer: Medicare Other | Source: Ambulatory Visit | Attending: Gastroenterology | Admitting: Gastroenterology

## 2015-07-29 ENCOUNTER — Ambulatory Visit (HOSPITAL_COMMUNITY): Admit: 2015-07-29 | Payer: Self-pay | Admitting: Gastroenterology

## 2015-07-29 ENCOUNTER — Encounter (HOSPITAL_COMMUNITY): Payer: Self-pay | Admitting: *Deleted

## 2015-07-29 ENCOUNTER — Encounter (HOSPITAL_COMMUNITY): Payer: Self-pay

## 2015-07-29 DIAGNOSIS — Z1211 Encounter for screening for malignant neoplasm of colon: Secondary | ICD-10-CM | POA: Insufficient documentation

## 2015-07-29 DIAGNOSIS — F329 Major depressive disorder, single episode, unspecified: Secondary | ICD-10-CM | POA: Insufficient documentation

## 2015-07-29 DIAGNOSIS — E785 Hyperlipidemia, unspecified: Secondary | ICD-10-CM | POA: Insufficient documentation

## 2015-07-29 DIAGNOSIS — M19012 Primary osteoarthritis, left shoulder: Secondary | ICD-10-CM | POA: Diagnosis not present

## 2015-07-29 DIAGNOSIS — Z6841 Body Mass Index (BMI) 40.0 and over, adult: Secondary | ICD-10-CM | POA: Diagnosis not present

## 2015-07-29 DIAGNOSIS — Z87891 Personal history of nicotine dependence: Secondary | ICD-10-CM | POA: Insufficient documentation

## 2015-07-29 DIAGNOSIS — K648 Other hemorrhoids: Secondary | ICD-10-CM | POA: Diagnosis not present

## 2015-07-29 DIAGNOSIS — M19011 Primary osteoarthritis, right shoulder: Secondary | ICD-10-CM | POA: Insufficient documentation

## 2015-07-29 DIAGNOSIS — Z880 Allergy status to penicillin: Secondary | ICD-10-CM | POA: Insufficient documentation

## 2015-07-29 DIAGNOSIS — K5730 Diverticulosis of large intestine without perforation or abscess without bleeding: Secondary | ICD-10-CM

## 2015-07-29 DIAGNOSIS — Z980 Intestinal bypass and anastomosis status: Secondary | ICD-10-CM | POA: Diagnosis not present

## 2015-07-29 DIAGNOSIS — Z8371 Family history of colonic polyps: Secondary | ICD-10-CM | POA: Insufficient documentation

## 2015-07-29 DIAGNOSIS — I10 Essential (primary) hypertension: Secondary | ICD-10-CM | POA: Diagnosis not present

## 2015-07-29 DIAGNOSIS — Z860100 Personal history of colon polyps, unspecified: Secondary | ICD-10-CM | POA: Insufficient documentation

## 2015-07-29 DIAGNOSIS — Z96642 Presence of left artificial hip joint: Secondary | ICD-10-CM | POA: Insufficient documentation

## 2015-07-29 DIAGNOSIS — H9193 Unspecified hearing loss, bilateral: Secondary | ICD-10-CM | POA: Insufficient documentation

## 2015-07-29 DIAGNOSIS — N400 Enlarged prostate without lower urinary tract symptoms: Secondary | ICD-10-CM | POA: Diagnosis not present

## 2015-07-29 HISTORY — PX: COLONOSCOPY WITH PROPOFOL: SHX5780

## 2015-07-29 SURGERY — COLONOSCOPY
Anesthesia: Monitor Anesthesia Care

## 2015-07-29 SURGERY — COLONOSCOPY WITH PROPOFOL
Anesthesia: Monitor Anesthesia Care

## 2015-07-29 MED ORDER — PROPOFOL 10 MG/ML IV BOLUS
INTRAVENOUS | Status: DC | PRN
  Administered 2015-07-29 (×3): 20 mg via INTRAVENOUS

## 2015-07-29 MED ORDER — PROPOFOL 10 MG/ML IV BOLUS
INTRAVENOUS | Status: AC
  Filled 2015-07-29: qty 40

## 2015-07-29 MED ORDER — PROPOFOL 500 MG/50ML IV EMUL
INTRAVENOUS | Status: DC | PRN
Start: 2015-07-29 — End: 2015-07-29
  Administered 2015-07-29: 125 ug/kg/min via INTRAVENOUS

## 2015-07-29 MED ORDER — SODIUM CHLORIDE 0.9 % IV SOLN: INTRAVENOUS | Status: DC

## 2015-07-29 MED ORDER — LIDOCAINE HCL (CARDIAC) 20 MG/ML IV SOLN
INTRAVENOUS | Status: DC | PRN
  Administered 2015-07-29: 40 mg via INTRAVENOUS

## 2015-07-29 MED ORDER — LACTATED RINGERS IV SOLN
INTRAVENOUS | Status: DC
  Administered 2015-07-29: 09:00:00 via INTRAVENOUS

## 2015-07-29 MED ORDER — ONDANSETRON HCL 4 MG/2ML IJ SOLN
INTRAMUSCULAR | Status: DC | PRN
  Administered 2015-07-29: 4 mg via INTRAVENOUS

## 2015-07-29 SURGICAL SUPPLY — 22 items

## 2015-07-29 NOTE — Op Note (Signed)
[facility] Patient Name: Gabriel Love Procedure Date: 07/29/2015 MRN: [id number] Attending MD: Carlota Raspberry. Havery Moros , MD Date of Birth: 10-16-1940 CSN: [id number] Age: 75 Admit Type: Outpatient Procedure:                Colonoscopy Indications:              High risk colon cancer surveillance: Personal                            history of colonic polyps. 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 One of which                            was unable to be cut-through with snare, polyp                            thought to be at surgical anastomosis / staple                            line. Here for follow up exam for another attempt                            at removal of polyp if it remains present. Providers:                Carlota Raspberry. Havery Moros, MD, Malka So, RN,                            William Dalton, Technician Referring MD:              Medicines:                Monitored Anesthesia Care Complications:            No immediate complications. Estimated blood loss:                            None. Estimated Blood Loss:     Estimated blood loss: none. Procedure:                Pre-Anesthesia Assessment:                           - Prior to the procedure, a History and Physical                            was performed, and patient medications and  allergies were reviewed. The patient's tolerance of                            previous anesthesia was also reviewed. The risks                            and benefits of the procedure and the sedation                            options and risks were discussed with the patient.                            All questions were answered, and informed consent                            was obtained.  Prior Anticoagulants: The patient has                            taken aspirin, last dose was 1 day prior to                            procedure. ASA Grade Assessment: III - A patient                            with severe systemic disease. After reviewing the                            risks and benefits, the patient was deemed in                            satisfactory condition to undergo the procedure.                           After obtaining informed consent, the colonoscope                            was passed under direct vision. Throughout the                            procedure, the patient's blood pressure, pulse, and                            oxygen saturations were monitored continuously. The                            [id number]) scope was introduced through                            the anus and advanced to the the ileocolonic                            anastomosis. The colonoscopy was performed without  difficulty. The patient tolerated the procedure                            well. The quality of the bowel preparation was                            adequate. The rectum, surgical anastomosis were                            photographed. Scope In: 9:09:05 AM Scope Out: 9:28:56 AM Scope Withdrawal Time: 0 hours 9 minutes 38 seconds  Total Procedure Duration: 0 hours 19 minutes 51 seconds  Findings:      The perianal and digital rectal examinations were normal.      Multiple medium-mouthed diverticula were found in the left colon.      There was evidence of a prior end-to-side ileo-colonic anastomosis in       the ascending colon. This was patent and was characterized by healthy       appearing mucosa. Several minutes were spent examining the anastomosis       for evidence of residual polyp however none could be seen. The entire       area appeared normal without polypoid tissue. I suspect the prior       polypectomy attempt at the  polyp made it ischemic and it likely sloughed       off. There was no scar or residual polyp appreciated.      Non-bleeding internal hemorrhoids were found during retroflexion.      The exam was otherwise normal throughout the examined colon. No other       colon polyps were appreciated. Impression:               - Diverticulosis in the left colon.                           - Patent end-to-side ileo-colonic anastomosis,                            characterized by healthy appearing mucosa.                           - Non-bleeding internal hemorrhoids.                           - No specimens collected. Moderate Sedation:      N/A- Per Anesthesia Care Recommendation:           - Patient has a contact number available for                            emergencies. The signs and symptoms of potential                            delayed complications were discussed with the                            patient. Return to normal activities tomorrow.  Written discharge instructions were provided to the                            patient.                           - Resume previous diet.                           - Continue present medications.                           - Repeat colonoscopy in 3 years for surveillance                            given multiple adenomas removed on last exam in                            02/2015 and advanced adenoma in 2015 Procedure Code(s):        --- Professional ---                           [phone], Colonoscopy, flexible; diagnostic, including                            collection of specimen(s) by brushing or washing,                            when performed (separate procedure) Diagnosis Code(s):        --- Professional ---                           Z86.010, Personal history of colonic polyps                           K64.8, Other hemorrhoids                           Z98.0, Intestinal bypass and anastomosis status                            K57.30, Diverticulosis of large intestine without                            perforation or abscess without bleeding CPT copyright 2016 American Medical Association. All rights reserved. The codes documented in this report are preliminary and upon coder review may  be revised to meet current compliance requirements. Remo Lipps P. Bonne Whack, MD 07/29/2015 9:40:29 AM This report has been signed electronically. Number of Addenda: 0

## 2015-07-29 NOTE — H&P (Signed)
HPI :  75 y/o male who had ileocectomy for advanced polyp in 2015, with follow up surveillance exam December 2016. There was a roughly 1cm adenomatous polyp noted at the surgical anastomosis however despite grasping with the snare and using cautery it would not cut through, unclear if this was arising over a staple line. Biopsies revealed an adenomatous polyp. Here for a follow up exam for further attempt at polypectomy if the lesion remains present (appeared dusky following prior polypectomy attempts, could have fallen off). No change in his history since that time per his report, he feels well today.   Past Medical History  Diagnosis Date  . Hyperlipidemia     under control  . Prostate hypertrophy     sees Dr. Gaynelle Arabian   . Depression   . Hypertension     "borderline"  . DJD (degenerative joint disease)     shoulders bilateral  . Environmental allergies   . Urinary leakage   . Cataract   . Nonspecific abnormal finding in stool contents   . Other general symptoms(780.99)   . Allergy   . Hearing loss     bilateral hearing aids  . Full dentures   . Blood transfusion without reported diagnosis 12/2006    At [facility] with hip replacement - 2 units transfused     Past Surgical History  Procedure Laterality Date  . Circumcision, non-newborn  1985  . Prostate surgery  03-29-11    had CTT per Dr. Gaynelle Arabian, in office  . Joint replacement  12/2006    total left hip per Dr. Percell Miller   . Laparoscopic partial colectomy N/A 08/07/2013    Procedure: LAPAROSCOPIC right PARTIAL COLECTOMY;  Surgeon: Leighton Ruff, MD;  Location: [facility] ORS;  Service: General;  Laterality: N/A;  . Cataract extraction w/phaco Right 03/25/2014    Procedure: CATARACT EXTRACTION PHACO AND INTRAOCULAR LENS PLACEMENT; CDE:9.76;  Surgeon: Williams Che, MD;  Location: [facility] ORS;  Service: Ophthalmology;  Laterality: Right;  . Cataract extraction w/phaco Left 06/24/2014    Procedure: CATARACT EXTRACTION PHACO AND INTRAOCULAR  LENS PLACEMENT (IOC);  Surgeon: Williams Che, MD;  Location: [facility] ORS;  Service: Ophthalmology;  Laterality: Left;  CDE:8.45  . Colonoscopy  06-19-13, 03/05/15    per Dr. Deatra Ina, mass at the ileocecal valve plus several polyps, pathology benign, repeat in one year, polyp 2016   . Multiple tooth extractions     Family History  Problem Relation Age of Onset  . Hypertension Mother   . Diabetes Mother   . Heart failure Mother     cad/mi with rupture ventricle  . Hyperlipidemia Father   . Colon polyps Father   . Cancer Neg Hx     colon or prostate  . Colon cancer Neg Hx   . Esophageal cancer Neg Hx   . Rectal cancer Neg Hx   . Stomach cancer Neg Hx    Social History  Substance Use Topics  . Smoking status: Former Smoker -- 1.00 packs/day for 5 years    Types: Cigarettes    Quit date: 03/22/1968  . Smokeless tobacco: Current User    Types: Chew     Comment: occassionally  . Alcohol Use: No   Current Facility-Administered Medications  Medication Dose Route Frequency Provider Last Rate Last Dose  . 0.9 %  sodium chloride infusion   Intravenous Continuous Manus Gunning, MD      . lactated ringers infusion   Intravenous Continuous Manus Gunning, MD  Allergies  Allergen Reactions  . Ivp Dye [Iodinated Diagnostic Agents] Anaphylaxis    Iv dye allergy in 1985, anaphylaxix//a.calhoun- "shocked back to life"  . Penicillins Other (See Comments)    Urticaria Has patient had a PCN reaction causing immediate rash, facial/tongue/throat swelling, SOB or lightheadedness with hypotension: no Has patient had a PCN reaction causing severe rash involving mucus membranes or skin necrosis: no Has patient had a PCN reaction that required hospitalization no Has patient had a PCN reaction occurring within the last 10 years: yes If all of the above answers are "NO", then may proceed with Cephalosporin use.    Meds reviewed per Epic outpatient chart  Review of Systems: All  systems reviewed and negative except where noted in HPI.    No results found.  Physical Exam: BP 159/78 mmHg  Pulse 75  Temp(Src) 97.8 F (36.6 C) (Oral)  Resp 13  Ht 5\' 11"  (1.803 m)  Wt 348 lb (157.852 kg)  BMI 48.56 kg/m2  SpO2 93% Constitutional: Pleasant,well-developed, male in no acute distress. HEENT: Normocephalic and atraumatic. Conjunctivae are normal. No scleral icterus. Neck supple.  Cardiovascular: Normal rate, regular rhythm.  Pulmonary/chest: Effort normal and breath sounds normal. No wheezing, rales or rhonchi. Abdominal: Soft, nondistended, nontender. Bowel sounds active throughout. There are no masses palpable. No hepatomegaly.   ASSESSMENT AND PLAN: 75 y/o male with history of colon polyps, here for surveillance exam to re-evaluate adenomatous polyp along surgical anastomosis and remove it possible.   The indications, risks, and benefits of colonoscopy were explained to the patient in detail. Risks include but are not limited to bleeding, perforation, adverse reaction to medications, and cardiopulmonary compromise. Sequelae include but are not limited to the possibility of surgery, hospitalization, and mortality. The patient verbalized understanding and wished to proceed. Further recommendations pending results of the exam.    Cellar, MD [facility] Pager [phone]   No ref. provider found

## 2015-07-29 NOTE — Anesthesia Postprocedure Evaluation (Signed)
Anesthesia Post Note  Patient: Gabriel Love  Procedure(s) Performed: Procedure(s) (LRB): COLONOSCOPY WITH PROPOFOL (N/A) HOT HEMOSTASIS (ARGON PLASMA COAGULATION/BICAP) (N/A)  Patient location during evaluation: PACU Anesthesia Type: MAC Level of consciousness: awake and alert Pain management: pain level controlled Vital Signs Assessment: post-procedure vital signs reviewed and stable Respiratory status: spontaneous breathing, nonlabored ventilation, respiratory function stable and patient connected to nasal cannula oxygen Cardiovascular status: stable and blood pressure returned to baseline Anesthetic complications: no    Last Vitals:  Filed Vitals:   07/29/15 1005 07/29/15 1010  BP:  132/80  Pulse: 68 70  Temp:    Resp: 21 19    Last Pain: There were no vitals filed for this visit.               [name]

## 2015-07-29 NOTE — Transfer of Care (Signed)
Immediate Anesthesia Transfer of Care Note  Patient: Gabriel Love  Procedure(s) Performed: Procedure(s): COLONOSCOPY WITH PROPOFOL (N/A) HOT HEMOSTASIS (ARGON PLASMA COAGULATION/BICAP) (N/A)  Patient Location: PACU and Endoscopy Unit  Anesthesia Type:MAC  Level of Consciousness: awake and alert   Airway & Oxygen Therapy: Patient Spontanous Breathing and Patient connected to face mask oxygen  Post-op Assessment: Report given to RN and Post -op Vital signs reviewed and stable  Post vital signs: Reviewed and stable  Last Vitals:  Filed Vitals:   07/29/15 0816  BP: 159/78  Pulse: 75  Temp: 36.6 C  Resp: 13    Last Pain: There were no vitals filed for this visit.       Complications: No apparent anesthesia complications

## 2015-07-29 NOTE — Interval H&P Note (Signed)
History and Physical Interval Note:  07/29/2015 8:52 AM  Gabriel Love  has presented today for surgery, with the diagnosis of polyp  The various methods of treatment have been discussed with the patient and family. After consideration of risks, benefits and other options for treatment, the patient has consented to  Procedure(s): COLONOSCOPY WITH PROPOFOL (N/A) HOT HEMOSTASIS (ARGON PLASMA COAGULATION/BICAP) (N/A) as a surgical intervention .  The patient's history has been reviewed, patient examined, no change in status, stable for surgery.  I have reviewed the patient's chart and labs.  Questions were answered to the patient's satisfaction.     [name]

## 2015-07-29 NOTE — Discharge Instructions (Signed)
Colonoscopy, Care After These instructions give you information on caring for yourself after your procedure. Your doctor may also give you more specific instructions. Call your doctor if you have any problems or questions after your procedure. HOME CARE  Do not drive for 24 hours.  Do not sign important papers or use machinery for 24 hours.  You may shower.  You may go back to your usual activities, but go slower for the first 24 hours.  Take rest breaks often during the first 24 hours.  Walk around or use warm packs on your belly (abdomen) if you have belly cramping or gas.  Drink enough fluids to keep your pee (urine) clear or pale yellow.  Resume your normal diet. Avoid heavy or fried foods.  Avoid drinking alcohol for 24 hours or as told by your doctor.  Only take medicines as told by your doctor. If a tissue sample (biopsy) was taken during the procedure:   Do not take aspirin or blood thinners for 7 days, or as told by your doctor.  Do not drink alcohol for 7 days, or as told by your doctor.  Eat soft foods for the first 24 hours. GET HELP IF: You still have a small amount of blood in your poop (stool) 2-3 days after the procedure. GET HELP RIGHT AWAY IF:  You have more than a small amount of blood in your poop.  You see clumps of tissue (blood clots) in your poop.  Your belly is puffy (swollen).  You feel sick to your stomach (nauseous) or throw up (vomit).  You have a fever.  You have belly pain that gets worse and medicine does not help. MAKE SURE YOU:  Understand these instructions.  Will watch your condition.  Will get help right away if you are not doing well or get worse.   This information is not intended to replace advice given to you by your health care provider. Make sure you discuss any questions you have with your health care provider.   Document Released: 04/10/2010 Document Revised: 03/13/2013 Document Reviewed: 11/13/2012 Elsevier  Interactive Patient Education 2016 Elsevier Inc.   Colon Polyps Polyps are lumps of extra tissue growing inside the body. Polyps can grow in the large intestine (colon). Most colon polyps are noncancerous (benign). However, some colon polyps can become cancerous over time. Polyps that are larger than a pea may be harmful. To be safe, caregivers remove and test all polyps. CAUSES  Polyps form when mutations in the genes cause your cells to grow and divide even though no more tissue is needed. RISK FACTORS There are a number of risk factors that can increase your chances of getting colon polyps. They include:  Being older than 50 years.  Family history of colon polyps or colon cancer.  Long-term colon diseases, such as colitis or Crohn disease.  Being overweight.  Smoking.  Being inactive.  Drinking too much alcohol. SYMPTOMS  Most small polyps do not cause symptoms. If symptoms are present, they may include:  Blood in the stool. The stool may look dark red or black.  Constipation or diarrhea that lasts longer than 1 week. DIAGNOSIS People often do not know they have polyps until their caregiver finds them during a regular checkup. Your caregiver can use 4 tests to check for polyps:  Digital rectal exam. The caregiver wears gloves and feels inside the rectum. This test would find polyps only in the rectum.  Barium enema. The caregiver puts a liquid called  barium into your rectum before taking X-rays of your colon. Barium makes your colon look white. Polyps are dark, so they are easy to see in the X-ray pictures.  Sigmoidoscopy. A thin, flexible tube (sigmoidoscope) is placed into your rectum. The sigmoidoscope has a light and tiny camera in it. The caregiver uses the sigmoidoscope to look at the last third of your colon.  Colonoscopy. This test is like sigmoidoscopy, but the caregiver looks at the entire colon. This is the most common method for finding and removing  polyps. TREATMENT  Any polyps will be removed during a sigmoidoscopy or colonoscopy. The polyps are then tested for cancer. PREVENTION  To help lower your risk of getting more colon polyps:  Eat plenty of fruits and vegetables. Avoid eating fatty foods.  Do not smoke.  Avoid drinking alcohol.  Exercise every day.  Lose weight if recommended by your caregiver.  Eat plenty of calcium and folate. Foods that are rich in calcium include milk, cheese, and broccoli. Foods that are rich in folate include chickpeas, kidney beans, and spinach. HOME CARE INSTRUCTIONS Keep all follow-up appointments as directed by your caregiver. You may need periodic exams to check for polyps. SEEK MEDICAL CARE IF: You notice bleeding during a bowel movement.   This information is not intended to replace advice given to you by your health care provider. Make sure you discuss any questions you have with your health care provider.   Document Released: 12/03/2003 Document Revised: 03/29/2014 Document Reviewed: 05/18/2011 Elsevier Interactive Patient Education [employer].

## 2015-07-29 NOTE — Anesthesia Preprocedure Evaluation (Signed)
Anesthesia Evaluation  Patient identified by MRN, date of birth, ID band Patient awake    Reviewed: Allergy & Precautions, NPO status , Patient's Chart, lab work & pertinent test results  Airway Mallampati: II  TM Distance: >3 FB Neck ROM: Full    Dental no notable dental hx.    Pulmonary former smoker,    Pulmonary exam normal breath sounds clear to auscultation       Cardiovascular hypertension, Pt. on medications + Peripheral Vascular Disease  Normal cardiovascular exam Rhythm:Regular Rate:Normal     Neuro/Psych PSYCHIATRIC DISORDERS Depression negative neurological ROS     GI/Hepatic negative GI ROS, Neg liver ROS,   Endo/Other  Morbid obesity  Renal/GU negative Renal ROS  negative genitourinary   Musculoskeletal  (+) Arthritis ,   Abdominal (+) + obese,   Peds negative pediatric ROS (+)  Hematology negative hematology ROS (+)   Anesthesia Other Findings   Reproductive/Obstetrics negative OB ROS                             Anesthesia Physical Anesthesia Plan  ASA: III  Anesthesia Plan: MAC   Post-op Pain Management:    Induction: Intravenous  Airway Management Planned: Natural Airway  Additional Equipment:   Intra-op Plan:   Post-operative Plan:   Informed Consent: I have reviewed the patients History and Physical, chart, labs and discussed the procedure including the risks, benefits and alternatives for the proposed anesthesia with the patient or authorized representative who has indicated his/her understanding and acceptance.   Dental advisory given  Plan Discussed with: CRNA  Anesthesia Plan Comments:         Anesthesia Quick Evaluation

## 2015-07-31 ENCOUNTER — Encounter (HOSPITAL_COMMUNITY): Payer: Self-pay | Admitting: Gastroenterology

## 2015-09-25 ENCOUNTER — Telehealth: Payer: Self-pay

## 2015-09-25 NOTE — Telephone Encounter (Signed)
Patient is on the list for [employer] 2017 and may be a good candidate for an AWV in 2017. Please let me know if/when appt is scheduled.   

## 2015-11-19 ENCOUNTER — Ambulatory Visit: Payer: Medicare Other | Admitting: Family Medicine

## 2015-11-19 ENCOUNTER — Ambulatory Visit (INDEPENDENT_AMBULATORY_CARE_PROVIDER_SITE_OTHER): Payer: Medicare Other | Admitting: Family Medicine

## 2015-11-19 ENCOUNTER — Encounter: Payer: Self-pay | Admitting: Family Medicine

## 2015-11-19 VITALS — BP 140/86 | Temp 98.4°F | Ht 71.0 in | Wt 354.0 lb

## 2015-11-19 DIAGNOSIS — H6123 Impacted cerumen, bilateral: Secondary | ICD-10-CM

## 2015-11-19 DIAGNOSIS — Z23 Encounter for immunization: Secondary | ICD-10-CM

## 2015-11-19 NOTE — Progress Notes (Signed)
   Subjective:    Patient ID: Gabriel Love, male    DOB: 1940-09-07, 75 y.o.   MRN: [id number]  HPI Here to have his ears cleaned out. He saw the [facility] clinic last week and they said both ears are full of cerumen. He has trouble hearing but there is no pain.    Review of Systems  Constitutional: Negative.   HENT: Positive for hearing loss. Negative for ear discharge, ear pain, postnasal drip, sinus pressure and tinnitus.   Eyes: Negative.   Respiratory: Negative.        Objective:   Physical Exam  Constitutional: He appears well-developed and well-nourished.  HENT:  Both ear canals are full of cerumen  Neck: No thyromegaly present.  Cardiovascular: Normal rate, regular rhythm, normal heart sounds and intact distal pulses.   Pulmonary/Chest: Effort normal and breath sounds normal.          Assessment & Plan:  Bilateral cerumen impactions. Both were cleared out with a combination of water irrigation and removal with a speculum.  Laurey Morale, MD

## 2015-11-19 NOTE — Progress Notes (Signed)
Pre visit review using our clinic review tool, if applicable. No additional management support is needed unless otherwise documented below in the visit note. 

## 2016-02-08 ENCOUNTER — Other Ambulatory Visit: Payer: Self-pay | Admitting: Family Medicine

## 2016-02-18 ENCOUNTER — Other Ambulatory Visit: Payer: Self-pay | Admitting: Family Medicine

## 2016-02-26 ENCOUNTER — Other Ambulatory Visit (INDEPENDENT_AMBULATORY_CARE_PROVIDER_SITE_OTHER): Payer: Medicare Other

## 2016-02-26 DIAGNOSIS — Z0000 Encounter for general adult medical examination without abnormal findings: Secondary | ICD-10-CM | POA: Diagnosis not present

## 2016-02-26 LAB — CBC WITH DIFFERENTIAL/PLATELET
BASOS ABS: 0 10*3/uL (ref 0.0–0.1)
BASOS PCT: 0.3 % (ref 0.0–3.0)
EOS ABS: 0.2 10*3/uL (ref 0.0–0.7)
Eosinophils Relative: 1.9 % (ref 0.0–5.0)
HEMATOCRIT: 51 % (ref 39.0–52.0)
HEMOGLOBIN: 17.1 g/dL — AB (ref 13.0–17.0)
LYMPHS PCT: 24.8 % (ref 12.0–46.0)
Lymphs Abs: 2.4 10*3/uL (ref 0.7–4.0)
MCHC: 33.5 g/dL (ref 30.0–36.0)
MCV: 88.9 fl (ref 78.0–100.0)
Monocytes Absolute: 0.7 10*3/uL (ref 0.1–1.0)
Monocytes Relative: 7.5 % (ref 3.0–12.0)
Neutro Abs: 6.4 10*3/uL (ref 1.4–7.7)
Neutrophils Relative %: 65.5 % (ref 43.0–77.0)
Platelets: 223 10*3/uL (ref 150.0–400.0)
RBC: 5.74 Mil/uL (ref 4.22–5.81)
RDW: 14.8 % (ref 11.5–15.5)
WBC: 9.8 10*3/uL (ref 4.0–10.5)

## 2016-02-26 LAB — POC URINALSYSI DIPSTICK (AUTOMATED)
BILIRUBIN UA: NEGATIVE
Glucose, UA: NEGATIVE
KETONES UA: NEGATIVE
Nitrite, UA: NEGATIVE
PH UA: 5.5
PROTEIN UA: NEGATIVE
SPEC GRAV UA: 1.025
Urobilinogen, UA: 0.2

## 2016-02-26 LAB — LIPID PANEL
Cholesterol: 149 mg/dL (ref 0–200)
HDL: 38.7 mg/dL — ABNORMAL LOW (ref >39.00)
LDL CALC: 76 mg/dL (ref 0–99)
NONHDL: 110.19
Total CHOL/HDL Ratio: 4
Triglycerides: 169 mg/dL — ABNORMAL HIGH (ref 0.0–149.0)
VLDL: 33.8 mg/dL (ref 0.0–40.0)

## 2016-02-26 LAB — BASIC METABOLIC PANEL
BUN: 26 mg/dL — ABNORMAL HIGH (ref 6–23)
CHLORIDE: 102 meq/L (ref 96–112)
CO2: 31 mEq/L (ref 19–32)
CREATININE: 1.28 mg/dL (ref 0.40–1.50)
Calcium: 9.3 mg/dL (ref 8.4–10.5)
GFR: 57.98 mL/min — AB (ref >60.00)
Glucose, Bld: 87 mg/dL (ref 70–99)
Potassium: 4.8 mEq/L (ref 3.5–5.1)
Sodium: 140 mEq/L (ref 135–145)

## 2016-02-26 LAB — HEPATIC FUNCTION PANEL
ALK PHOS: 84 U/L (ref 39–117)
ALT: 43 U/L (ref 0–53)
AST: 38 U/L — AB (ref 0–37)
Albumin: 4.3 g/dL (ref 3.5–5.2)
BILIRUBIN DIRECT: 0.1 mg/dL (ref 0.0–0.3)
BILIRUBIN TOTAL: 0.6 mg/dL (ref 0.2–1.2)
TOTAL PROTEIN: 7.2 g/dL (ref 6.0–8.3)

## 2016-02-26 LAB — TSH: TSH: 2.84 u[IU]/mL (ref 0.35–4.50)

## 2016-02-26 LAB — PSA: PSA: 1.17 ng/mL (ref 0.10–4.00)

## 2016-03-01 ENCOUNTER — Other Ambulatory Visit: Payer: Self-pay | Admitting: Urology

## 2016-03-02 ENCOUNTER — Encounter: Payer: Self-pay | Admitting: Family Medicine

## 2016-03-02 ENCOUNTER — Ambulatory Visit (INDEPENDENT_AMBULATORY_CARE_PROVIDER_SITE_OTHER): Payer: Medicare Other | Admitting: Family Medicine

## 2016-03-02 VITALS — BP 142/84 | Temp 98.1°F | Ht 71.0 in | Wt 328.0 lb

## 2016-03-02 DIAGNOSIS — Z01818 Encounter for other preprocedural examination: Secondary | ICD-10-CM | POA: Diagnosis not present

## 2016-03-02 NOTE — Progress Notes (Signed)
   Subjective:    Patient ID: Gabriel Love, male    DOB: 01-02-1941, 75 y.o.   MRN: [id number]  HPI 75 yr old male for a preoperative clearance evaluation. He is scheduled for a circumcision revision surgery per Dr. Gaynelle Arabian on 03-26-16. He feels fine in general and he has been on a diet to lose weight. So far he has lost 26 lbs in about 4 weeks.    Review of Systems  Constitutional: Negative.   HENT: Negative.   Eyes: Negative.   Respiratory: Negative.   Cardiovascular: Negative.   Gastrointestinal: Negative.   Genitourinary: Positive for difficulty urinating. Negative for decreased urine volume, discharge, dysuria, enuresis, flank pain, frequency, genital sores, hematuria, penile pain, penile swelling, scrotal swelling, testicular pain and urgency.  Musculoskeletal: Negative.   Skin: Negative.   Neurological: Negative.   Psychiatric/Behavioral: Negative.        Objective:   Physical Exam  Constitutional: He is oriented to person, place, and time. No distress.  Obese   HENT:  Head: Normocephalic and atraumatic.  Right Ear: External ear normal.  Left Ear: External ear normal.  Nose: Nose normal.  Mouth/Throat: Oropharynx is clear and moist. No oropharyngeal exudate.  Eyes: Conjunctivae and EOM are normal. Pupils are equal, round, and reactive to light. Right eye exhibits no discharge. Left eye exhibits no discharge. No scleral icterus.  Neck: Neck supple. No JVD present. No tracheal deviation present. No thyromegaly present.  Cardiovascular: Normal rate, regular rhythm, normal heart sounds and intact distal pulses.  Exam reveals no gallop and no friction rub.   No murmur heard. EKG is within normal limits   Pulmonary/Chest: Effort normal and breath sounds normal. No respiratory distress. He has no wheezes. He has no rales. He exhibits no tenderness.  Abdominal: Soft. Bowel sounds are normal. He exhibits no distension. There is no tenderness. There is no rebound and no guarding.   Moderate sized non-tender easily reducible ventral hernia superior to the umbilicus   Genitourinary: Rectum normal, prostate normal and penis normal. Rectal exam shows guaiac negative stool. No penile tenderness.  Musculoskeletal: Normal range of motion. He exhibits no edema or tenderness.  Lymphadenopathy:    He has no cervical adenopathy.  Neurological: He is alert and oriented to person, place, and time. He has normal reflexes. No cranial nerve deficit. He exhibits normal muscle tone. Coordination normal.  Skin: Skin is warm and dry. No rash noted. He is not diaphoretic. No erythema. No pallor.  Psychiatric: He has a normal mood and affect. His behavior is normal. Judgment and thought content normal.          Assessment & Plan:  Preoperative evaluation. He is cleared for the surgery mentioned above. I encouraged him to continue with his efforts at weight loss. We discussed exercise strategies. His hernia is asymptomatic and we will observe for now.  Laurey Morale, MD

## 2016-03-02 NOTE — Progress Notes (Signed)
Pt is being scheduled for preop appt; please place surgical orders in epic. Thanks.  

## 2016-03-02 NOTE — Progress Notes (Signed)
Pre visit review using our clinic review tool, if applicable. No additional management support is needed unless otherwise documented below in the visit note. 

## 2016-03-19 ENCOUNTER — Encounter (HOSPITAL_COMMUNITY)
Admission: RE | Admit: 2016-03-19 | Discharge: 2016-03-19 | Disposition: A | Discharge status: Home / Self Care | Payer: Medicare Other | Source: Ambulatory Visit | Attending: Urology | Admitting: Urology

## 2016-03-19 ENCOUNTER — Encounter (HOSPITAL_COMMUNITY): Payer: Self-pay

## 2016-03-19 DIAGNOSIS — Z01812 Encounter for preprocedural laboratory examination: Secondary | ICD-10-CM | POA: Diagnosis not present

## 2016-03-19 DIAGNOSIS — N471 Phimosis: Secondary | ICD-10-CM | POA: Diagnosis not present

## 2016-03-19 HISTORY — DX: Unspecified abdominal hernia without obstruction or gangrene: K46.9

## 2016-03-19 HISTORY — DX: Malignant (primary) neoplasm, unspecified: C80.1

## 2016-03-19 HISTORY — DX: Personal history of urinary (tract) infections: Z87.440

## 2016-03-19 LAB — BASIC METABOLIC PANEL
Anion gap: 7 (ref 5–15)
BUN: 25 mg/dL — AB (ref 6–20)
CHLORIDE: 103 mmol/L (ref 101–111)
CO2: 30 mmol/L (ref 22–32)
Calcium: 8.2 mg/dL — ABNORMAL LOW (ref 8.9–10.3)
Creatinine, Ser: 0.72 mg/dL (ref 0.61–1.24)
GFR calc Af Amer: >60 mL/min (ref >60)
GFR calc non Af Amer: >60 mL/min (ref >60)
GLUCOSE: 111 mg/dL — AB (ref 65–99)
POTASSIUM: 3.9 mmol/L (ref 3.5–5.1)
Sodium: 140 mmol/L (ref 135–145)

## 2016-03-19 NOTE — Progress Notes (Signed)
BMP results in epic per PAT visit 03/19/2016 sent to Dr Gaynelle Arabian

## 2016-03-19 NOTE — Patient Instructions (Signed)
Gabriel Love  03/19/2016   Your procedure is scheduled on: Friday March 26, 2016  Report to [facility]  Entrance take [place]  elevators to 3rd floor to  [place] at 10:00 AM.  Call this number if you have problems the morning of surgery [phone]   Remember: ONLY 1 PERSON MAY GO WITH YOU TO SHORT STAY TO GET  READY MORNING OF [employer].  Do not eat food or drink liquids :After Midnight.     Take these medicines the morning of surgery: May use albuterol inhaler if needed; with small sip of water take your Lexapro                                 You may not have any metal on your body including hair pins and              piercings  Do not wear jewelry,  lotions, powders or colognes, deodorant                    Men may shave face and neck.   Do not bring valuables to the hospital. [place].  Contacts, dentures or bridgework may not be worn into surgery.       Patients discharged the day of surgery will not be allowed to drive home.  Name and phone number of your driver:Gabriel Love (wife)  _____________________________________________________________________             [facility] - Preparing for Surgery Before surgery, you can play an important role.  Because skin is not sterile, your skin needs to be as free of germs as possible.  You can reduce the number of germs on your skin by washing with CHG (chlorahexidine gluconate) soap before surgery.  CHG is an antiseptic cleaner which kills germs and bonds with the skin to continue killing germs even after washing. Please DO NOT use if you have an allergy to CHG or antibacterial soaps.  If your skin becomes reddened/irritated stop using the CHG and inform your nurse when you arrive at Short Stay. Do not shave (including legs and underarms) for at least 48 hours prior to the first CHG shower.  You may shave your face/neck. Please follow  these instructions carefully:  1.  Shower with CHG Soap the night before surgery and the  morning of Surgery.  2.  If you choose to wash your hair, wash your hair first as usual with your  normal  shampoo.  3.  After you shampoo, rinse your hair and body thoroughly to remove the  shampoo.                           4.  Use CHG as you would any other liquid soap.  You can apply chg directly  to the skin and wash                       Gently with a scrungie or clean washcloth.  5.  Apply the CHG Soap to your body ONLY FROM THE NECK DOWN.   Do not use on face/ open  Wound or open sores. Avoid contact with eyes, ears mouth and genitals (private parts).                       Wash face,  Genitals (private parts) with your normal soap.             6.  Wash thoroughly, paying special attention to the area where your surgery  will be performed.  7.  Thoroughly rinse your body with warm water from the neck down.  8.  DO NOT shower/wash with your normal soap after using and rinsing off  the CHG Soap.                9.  Pat yourself dry with a clean towel.            10.  Wear clean pajamas.            11.  Place clean sheets on your bed the night of your first shower and do not  sleep with pets. Day of Surgery : Do not apply any lotions/deodorants the morning of surgery.  Please wear clean clothes to the hospital/surgery center.  FAILURE TO FOLLOW THESE INSTRUCTIONS MAY RESULT IN THE CANCELLATION OF YOUR SURGERY PATIENT SIGNATURE_________________________________  NURSE SIGNATURE__________________________________  ________________________________________________________________________

## 2016-03-19 NOTE — Progress Notes (Signed)
Clearance note per chart per Dr Sarajane Jews

## 2016-03-19 NOTE — Progress Notes (Signed)
Your patient has screened at an elevated risk for Obstructive Sleep Apnea using the Stop-Bang Tool during a pre-surgical visit. Pt scored at high risk.

## 2016-03-26 ENCOUNTER — Encounter (HOSPITAL_COMMUNITY): Payer: Self-pay | Admitting: *Deleted

## 2016-03-26 ENCOUNTER — Ambulatory Visit (HOSPITAL_COMMUNITY)
Admission: RE | Admit: 2016-03-26 | Discharge: 2016-03-26 | Disposition: A | Discharge status: Home / Self Care | Payer: Medicare Other | Source: Ambulatory Visit | Attending: Urology | Admitting: Urology

## 2016-03-26 ENCOUNTER — Encounter (HOSPITAL_COMMUNITY)
Admission: RE | Disposition: A | Discharge status: Home / Self Care | Payer: Self-pay | Source: Ambulatory Visit | Attending: Urology

## 2016-03-26 ENCOUNTER — Ambulatory Visit (HOSPITAL_COMMUNITY): Payer: Medicare Other | Admitting: Certified Registered"

## 2016-03-26 DIAGNOSIS — N471 Phimosis: Secondary | ICD-10-CM | POA: Diagnosis present

## 2016-03-26 DIAGNOSIS — Z7982 Long term (current) use of aspirin: Secondary | ICD-10-CM | POA: Insufficient documentation

## 2016-03-26 DIAGNOSIS — IMO0001 Reserved for inherently not codable concepts without codable children: Secondary | ICD-10-CM

## 2016-03-26 DIAGNOSIS — Z87891 Personal history of nicotine dependence: Secondary | ICD-10-CM | POA: Insufficient documentation

## 2016-03-26 DIAGNOSIS — N401 Enlarged prostate with lower urinary tract symptoms: Secondary | ICD-10-CM | POA: Diagnosis not present

## 2016-03-26 HISTORY — PX: CIRCUMCISION: SHX1350

## 2016-03-26 SURGERY — CIRCUMCISION, ADULT
Anesthesia: General

## 2016-03-26 MED ORDER — CIPROFLOXACIN IN D5W 400 MG/200ML IV SOLN
400.0000 mg | INTRAVENOUS | Status: AC
  Administered 2016-03-26: 400 mg via INTRAVENOUS

## 2016-03-26 MED ORDER — FENTANYL CITRATE (PF) 250 MCG/5ML IJ SOLN
INTRAMUSCULAR | Status: DC | PRN
  Administered 2016-03-26 (×2): 50 ug via INTRAVENOUS

## 2016-03-26 MED ORDER — PROMETHAZINE HCL 25 MG/ML IJ SOLN: 6.2500 mg | INTRAMUSCULAR | Status: DC | PRN

## 2016-03-26 MED ORDER — ONDANSETRON HCL 4 MG/2ML IJ SOLN
INTRAMUSCULAR | Status: AC
  Filled 2016-03-26: qty 2

## 2016-03-26 MED ORDER — HYDROMORPHONE HCL 1 MG/ML IJ SOLN: 0.2500 mg | INTRAMUSCULAR | Status: DC | PRN

## 2016-03-26 MED ORDER — BACITRACIN ZINC 500 UNIT/GM EX OINT
TOPICAL_OINTMENT | CUTANEOUS | Status: DC | PRN
  Administered 2016-03-26: 1 via TOPICAL

## 2016-03-26 MED ORDER — FENTANYL CITRATE (PF) 250 MCG/5ML IJ SOLN
INTRAMUSCULAR | Status: AC
  Filled 2016-03-26: qty 5

## 2016-03-26 MED ORDER — CIPROFLOXACIN IN D5W 400 MG/200ML IV SOLN
INTRAVENOUS | Status: AC
Start: 2016-03-26 — End: 2016-03-26
  Filled 2016-03-26: qty 200

## 2016-03-26 MED ORDER — DEXAMETHASONE SODIUM PHOSPHATE 10 MG/ML IJ SOLN
INTRAMUSCULAR | Status: AC
  Filled 2016-03-26: qty 1

## 2016-03-26 MED ORDER — BUPIVACAINE HCL (PF) 0.5 % IJ SOLN
INTRAMUSCULAR | Status: DC | PRN
  Administered 2016-03-26: 15 mL

## 2016-03-26 MED ORDER — SUCCINYLCHOLINE CHLORIDE 20 MG/ML IJ SOLN
INTRAMUSCULAR | Status: DC | PRN
  Administered 2016-03-26: 100 mg via INTRAVENOUS

## 2016-03-26 MED ORDER — TRAMADOL-ACETAMINOPHEN 37.5-325 MG PO TABS: 1.0000 | ORAL_TABLET | Freq: Four times a day (QID) | ORAL | 0 refills | Status: DC | PRN

## 2016-03-26 MED ORDER — LIDOCAINE HCL (CARDIAC) 20 MG/ML IV SOLN
INTRAVENOUS | Status: DC | PRN
  Administered 2016-03-26: 100 mg via INTRATRACHEAL

## 2016-03-26 MED ORDER — PROPOFOL 10 MG/ML IV BOLUS
INTRAVENOUS | Status: AC
  Filled 2016-03-26: qty 40

## 2016-03-26 MED ORDER — DEXAMETHASONE SODIUM PHOSPHATE 10 MG/ML IJ SOLN
INTRAMUSCULAR | Status: DC | PRN
  Administered 2016-03-26: 10 mg via INTRAVENOUS

## 2016-03-26 MED ORDER — ONDANSETRON HCL 4 MG/2ML IJ SOLN
INTRAMUSCULAR | Status: DC | PRN
  Administered 2016-03-26: 4 mg via INTRAVENOUS

## 2016-03-26 MED ORDER — KETOROLAC TROMETHAMINE 30 MG/ML IJ SOLN
INTRAMUSCULAR | Status: AC
  Filled 2016-03-26: qty 1

## 2016-03-26 MED ORDER — MIDAZOLAM HCL 2 MG/2ML IJ SOLN
INTRAMUSCULAR | Status: DC | PRN
  Administered 2016-03-26: 2 mg via INTRAVENOUS

## 2016-03-26 MED ORDER — BACITRACIN ZINC 500 UNIT/GM EX OINT
TOPICAL_OINTMENT | CUTANEOUS | Status: AC
  Filled 2016-03-26: qty 28.35

## 2016-03-26 MED ORDER — MIDAZOLAM HCL 2 MG/2ML IJ SOLN
INTRAMUSCULAR | Status: AC
  Filled 2016-03-26: qty 2

## 2016-03-26 MED ORDER — LACTATED RINGERS IV SOLN
INTRAVENOUS | Status: DC
  Administered 2016-03-26: 11:00:00 via INTRAVENOUS

## 2016-03-26 MED ORDER — BUPIVACAINE HCL (PF) 0.5 % IJ SOLN
INTRAMUSCULAR | Status: AC
  Filled 2016-03-26: qty 30

## 2016-03-26 MED ORDER — PROPOFOL 10 MG/ML IV BOLUS
INTRAVENOUS | Status: DC | PRN
  Administered 2016-03-26: 200 mg via INTRAVENOUS
  Administered 2016-03-26: 60 mg via INTRAVENOUS

## 2016-03-26 MED ORDER — KETOROLAC TROMETHAMINE 30 MG/ML IJ SOLN
INTRAMUSCULAR | Status: DC | PRN
  Administered 2016-03-26: 30 mg via INTRAVENOUS

## 2016-03-26 SURGICAL SUPPLY — 31 items
ADH SKN CLS APL DERMABOND .7 (GAUZE/BANDAGES/DRESSINGS)
BANDAGE COBAN STERILE 2 (GAUZE/BANDAGES/DRESSINGS) IMPLANT
BLADE SURG 15 STRL LF DISP TIS (BLADE) ×1 IMPLANT
BLADE SURG 15 STRL SS (BLADE) ×3
BNDG COHESIVE 1X5 TAN STRL LF (GAUZE/BANDAGES/DRESSINGS) ×2 IMPLANT
BRIEF STRETCH FOR OB PAD LRG (UNDERPADS AND DIAPERS) ×3 IMPLANT
COVER SURGICAL LIGHT HANDLE (MISCELLANEOUS) ×1 IMPLANT
DECANTER SPIKE VIAL GLASS SM (MISCELLANEOUS) IMPLANT
DERMABOND ADVANCED (GAUZE/BANDAGES/DRESSINGS)
DERMABOND ADVANCED .7 DNX12 (GAUZE/BANDAGES/DRESSINGS) ×1 IMPLANT
DRAPE LAPAROTOMY T 102X78X121 (DRAPES) ×1 IMPLANT
DRAPE LAPAROTOMY T 98X78 PEDS (DRAPES) ×2 IMPLANT
ELECT NDL TIP 2.8 STRL (NEEDLE) ×1 IMPLANT
ELECT NEEDLE TIP 2.8 STRL (NEEDLE) ×3 IMPLANT
ELECT PENCIL ROCKER SW 15FT (MISCELLANEOUS) ×3 IMPLANT
ELECT REM PT RETURN 9FT ADLT (ELECTROSURGICAL) ×3
ELECTRODE REM PT RTRN 9FT ADLT (ELECTROSURGICAL) ×1 IMPLANT
GAUZE PETROLATUM 1 X8 (GAUZE/BANDAGES/DRESSINGS) ×2 IMPLANT
GAUZE SPONGE 4X4 12PLY STRL (GAUZE/BANDAGES/DRESSINGS) ×3 IMPLANT
GAUZE SPONGE 4X4 16PLY XRAY LF (GAUZE/BANDAGES/DRESSINGS) ×3 IMPLANT
GLOVE BIOGEL M STRL SZ7.5 (GLOVE) ×3 IMPLANT
GOWN STRL REUS W/TWL XL LVL3 (GOWN DISPOSABLE) ×3 IMPLANT
KIT BASIN OR (CUSTOM PROCEDURE TRAY) ×3 IMPLANT
NDL HYPO 25X1 1.5 SAFETY (NEEDLE) ×1 IMPLANT
NEEDLE HYPO 25X1 1.5 SAFETY (NEEDLE) ×3 IMPLANT
NS IRRIG 1000ML POUR BTL (IV SOLUTION) ×1 IMPLANT
PACK BASIC VI WITH GOWN DISP (CUSTOM PROCEDURE TRAY) ×3 IMPLANT
SCRUB TECHNI CARE 4 OZ NO DYE (MISCELLANEOUS) ×1 IMPLANT
SUT MNCRL AB 4-0 PS2 18 (SUTURE) ×9 IMPLANT
SYR CONTROL 10ML LL (SYRINGE) ×3 IMPLANT
TOWEL OR 17X26 10 PK STRL BLUE (TOWEL DISPOSABLE) ×2 IMPLANT

## 2016-03-26 NOTE — Transfer of Care (Signed)
Immediate Anesthesia Transfer of Care Note  Patient: Gabriel Love  Procedure(s) Performed: Procedure(s): CIRCUMCISION ADULT (N/A)  Patient Location: PACU  Anesthesia Type:General  Level of Consciousness: alert  and patient cooperative  Airway & Oxygen Therapy: Patient connected to face mask oxygen  Post-op Assessment: Post -op Vital signs reviewed and stable and Patient moving all extremities X 4  Post vital signs: stable  Last Vitals:  Vitals:   03/26/16 0945  BP: (!) 165/70  Pulse: 80  Resp: 18  Temp: 36.5 C    Last Pain:  Vitals:   03/26/16 0945  TempSrc: Oral      Patients Stated Pain Goal: 4 ([id number])  Complications: No apparent anesthesia complications

## 2016-03-26 NOTE — Anesthesia Postprocedure Evaluation (Signed)
Anesthesia Post Note  Patient: Gabriel Love  Procedure(s) Performed: Procedure(s) (LRB): CIRCUMCISION ADULT (N/A)  Patient location during evaluation: PACU Anesthesia Type: General Level of consciousness: sedated Pain management: pain level controlled Vital Signs Assessment: post-procedure vital signs reviewed and stable Respiratory status: spontaneous breathing and respiratory function stable Cardiovascular status: stable Anesthetic complications: no       Last Vitals:  Vitals:   03/26/16 1430 03/26/16 1443  BP: 140/83 (!) 158/69  Pulse: 70 66  Resp: 19 18  Temp:  36.6 C    Last Pain:  Vitals:   03/26/16 1430  TempSrc:   PainSc: 0-No pain                 Alayzha An DANIEL

## 2016-03-26 NOTE — H&P (Signed)
Office Visit Report     02/10/2016   --------------------------------------------------------------------------------   Gabriel Love  MRN: [id number]  PRIMARY CARE:  Ishmael Holter. Sarajane Jews, MD  DOB: 1940-09-29, 76 year old Male  REFERRING:  Adella Hare, MD  SSN: [id number]  PROVIDER:  Carolan Clines, M.D.    LOCATION:  [facility] [phone]   --------------------------------------------------------------------------------   CC: I have irritation and discomfort of the head of my penis.  HPI: Gabriel Love is a 76 year-old male established patient who is here for irritation and discomfort of the head of his penis.  His symptoms did begin gradually. His symptoms have been worse over the last year.   He was not circumcised at birth. He would like to be circumcised. He does have difficulty retracting his foreskin. He has had to use creams on the head of his penis. He does not have diabetes.   He is currently having trouble urinating.   He is s/p elective circumcision many years ago. the patient now has worsening ability to void secondary phimosis. He has been treating himself with steroid cream, but still has significant phimosis. Inflammation is somewhat better, however.     CC: I have an enlarged prostate (S/P Surgery).  HPI: He has had a TUMT and Urolift for treatment of his lower urinary tract symptoms due to his BPH. It was performed 03/29/2011. He has been treated with Flomax and Proscar. He is not on new medications for symptoms of prostate enlargement.   He does have a good size and strength to his urinary stream. He does not have hesitancy or straining. He is having problems with emptying his bladder well.     QOL Score: He would feel delighted if he had to live with his urinary condition the way it is now for the rest of his life.   Calculated QOL Symptom Score: 0    ALLERGIES: Contrast Media Ready-Box MISC Penicillins    MEDICATIONS: Aspirin 81 MG Oral  Tablet Delayed Release Oral  CeleBREX CAPS Oral  Clobetasol Propionate 0.05 % cream Apply thin layer to affected area twice daily for two weeks.  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 BPH w/LUTS, Benign localized hyperplasia of prostate with urinary obstruction - 04/25/2015 Obstructive and reflux uropathy, Unspec, Obstructive uropathy - 04/25/2015 Urinary Tract Inf, Unspec site, Urinary tract infection due to Proteus - 2015, Urinary tract infection, - 2015, Pyuria, - 2015 Urge incontinence, Urge incontinence of urine - 2015 Acute Cystitis, Acute cystitis without hematuria - 2015 Orchitis, Orchitis - 2014 Other microscopic hematuria, Microscopic Hematuria - 2014 Personal Hx Oth male genital organs diseases, History of balanitis - 2014      PMH Notes:  2008-02-20 16:53:32 - Note: Neoplasm Of The Prostate Gland   NON-GU PMH: Encounter for general adult medical examination without abnormal findings, Encounter for preventive health examination - 04/25/2015 Proteus (mirabilis) (morganii) as the cause of diseases classified elsewhere, Bacterial infection due to Proteus mirabilis - 2015 Personal history of other diseases of the circulatory system, History of hypertension - 2014    FAMILY HISTORY: Acute Myocardial Infarction -  Mother Death In The Family Father - Runs In Family Death In The Family Mother - Runs In Family pneumonia - Father   SOCIAL HISTORY: Marital Status: Married Current Smoking Status: Patient does not smoke anymore.  Has never drank.  Drinks 3 caffeinated drinks per day.    REVIEW OF SYSTEMS:    GU Review Male:    Patient reports frequent urination, hard to postpone urination, get up at night to urinate, and leakage of urine. Patient denies burning/ pain with urination, stream starts and stops, trouble starting your stream, have to strain to urinate , erection problems, and penile pain.  Gastrointestinal (Upper):   Patient denies nausea, vomiting, and indigestion/ heartburn.  Gastrointestinal (Lower):   Patient denies diarrhea and constipation.  Constitutional:   Patient denies fever, night sweats, weight loss, and fatigue.  Skin:   Patient denies skin rash/ lesion and itching.  Eyes:   Patient denies blurred vision and double vision.  Ears/ Nose/ Throat:   Patient denies sore throat and sinus problems.  Hematologic/Lymphatic:   Patient denies swollen glands and easy bruising.  Cardiovascular:   Patient denies chest pains and leg swelling.  Respiratory:   Patient denies cough and shortness of breath.  Endocrine:   Patient denies excessive thirst.  Musculoskeletal:   Patient denies back pain and joint pain.  Neurological:   Patient denies headaches and dizziness.  Psychologic:   Patient denies depression and anxiety.   VITAL SIGNS:      02/10/2016 01:20 PM  BP 156/83 mmHg  Pulse 84 /min  Temperature 98.2 F / 37 C   GU PHYSICAL EXAMINATION:    Scrotum: No lesions. No edema. No cysts. No warts.  Epididymides: Right: no spermatocele, no masses, no cysts, no tenderness, no induration, no enlargement. Left: no spermatocele, no masses, no cysts, no tenderness, no induration, no enlargement.  Testes: No tenderness, no swelling, no enlargement left testes. No tenderness, no swelling, no enlargement right testes. Normal location left testes. Normal location right testes. No mass, no cyst, no varicocele, no hydrocele left testes. No mass, no cyst, no varicocele, no hydrocele right testes.  Urethral Meatus: Unable to visualize.  Penis: Penis uncircumcised, phimosis. No foreskin warts, no cracks. No dorsal  peyronie's plaques, no left corporal peyronie's plaques, no right corporal peyronie's plaques, no scarring, no shaft warts.   MULTI-SYSTEM PHYSICAL EXAMINATION:    Constitutional: Morbidly Obese. No physical deformities. Normally developed. Good grooming.   Neurologic / Psychiatric: Oriented to time, oriented to place, oriented to person. No depression, no anxiety, no agitation.  Gastrointestinal: Morbidly Obese abdomen. He has a very large panus obscuring the penile shaft. No mass, no tenderness, no rigidity.      PAST DATA REVIEWED:  Source Of History:  Patient  Records Review:   AUA Symptom Score, Previous Patient Records   12/22/10 02/20/08  PSA  Total PSA 1.54  0.27     02/10/16  Urinalysis  Urine Appearance Cloudy   Urine Color Yellow   Urine Glucose Neg   Urine Bilirubin Neg   Urine Ketones Neg   Urine Specific Gravity 1.025   Urine Blood Neg   Urine pH 6.0   Urine Protein Trace   Urine Urobilinogen 0.2   Urine Nitrites Neg   Urine Leukocyte Esterase 3+   Urine WBC/hpf 40 - 60/hpf   Urine RBC/hpf 0 - 2/hpf   Urine Epithelial Cells 0 - 5/hpf   Urine Bacteria Mod (26-50/hpf)   Urine Mucous Not Present  Urine Yeast NS (Not Seen)    PROCEDURES:          Urinalysis w/Scope Dipstick Dipstick Cont'd Micro  Color: Yellow Bilirubin: Neg WBC/hpf: 40 - 60/hpf  Appearance: Cloudy Ketones: Neg RBC/hpf: 0 - 2/hpf  Specific Gravity: 1.025 Blood: Neg Bacteria: Mod (26-50/hpf)  pH: 6.0 Protein: Trace Cystals: NS (Not Seen)  Glucose: Neg Urobilinogen: 0.2 Casts: NS (Not Seen)    Nitrites: Neg Trichomonas: Not Present    Leukocyte Esterase: 3+ Mucous: Not Present      Epithelial Cells: 0 - 5/hpf      Yeast: NS (Not Seen)      Sperm: Not Present    Notes: microscopic performed on unspun urine    ASSESSMENT:      ICD-10 Details  1 GU:   Balanitis - N48.1   2   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 He has been treated with Clobetasol cream, which has helped him somewhat. Physical examination shows severe phimosis, and he will need repeat circumcision. He has definite decrease in his blood supply to the peripheral foreskin.   We will ask him to see Dr. Sarajane Jews for general physical examination prior to outpatient surgical anesthesia.    PLAN:           Orders Labs Urine Culture and Sensitivity          Schedule Return Visit: Next Available Appointment - Schedule Surgery          Document Letter(s):  Created for Patient: Clinical Summary         Notes:   cc: Dr. Alysia Penna   cirmumcision in January,          The information contained in this medical record document is considered private and confidential patient information. This information can only be used for the medical diagnosis and/or medical services that are being provided by the patient's selected caregivers. This information can only be distributed outside of the patient's care if the patient agrees and signs waivers of authorization for this information to be sent to an outside source or route.

## 2016-03-26 NOTE — Interval H&P Note (Signed)
History and Physical Interval Note:  03/26/2016 12:25 PM  Gabriel Love  has presented today for surgery, with the diagnosis of PHIMOSIS  The various methods of treatment have been discussed with the patient and family. After consideration of risks, benefits and other options for treatment, the patient has consented to  Procedure(s): CIRCUMCISION ADULT (N/A) as a surgical intervention .  The patient's history has been reviewed, patient examined, no change in status, stable for surgery.  I have reviewed the patient's chart and labs.  Questions were answered to the patient's satisfaction.     Clovis Warwick I Ayahna Solazzo

## 2016-03-26 NOTE — Op Note (Signed)
Pre-operative diagnosis :  Recurrent phimosis  Postoperative diagnosis:  Same  Operation: Circumcision  Surgeon:  S. Gaynelle Arabian, MD  First assistant: none  Anesthesia:  GET, plus local marcaine 0.25% penile block  Preparation: After appropriate preanesthesia, the patient was brought the operating, placed on the operating table in the dorsal supine position where general endotracheal anesthesia was introduced. He'll maintain his in this position, where the pubis was prepped with Betadine solution and draped in usual fashion.  Review history:  have irritation and discomfort of the head of my penis.  HPI: Jan Sawada is a 76 year-old male established patient who is here for irritation and discomfort of the head of his penis.  His symptoms did begin gradually. His symptoms have been worse over the last year.   He was not circumcised at birth. He would like to be circumcised. He does have difficulty retracting his foreskin. He has had to use creams on the head of his penis. He does not have diabetes.   He is currently having trouble urinating.   He is s/p elective circumcision many years ago. the patient now has worsening ability to void secondary phimosis. He has been treating himself with steroid cream, but still has significant phimosis. Inflammation is somewhat better, however.   Statement of  Likelihood of Success: Excellent. TIME-OUT observed.:  Procedure:  Inspection of the penis revealed that the patient had generalized scarring of the penile glans circumferentially. The penile shaft skin was grown to the glans. This required a dorsal slit incision, which was accomplished after a penile block was given, with 0.25 plain Marcaine circumferentially. This was also accomplished with injection at the base of the penis.  Following incision at 12:00, the penile skin was dissected proximalward. The skin was stuck circumferentially. There was no collar from previous circumcision noted. I  elected to incise the skin for 1 cm, and excised the skin, and then placed 4 separate quadrant sutures through the foreskin, and through the subcutaneous tissue, and into the corpora, leaving a 1 cm area of collar behind. Each quadrant was then closed with interrupted 4-0 Monocryl suture. The opened area of collar was covered with petroleum gauze, and then sterile dressing was applied. The patient received IV Toradol, and was awakened and taken to recovery room in good condition. The glans remained pink. The urethra was normal. The patient was awakened and taken to recovery room in good condition.

## 2016-03-26 NOTE — Anesthesia Preprocedure Evaluation (Addendum)
Anesthesia Evaluation  Patient identified by MRN, date of birth, ID band Patient awake    Reviewed: Allergy & Precautions, NPO status , Patient's Chart, lab work & pertinent test results  History of Anesthesia Complications Negative for: history of anesthetic complications  Airway Mallampati: II  TM Distance: >3 FB Neck ROM: Full    Dental no notable dental hx. (+) Dental Advisory Given   Pulmonary former smoker,    Pulmonary exam normal breath sounds clear to auscultation       Cardiovascular hypertension, Pt. on medications + Peripheral Vascular Disease  Normal cardiovascular exam Rhythm:Regular Rate:Normal     Neuro/Psych PSYCHIATRIC DISORDERS Depression negative neurological ROS     GI/Hepatic negative GI ROS, Neg liver ROS,   Endo/Other  Morbid obesity  Renal/GU negative Renal ROS  negative genitourinary   Musculoskeletal  (+) Arthritis ,   Abdominal (+) + obese,   Peds negative pediatric ROS (+)  Hematology negative hematology ROS (+)   Anesthesia Other Findings   Reproductive/Obstetrics negative OB ROS                            Anesthesia Physical  Anesthesia Plan  ASA: III  Anesthesia Plan: General   Post-op Pain Management:    Induction: Intravenous  Airway Management Planned: LMA  Additional Equipment:   Intra-op Plan:   Post-operative Plan: Extubation in OR  Informed Consent: I have reviewed the patients History and Physical, chart, labs and discussed the procedure including the risks, benefits and alternatives for the proposed anesthesia with the patient or authorized representative who has indicated his/her understanding and acceptance.   Dental advisory given  Plan Discussed with: CRNA  Anesthesia Plan Comments:         Anesthesia Quick Evaluation

## 2016-03-26 NOTE — Anesthesia Procedure Notes (Signed)
Procedure Name: Intubation Date/Time: 03/26/2016 12:40 PM Performed by: Pilar Grammes Pre-anesthesia Checklist: Patient identified, Emergency Drugs available, Suction available, Patient being monitored and Timeout performed Patient Re-evaluated:Patient Re-evaluated prior to inductionOxygen Delivery Method: Circle system utilized Preoxygenation: Pre-oxygenation with 100% oxygen Intubation Type: IV induction Ventilation: Mask ventilation without difficulty Laryngoscope Size: Miller and 2 Grade View: Grade I Tube type: Oral Tube size: 7.5 mm Number of attempts: 1 Airway Equipment and Method: Stylet Placement Confirmation: positive ETCO2,  ETT inserted through vocal cords under direct vision,  CO2 detector and breath sounds checked- equal and bilateral Secured at: 23 cm Tube secured with: Tape Dental Injury: Teeth and Oropharynx as per pre-operative assessment

## 2016-03-26 NOTE — Discharge Instructions (Addendum)
Paraphimosis Introduction Paraphimosis is a serious condition in which the fold of skin that stretches over the tip of the penis (foreskin) becomes stuck when it is pulled back. This condition blocks blood from flowing away from the penis tip, and that causes swelling that gets worse and worse. Paraphimosis needs to be treated right away. What are the causes? This condition may be caused by:  A foreskin that is tighter than normal.  Leaving the foreskin pulled back after a procedure, such as after the placement of a catheter.  A foreskin that has been pulled back for too long.  A forceful pulling back of the foreskin.  Infection under the foreskin.  Trauma to the area, such as a hard hit to the the tip of the penis.  Having sex or masturbating.  Hair or clothing that gets wrapped around the penis. What increases the risk? This condition is more likely to develop in:  Males who have not had their foreskin removed.  Males who have frequent urological procedures.  Males who have poor hygiene.  Males who need help with daily hygiene from a caregiver. What are the signs or symptoms? Symptoms of this condition include:  A foreskin that cannot be returned to its normal position.  Pain, often at the tip of the penis.  Swelling of the penis.  Anxiety.  Trouble urinating.  Skin that is red or bluish at the tip of the penis. How is this diagnosed? This condition may be diagnosed with a physical exam. You may also have X-rays. How is this treated? This condition may be treated with:  A procedure to force the foreskin back over the tip of the penis. Swelling may first be reduced with:  Ice.  A bandage wrapped tightly around the penis.  Needles to drain any pus or blood that is causing the swelling.  Medicine to relieve pain. Medicine may be given by mouth, through an IV tube, or by an injection to the base of the penis (nerve block).  A procedure in which a small cut  (incision) is made in the tightened foreskin to free it and allow it to be pulled back into place (dorsal slit procedure).  Surgery to remove the foreskin (circumcision). This may be done if the foreskin cannot be moved back into place. Most of the time, treatment can be done in a clinic or a health care provider's office. Follow these instructions at home:  Take over-the-counter and prescription medicines only as told by your health care provider.  Apply any creams to the affected area only as told by your health care provider.  If the treated area is covered with gauze or a bandage (dressing), follow instructions from your health care provider about:  When and how you should change your bandage (dressing).  When you should remove your dressing.  Whether the area can get wet.  Wear loose undergarments to avoid applying pressure to the area.  Follow instructions from your health care provider about avoiding sexual activity.  Keep all follow-up visits as told by your health care provider. This is important. Contact a health care provider if:  The treated area of skin does not heal, or it becomes more irritated, red, or bloody.  Urination is difficult or painful.  Pain in the penis continues, even after you take medicine for pain. Get help right away if:  You develop a fever. This information is not intended to replace advice given to you by your health care provider. Make sure  you discuss any questions you have with your health care provider. Document Released: 01/03/2009 Document Revised: 08/14/2015 Document Reviewed: 06/03/2014  2017 Elsevier  Circumcision, Adult, Care After These instructions give you information on caring for yourself after your procedure. Your doctor may also give you more specific instructions. Call your doctor if you have any problems or questions after your procedure. Follow these instructions at home:  Only take medicine as told by your  doctor.  Any bandages (dressings) should stay on for at least 24 hours.  You may take the bandages off at night to let air get to the area where your doctor made a cut (incision site). Once a scab forms over the cut, you will not need to use bandages.  Carefully remove the bandage if it gets dirty. Apply medicated cream to the cut. Carefully put a new bandage on if a scab has not formed.  Do not have sex until your doctor says it is okay.  Do not get your cut wet for 24 hours or as told by your doctor.  You may take a sponge bath. Clean around the cut gently with mild soap and water.  You may take a shower after 24 hours or as told by your doctor. Do not take a tub bath. After you shower, gently pat your cut dry. Do not rub it.  Avoid heavy lifting.  Avoid contact sports, biking, or swimming until you have healed. This usually takes 10-14 days. Contact a doctor if:  You have pain that does not go away after you take medicine for it.  You have puffiness (swelling) or redness that is unexpected.  You have a fever. Get help right away if:  You cannot pee (urinate).  You have pain when you pee.  Your pain is not helped by medicines.  There is redness, puffiness, and soreness spreading up the shaft of your penis, your thighs, or your lower belly (abdomen).  There is yellowish-white fluid (pus) coming from your cut.  You have bleeding that does not stop when you press on it. This information is not intended to replace advice given to you by your health care provider. Make sure you discuss any questions you have with your health care provider. Document Released: 08/25/2007 Document Revised: 08/14/2015 Document Reviewed: 11/02/2012 Elsevier Interactive Patient Education  2017 [place] [facility] Anesthesia, Adult, Care After These instructions provide you with information about caring for yourself after your procedure. Your health care provider may also give you more specific  instructions. Your treatment has been planned according to current medical practices, but problems sometimes occur. Call your health care provider if you have any problems or questions after your procedure. What can I expect after the procedure? After the procedure, it is common to have:  Vomiting.  A sore throat.  Mental slowness. It is common to feel:  Nauseous.  Cold or shivery.  Sleepy.  Tired.  Sore or achy, even in parts of your body where you did not have surgery. Follow these instructions at home: For at least 24 hours after the procedure:  Do not:  Participate in activities where you could fall or become injured.  Drive.  Use heavy machinery.  Drink alcohol.  Take sleeping pills or medicines that cause drowsiness.  Make important decisions or sign legal documents.  Take care of children on your own.  Rest. Eating and drinking  If you vomit, drink water, juice, or soup when you can drink without vomiting.  Drink enough fluid  to keep your urine clear or pale yellow.  Make sure you have little or no nausea before eating solid foods.  Follow the diet recommended by your health care provider. General instructions  Have a responsible adult stay with you until you are awake and alert.  Return to your normal activities as told by your health care provider. Ask your health care provider what activities are safe for you.  Take over-the-counter and prescription medicines only as told by your health care provider.  If you smoke, do not smoke without supervision.  Keep all follow-up visits as told by your health care provider. This is important. Contact a health care provider if:  You continue to have nausea or vomiting at home, and medicines are not helpful.  You cannot drink fluids or start eating again.  You cannot urinate after 8-12 hours.  You develop a skin rash.  You have fever.  You have increasing redness at the site of your procedure. Get  help right away if:  You have difficulty breathing.  You have chest pain.  You have unexpected bleeding.  You feel that you are having a life-threatening or urgent problem. This information is not intended to replace advice given to you by your health care provider. Make sure you discuss any questions you have with your health care provider. Document Released: 06/14/2000 Document Revised: 08/11/2015 Document Reviewed: 02/20/2015 Elsevier Interactive Patient Education  2017 [employer]. .

## 2016-03-29 ENCOUNTER — Encounter (HOSPITAL_COMMUNITY): Payer: Self-pay | Admitting: Urology

## 2016-03-31 ENCOUNTER — Other Ambulatory Visit (INDEPENDENT_AMBULATORY_CARE_PROVIDER_SITE_OTHER): Payer: Self-pay | Admitting: Orthopaedic Surgery

## 2016-03-31 NOTE — Telephone Encounter (Signed)
Ok for refill? 

## 2016-04-12 ENCOUNTER — Other Ambulatory Visit: Payer: Self-pay | Admitting: Family Medicine

## 2016-05-05 ENCOUNTER — Other Ambulatory Visit: Payer: Self-pay | Admitting: Family Medicine

## 2016-06-27 ENCOUNTER — Other Ambulatory Visit (INDEPENDENT_AMBULATORY_CARE_PROVIDER_SITE_OTHER): Payer: Self-pay | Admitting: Orthopaedic Surgery

## 2016-06-28 NOTE — Telephone Encounter (Signed)
Ok for refill? 

## 2016-07-12 ENCOUNTER — Other Ambulatory Visit: Payer: Self-pay | Admitting: Family Medicine

## 2016-08-09 ENCOUNTER — Other Ambulatory Visit: Payer: Self-pay | Admitting: Family Medicine

## 2016-08-30 ENCOUNTER — Encounter: Payer: Self-pay | Admitting: Family Medicine

## 2016-08-30 ENCOUNTER — Ambulatory Visit (INDEPENDENT_AMBULATORY_CARE_PROVIDER_SITE_OTHER): Payer: Medicare Other | Admitting: Family Medicine

## 2016-08-30 VITALS — BP 128/76 | Temp 98.6°F | Ht 67.0 in | Wt 324.0 lb

## 2016-08-30 DIAGNOSIS — S80861A Insect bite (nonvenomous), right lower leg, initial encounter: Secondary | ICD-10-CM | POA: Diagnosis not present

## 2016-08-30 DIAGNOSIS — W57XXXA Bitten or stung by nonvenomous insect and other nonvenomous arthropods, initial encounter: Secondary | ICD-10-CM | POA: Diagnosis not present

## 2016-08-30 MED ORDER — DOXYCYCLINE HYCLATE 100 MG PO CAPS: 100.0000 mg | ORAL_CAPSULE | Freq: Two times a day (BID) | ORAL | 0 refills | Status: AC

## 2016-08-30 NOTE — Patient Instructions (Signed)
WE NOW OFFER   [facility] FAST TRACK!!!  SAME DAY Appointments for ACUTE CARE  Such as: Sprains, Injuries, cuts, abrasions, rashes, muscle pain, joint pain, back pain Colds, flu, sore throats, headache, allergies, cough, fever  Ear pain, sinus and eye infections Abdominal pain, nausea, vomiting, diarrhea, upset stomach Animal/insect bites  3 Easy Ways to Schedule: Walk-In Scheduling Call in scheduling Mychart Sign-up: [email or web address]         

## 2016-08-30 NOTE — Progress Notes (Signed)
   Subjective:    Patient ID: Gabriel Love, male    DOB: [birth year], 76 y.o.   MRN: [id number]  HPI Here for a tick bite on the right lower leg. He found the tick 2 days ago and pulled it out with tweezers. The area was warm and red yesterday but not today. No fever.    Review of Systems  Constitutional: Negative.   Respiratory: Negative.   Cardiovascular: Negative.   Skin: Positive for color change.       Objective:   Physical Exam  Constitutional: He is oriented to person, place, and time. He appears well-developed and well-nourished.  Cardiovascular: Normal rate, regular rhythm, normal heart sounds and intact distal pulses.   Pulmonary/Chest: Effort normal and breath sounds normal. No respiratory distress. He has no wheezes. He has no rales.  Neurological: He is alert and oriented to person, place, and time.  Skin:  Right lower leg has a tiny puncture mark, no erythema or tenderness           Assessment & Plan:  Tick bite, cover with Doxycycline for 7 days.  Alysia Penna, MD

## 2016-09-24 ENCOUNTER — Other Ambulatory Visit (INDEPENDENT_AMBULATORY_CARE_PROVIDER_SITE_OTHER): Payer: Self-pay | Admitting: Orthopaedic Surgery

## 2016-09-24 NOTE — Telephone Encounter (Signed)
Ok to refill 

## 2016-10-18 ENCOUNTER — Other Ambulatory Visit: Payer: Self-pay | Admitting: Family Medicine

## 2016-11-13 ENCOUNTER — Other Ambulatory Visit: Payer: Self-pay | Admitting: Adult Health

## 2016-12-30 ENCOUNTER — Ambulatory Visit: Payer: Medicare Other

## 2017-01-11 ENCOUNTER — Ambulatory Visit (INDEPENDENT_AMBULATORY_CARE_PROVIDER_SITE_OTHER): Payer: Medicare Other

## 2017-01-11 DIAGNOSIS — Z23 Encounter for immunization: Secondary | ICD-10-CM | POA: Diagnosis not present

## 2017-01-28 ENCOUNTER — Encounter: Payer: Self-pay | Admitting: Family Medicine

## 2017-01-28 ENCOUNTER — Ambulatory Visit: Payer: Medicare Other | Admitting: Family Medicine

## 2017-01-28 VITALS — BP 148/98 | HR 103 | Temp 98.4°F | Ht 67.0 in | Wt 329.0 lb

## 2017-01-28 DIAGNOSIS — M159 Polyosteoarthritis, unspecified: Secondary | ICD-10-CM

## 2017-01-28 DIAGNOSIS — M150 Primary generalized (osteo)arthritis: Secondary | ICD-10-CM

## 2017-01-28 DIAGNOSIS — N3946 Mixed incontinence: Secondary | ICD-10-CM | POA: Insufficient documentation

## 2017-01-28 DIAGNOSIS — I10 Essential (primary) hypertension: Secondary | ICD-10-CM | POA: Diagnosis not present

## 2017-01-28 DIAGNOSIS — M48061 Spinal stenosis, lumbar region without neurogenic claudication: Secondary | ICD-10-CM | POA: Diagnosis not present

## 2017-01-28 DIAGNOSIS — M1990 Unspecified osteoarthritis, unspecified site: Secondary | ICD-10-CM | POA: Insufficient documentation

## 2017-01-28 MED ORDER — LISINOPRIL 10 MG PO TABS: 10.0000 mg | ORAL_TABLET | Freq: Every day | ORAL | 3 refills | Status: DC

## 2017-01-28 MED ORDER — SOLIFENACIN SUCCINATE 5 MG PO TABS: 5.0000 mg | ORAL_TABLET | Freq: Every day | ORAL | 3 refills | Status: DC

## 2017-01-28 NOTE — Patient Instructions (Signed)
WE NOW OFFER   [facility] FAST TRACK!!!  SAME DAY Appointments for ACUTE CARE  Such as: Sprains, Injuries, cuts, abrasions, rashes, muscle pain, joint pain, back pain Colds, flu, sore throats, headache, allergies, cough, fever  Ear pain, sinus and eye infections Abdominal pain, nausea, vomiting, diarrhea, upset stomach Animal/insect bites  3 Easy Ways to Schedule: Walk-In Scheduling Call in scheduling Mychart Sign-up: [email or web address]         

## 2017-01-28 NOTE — Progress Notes (Signed)
   Subjective:    Patient ID: Gabriel Love, male    DOB: 08-Jul-1940, 76 y.o.   MRN: [id number]  HPI Here to follow up. He has a few issues for [place]. First he asks if he can have a walker to get around. He is using a cane but his arthritis makes it hard to walk and he loses his balance. He also has trouble with urinary urgency and leakage. He has to urinate frequently and he often cannot make it to the bathroom on time. No discomfort. He wears Attends.    Review of Systems  Constitutional: Negative.   Respiratory: Negative.   Cardiovascular: Negative.   Gastrointestinal: Negative.   Genitourinary: Positive for frequency and urgency.  Musculoskeletal: Positive for arthralgias.       Objective:   Physical Exam  Constitutional: He is oriented to person, place, and time.  Morbidly obese, using a cane   Cardiovascular: Normal rate, regular rhythm, normal heart sounds and intact distal pulses.  Pulmonary/Chest: Effort normal and breath sounds normal. No respiratory distress. He has no wheezes. He has no rales.  Abdominal: Soft. Bowel sounds are normal. He exhibits no distension and no mass. There is no tenderness. There is no rebound and no guarding.  Neurological: He is alert and oriented to person, place, and time.          Assessment & Plan:  His HTN is under borderline control. We will stop the HCTZ and substitute Lisinopril 10 mg daily. This will help the urge incontinence as well. Try Vesicare 5 mg daily for that. Wrote for him to get a rolling walker with an attached seat and hand brakes. Recheck in 4 weeks.  Alysia Penna, MD

## 2017-02-07 ENCOUNTER — Encounter: Payer: Self-pay | Admitting: Family Medicine

## 2017-02-07 ENCOUNTER — Ambulatory Visit: Payer: Medicare Other | Admitting: Family Medicine

## 2017-02-07 VITALS — BP 138/74 | HR 81 | Temp 97.8°F | Wt 343.4 lb

## 2017-02-07 DIAGNOSIS — I10 Essential (primary) hypertension: Secondary | ICD-10-CM | POA: Diagnosis not present

## 2017-02-07 DIAGNOSIS — R600 Localized edema: Secondary | ICD-10-CM | POA: Insufficient documentation

## 2017-02-07 DIAGNOSIS — J209 Acute bronchitis, unspecified: Secondary | ICD-10-CM | POA: Diagnosis not present

## 2017-02-07 DIAGNOSIS — N3946 Mixed incontinence: Secondary | ICD-10-CM | POA: Diagnosis not present

## 2017-02-07 MED ORDER — FUROSEMIDE 20 MG PO TABS: 20.0000 mg | ORAL_TABLET | Freq: Every day | ORAL | 3 refills | Status: DC

## 2017-02-07 MED ORDER — AZITHROMYCIN 250 MG PO TABS: ORAL_TABLET | ORAL | 0 refills | Status: DC

## 2017-02-07 MED ORDER — ALBUTEROL SULFATE HFA 108 (90 BASE) MCG/ACT IN AERS
2.0000 | INHALATION_SPRAY | RESPIRATORY_TRACT | 11 refills | Status: DC | PRN
Start: 2017-02-07 — End: 2018-07-04

## 2017-02-07 NOTE — Progress Notes (Signed)
   Subjective:    Patient ID: Gabriel Love, male    DOB: 02-May-1940, 76 y.o.   MRN: [id number]  HPI Here for several issues. First we changed his BP meds around a few weeks ago by stopping HCTZ and starting Lisinopril. His BP has come down nicely. However he has started to retain some fluid and his ankles are swelling. Also for the past week he has been coughing. He has PND and chest tightness with wheezing. No fever. Using Nyquil and Dayquil.    Review of Systems  Constitutional: Negative.   HENT: Negative.   Eyes: Negative.   Respiratory: Positive for cough, shortness of breath and wheezing.   Cardiovascular: Positive for leg swelling. Negative for chest pain and palpitations.  Neurological: Negative.        Objective:   Physical Exam  Constitutional: He is oriented to person, place, and time. He appears well-developed and well-nourished.  HENT:  Right Ear: External ear normal.  Left Ear: External ear normal.  Nose: Nose normal.  Mouth/Throat: Oropharynx is clear and moist.  Eyes: Conjunctivae are normal.  Neck: Neck supple. No thyromegaly present.  Cardiovascular: Normal rate, regular rhythm, normal heart sounds and intact distal pulses.  Pulmonary/Chest: Effort normal. No respiratory distress. He has no rales.  Scattered wheezes and rhonchi  Musculoskeletal:  2+ edema in both ankles   Lymphadenopathy:    He has no cervical adenopathy.  Neurological: He is alert and oriented to person, place, and time.          Assessment & Plan:  His HTN is stable, but he is retaining some fluid. Start on Lasix 20 mg daily. He is getting good relief from his bladder trouble with Vesicare. He also has a bronchitis, so we will treat with a Zpack. Refilled his albuterol inhaler as well.  Alysia Penna, MD

## 2017-02-14 ENCOUNTER — Encounter: Payer: Self-pay | Admitting: Family Medicine

## 2017-02-14 ENCOUNTER — Ambulatory Visit (INDEPENDENT_AMBULATORY_CARE_PROVIDER_SITE_OTHER)
Admission: RE | Admit: 2017-02-14 | Discharge: 2017-02-14 | Disposition: A | Discharge status: Home / Self Care | Payer: Medicare Other | Source: Ambulatory Visit | Attending: Family Medicine | Admitting: Family Medicine

## 2017-02-14 ENCOUNTER — Ambulatory Visit: Payer: Medicare Other | Admitting: Family Medicine

## 2017-02-14 VITALS — BP 158/84 | HR 64 | Temp 98.0°F | Wt 343.0 lb

## 2017-02-14 DIAGNOSIS — R0602 Shortness of breath: Secondary | ICD-10-CM

## 2017-02-14 LAB — HEPATIC FUNCTION PANEL
ALBUMIN: 4 g/dL (ref 3.5–5.2)
ALK PHOS: 74 U/L (ref 39–117)
ALT: 17 U/L (ref 0–53)
AST: 20 U/L (ref 0–37)
Bilirubin, Direct: 0.1 mg/dL (ref 0.0–0.3)
TOTAL PROTEIN: 6.6 g/dL (ref 6.0–8.3)
Total Bilirubin: 0.8 mg/dL (ref 0.2–1.2)

## 2017-02-14 LAB — CBC WITH DIFFERENTIAL/PLATELET
BASOS ABS: 0 10*3/uL (ref 0.0–0.1)
Basophils Relative: 0.5 % (ref 0.0–3.0)
EOS ABS: 0.3 10*3/uL (ref 0.0–0.7)
Eosinophils Relative: 2.8 % (ref 0.0–5.0)
HCT: 43.7 % (ref 39.0–52.0)
Hemoglobin: 14.5 g/dL (ref 13.0–17.0)
LYMPHS PCT: 18 % (ref 12.0–46.0)
Lymphs Abs: 1.7 10*3/uL (ref 0.7–4.0)
MCHC: 33.2 g/dL (ref 30.0–36.0)
MCV: 88.7 fl (ref 78.0–100.0)
Monocytes Absolute: 0.8 10*3/uL (ref 0.1–1.0)
Monocytes Relative: 8.5 % (ref 3.0–12.0)
NEUTROS ABS: 6.7 10*3/uL (ref 1.4–7.7)
NEUTROS PCT: 70.2 % (ref 43.0–77.0)
PLATELETS: 252 10*3/uL (ref 150.0–400.0)
RBC: 4.93 Mil/uL (ref 4.22–5.81)
RDW: 14.4 % (ref 11.5–15.5)
WBC: 9.5 10*3/uL (ref 4.0–10.5)

## 2017-02-14 LAB — BASIC METABOLIC PANEL
BUN: 20 mg/dL (ref 6–23)
CALCIUM: 8.8 mg/dL (ref 8.4–10.5)
CO2: 30 meq/L (ref 19–32)
CREATININE: 0.69 mg/dL (ref 0.40–1.50)
Chloride: 103 mEq/L (ref 96–112)
GFR: 118 mL/min (ref >60.00)
GLUCOSE: 97 mg/dL (ref 70–99)
Potassium: 3.4 mEq/L — ABNORMAL LOW (ref 3.5–5.1)
Sodium: 143 mEq/L (ref 135–145)

## 2017-02-14 LAB — TSH: TSH: 3.38 u[IU]/mL (ref 0.35–4.50)

## 2017-02-14 MED ORDER — FUROSEMIDE 40 MG PO TABS: 40.0000 mg | ORAL_TABLET | Freq: Every day | ORAL | 5 refills | Status: DC

## 2017-02-14 NOTE — Progress Notes (Signed)
   Subjective:    Patient ID: Gabriel Love, male    DOB: [birth year], 76 y.o.   MRN: [id number]  HPI Here to follow up on wheezing and SOB. He was seen on 02-07-17 and he was coughing quite a bit. He was felt to have a bronchitis and he was given a Zpack. He was also given an albuterol inhaler and he started Lasix 20 mg daily for ankle swelling. Since then he has been urinating frequently and the ankle swelling has gone down a bit. The coughing has improved but he is still quite SOB and he wheezes at times. No fever. No chest pain. Of note he had an ECHO in 2013 showing some LVH, an EF of 55-60%, and grade I diastolic dysfunction. He is a former smoker.    Review of Systems  Constitutional: Negative.   Respiratory: Positive for cough, shortness of breath and wheezing.   Cardiovascular: Positive for leg swelling. Negative for chest pain and palpitations.  Neurological: Negative.        Objective:   Physical Exam  Constitutional: He is oriented to person, place, and time. He appears well-developed and well-nourished.  Mildly SOB on walking. Soft audible wheezes.   Neck: No thyromegaly present.  Cardiovascular: Normal rate, regular rhythm, normal heart sounds and intact distal pulses.  No murmur heard. Pulmonary/Chest: He has no rales.  Scattered wheezes, soft bibasilar crackles   Musculoskeletal:  2+ edema in both ankles   Lymphadenopathy:    He has no cervical adenopathy.  Neurological: He is alert and oriented to person, place, and time.          Assessment & Plan:  At this point it looks less likely that he has a bronchitis and more likely either some COPD or some CHF developing. We will get labs today including a BMET to check renal function. Use the albuterol inhaler as needed. Increase the Lasix to 40 mg daily. We will send him for a CXR today and set up another ECHO for one day this week.  Alysia Penna, MD

## 2017-02-18 ENCOUNTER — Ambulatory Visit (HOSPITAL_COMMUNITY): Payer: Medicare Other | Attending: Cardiology

## 2017-02-18 ENCOUNTER — Other Ambulatory Visit: Payer: Self-pay

## 2017-02-18 DIAGNOSIS — Z87891 Personal history of nicotine dependence: Secondary | ICD-10-CM | POA: Insufficient documentation

## 2017-02-18 DIAGNOSIS — E785 Hyperlipidemia, unspecified: Secondary | ICD-10-CM | POA: Insufficient documentation

## 2017-02-18 DIAGNOSIS — I509 Heart failure, unspecified: Secondary | ICD-10-CM | POA: Diagnosis not present

## 2017-02-18 DIAGNOSIS — I110 Hypertensive heart disease with heart failure: Secondary | ICD-10-CM | POA: Diagnosis not present

## 2017-02-18 DIAGNOSIS — R0602 Shortness of breath: Secondary | ICD-10-CM | POA: Diagnosis not present

## 2017-04-16 ENCOUNTER — Other Ambulatory Visit: Payer: Self-pay | Admitting: Family Medicine

## 2017-04-26 ENCOUNTER — Other Ambulatory Visit: Payer: Self-pay | Admitting: Family Medicine

## 2017-07-11 ENCOUNTER — Other Ambulatory Visit: Payer: Self-pay | Admitting: Family Medicine

## 2017-08-11 ENCOUNTER — Other Ambulatory Visit: Payer: Self-pay | Admitting: Family Medicine

## 2017-08-12 ENCOUNTER — Other Ambulatory Visit: Payer: Self-pay | Admitting: Family Medicine

## 2017-11-23 ENCOUNTER — Telehealth: Payer: Self-pay | Admitting: Family Medicine

## 2017-11-23 MED ORDER — ESCITALOPRAM OXALATE 10 MG PO TABS: 10.0000 mg | ORAL_TABLET | Freq: Every day | ORAL | 5 refills | Status: DC

## 2017-11-23 NOTE — Telephone Encounter (Signed)
Copied from [place] [id number]. Topic: Quick Communication - Rx Refill/Question >> Nov 23, 2017  2:25 PM Reyne Dumas L wrote: Medication:  LEXAPRO 10 MG tablet  Pt's wife calling in.  States that pharmacy keeps refilling this medication as a generic.  Pt's wife states that he can not take the generic and the pharmacy states they need a note from the physician saying this so that the insurance will pay correctly and they can dispense the medication correctly. Pt uses [facility] [phone] - [place], [place] - [address] AT [place] [phone] (Phone) [phone] (Fax)  Wife, Izora Gala, can be reached at [phone]

## 2017-11-23 NOTE — Telephone Encounter (Signed)
Rx has been resent with directions for BRAND NAME ONLY. Called the pharmacy they are aware. Called and spoke with pt's wife who was advised that this issue has been addressed.

## 2017-11-29 ENCOUNTER — Telehealth: Payer: Self-pay | Admitting: Family Medicine

## 2017-11-29 NOTE — Telephone Encounter (Signed)
Copied from [place] [id number]. Topic: Quick Communication - See Telephone Encounter >> Nov 29, 2017 11:41 AM Ahmed Prima L wrote: CRM for notification. See Telephone encounter for: 11/29/17. Patient's wife, Izora Gala called and said ever since [facility] its been very hard to get a refill on his albuterol (PROVENTIL HFA;VENTOLIN HFA) 108 (90 Base) MCG/ACT inhaler. She wants to know could someone call and give them a new prescription? Thanks.  [facility] [place], [place] AT [place] [address] [place] [phone]

## 2017-11-29 NOTE — Telephone Encounter (Signed)
[place] called and spoke to Grant, [facility] about the patient's wife call. Adria says they must be calling refills off an old prescription that she has the one on file from 02/07/17 11 refills and will get that refilled for the patient.

## 2017-12-20 ENCOUNTER — Ambulatory Visit (INDEPENDENT_AMBULATORY_CARE_PROVIDER_SITE_OTHER): Payer: Medicare Other | Admitting: Family Medicine

## 2017-12-20 ENCOUNTER — Encounter: Payer: Self-pay | Admitting: Family Medicine

## 2017-12-20 VITALS — BP 154/88 | HR 108 | Temp 97.9°F | Wt 343.6 lb

## 2017-12-20 DIAGNOSIS — R296 Repeated falls: Secondary | ICD-10-CM | POA: Diagnosis not present

## 2017-12-20 DIAGNOSIS — R058 Other specified cough: Secondary | ICD-10-CM

## 2017-12-20 DIAGNOSIS — Z23 Encounter for immunization: Secondary | ICD-10-CM | POA: Diagnosis not present

## 2017-12-20 DIAGNOSIS — M6208 Separation of muscle (nontraumatic), other site: Secondary | ICD-10-CM

## 2017-12-20 DIAGNOSIS — R05 Cough: Secondary | ICD-10-CM | POA: Diagnosis not present

## 2017-12-20 DIAGNOSIS — T464X5A Adverse effect of angiotensin-converting-enzyme inhibitors, initial encounter: Secondary | ICD-10-CM

## 2017-12-20 DIAGNOSIS — F331 Major depressive disorder, recurrent, moderate: Secondary | ICD-10-CM

## 2017-12-20 DIAGNOSIS — I10 Essential (primary) hypertension: Secondary | ICD-10-CM | POA: Diagnosis not present

## 2017-12-20 MED ORDER — LEXAPRO 20 MG PO TABS: 20.0000 mg | ORAL_TABLET | Freq: Every day | ORAL | 11 refills | Status: DC

## 2017-12-20 MED ORDER — AMLODIPINE BESYLATE 5 MG PO TABS: 5.0000 mg | ORAL_TABLET | Freq: Every day | ORAL | 11 refills | Status: DC

## 2017-12-20 NOTE — Progress Notes (Signed)
   Subjective:    Patient ID: Gabriel Love, male    DOB: 02/04/1941, 77 y.o.   MRN: [id number]  HPI Here for several issues. First he has fallen several times in the past few months and his balance is worse than ever. He typically uses a cane around the house, even though he does have a walker. Also he asks about a bulge in the center of his abdomen that appeared several years ago. It does not bother him at all, no pain. Also he has been feeling more depressed lately and the Lexapro is not working like it used to. Lastly he has had a tickling cough in the throat which is dry for about a month.   Review of Systems  Constitutional: Negative.   Respiratory: Positive for cough.   Cardiovascular: Negative.   Gastrointestinal: Negative.   Neurological: Negative for dizziness and light-headedness.  Psychiatric/Behavioral: Positive for dysphoric mood and sleep disturbance. Negative for agitation, confusion and hallucinations. The patient is not nervous/anxious.        Objective:   Physical Exam  Constitutional: He is oriented to person, place, and time.  Walks with a walker   HENT:  Right Ear: External ear normal.  Left Ear: External ear normal.  Nose: Nose normal.  Mouth/Throat: Oropharynx is clear and moist.  Eyes: Conjunctivae are normal.  Neck: No thyromegaly present.  Cardiovascular: Normal rate, regular rhythm, normal heart sounds and intact distal pulses.  Pulmonary/Chest: Effort normal and breath sounds normal. No stridor. No respiratory distress. He has no wheezes. He has no rales.  Abdominal: Bowel sounds are normal. He exhibits no distension. There is no tenderness. There is no rebound and no guarding. No hernia.  He has a diastasis recti   Lymphadenopathy:    He has no cervical adenopathy.  Neurological: He is alert and oriented to person, place, and time.  Psychiatric: His behavior is normal. Thought content normal.  Depressed affect           Assessment & Plan:  He  has been falling and he has a balance problem. We will refer him to PT to improve. He was reassured that the diastasis recti is benign and does not require treatment. The cough is likely due to his Lisinopril so we will stop this and try Amlodipine 5 mg daily. For the depression we will increase the Lexapro to 20 mg daily.  Alysia Penna, MD

## 2017-12-27 ENCOUNTER — Ambulatory Visit (HOSPITAL_COMMUNITY): Payer: Medicare Other | Attending: Family Medicine | Admitting: Physical Therapy

## 2017-12-27 ENCOUNTER — Encounter (HOSPITAL_COMMUNITY): Payer: Self-pay | Admitting: Physical Therapy

## 2017-12-27 ENCOUNTER — Other Ambulatory Visit: Payer: Self-pay

## 2017-12-27 DIAGNOSIS — R2689 Other abnormalities of gait and mobility: Secondary | ICD-10-CM | POA: Insufficient documentation

## 2017-12-27 DIAGNOSIS — R296 Repeated falls: Secondary | ICD-10-CM | POA: Diagnosis present

## 2017-12-27 NOTE — Therapy (Signed)
[place], [place], [address] Phone: [phone]   Fax:  [phone]  Physical Therapy Evaluation  Patient Details  Name: Gabriel Love MRN: [id number] Date of Birth: 1940-07-23 Referring Provider (PT): Alysia Penna, MD   Encounter Date: 12/27/2017  PT End of Session - 12/27/17 1452    Visit Number  1    Number of Visits  17    Date for PT Re-Evaluation  02/21/18   Mini re-assess 01/24/18   Authorization Type  [employer] Medicare    Authorization Time Period  12/27/17 - 02/21/18    Authorization - Visit Number  1    Authorization - Number of Visits  10    PT Start Time  0823    PT Stop Time  0900    PT Time Calculation (min)  37 min    Equipment Utilized During Treatment  Gait belt    Activity Tolerance  Patient tolerated treatment well;Patient limited by fatigue    Behavior During Therapy  [facility] for tasks assessed/performed       Past Medical History:  Diagnosis Date  . Abdominal hernia   . Allergy   . Blood transfusion without reported diagnosis 12/2006   At [facility] with hip replacement - 2 units transfused  . Cancer ([place])    skin cancers  . Cataract   . Depression   . DJD (degenerative joint disease)    shoulders bilateral  . Environmental allergies   . Full dentures   . Hearing loss    bilateral hearing aids  . History of urinary tract infection   . Hyperlipidemia    under control  . Hypertension    "borderline"  . Nonspecific abnormal finding in stool contents   . Other general symptoms(780.99)   . Prostate hypertrophy    sees Dr. Gaynelle Arabian   . Urinary leakage     Past Surgical History:  Procedure Laterality Date  . CATARACT EXTRACTION W/PHACO Right 03/25/2014   Procedure: CATARACT EXTRACTION PHACO AND INTRAOCULAR LENS PLACEMENT; CDE:9.76;  Surgeon: Williams Che, MD;  Location: [facility];  Service: Ophthalmology;  Laterality: Right;  . CATARACT EXTRACTION W/PHACO Left 06/24/2014   Procedure: CATARACT  EXTRACTION PHACO AND INTRAOCULAR LENS PLACEMENT (IOC);  Surgeon: Williams Che, MD;  Location: [facility];  Service: Ophthalmology;  Laterality: Left;  CDE:8.45  . CIRCUMCISION N/A 03/26/2016   Procedure: CIRCUMCISION ADULT;  Surgeon: Carolan Clines, MD;  Location: [facility];  Service: Urology;  Laterality: N/A;  . CIRCUMCISION, NON-NEWBORN  1985  . COLONOSCOPY  06-19-13, 03/05/15   per Dr. Deatra Ina, mass at the ileocecal valve plus several polyps, pathology benign, repeat in one year, polyp 2016   . COLONOSCOPY WITH PROPOFOL N/A 07/29/2015   Procedure: COLONOSCOPY WITH PROPOFOL;  Surgeon: Manus Gunning, MD;  [facility];  Service: Gastroenterology;  Laterality: N/A;  . JOINT REPLACEMENT  12/2006   total left hip per Dr. Percell Miller   . LAPAROSCOPIC PARTIAL COLECTOMY N/A 08/07/2013   Procedure: LAPAROSCOPIC right PARTIAL COLECTOMY;  Surgeon: Leighton Ruff, MD;  Location: [facility];  Service: General;  Laterality: N/A;  . MULTIPLE TOOTH EXTRACTIONS    . PROSTATE SURGERY  03-29-11   had CTT per Dr. Gaynelle Arabian, in office    There were no vitals filed for this visit.   Subjective Assessment - 12/27/17 1425    Subjective  Patient reported that he has been losing his balance a lot for the past 2 years. He  reported that his feet get tangled up. Patient stated he fell las Sunday. He stated he isn't dizzy when he falls just loses track of his feet. Patient reported he had a left total hip replacement. Patient also had colon surgery. He stated that he used to use a cane, but after he has been falling recently his physician stated to use the walker only.     Patient is accompained by:  Family member   Patient's wife   Pertinent History  Frequent falls, left total hip replacment, HTN    Limitations  Standing;Walking    How long can you sit comfortably?  Not limited    How long can you stand comfortably?  10-15 minutes standing    How long can you walk comfortably?  5 minutes walking    Diagnostic  tests  MRI Lumbar spine 06/22/12 spinal stenosis    Patient Stated Goals  To have better balance    Currently in Pain?  No/denies         [facility] PT Assessment - 12/27/17 0001      Assessment   Medical Diagnosis  Frequent falls    Referring Provider (PT)  Alysia Penna, MD    Onset Date/Surgical Date  --   2 years   Next MD Visit  --   Unknown   Prior Therapy  After hip surgery      Precautions   Precautions  None      Restrictions   Weight Bearing Restrictions  No      Balance Screen   Has the patient fallen in the past 6 months  Yes    How many times?  2    Has the patient had a decrease in activity level because of a fear of falling?   Yes    Is the patient reluctant to leave their home because of a fear of falling?   Yes      [place]  Private residence    Living Arrangements  Spouse/significant other    Type of [place] to enter    Entrance Stairs-Number of Steps  2    [place] to live on main level with bedroom/bathroom    [place] - 4 wheels;Walker - 2 wheels;Kasandra Knudsen - single point      Prior Function   Level of Independence  Independent with household mobility with device;Independent with basic ADLs    Vocation  Retired      Charity fundraiser Status  Within Functional Limits for tasks assessed      Observation/Other Assessments   Focus on Therapeutic Outcomes (FOTO)   40% (60% limited)      Posture/Postural Control   Posture/Postural Control  Postural limitations    Postural Limitations  Rounded Shoulders;Forward head      ROM / Strength   AROM / PROM / Strength  Strength      Strength   Strength Assessment Site  Hip;Knee;Ankle    Right/Left Hip  Right;Left    Right Hip Flexion  4/5    Right Hip Extension  --   Test next session   Right Hip ABduction  4/5    Left Hip Flexion  4+/5    Left Hip Extension  --   Test next session   Left  Hip ABduction  4/5    Right/Left Knee  Right;Left    Right Knee Flexion  4+/5    Right Knee Extension  4+/5    Left Knee Flexion  4+/5    Left Knee Extension  4+/5    Right/Left Ankle  Right;Left    Right Ankle Dorsiflexion  2-/5    Left Ankle Dorsiflexion  2-/5      Ambulation/Gait   Gait Comments  Patient observed with decreased dorsiflexion with ambulation      Standardized Balance Assessment   Standardized Balance Assessment  Berg Balance Test;Timed Up and Go Test      Berg Balance Test   Sit to Stand  Able to stand  independently using hands    Standing Unsupported  Able to stand 30 seconds unsupported    Sitting with Back Unsupported but Feet Supported on Floor or Stool  Able to sit safely and securely 2 minutes    Stand to Sit  Controls descent by using hands    Transfers  Able to transfer safely, definite need of hands    Standing Unsupported with Eyes Closed  Needs help to keep from falling    Standing Ubsupported with Feet Together  Able to place feet together independently but unable to hold for 30 seconds    From Standing, Reach Forward with Outstretched Arm  Loses balance while trying/requires external support    From Standing Position, Pick up Object from Floor  Unable to try/needs assist to keep balance    From Standing Position, Turn to Look Behind Over each Shoulder  Needs supervision when turning    Turn 360 Degrees  Needs assistance while turning    Standing Unsupported, Alternately Place Feet on Step/Stool  Able to complete >2 steps/needs minimal assist    Standing Unsupported, One Foot in [employer] balance while stepping or standing    Standing on One Leg  Tries to lift leg/unable to hold 3 seconds but remains standing independently    Total Score  20      Timed Up and Go Test   Normal TUG (seconds)  24.03    TUG Comments  With 4-wheeled walker. Use of UEs to stand                Objective measurements completed on examination: See above  findings.              PT Education - 12/27/17 1428    Education Details  Discussed examination findings and plan of care with patient and patient's wife.    Person(s) Educated  Patient;Spouse    Methods  Explanation    Comprehension  Verbalized understanding       PT Short Term Goals - 12/27/17 1429      PT SHORT TERM GOAL #1   Title  Patient will report understanding and report regular compliance with HEP for improved balance, strength and functional mobility.     Time  4    Period  Weeks    Status  New    Target Date  01/24/18      PT SHORT TERM GOAL #2   Title  Patient will report ability to ambulate for 10 minutes in order to perform household chores with increased ease.     Time  4    Period  Weeks    Status  New    Target Date  01/24/18      PT SHORT TERM GOAL #3   Title  Patient will demonstrate improvement on the TUG of  5 seconds or greater with LRAD indicating improved dynamic balance and decreased risk of falls.     Time  4    Period  Weeks    Status  New    Target Date  01/24/18        PT Long Term Goals - 12/27/17 1441      PT LONG TERM GOAL #1   Title  Patient will demonstrate an improvement of 8 points on the Berg Balance Scale indicating improved balance and decreased risk of falling.     Time  8    Period  Weeks    Status  New    Target Date  02/21/18      PT LONG TERM GOAL #2   Title  Patient will demonstrate improvement on the TUG of 10 seconds or greater with LRAD indicating improved dynamic balance and decreased risk of falls.     Time  8    Period  Weeks    Status  New    Target Date  02/21/18      PT LONG TERM GOAL #3   Title  Patient will demonstrate improvement on FOTO of at least 10% indicating improved perceived functional mobility.     Time  8    Period  Weeks    Status  New    Target Date  02/21/18             Plan - 12/27/17 1620    Clinical Impression Statement  Patient is a 77 year old male who presented to  physical therapy with a history of reported repeated falls and decreased balance in the last couple years. Upon examination noted decreased strength in bilateral lower extremities with particular weakness in patient's ankles. In addition, noted decreased balance with the Berg Balance Scale in which patient scored 20/56 and on the TUG which patient performed in in 24.03 seconds with upper extremity assistance and with 4 wheeled walker. Patient would benefit from skilled physical therapy in order to address the abovementioned deficits to improve safety and decrease risk of falls as well as improve overall functional mobility.     History and Personal Factors relevant to plan of care:  Frequent falls, HTN, spinal stenosis    Clinical Presentation  Stable    Clinical Presentation due to:  Gay Filler, MMT, clinical judgement    Clinical Decision Making  Moderate    Rehab Potential  Fair    Clinical Impairments Affecting Rehab Potential  Positive: Support system; Negative: comorbidities    PT Frequency  2x / week    PT Duration  8 weeks    PT Treatment/Interventions  ADLs/Self Care Home Management;Aquatic Therapy;DME Instruction;Gait training;Stair training;Functional mobility training;Therapeutic activities;Therapeutic exercise;Balance training;Neuromuscular re-education;Patient/family education;Orthotic Fit/Training;Manual techniques;Passive range of motion;Dry needling;Energy conservation;Taping    PT Next Visit Plan  Review evaluation and goals, assess patient's tone/reflexes in lower extremity and ask ruling out questions regarding low back, assess hip extension strength in standing. Initiate exercises for lower extremity strengthening and balance activities at a support surface as able. And provide exercises for strengthening and balance to be performed at home.      PT Home Exercise Plan  Initiate at first treatment session    Consulted and Agree with Plan of Care  Patient;Family member/caregiver     Family Member Consulted  Patient's wife       Patient will benefit from skilled therapeutic intervention in order to improve the following deficits and impairments:  Abnormal gait,  Decreased balance, Decreased endurance, Decreased mobility, Difficulty walking, Obesity, Decreased activity tolerance, Decreased strength, Postural dysfunction, Pain  Visit Diagnosis: Repeated falls  Other abnormalities of gait and mobility     Problem List Patient Active Problem List   Diagnosis Date Noted  . Bilateral leg edema 02/07/2017  . Osteoarthritis 01/28/2017  . Urge and stress incontinence 01/28/2017  . History of colonic polyps   . Colon polyp 08/07/2013  . 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 Nonspecific abnormal finding in stool contents 05/29/2013  . Atherosclerotic peripheral vascular disease with intermittent claudication ([place]) 06/14/2012  . Spinal stenosis of lumbar region 06/14/2012  . 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 PHIMOSIS 01/30/2008  . YEAST BALANITIS 10/17/2007  . Hyperlipidemia 11/23/2006  . OBESITY NOS 11/23/2006  . Depression 11/23/2006  . Essential hypertension 11/23/2006  . HEMATURIA 11/23/2006  . BENIGN PROSTATIC HYPERTROPHY 11/23/2006  . PROSTATITIS NOS 11/23/2006  . OSTEOARTHRITIS, HIP, LEFT 11/23/2006   Clarene Critchley PT, DPT 4:28 PM, 12/27/17 [place] [address] [place], [place], [address] Phone: [phone]   Fax:  [phone]  Name: Gabriel Love MRN: [id number] Date of Birth: September 18, 1940

## 2017-12-28 ENCOUNTER — Ambulatory Visit (HOSPITAL_COMMUNITY): Payer: Medicare Other

## 2017-12-28 ENCOUNTER — Encounter (HOSPITAL_COMMUNITY): Payer: Self-pay

## 2017-12-28 ENCOUNTER — Other Ambulatory Visit: Payer: Self-pay

## 2017-12-28 DIAGNOSIS — R296 Repeated falls: Secondary | ICD-10-CM

## 2017-12-28 DIAGNOSIS — R2689 Other abnormalities of gait and mobility: Secondary | ICD-10-CM

## 2017-12-28 NOTE — Therapy (Signed)
[place], [place], [address] Phone: [phone]   Fax:  [phone]  Physical Therapy Treatment  Patient Details  Name: Gabriel Love MRN: [id number] Date of Birth: 1940/07/28 Referring Provider (PT): Alysia Penna, MD   Encounter Date: 12/28/2017  PT End of Session - 12/28/17 0858    Visit Number  2    Number of Visits  17    Date for PT Re-Evaluation  02/21/18   Mini re-assess 01/24/18   Authorization Type  [employer] Medicare    Authorization Time Period  12/27/17 - 02/21/18    Authorization - Visit Number  2    Authorization - Number of Visits  10    PT Start Time  0902    PT Stop Time  0945    PT Time Calculation (min)  43 min    Equipment Utilized During Treatment  Gait belt    Activity Tolerance  Patient tolerated treatment well;Patient limited by fatigue    Behavior During Therapy  [facility] for tasks assessed/performed       Past Medical History:  Diagnosis Date  . Abdominal hernia   . Allergy   . Blood transfusion without reported diagnosis 12/2006   At [facility] with hip replacement - 2 units transfused  . Cancer ([place])    skin cancers  . Cataract   . Depression   . DJD (degenerative joint disease)    shoulders bilateral  . Environmental allergies   . Full dentures   . Hearing loss    bilateral hearing aids  . History of urinary tract infection   . Hyperlipidemia    under control  . Hypertension    "borderline"  . Nonspecific abnormal finding in stool contents   . Other general symptoms(780.99)   . Prostate hypertrophy    sees Dr. Gaynelle Arabian   . Urinary leakage     Past Surgical History:  Procedure Laterality Date  . CATARACT EXTRACTION W/PHACO Right 03/25/2014   Procedure: CATARACT EXTRACTION PHACO AND INTRAOCULAR LENS PLACEMENT; CDE:9.76;  Surgeon: Williams Che, MD;  Location: [facility];  Service: Ophthalmology;  Laterality: Right;  . CATARACT EXTRACTION W/PHACO Left 06/24/2014   Procedure: CATARACT  EXTRACTION PHACO AND INTRAOCULAR LENS PLACEMENT (IOC);  Surgeon: Williams Che, MD;  Location: [facility];  Service: Ophthalmology;  Laterality: Left;  CDE:8.45  . CIRCUMCISION N/A 03/26/2016   Procedure: CIRCUMCISION ADULT;  Surgeon: Carolan Clines, MD;  [facility];  Service: Urology;  Laterality: N/A;  . CIRCUMCISION, NON-NEWBORN  1985  . COLONOSCOPY  06-19-13, 03/05/15   per Dr. Deatra Ina, mass at the ileocecal valve plus several polyps, pathology benign, repeat in one year, polyp 2016   . COLONOSCOPY WITH PROPOFOL N/A 07/29/2015   Procedure: COLONOSCOPY WITH PROPOFOL;  Surgeon: Manus Gunning, MD;  [facility];  Service: Gastroenterology;  Laterality: N/A;  . JOINT REPLACEMENT  12/2006   total left hip per Dr. Percell Miller   . LAPAROSCOPIC PARTIAL COLECTOMY N/A 08/07/2013   Procedure: LAPAROSCOPIC right PARTIAL COLECTOMY;  Surgeon: Leighton Ruff, MD;  Location: [facility];  Service: General;  Laterality: N/A;  . MULTIPLE TOOTH EXTRACTIONS    . PROSTATE SURGERY  03-29-11   had CTT per Dr. Gaynelle Arabian, in office    There were no vitals filed for this visit.  Subjective Assessment - 12/28/17 0857    Subjective  Patient reports he is doing well and is not sore or tired from yesterday. He denies pain this  date.     Patient is accompained by:  Family member   Patient's wife   Pertinent History  Frequent falls, left total hip replacment, HTN    Limitations  Standing;Walking    How long can you sit comfortably?  Not limited    How long can you stand comfortably?  10-15 minutes standing    How long can you walk comfortably?  5 minutes walking    Diagnostic tests  MRI Lumbar spine 06/22/12 spinal stenosis    Patient Stated Goals  To have better balance    Currently in Pain?  No/denies       [facility] Adult PT Treatment/Exercise - 12/28/17 0001      Exercises   Exercises  Knee/Hip      Knee/Hip Exercises: Standing   Hip Flexion  Stengthening;Both;1 set;10 reps;Knee bent;Limitations     Hip Flexion Limitations  red theraband    Hip Abduction  Both;1 set;15 reps;Knee straight;Limitations    Abduction Limitations  red theraband    Hip Extension  Both;1 set;15 reps;Knee bent    Other Standing Knee Exercises  step taps (4") 2x 20 tpas alt LE   1st set bil UE support; 2nd set 1HHA     Knee/Hip Exercises: Seated   Long Arc Quad  Strengthening;Both;1 set;15 reps;Weights    Long Arc Quad Weight  3 lbs.    Other Seated Knee/Hip Exercises  heel/toe raises: 2x 10 reps each with foot positioned anteriorly for toe raises and posteriorly for heel raises to improve ROM    Other Seated Knee/Hip Exercises  rockerboard: 1x 20 reps bil LE for DF/PF AROM    Sit to Sand  1 set;10 reps;with UE support;Other (comment)   from elevated surface      Balance Exercises - 12/28/17 1131      Balance Exercises: Standing   Tandem Stance  Eyes open;4 reps;15 secs   alt foot alignment      PT Education - 12/28/17 0858    Education Details  Educated on goals set at initial evaluation. Educated on exercises throughout and provided initial HEP.     Person(s) Educated  Patient    Methods  Explanation;Handout    Comprehension  Verbalized understanding;Returned demonstration       PT Short Term Goals - 12/28/17 0858      PT SHORT TERM GOAL #1   Title  Patient will report understanding and report regular compliance with HEP for improved balance, strength and functional mobility.     Time  4    Period  Weeks    Status  On-going      PT SHORT TERM GOAL #2   Title  Patient will report ability to ambulate for 10 minutes in order to perform household chores with increased ease.     Time  4    Period  Weeks    Status  On-going      PT SHORT TERM GOAL #3   Title  Patient will demonstrate improvement on the TUG of 5 seconds or greater with LRAD indicating improved dynamic balance and decreased risk of falls.     Time  4    Period  Weeks    Status  On-going        PT Long Term Goals -  12/28/17 0859      PT LONG TERM GOAL #1   Title  Patient will demonstrate an improvement of 8 points on the Berg Balance Scale indicating improved balance and decreased  risk of falling.     Time  8    Period  Weeks    Status  On-going      PT LONG TERM GOAL #2   Title  Patient will demonstrate improvement on the TUG of 10 seconds or greater with LRAD indicating improved dynamic balance and decreased risk of falls.     Time  8    Period  Weeks    Status  On-going      PT LONG TERM GOAL #3   Title  Patient will demonstrate improvement on FOTO of at least 10% indicating improved perceived functional mobility.     Time  8    Period  Weeks    Status  On-going         Plan - 12/28/17 0858    Clinical Impression Statement  Reviewed goals from initial evaluation with patient at start of session and completed further screening/testing per request of evaluating therapist. Patient denied night sweats, night pain, loss of bowel/bladder control, and bil LE paresthesia. DTR's are hypomobile throughout bil LE and also for UE with C7 testing. He initiated standing LE strengthening with focus on hip muscle groups and performed seated AROM for ankle strengthening. Patient required cues for trunk flexion with sit to stands and to deter use of momentum/knees against table to initiate stand. He will continue to benefit from skilled PT interventions to address impairments and progress functional mobility.    Rehab Potential  Fair    Clinical Impairments Affecting Rehab Potential  Positive: Support system; Negative: comorbidities    PT Frequency  2x / week    PT Duration  8 weeks    PT Treatment/Interventions  ADLs/Self Care Home Management;Aquatic Therapy;DME Instruction;Gait training;Stair training;Functional mobility training;Therapeutic activities;Therapeutic exercise;Balance training;Neuromuscular re-education;Patient/family education;Orthotic Fit/Training;Manual techniques;Passive range of motion;Dry  needling;Energy conservation;Taping    PT Next Visit Plan  Review evaluation and goals, assess patient's tone/reflexes in lower extremity and ask ruling out questions regarding low back. Initiate exercises for lower extremity strengthening and balance activities at a support surface as able. And provide exercises for strengthening and balance to be performed at home.      PT Home Exercise Plan  12/28/17 - hip ext/abd/flex at counter, heel/toe raises seated    Consulted and Agree with Plan of Care  Patient;Family member/caregiver    Family Member Consulted  Patient's wife       Patient will benefit from skilled therapeutic intervention in order to improve the following deficits and impairments:  Abnormal gait, Decreased balance, Decreased endurance, Decreased mobility, Difficulty walking, Obesity, Decreased activity tolerance, Decreased strength, Postural dysfunction, Pain  Visit Diagnosis: Repeated falls  Other abnormalities of gait and mobility     Problem List Patient Active Problem List   Diagnosis Date Noted  . Bilateral leg edema 02/07/2017  . Osteoarthritis 01/28/2017  . Urge and stress incontinence 01/28/2017  . History of colonic polyps   . Colon polyp 08/07/2013  . 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 Nonspecific abnormal finding in stool contents 05/29/2013  . Atherosclerotic peripheral vascular disease with intermittent claudication ([place]) 06/14/2012  . Spinal stenosis of lumbar region 06/14/2012  . 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 PHIMOSIS 01/30/2008  . YEAST BALANITIS 10/17/2007  . Hyperlipidemia 11/23/2006  . OBESITY NOS 11/23/2006  . Depression 11/23/2006  . Essential hypertension 11/23/2006  . HEMATURIA 11/23/2006  . BENIGN PROSTATIC HYPERTROPHY 11/23/2006  . PROSTATITIS NOS 11/23/2006  .  OSTEOARTHRITIS, HIP, LEFT 11/23/2006    Kipp Brood, PT, DPT Physical  Therapist with [facility]  12/28/2017 11:31 AM    [place] [address] [place], [place], [address] Phone: [phone]   Fax:  [phone]  Name: Gabriel Love MRN: [id number] Date of Birth: 1940/10/18

## 2018-01-02 ENCOUNTER — Ambulatory Visit: Payer: Medicare Other | Admitting: Family Medicine

## 2018-01-02 ENCOUNTER — Encounter: Payer: Self-pay | Admitting: Family Medicine

## 2018-01-02 VITALS — BP 144/102 | HR 101 | Temp 98.1°F | Wt 343.1 lb

## 2018-01-02 DIAGNOSIS — R600 Localized edema: Secondary | ICD-10-CM

## 2018-01-02 DIAGNOSIS — L03119 Cellulitis of unspecified part of limb: Secondary | ICD-10-CM | POA: Diagnosis not present

## 2018-01-02 DIAGNOSIS — I10 Essential (primary) hypertension: Secondary | ICD-10-CM | POA: Diagnosis not present

## 2018-01-02 MED ORDER — CEPHALEXIN 500 MG PO CAPS: 500.0000 mg | ORAL_CAPSULE | Freq: Four times a day (QID) | ORAL | 0 refills | Status: DC

## 2018-01-02 MED ORDER — METOPROLOL SUCCINATE ER 50 MG PO TB24: 50.0000 mg | ORAL_TABLET | Freq: Every day | ORAL | 3 refills | Status: DC

## 2018-01-02 NOTE — Progress Notes (Signed)
   Subjective:    Patient ID: YACINE GARRIGA, male    DOB: 08-27-1940, 77 y.o.   MRN: [id number]  HPI Here for the onset yesterday of swelling, redness, and pain in both lower legs. He has had slight leg edema for years but never to this point. We saw him for HTN about a week ago and we added Amlodipine to his Lasix. This has not really affected the BP yet but he quickly began to notice more swelling in the legs and feet than he ever has before. No chest pain or SOB. Then the redness and pain began.    Review of Systems  Constitutional: Negative.   Respiratory: Negative.   Cardiovascular: Positive for leg swelling. Negative for chest pain and palpitations.  Skin: Positive for color change.  Neurological: Negative.        Objective:   Physical Exam  Constitutional: He is oriented to person, place, and time.  Morbidly obese, walks with a walker   Cardiovascular: Normal rate, regular rhythm, normal heart sounds and intact distal pulses.  Pulmonary/Chest: Effort normal and breath sounds normal. No stridor. No respiratory distress. He has no wheezes. He has no rales.  Musculoskeletal:  Both lower legs show 4+ edema. They are red and warm and tender   Neurological: He is alert and oriented to person, place, and time.          Assessment & Plan:  His HTN is not well controlled, and the Amlodipine has contributed to his leg edema. We will stop this and try Metoprolol succinate instead. He will stay on Lasix 40 mg daily. Elevate the legs. Treat the cellulitis with Keflex for 10 days. Recheck in 10 days. Alysia Penna, MD

## 2018-01-03 ENCOUNTER — Telehealth (HOSPITAL_COMMUNITY): Payer: Self-pay | Admitting: Family Medicine

## 2018-01-03 ENCOUNTER — Telehealth: Payer: Self-pay | Admitting: Family Medicine

## 2018-01-03 ENCOUNTER — Ambulatory Visit (HOSPITAL_COMMUNITY): Payer: Medicare Other

## 2018-01-03 NOTE — Telephone Encounter (Signed)
Wife states pt has decided to take this week off from therapy, because his feet are so swollen. Wife states he is keeping his legs elevated, and this seems to be helping. She did not think it would hurt him to miss this week, and to keep feet elevated and take it easy. She is aware of Dr Sarajane Jews message as well.

## 2018-01-03 NOTE — Telephone Encounter (Signed)
01/03/18  caller left a message that patient has been to the dr and has swelling, redness in feet, has cellulitis and won't be here today

## 2018-01-03 NOTE — Telephone Encounter (Signed)
[id number] x1 

## 2018-01-03 NOTE — Telephone Encounter (Signed)
Dr. Fry please advise. Thanks  

## 2018-01-03 NOTE — Telephone Encounter (Signed)
He can still go to PT, but when he is finished he should sit down and elevate his legs

## 2018-01-03 NOTE — Telephone Encounter (Signed)
Copied from [place] [id number]. Topic: General - Inquiry >> Jan 03, 2018  8:07 AM Margot Ables wrote: Reason for CRM: Pt wife called about swelling in pts feet that he was seen for yesterday - should pt continue going to PT or stop? Pt supposed to go today and Thursday (every Tuesday & Thursday). Please advise.

## 2018-01-05 ENCOUNTER — Telehealth (HOSPITAL_COMMUNITY): Payer: Self-pay | Admitting: Family Medicine

## 2018-01-05 ENCOUNTER — Ambulatory Visit (HOSPITAL_COMMUNITY): Payer: Medicare Other

## 2018-01-05 NOTE — Telephone Encounter (Signed)
01/05/18  wife called and cx said that he was still having issues with his legs... will see [place] at the next appt.

## 2018-01-10 ENCOUNTER — Encounter (HOSPITAL_COMMUNITY): Payer: Self-pay

## 2018-01-10 ENCOUNTER — Ambulatory Visit (HOSPITAL_COMMUNITY): Payer: Medicare Other

## 2018-01-10 DIAGNOSIS — R2689 Other abnormalities of gait and mobility: Secondary | ICD-10-CM

## 2018-01-10 DIAGNOSIS — R296 Repeated falls: Secondary | ICD-10-CM | POA: Diagnosis not present

## 2018-01-10 NOTE — Therapy (Signed)
[place], [place], [address] Phone: [phone]   Fax:  [phone]  Physical Therapy Treatment  Patient Details  Name: Gabriel Love MRN: [id number] Date of Birth: 1940-11-14 Referring Provider (PT): Alysia Penna, MD   Encounter Date: 01/10/2018  PT End of Session - 01/10/18 0940    Visit Number  3    Number of Visits  17    Date for PT Re-Evaluation  02/21/18   Mini re-assess 01/24/18   Authorization Type  [employer] Medicare    Authorization Time Period  12/27/17 - 02/21/18    Authorization - Visit Number  3    Authorization - Number of Visits  10    PT Start Time  0940    PT Stop Time  1021    PT Time Calculation (min)  41 min    Equipment Utilized During Treatment  Gait belt    Activity Tolerance  Patient tolerated treatment well;Patient limited by fatigue    Behavior During Therapy  [facility] for tasks assessed/performed       Past Medical History:  Diagnosis Date  . Abdominal hernia   . Allergy   . Blood transfusion without reported diagnosis 12/2006   At [facility] with hip replacement - 2 units transfused  . Cancer ([place])    skin cancers  . Cataract   . Depression   . DJD (degenerative joint disease)    shoulders bilateral  . Environmental allergies   . Full dentures   . Hearing loss    bilateral hearing aids  . History of urinary tract infection   . Hyperlipidemia    under control  . Hypertension    "borderline"  . Nonspecific abnormal finding in stool contents   . Other general symptoms(780.99)   . Prostate hypertrophy    sees Dr. Gaynelle Arabian   . Urinary leakage     Past Surgical History:  Procedure Laterality Date  . CATARACT EXTRACTION W/PHACO Right 03/25/2014   Procedure: CATARACT EXTRACTION PHACO AND INTRAOCULAR LENS PLACEMENT; CDE:9.76;  Surgeon: Williams Che, MD;  Location: [facility];  Service: Ophthalmology;  Laterality: Right;  . CATARACT EXTRACTION W/PHACO Left 06/24/2014   Procedure: CATARACT  EXTRACTION PHACO AND INTRAOCULAR LENS PLACEMENT (IOC);  Surgeon: Williams Che, MD;  Location: [facility];  Service: Ophthalmology;  Laterality: Left;  CDE:8.45  . CIRCUMCISION N/A 03/26/2016   Procedure: CIRCUMCISION ADULT;  Surgeon: Carolan Clines, MD;  Location: [facility];  Service: Urology;  Laterality: N/A;  . CIRCUMCISION, NON-NEWBORN  1985  . COLONOSCOPY  06-19-13, 03/05/15   per Dr. Deatra Ina, mass at the ileocecal valve plus several polyps, pathology benign, repeat in one year, polyp 2016   . COLONOSCOPY WITH PROPOFOL N/A 07/29/2015   Procedure: COLONOSCOPY WITH PROPOFOL;  Surgeon: Manus Gunning, MD;  [facility];  Service: Gastroenterology;  Laterality: N/A;  . JOINT REPLACEMENT  12/2006   total left hip per Dr. Percell Miller   . LAPAROSCOPIC PARTIAL COLECTOMY N/A 08/07/2013   Procedure: LAPAROSCOPIC right PARTIAL COLECTOMY;  Surgeon: Leighton Ruff, MD;  Location: [facility];  Service: General;  Laterality: N/A;  . MULTIPLE TOOTH EXTRACTIONS    . PROSTATE SURGERY  03-29-11   had CTT per Dr. Gaynelle Arabian, in office    There were no vitals filed for this visit.  Subjective Assessment - 01/10/18 0942    Subjective  Pt states that he had cellulitis bad in his legs which is why he wasn't here last week.  He is feeling better now. No pain currently.     Patient is accompained by:  Family member   Patient's wife   Pertinent History  Frequent falls, left total hip replacment, HTN    Limitations  Standing;Walking    How long can you sit comfortably?  Not limited    How long can you stand comfortably?  10-15 minutes standing    How long can you walk comfortably?  5 minutes walking    Diagnostic tests  MRI Lumbar spine 06/22/12 spinal stenosis    Patient Stated Goals  To have better balance    Currently in Pain?  No/denies           [facility] Adult PT Treatment/Exercise - 01/10/18 0001      Exercises   Exercises  Knee/Hip      Knee/Hip Exercises: Standing   Forward Step Up  Both;10  reps;Step Height: 4";Hand Hold: 2    Functional Squat  1 set;10 reps    Functional Squat Limitations  BUE on // bars    Other Standing Knee Exercises  sidestepping in // bars x2RT (UE support)      Knee/Hip Exercises: Seated   Long Arc Quad  Strengthening;Both;2 sets;10 reps    Long Arc Quad Weight  3 lbs.    Marching  Both;15 reps    Marching Weights  3 lbs.    Sit to Sand  2 sets;10 reps   1UE support     Balance Exercises - 01/10/18 1002      Balance Exercises: Standing   Standing Eyes Opened  Narrow base of support (BOS);Foam/compliant surface;3 reps;10 secs    Standing Eyes Closed  Wide (BOA);Foam/compliant surface;3 reps;10 secs    Tandem Stance  Eyes open;Upper extremity support 1;5 reps;10 secs   1 fingertip A   Partial Tandem Stance  Eyes open;Upper extremity support 2   FFE 4" step, +R/L, up/down head turns x10 each            PT Education - 01/10/18 0941    Education Details  exercise technique, continue HEP    Person(s) Educated  Patient    Methods  Explanation;Demonstration    Comprehension  Verbalized understanding;Returned demonstration       PT Short Term Goals - 12/28/17 0858      PT SHORT TERM GOAL #1   Title  Patient will report understanding and report regular compliance with HEP for improved balance, strength and functional mobility.     Time  4    Period  Weeks    Status  On-going      PT SHORT TERM GOAL #2   Title  Patient will report ability to ambulate for 10 minutes in order to perform household chores with increased ease.     Time  4    Period  Weeks    Status  On-going      PT SHORT TERM GOAL #3   Title  Patient will demonstrate improvement on the TUG of 5 seconds or greater with LRAD indicating improved dynamic balance and decreased risk of falls.     Time  4    Period  Weeks    Status  On-going        PT Long Term Goals - 12/28/17 0859      PT LONG TERM GOAL #1   Title  Patient will demonstrate an improvement of 8 points  on the Berg Balance Scale indicating improved balance and decreased risk of falling.  Time  8    Period  Weeks    Status  On-going      PT LONG TERM GOAL #2   Title  Patient will demonstrate improvement on the TUG of 10 seconds or greater with LRAD indicating improved dynamic balance and decreased risk of falls.     Time  8    Period  Weeks    Status  On-going      PT LONG TERM GOAL #3   Title  Patient will demonstrate improvement on FOTO of at least 10% indicating improved perceived functional mobility.     Time  8    Period  Weeks    Status  On-going            Plan - 01/10/18 1019    Clinical Impression Statement  Pt returns to therapy for the first time since 12/28/17 after dealing with cellulitis in BLE. He feels much better since getting that under control so today's session continued with established POC focusing on strengthening and balance. Pt requiring frequent rest breaks due to fatigue. Pt generally unsteady during balance activities and required min guard - A for balance, with noted increased difficulty with RLE back. Pt with a few episodes of R knee buckling due to weakness but pt able to maintain balance independently. No pain at EOS, just mm fatigue. Educated him on likelihood of DOMS and he verbalized understanding. continue as planned, progressing as able.    Rehab Potential  Fair    Clinical Impairments Affecting Rehab Potential  Positive: Support system; Negative: comorbidities    PT Frequency  2x / week    PT Duration  8 weeks    PT Treatment/Interventions  ADLs/Self Care Home Management;Aquatic Therapy;DME Instruction;Gait training;Stair training;Functional mobility training;Therapeutic activities;Therapeutic exercise;Balance training;Neuromuscular re-education;Patient/family education;Orthotic Fit/Training;Manual techniques;Passive range of motion;Dry needling;Energy conservation;Taping    PT Next Visit Plan  continue BLE, functional strengthening and balance  activities at a support surface as able     PT Home Exercise Plan  12/28/17 - hip ext/abd/flex at counter, heel/toe raises seated    Consulted and Agree with Plan of Care  Patient       Patient will benefit from skilled therapeutic intervention in order to improve the following deficits and impairments:  Abnormal gait, Decreased balance, Decreased endurance, Decreased mobility, Difficulty walking, Obesity, Decreased activity tolerance, Decreased strength, Postural dysfunction, Pain  Visit Diagnosis: Repeated falls  Other abnormalities of gait and mobility     Problem List Patient Active Problem List   Diagnosis Date Noted  . Bilateral leg edema 02/07/2017  . Osteoarthritis 01/28/2017  . Urge and stress incontinence 01/28/2017  . History of colonic polyps   . Colon polyp 08/07/2013  . 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 Nonspecific abnormal finding in stool contents 05/29/2013  . Atherosclerotic peripheral vascular disease with intermittent claudication ([place]) 06/14/2012  . Spinal stenosis of lumbar region 06/14/2012  . 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 PHIMOSIS 01/30/2008  . YEAST BALANITIS 10/17/2007  . Hyperlipidemia 11/23/2006  . OBESITY NOS 11/23/2006  . Depression 11/23/2006  . Essential hypertension 11/23/2006  . HEMATURIA 11/23/2006  . BENIGN PROSTATIC HYPERTROPHY 11/23/2006  . PROSTATITIS NOS 11/23/2006  . OSTEOARTHRITIS, HIP, LEFT 11/23/2006       Geraldine Solar PT, DPT   [place] [address] [place], [place], [address] Phone: [phone]   Fax:  [phone]  Name: Gabriel Love MRN: [id number] Date of Birth: [birth year]

## 2018-01-12 ENCOUNTER — Encounter (HOSPITAL_COMMUNITY): Payer: Self-pay | Admitting: Physical Therapy

## 2018-01-12 ENCOUNTER — Ambulatory Visit (HOSPITAL_COMMUNITY): Payer: Medicare Other | Admitting: Physical Therapy

## 2018-01-12 DIAGNOSIS — R296 Repeated falls: Secondary | ICD-10-CM

## 2018-01-12 DIAGNOSIS — R2689 Other abnormalities of gait and mobility: Secondary | ICD-10-CM

## 2018-01-12 NOTE — Therapy (Signed)
[place], [place], [address] Phone: [phone]   Fax:  [phone]  Physical Therapy Treatment  Patient Details  Name: Gabriel Love MRN: [id number] Date of Birth: Jun 03, 1940 Referring Provider (PT): Alysia Penna, MD   Encounter Date: 01/12/2018  PT End of Session - 01/12/18 1058    Visit Number  4    Number of Visits  17    Date for PT Re-Evaluation  02/21/18   Mini re-assess 01/24/18   Authorization Type  [employer] Medicare    Authorization Time Period  12/27/17 - 02/21/18    Authorization - Visit Number  4    Authorization - Number of Visits  10    PT Start Time  1031    PT Stop Time  1109    PT Time Calculation (min)  38 min    Equipment Utilized During Treatment  Gait belt    Activity Tolerance  Patient tolerated treatment well;Patient limited by fatigue    Behavior During Therapy  [facility] for tasks assessed/performed       Past Medical History:  Diagnosis Date  . Abdominal hernia   . Allergy   . Blood transfusion without reported diagnosis 12/2006   At [facility] with hip replacement - 2 units transfused  . Cancer ([place])    skin cancers  . Cataract   . Depression   . DJD (degenerative joint disease)    shoulders bilateral  . Environmental allergies   . Full dentures   . Hearing loss    bilateral hearing aids  . History of urinary tract infection   . Hyperlipidemia    under control  . Hypertension    "borderline"  . Nonspecific abnormal finding in stool contents   . Other general symptoms(780.99)   . Prostate hypertrophy    sees Dr. Gaynelle Arabian   . Urinary leakage     Past Surgical History:  Procedure Laterality Date  . CATARACT EXTRACTION W/PHACO Right 03/25/2014   Procedure: CATARACT EXTRACTION PHACO AND INTRAOCULAR LENS PLACEMENT; CDE:9.76;  Surgeon: Williams Che, MD;  Location: [facility];  Service: Ophthalmology;  Laterality: Right;  . CATARACT EXTRACTION W/PHACO Left 06/24/2014   Procedure: CATARACT  EXTRACTION PHACO AND INTRAOCULAR LENS PLACEMENT (IOC);  Surgeon: Williams Che, MD;  Location: [facility];  Service: Ophthalmology;  Laterality: Left;  CDE:8.45  . CIRCUMCISION N/A 03/26/2016   Procedure: CIRCUMCISION ADULT;  Surgeon: Carolan Clines, MD;  Location: [facility];  Service: Urology;  Laterality: N/A;  . CIRCUMCISION, NON-NEWBORN  1985  . COLONOSCOPY  06-19-13, 03/05/15   per Dr. Deatra Ina, mass at the ileocecal valve plus several polyps, pathology benign, repeat in one year, polyp 2016   . COLONOSCOPY WITH PROPOFOL N/A 07/29/2015   Procedure: COLONOSCOPY WITH PROPOFOL;  Surgeon: Manus Gunning, MD;  [facility];  Service: Gastroenterology;  Laterality: N/A;  . JOINT REPLACEMENT  12/2006   total left hip per Dr. Percell Miller   . LAPAROSCOPIC PARTIAL COLECTOMY N/A 08/07/2013   Procedure: LAPAROSCOPIC right PARTIAL COLECTOMY;  Surgeon: Leighton Ruff, MD;  Location: [facility];  Service: General;  Laterality: N/A;  . MULTIPLE TOOTH EXTRACTIONS    . PROSTATE SURGERY  03-29-11   had CTT per Dr. Gaynelle Arabian, in office    There were no vitals filed for this visit.  Subjective Assessment - 01/12/18 1035    Subjective  Patient denied any pain and reported just a little soreness. Patient stated he has been doing his exercises  at home.     Patient is accompained by:  Family member   Patient's wife   Pertinent History  Frequent falls, left total hip replacment, HTN    Limitations  Standing;Walking    How long can you sit comfortably?  Not limited    How long can you stand comfortably?  10-15 minutes standing    How long can you walk comfortably?  5 minutes walking    Diagnostic tests  MRI Lumbar spine 06/22/12 spinal stenosis    Patient Stated Goals  To have better balance    Currently in Pain?  No/denies                       [facility] Adult PT Treatment/Exercise - 01/12/18 0001      Knee/Hip Exercises: Standing   Forward Step Up  Both;10 reps;Step Height: 4";Hand Hold: 2    VCs to look up   Functional Squat  1 set;10 reps    Functional Squat Limitations  BUE on // bars      Knee/Hip Exercises: Seated   Long Arc Quad  Strengthening;Both;2 sets;10 reps    Long Arc Quad Weight  3 lbs.    Other Seated Knee/Hip Exercises  Marching over 6'' hurdle x 10 each LE with intermittent difficulty with clearance    Marching  Both;15 reps    Marching Weights  3 lbs.    Sit to Sand  2 sets;10 reps   1 UE support. VCs to scoot forward in chair         Balance Exercises - 01/12/18 1048      Balance Exercises: Standing   Standing Eyes Opened  Narrow base of support (BOS);Foam/compliant surface;3 reps;10 secs    Standing Eyes Closed  Wide (BOA);Foam/compliant surface;3 reps;10 secs    Tandem Stance  Eyes open;Upper extremity support 1;5 reps;10 secs   1 Fingertip Assist   Other Standing Exercises  Tapping foot toward colored cone x 10 each LE with bilateral HHA          PT Short Term Goals - 12/28/17 0858      PT SHORT TERM GOAL #1   Title  Patient will report understanding and report regular compliance with HEP for improved balance, strength and functional mobility.     Time  4    Period  Weeks    Status  On-going      PT SHORT TERM GOAL #2   Title  Patient will report ability to ambulate for 10 minutes in order to perform household chores with increased ease.     Time  4    Period  Weeks    Status  On-going      PT SHORT TERM GOAL #3   Title  Patient will demonstrate improvement on the TUG of 5 seconds or greater with LRAD indicating improved dynamic balance and decreased risk of falls.     Time  4    Period  Weeks    Status  On-going        PT Long Term Goals - 12/28/17 0859      PT LONG TERM GOAL #1   Title  Patient will demonstrate an improvement of 8 points on the Berg Balance Scale indicating improved balance and decreased risk of falling.     Time  8    Period  Weeks    Status  On-going      PT LONG TERM GOAL #2   Title  Patient will  demonstrate improvement on the TUG of 10 seconds or greater with LRAD indicating improved dynamic balance and decreased risk of falls.     Time  8    Period  Weeks    Status  On-going      PT LONG TERM GOAL #3   Title  Patient will demonstrate improvement on FOTO of at least 10% indicating improved perceived functional mobility.     Time  8    Period  Weeks    Status  On-going            Plan - 01/12/18 1112    Clinical Impression Statement  This session continued with established plan of care with focus on balance. This session added standing foot taps to colored cones to improve proprioception and foot placement. This session also added seated marching over 6'' hurdle with noted difficulty with foot clearance for this. Patient would benefit from continued skilled physical therapy in order to continue progressing patient towards functional goals.     Rehab Potential  Fair    Clinical Impairments Affecting Rehab Potential  Positive: Support system; Negative: comorbidities    PT Frequency  2x / week    PT Duration  8 weeks    PT Treatment/Interventions  ADLs/Self Care Home Management;Aquatic Therapy;DME Instruction;Gait training;Stair training;Functional mobility training;Therapeutic activities;Therapeutic exercise;Balance training;Neuromuscular re-education;Patient/family education;Orthotic Fit/Training;Manual techniques;Passive range of motion;Dry needling;Energy conservation;Taping    PT Next Visit Plan  continue BLE, functional strengthening and balance activities at a support surface as able     PT Home Exercise Plan  12/28/17 - hip ext/abd/flex at counter, heel/toe raises seated    Consulted and Agree with Plan of Care  Patient       Patient will benefit from skilled therapeutic intervention in order to improve the following deficits and impairments:  Abnormal gait, Decreased balance, Decreased endurance, Decreased mobility, Difficulty walking, Obesity, Decreased activity  tolerance, Decreased strength, Postural dysfunction, Pain  Visit Diagnosis: Repeated falls  Other abnormalities of gait and mobility     Problem List Patient Active Problem List   Diagnosis Date Noted  . Bilateral leg edema 02/07/2017  . Osteoarthritis 01/28/2017  . Urge and stress incontinence 01/28/2017  . History of colonic polyps   . Colon polyp 08/07/2013  . 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 Nonspecific abnormal finding in stool contents 05/29/2013  . Atherosclerotic peripheral vascular disease with intermittent claudication ([place]) 06/14/2012  . Spinal stenosis of lumbar region 06/14/2012  . 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 PHIMOSIS 01/30/2008  . YEAST BALANITIS 10/17/2007  . Hyperlipidemia 11/23/2006  . OBESITY NOS 11/23/2006  . Depression 11/23/2006  . Essential hypertension 11/23/2006  . HEMATURIA 11/23/2006  . BENIGN PROSTATIC HYPERTROPHY 11/23/2006  . PROSTATITIS NOS 11/23/2006  . OSTEOARTHRITIS, HIP, LEFT 11/23/2006   Clarene Critchley PT, DPT 11:13 AM, 01/12/18 [place] [address] [place], [place], [address] Phone: [phone]   Fax:  [phone]  Name: MARKEES CARNS MRN: [id number] Date of Birth: [birth year]

## 2018-01-17 ENCOUNTER — Ambulatory Visit (HOSPITAL_COMMUNITY): Payer: Medicare Other | Admitting: Physical Therapy

## 2018-01-17 ENCOUNTER — Encounter (HOSPITAL_COMMUNITY): Payer: Self-pay | Admitting: Physical Therapy

## 2018-01-17 DIAGNOSIS — R296 Repeated falls: Secondary | ICD-10-CM | POA: Diagnosis not present

## 2018-01-17 DIAGNOSIS — R2689 Other abnormalities of gait and mobility: Secondary | ICD-10-CM

## 2018-01-17 NOTE — Therapy (Signed)
[place], [place], [address] Phone: [phone]   Fax:  [phone]  Physical Therapy Treatment  Patient Details  Name: Gabriel Love MRN: [id number] Date of Birth: 1941/03/09 Referring Provider (PT): Alysia Penna, MD   Encounter Date: 01/17/2018  PT End of Session - 01/17/18 1151    Visit Number  5    Number of Visits  17    Date for PT Re-Evaluation  02/21/18   Mini re-assess 01/24/18   Authorization Type  [employer] Medicare    Authorization Time Period  12/27/17 - 02/21/18    Authorization - Visit Number  5    Authorization - Number of Visits  10    PT Start Time  2993    PT Stop Time  1155    PT Time Calculation (min)  38 min    Equipment Utilized During Treatment  Gait belt    Activity Tolerance  Patient tolerated treatment well;Patient limited by fatigue    Behavior During Therapy  [facility] for tasks assessed/performed       Past Medical History:  Diagnosis Date  . Abdominal hernia   . Allergy   . Blood transfusion without reported diagnosis 12/2006   At [facility] with hip replacement - 2 units transfused  . Cancer ([place])    skin cancers  . Cataract   . Depression   . DJD (degenerative joint disease)    shoulders bilateral  . Environmental allergies   . Full dentures   . Hearing loss    bilateral hearing aids  . History of urinary tract infection   . Hyperlipidemia    under control  . Hypertension    "borderline"  . Nonspecific abnormal finding in stool contents   . Other general symptoms(780.99)   . Prostate hypertrophy    sees Dr. Gaynelle Arabian   . Urinary leakage     Past Surgical History:  Procedure Laterality Date  . CATARACT EXTRACTION W/PHACO Right 03/25/2014   Procedure: CATARACT EXTRACTION PHACO AND INTRAOCULAR LENS PLACEMENT; CDE:9.76;  Surgeon: Williams Che, MD;  Location: [facility];  Service: Ophthalmology;  Laterality: Right;  . CATARACT EXTRACTION W/PHACO Left 06/24/2014   Procedure: CATARACT  EXTRACTION PHACO AND INTRAOCULAR LENS PLACEMENT (IOC);  Surgeon: Williams Che, MD;  Location: [facility];  Service: Ophthalmology;  Laterality: Left;  CDE:8.45  . CIRCUMCISION N/A 03/26/2016   Procedure: CIRCUMCISION ADULT;  Surgeon: Carolan Clines, MD;  Location: [facility];  Service: Urology;  Laterality: N/A;  . CIRCUMCISION, NON-NEWBORN  1985  . COLONOSCOPY  06-19-13, 03/05/15   per Dr. Deatra Ina, mass at the ileocecal valve plus several polyps, pathology benign, repeat in one year, polyp 2016   . COLONOSCOPY WITH PROPOFOL N/A 07/29/2015   Procedure: COLONOSCOPY WITH PROPOFOL;  Surgeon: Manus Gunning, MD;  [facility]: [facility];  Service: Gastroenterology;  Laterality: N/A;  . JOINT REPLACEMENT  12/2006   total left hip per Dr. Percell Miller   . LAPAROSCOPIC PARTIAL COLECTOMY N/A 08/07/2013   Procedure: LAPAROSCOPIC right PARTIAL COLECTOMY;  Surgeon: Leighton Ruff, MD;  Location: [facility];  Service: General;  Laterality: N/A;  . MULTIPLE TOOTH EXTRACTIONS    . PROSTATE SURGERY  03-29-11   had CTT per Dr. Gaynelle Arabian, in office    There were no vitals filed for this visit.  Subjective Assessment - 01/17/18 1130    Subjective  Patient stated he had a fall at home over the carpet on Saturday. He said he was able  to walk immediately afterwards. He reported that he isn't having any pain currently.     Patient is accompained by:  Family member   Patient's wife   Pertinent History  Frequent falls, left total hip replacment, HTN    Limitations  Standing;Walking    How long can you sit comfortably?  Not limited    How long can you stand comfortably?  10-15 minutes standing    How long can you walk comfortably?  5 minutes walking    Diagnostic tests  MRI Lumbar spine 06/22/12 spinal stenosis    Patient Stated Goals  To have better balance    Currently in Pain?  No/denies                       [facility] Adult PT Treatment/Exercise - 01/17/18 0001      Knee/Hip Exercises: Standing   Other  Standing Knee Exercises  Stepping strategy posterior x 10 each lower extremity      Knee/Hip Exercises: Seated   Long Arc Quad  Strengthening;Both;2 sets;10 reps    Long Arc Quad Weight  3 lbs.    Other Seated Knee/Hip Exercises  Marching over 6'' hurdle x 10 each LE with intermittent difficulty with clearance    Marching  Both;15 reps    Marching Weights  3 lbs.          Balance Exercises - 01/17/18 1145      Balance Exercises: Standing   Standing Eyes Opened  Narrow base of support (BOS);Foam/compliant surface;3 reps;10 secs    Standing Eyes Closed  Wide (BOA);Foam/compliant surface;3 reps;10 secs    Tandem Stance  Eyes open;Upper extremity support 1;5 reps;10 secs    Other Standing Exercises  Tapping foot toward colored cone x 10 each LE with bilateral HHA        PT Education - 01/17/18 1133    Education Details  Discussed ways to reduce falls and improve safety at home.     Person(s) Educated  Patient    Methods  Explanation;Handout    Comprehension  Verbalized understanding       PT Short Term Goals - 12/28/17 0858      PT SHORT TERM GOAL #1   Title  Patient will report understanding and report regular compliance with HEP for improved balance, strength and functional mobility.     Time  4    Period  Weeks    Status  On-going      PT SHORT TERM GOAL #2   Title  Patient will report ability to ambulate for 10 minutes in order to perform household chores with increased ease.     Time  4    Period  Weeks    Status  On-going      PT SHORT TERM GOAL #3   Title  Patient will demonstrate improvement on the TUG of 5 seconds or greater with LRAD indicating improved dynamic balance and decreased risk of falls.     Time  4    Period  Weeks    Status  On-going        PT Long Term Goals - 12/28/17 0859      PT LONG TERM GOAL #1   Title  Patient will demonstrate an improvement of 8 points on the Berg Balance Scale indicating improved balance and decreased risk of  falling.     Time  8    Period  Weeks    Status  On-going  PT LONG TERM GOAL #2   Title  Patient will demonstrate improvement on the TUG of 10 seconds or greater with LRAD indicating improved dynamic balance and decreased risk of falls.     Time  8    Period  Weeks    Status  On-going      PT LONG TERM GOAL #3   Title  Patient will demonstrate improvement on FOTO of at least 10% indicating improved perceived functional mobility.     Time  8    Period  Weeks    Status  On-going            Plan - 01/17/18 1200    Clinical Impression Statement  This session patient reported that he had a fall on Saturday. Began session with education on tips and safety information to prevent falls in the home. Then continued with exercises to work on proprioception, balance, and lower extremity strength. This session had patient perform posterior stepping strategy to improve safety when patient has a loss of balance. Patient required intermittent upper extremity assistance when performing stepping strategy. Plan to continue with progression of exercises to reach functional goals.     Rehab Potential  Fair    Clinical Impairments Affecting Rehab Potential  Positive: Support system; Negative: comorbidities    PT Frequency  2x / week    PT Duration  8 weeks    PT Treatment/Interventions  ADLs/Self Care Home Management;Aquatic Therapy;DME Instruction;Gait training;Stair training;Functional mobility training;Therapeutic activities;Therapeutic exercise;Balance training;Neuromuscular re-education;Patient/family education;Orthotic Fit/Training;Manual techniques;Passive range of motion;Dry needling;Energy conservation;Taping    PT Next Visit Plan  Progress to more dynamic balance and gait activities as able. continue BLE, functional strengthening and balance activities at a support surface as able     PT Home Exercise Plan  12/28/17 - hip ext/abd/flex at counter, heel/toe raises seated    Consulted and Agree  with Plan of Care  Patient       Patient will benefit from skilled therapeutic intervention in order to improve the following deficits and impairments:  Abnormal gait, Decreased balance, Decreased endurance, Decreased mobility, Difficulty walking, Obesity, Decreased activity tolerance, Decreased strength, Postural dysfunction, Pain  Visit Diagnosis: Repeated falls  Other abnormalities of gait and mobility     Problem List Patient Active Problem List   Diagnosis Date Noted  . Bilateral leg edema 02/07/2017  . Osteoarthritis 01/28/2017  . Urge and stress incontinence 01/28/2017  . History of colonic polyps   . Colon polyp 08/07/2013  . 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 Nonspecific abnormal finding in stool contents 05/29/2013  . Atherosclerotic peripheral vascular disease with intermittent claudication ([place]) 06/14/2012  . Spinal stenosis of lumbar region 06/14/2012  . 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 PHIMOSIS 01/30/2008  . YEAST BALANITIS 10/17/2007  . Hyperlipidemia 11/23/2006  . OBESITY NOS 11/23/2006  . Depression 11/23/2006  . Essential hypertension 11/23/2006  . HEMATURIA 11/23/2006  . BENIGN PROSTATIC HYPERTROPHY 11/23/2006  . PROSTATITIS NOS 11/23/2006  . OSTEOARTHRITIS, HIP, LEFT 11/23/2006   Clarene Critchley PT, DPT 12:02 PM, 01/17/18 [place], [place], [address] Phone: [phone]   Fax:  [phone]  Name: Gabriel Love MRN: [id number] Date of Birth: Dec 18, 1940

## 2018-01-19 ENCOUNTER — Ambulatory Visit (HOSPITAL_COMMUNITY): Payer: Medicare Other | Admitting: Physical Therapy

## 2018-01-19 ENCOUNTER — Encounter (HOSPITAL_COMMUNITY): Payer: Self-pay | Admitting: Physical Therapy

## 2018-01-19 DIAGNOSIS — R2689 Other abnormalities of gait and mobility: Secondary | ICD-10-CM

## 2018-01-19 DIAGNOSIS — R296 Repeated falls: Secondary | ICD-10-CM

## 2018-01-19 NOTE — Therapy (Signed)
[place], [place], [address] Phone: [phone]   Fax:  [phone]  Physical Therapy Treatment  Patient Details  Name: Gabriel Love MRN: [id number] Date of Birth: [birth year] Referring Provider (PT): Alysia Penna, MD   Encounter Date: 01/19/2018  PT End of Session - 01/19/18 1157    Visit Number  6    Number of Visits  17    Date for PT Re-Evaluation  02/21/18   Mini re-assess 01/24/18   Authorization Type  [employer] Medicare    Authorization Time Period  12/27/17 - 02/21/18    Authorization - Visit Number  6    Authorization - Number of Visits  10    PT Start Time  5465    PT Stop Time  1201    PT Time Calculation (min)  40 min    Equipment Utilized During Treatment  Gait belt    Activity Tolerance  Patient tolerated treatment well;Patient limited by fatigue    Behavior During Therapy  [facility] for tasks assessed/performed       Past Medical History:  Diagnosis Date  . Abdominal hernia   . Allergy   . Blood transfusion without reported diagnosis 12/2006   At [facility] with hip replacement - 2 units transfused  . Cancer ([place])    skin cancers  . Cataract   . Depression   . DJD (degenerative joint disease)    shoulders bilateral  . Environmental allergies   . Full dentures   . Hearing loss    bilateral hearing aids  . History of urinary tract infection   . Hyperlipidemia    under control  . Hypertension    "borderline"  . Nonspecific abnormal finding in stool contents   . Other general symptoms(780.99)   . Prostate hypertrophy    sees Dr. Gaynelle Arabian   . Urinary leakage     Past Surgical History:  Procedure Laterality Date  . CATARACT EXTRACTION W/PHACO Right 03/25/2014   Procedure: CATARACT EXTRACTION PHACO AND INTRAOCULAR LENS PLACEMENT; CDE:9.76;  Surgeon: Williams Che, MD;  Location: [facility];  Service: Ophthalmology;  Laterality: Right;  . CATARACT EXTRACTION W/PHACO Left 06/24/2014   Procedure: CATARACT  EXTRACTION PHACO AND INTRAOCULAR LENS PLACEMENT (IOC);  Surgeon: Williams Che, MD;  Location: [facility];  Service: Ophthalmology;  Laterality: Left;  CDE:8.45  . CIRCUMCISION N/A 03/26/2016   Procedure: CIRCUMCISION ADULT;  Surgeon: Carolan Clines, MD;  [facility];  Service: Urology;  Laterality: N/A;  . CIRCUMCISION, NON-NEWBORN  1985  . COLONOSCOPY  06-19-13, 03/05/15   per Dr. Deatra Ina, mass at the ileocecal valve plus several polyps, pathology benign, repeat in one year, polyp 2016   . COLONOSCOPY WITH PROPOFOL N/A 07/29/2015   Procedure: COLONOSCOPY WITH PROPOFOL;  Surgeon: Manus Gunning, MD;  [facility];  Service: Gastroenterology;  Laterality: N/A;  . JOINT REPLACEMENT  12/2006   total left hip per Dr. Percell Miller   . LAPAROSCOPIC PARTIAL COLECTOMY N/A 08/07/2013   Procedure: LAPAROSCOPIC right PARTIAL COLECTOMY;  Surgeon: Leighton Ruff, MD;  Location: [facility];  Service: General;  Laterality: N/A;  . MULTIPLE TOOTH EXTRACTIONS    . PROSTATE SURGERY  03-29-11   had CTT per Dr. Gaynelle Arabian, in office    There were no vitals filed for this visit.  Subjective Assessment - 01/19/18 1208    Subjective  Patient denied any pain this session.     Patient is accompained by:  Family member  Patient's wife   Pertinent History  Frequent falls, left total hip replacment, HTN    Limitations  Standing;Walking    How long can you sit comfortably?  Not limited    How long can you stand comfortably?  10-15 minutes standing    How long can you walk comfortably?  5 minutes walking    Diagnostic tests  MRI Lumbar spine 06/22/12 spinal stenosis    Patient Stated Goals  To have better balance    Currently in Pain?  No/denies                       [facility] Adult PT Treatment/Exercise - 01/19/18 0001      Knee/Hip Exercises: Standing   Other Standing Knee Exercises  Stepping strategy posterior x 10 each lower extremity      Knee/Hip Exercises: Seated   Long Arc Quad   Strengthening;Both;2 sets;10 reps    Long Arc Quad Weight  3 lbs.    Other Seated Knee/Hip Exercises  Marching over 6'' hurdle x 10 each LE with intermittent difficulty with clearance    Marching  Both;15 reps    Marching Weights  3 lbs.    Sit to Sand  2 sets;10 reps   with 1 UE assist         Balance Exercises - 01/19/18 1139      Balance Exercises: Standing   Standing Eyes Opened  Narrow base of support (BOS);Foam/compliant surface;3 reps;10 secs    Standing Eyes Closed  Wide (BOA);Foam/compliant surface;3 reps;10 secs    Tandem Stance  Eyes open;Upper extremity support 1;5 reps;10 secs    Step Over Hurdles / Cones  Sidestepping over 2 6-inch hurdles x 6 each before fatigue    Other Standing Exercises  Tapping foot toward colored cone x 10 each LE with bilateral HHA          PT Short Term Goals - 12/28/17 0858      PT SHORT TERM GOAL #1   Title  Patient will report understanding and report regular compliance with HEP for improved balance, strength and functional mobility.     Time  4    Period  Weeks    Status  On-going      PT SHORT TERM GOAL #2   Title  Patient will report ability to ambulate for 10 minutes in order to perform household chores with increased ease.     Time  4    Period  Weeks    Status  On-going      PT SHORT TERM GOAL #3   Title  Patient will demonstrate improvement on the TUG of 5 seconds or greater with LRAD indicating improved dynamic balance and decreased risk of falls.     Time  4    Period  Weeks    Status  On-going        PT Long Term Goals - 12/28/17 0859      PT LONG TERM GOAL #1   Title  Patient will demonstrate an improvement of 8 points on the Berg Balance Scale indicating improved balance and decreased risk of falling.     Time  8    Period  Weeks    Status  On-going      PT LONG TERM GOAL #2   Title  Patient will demonstrate improvement on the TUG of 10 seconds or greater with LRAD indicating improved dynamic balance and  decreased risk of falls.  Time  8    Period  Weeks    Status  On-going      PT LONG TERM GOAL #3   Title  Patient will demonstrate improvement on FOTO of at least 10% indicating improved perceived functional mobility.     Time  8    Period  Weeks    Status  On-going            Plan - 01/19/18 1207    Clinical Impression Statement  This session continued to focus on balance and functional lower extremity strengthening. This session added sidestepping over 6 inch hurdles with bilateral hand hold assist. This session patient continued to require frequent therapeutic rest breaks in order to complete all exercises. With sidestepping over hurdles paitent was only able to perform 6 repetitions before requiring a rest break. Plan to re-assess patient at next session.     Rehab Potential  Fair    Clinical Impairments Affecting Rehab Potential  Positive: Support system; Negative: comorbidities    PT Frequency  2x / week    PT Duration  8 weeks    PT Treatment/Interventions  ADLs/Self Care Home Management;Aquatic Therapy;DME Instruction;Gait training;Stair training;Functional mobility training;Therapeutic activities;Therapeutic exercise;Balance training;Neuromuscular re-education;Patient/family education;Orthotic Fit/Training;Manual techniques;Passive range of motion;Dry needling;Energy conservation;Taping    PT Next Visit Plan  Re-assess. Progress to more dynamic balance and gait activities as able. continue BLE, functional strengthening and balance activities at a support surface as able     PT Home Exercise Plan  12/28/17 - hip ext/abd/flex at counter, heel/toe raises seated    Consulted and Agree with Plan of Care  Patient       Patient will benefit from skilled therapeutic intervention in order to improve the following deficits and impairments:  Abnormal gait, Decreased balance, Decreased endurance, Decreased mobility, Difficulty walking, Obesity, Decreased activity tolerance, Decreased  strength, Postural dysfunction, Pain  Visit Diagnosis: Repeated falls  Other abnormalities of gait and mobility     Problem List Patient Active Problem List   Diagnosis Date Noted  . Bilateral leg edema 02/07/2017  . Osteoarthritis 01/28/2017  . Urge and stress incontinence 01/28/2017  . History of colonic polyps   . Colon polyp 08/07/2013  . 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 Nonspecific abnormal finding in stool contents 05/29/2013  . Atherosclerotic peripheral vascular disease with intermittent claudication ([place]) 06/14/2012  . Spinal stenosis of lumbar region 06/14/2012  . 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 PHIMOSIS 01/30/2008  . YEAST BALANITIS 10/17/2007  . Hyperlipidemia 11/23/2006  . OBESITY NOS 11/23/2006  . Depression 11/23/2006  . Essential hypertension 11/23/2006  . HEMATURIA 11/23/2006  . BENIGN PROSTATIC HYPERTROPHY 11/23/2006  . PROSTATITIS NOS 11/23/2006  . OSTEOARTHRITIS, HIP, LEFT 11/23/2006   Clarene Critchley PT, DPT 12:09 PM, 01/19/18 [place], [place], [address] Phone: [phone]   Fax:  [phone]  Name: EVERADO PILLSBURY MRN: [id number] Date of Birth: [birth year]

## 2018-01-20 ENCOUNTER — Encounter: Payer: Self-pay | Admitting: Family Medicine

## 2018-01-20 ENCOUNTER — Ambulatory Visit: Payer: Medicare Other | Admitting: Family Medicine

## 2018-01-20 VITALS — BP 128/78 | HR 72 | Temp 97.9°F | Wt 340.0 lb

## 2018-01-20 DIAGNOSIS — R600 Localized edema: Secondary | ICD-10-CM

## 2018-01-20 DIAGNOSIS — I70219 Atherosclerosis of native arteries of extremities with intermittent claudication, unspecified extremity: Secondary | ICD-10-CM | POA: Diagnosis not present

## 2018-01-20 DIAGNOSIS — L03119 Cellulitis of unspecified part of limb: Secondary | ICD-10-CM

## 2018-01-20 DIAGNOSIS — I10 Essential (primary) hypertension: Secondary | ICD-10-CM | POA: Diagnosis not present

## 2018-01-20 NOTE — Progress Notes (Signed)
   Subjective:    Patient ID: Gabriel Love, male    DOB: 01-04-1941, 77 y.o.   MRN: [id number]  HPI Here to follow up leg edema and cellulitis. He has been taking Keflex for this. Two weeks ago we stopped Amlodipine and started Metoprolol succinate 50 mg daily in its place, along with Lasix 40 mg daily. Since then he has lost 4 lbs and the swelling has greatly improved. He finished the Keflex last night, and the redness has disappeared.    Review of Systems  Constitutional: Negative.   Respiratory: Negative.   Cardiovascular: Positive for leg swelling. Negative for chest pain and palpitations.  Skin: Negative for color change and rash.       Objective:   Physical Exam  Constitutional: He is oriented to person, place, and time. He appears well-developed and well-nourished.  Cardiovascular: Normal rate, regular rhythm, normal heart sounds and intact distal pulses.  Pulmonary/Chest: Effort normal and breath sounds normal.  Musculoskeletal:  2+ edema of both lower legs. No erythema or warmth or tenderness.  Neurological: He is alert and oriented to person, place, and time.          Assessment & Plan:  The cellulitis has resolved. The edema has improved. His BP is stable.  Alysia Penna, MD

## 2018-01-24 ENCOUNTER — Encounter (HOSPITAL_COMMUNITY): Payer: Self-pay

## 2018-01-24 ENCOUNTER — Ambulatory Visit (HOSPITAL_COMMUNITY): Payer: Medicare Other | Attending: Family Medicine

## 2018-01-24 DIAGNOSIS — R2689 Other abnormalities of gait and mobility: Secondary | ICD-10-CM | POA: Insufficient documentation

## 2018-01-24 DIAGNOSIS — R296 Repeated falls: Secondary | ICD-10-CM | POA: Insufficient documentation

## 2018-01-24 NOTE — Therapy (Addendum)
[facility] [address] [place], [place], [address] Phone: [phone]   Fax:  [phone]  Physical Therapy Treatment  Patient Details  Name: Gabriel Love MRN: [id number] Date of Birth: 25-Mar-1940 Referring Provider (PT): Alysia Penna, MD   Encounter Date: 01/24/2018  Progress Note Reporting Period 12/27/17 to 01/24/18  See note below for Objective Data and Assessment of Progress/Goals.      As a licensed physical therapist I have read and agree with the following note. Clarene Critchley PT, DPT 10:44 AM, 01/25/18 [phone]   PT End of Session - 01/24/18 0936    Visit Number  7    Number of Visits  17    Date for PT Re-Evaluation  02/21/18   Minireassess complete 01/24/18   Authorization Type  [employer] Medicare    Authorization Time Period  12/27/17 - 02/21/18    Authorization - Visit Number  7    Authorization - Number of Visits  10    PT Start Time  0856    PT Stop Time  0943    PT Time Calculation (min)  47 min    Equipment Utilized During Treatment  Gait belt    Activity Tolerance  Patient tolerated treatment well;Patient limited by fatigue    Behavior During Therapy  WFL for tasks assessed/performed       Past Medical History:  Diagnosis Date  . Abdominal hernia   . Allergy   . Blood transfusion without reported diagnosis 12/2006   At [facility] with hip replacement - 2 units transfused  . Cancer ([place])    skin cancers  . Cataract   . Depression   . DJD (degenerative joint disease)    shoulders bilateral  . Environmental allergies   . Full dentures   . Hearing loss    bilateral hearing aids  . History of urinary tract infection   . Hyperlipidemia    under control  . Hypertension    "borderline"  . Nonspecific abnormal finding in stool contents   . Other general symptoms(780.99)   . Prostate hypertrophy    sees Dr. Gaynelle Arabian   . Urinary leakage     Past Surgical History:  Procedure Laterality Date  . CATARACT  EXTRACTION W/PHACO Right 03/25/2014   Procedure: CATARACT EXTRACTION PHACO AND INTRAOCULAR LENS PLACEMENT; CDE:9.76;  Surgeon: Williams Che, MD;  Location: [facility];  Service: Ophthalmology;  Laterality: Right;  . CATARACT EXTRACTION W/PHACO Left 06/24/2014   Procedure: CATARACT EXTRACTION PHACO AND INTRAOCULAR LENS PLACEMENT (IOC);  Surgeon: Williams Che, MD;  Location: [facility];  Service: Ophthalmology;  Laterality: Left;  CDE:8.45  . CIRCUMCISION N/A 03/26/2016   Procedure: CIRCUMCISION ADULT;  Surgeon: Carolan Clines, MD;  Location: [facility];  Service: Urology;  Laterality: N/A;  . CIRCUMCISION, NON-NEWBORN  1985  . COLONOSCOPY  06-19-13, 03/05/15   per Dr. Deatra Ina, mass at the ileocecal valve plus several polyps, pathology benign, repeat in one year, polyp 2016   . COLONOSCOPY WITH PROPOFOL N/A 07/29/2015   Procedure: COLONOSCOPY WITH PROPOFOL;  Surgeon: Manus Gunning, MD;  [facility]: [facility];  Service: Gastroenterology;  Laterality: N/A;  . JOINT REPLACEMENT  12/2006   total left hip per Dr. Percell Miller   . LAPAROSCOPIC PARTIAL COLECTOMY N/A 08/07/2013   Procedure: LAPAROSCOPIC right PARTIAL COLECTOMY;  Surgeon: Leighton Ruff, MD;  Location: [facility];  Service: General;  Laterality: N/A;  . MULTIPLE TOOTH EXTRACTIONS    . PROSTATE SURGERY  03-29-11  had CTT per Dr. Gaynelle Arabian, in office    There were no vitals filed for this visit.  Subjective Assessment - 01/24/18 0908    Subjective  Pt stated he is feeling good, no reports of pain.  Went to [place] and waiting on apt for pediatrist    Pertinent History  Frequent falls, left total hip replacment, HTN    How long can you sit comfortably?  Not limited    How long can you stand comfortably?  15 minutes (was 10-15 minutes standing)    How long can you walk comfortably?  able to walk for 10-15 minutes (5 minutes walking)         [facility] PT Assessment - 01/24/18 0001      Assessment   Medical Diagnosis  Frequent falls    Referring  Provider (PT)  Alysia Penna, MD    Onset Date/Surgical Date  --   2 years   Prior Therapy  After hip surgery      Precautions   Precautions  None      Observation/Other Assessments   Focus on Therapeutic Outcomes (FOTO)   51.58% (48% limitation)   was 40% (60% limited)     Posture/Postural Control   Posture/Postural Control  Postural limitations    Postural Limitations  Rounded Shoulders;Forward head      Strength   Strength Assessment Site  Hip;Knee;Ankle    Right/Left Hip  Right;Left    Right Hip Flexion  4+/5   was 4/5   Right Hip Extension  3+/5    Right Hip ABduction  4/5   was 4/5   Left Hip Flexion  4+/5   was 4+   Left Hip Extension  3+/5    Left Hip ABduction  4/5   was 4/5   Right/Left Knee  Right;Left    Right Knee Flexion  4+/5   was 4+   Right Knee Extension  4+/5   was 4+   Left Knee Flexion  4+/5   was 4+   Left Knee Extension  4+/5   was 4+   Right/Left Ankle  Right;Left    Right Ankle Dorsiflexion  2-/5   was 2-   Left Ankle Dorsiflexion  2-/5   was 2-     Standardized Balance Assessment   Standardized Balance Assessment  Berg Balance Test;Timed Up and Go Test      Berg Balance Test   Sit to Stand  Able to stand without using hands and stabilize independently   was 3   Standing Unsupported  Able to stand 30 seconds unsupported   was 2, HHA required forstability following 1 minute   Sitting with Back Unsupported but Feet Supported on Floor or Stool  Able to sit safely and securely 2 minutes   was 4   Stand to Sit  Controls descent by using hands   was 3   Transfers  Able to transfer safely, definite need of hands   was 3   Standing Unsupported with Eyes Closed  Unable to keep eyes closed 3 seconds but stays steady   was 0   Standing Ubsupported with Feet Together  Able to place feet together independently but unable to hold for 30 seconds   was 2   From Standing, Reach Forward with Outstretched Arm  Reaches forward but needs supervision    was 0   From Standing Position, Pick up Object from Floor  Unable to try/needs assist to keep balance   was 0  From Standing Position, Turn to Look Behind Over each Shoulder  Needs supervision when turning   was 1   Turn 360 Degrees  Needs assistance while turning   was 0   Standing Unsupported, Alternately Place Feet on Step/Stool  Able to complete >2 steps/needs minimal assist   was 1   Standing Unsupported, One Foot in [employer] balance while stepping or standing   was 0 able to maintain 8" prior need for HHA   Standing on One Leg  Tries to lift leg/unable to hold 3 seconds but remains standing independently   was 1   Total Score  23      Timed Up and Go Test   Normal TUG (seconds)  19.48   was 24.03 withWith 4-wheeled walker. Use of UEs to stand   TUG Comments  With 4-wheeled walker. Use of UEs to stand                   [facility] Adult PT Treatment/Exercise - 01/24/18 0001      Knee/Hip Exercises: Seated   Other Seated Knee/Hip Exercises  Marching over 6'' hurdle x 10 each LE with intermittent difficulty with clearance    Sit to Sand  2 sets;10 reps          Balance Exercises - 01/24/18 0950      Balance Exercises: Standing   Standing Eyes Closed  Wide (BOA);Foam/compliant surface;3 reps;10 secs    Tandem Stance  Eyes open;Upper extremity support 1;5 reps;10 secs    Other Standing Exercises  Tapping foot toward colored cone x 10 each LE with bilateral HHA          PT Short Term Goals - 01/24/18 0936      PT SHORT TERM GOAL #1   Title  Patient will report understanding and report regular compliance with HEP for improved balance, strength and functional mobility.     Baseline  01/24/18:  Reports compliance with HEP at least 2-3 times a week    Status  Achieved      PT SHORT TERM GOAL #2   Title  Patient will report ability to ambulate for 10 minutes in order to perform household chores with increased ease.     Baseline  01/24/18:  Reports ability  to ambulate 10-15 minutes in store with rollator    Status  Achieved      PT SHORT TERM GOAL #3   Title  Patient will demonstrate improvement on the TUG of 5 seconds or greater with LRAD indicating improved dynamic balance and decreased risk of falls.     Baseline  11/05: TUG with 4 wheeled walker 19.48" was 24.03"    Status  Partially Met        PT Long Term Goals - 01/24/18 1901      PT LONG TERM GOAL #1   Title  Patient will demonstrate an improvement of 8 points on the Berg Balance Scale indicating improved balance and decreased risk of falling.     Baseline  01/24/18:  BERG 23/56 (eval was 20/56)    Time  8    Period  Weeks    Status  On-going      PT LONG TERM GOAL #2   Title  Patient will demonstrate improvement on the TUG of 10 seconds or greater with LRAD indicating improved dynamic balance and decreased risk of falls.     Baseline  01/24/18: TUG 19.48" (was 24.03")    Time  8    Period  Weeks    Status  On-going      PT LONG TERM GOAL #3   Title  Patient will demonstrate improvement on FOTO of at least 10% indicating improved perceived functional mobility.     Baseline  01/24/18: FOTO 51.58% was 40.27%    Status  Achieved           Plan - 01/24/18 1905    Clinical Impression Statement  Reviewed goals this session wiht the following findings:  Pt reports improvements with ability to stand and ambuate in store for 10-15 minute prior fatigue.  Balance continues to be impairement though has improved to 23/56 which continues to place pt at high risk of falls.  Funcitonal strengthening is improving wiht improved TUG to 19.48" (was 24.03") both with use of rollator.  Hip strengthening is progressing, all hip strength at 4 or 4+/5 except for dorsiflexors which continues to have significant weakness.  Improved self perceived functional abilities with improved FOTO score to 51.58% (was 40.27% initial eval).    Rehab Potential  Fair    Clinical Impairments Affecting Rehab  Potential  Positive: Support system; Negative: comorbidities    PT Frequency  2x / week    PT Duration  8 weeks    PT Treatment/Interventions  ADLs/Self Care Home Management;Aquatic Therapy;DME Instruction;Gait training;Stair training;Functional mobility training;Therapeutic activities;Therapeutic exercise;Balance training;Neuromuscular re-education;Patient/family education;Orthotic Fit/Training;Manual techniques;Passive range of motion;Dry needling;Energy conservation;Taping    PT Next Visit Plan  Progress to more dynamic balance and gait activities as able. continue BLE, functional strengthening and balance activities at a support surface as able     PT Home Exercise Plan  12/28/17 - hip ext/abd/flex at counter, heel/toe raises seated       Patient will benefit from skilled therapeutic intervention in order to improve the following deficits and impairments:  Abnormal gait, Decreased balance, Decreased endurance, Decreased mobility, Difficulty walking, Obesity, Decreased activity tolerance, Decreased strength, Postural dysfunction, Pain  Visit Diagnosis: Repeated falls  Other abnormalities of gait and mobility     Problem List Patient Active Problem List   Diagnosis Date Noted  . Bilateral leg edema 02/07/2017  . Osteoarthritis 01/28/2017  . Urge and stress incontinence 01/28/2017  . History of colonic polyps   . Colon polyp 08/07/2013  . 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 Nonspecific abnormal finding in stool contents 05/29/2013  . Atherosclerotic peripheral vascular disease with intermittent claudication ([place]) 06/14/2012  . Spinal stenosis of lumbar region 06/14/2012  . 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 PHIMOSIS 01/30/2008  . YEAST BALANITIS 10/17/2007  . Hyperlipidemia 11/23/2006  . OBESITY NOS 11/23/2006  . Depression 11/23/2006  . Essential hypertension 11/23/2006  . HEMATURIA  11/23/2006  . BENIGN PROSTATIC HYPERTROPHY 11/23/2006  . PROSTATITIS NOS 11/23/2006  . OSTEOARTHRITIS, HIP, LEFT 11/23/2006   Ihor Austin, [place]; [place]  Aldona Lento 01/24/2018, 7:11 PM  [facility] [place], [place], [address] Phone: [phone]   Fax:  [phone]  Name: BOSTYN BOGIE MRN: [id number] Date of Birth: [birth year]

## 2018-01-26 ENCOUNTER — Ambulatory Visit (HOSPITAL_COMMUNITY): Payer: Medicare Other

## 2018-01-26 ENCOUNTER — Encounter (HOSPITAL_COMMUNITY): Payer: Self-pay

## 2018-01-26 DIAGNOSIS — R2689 Other abnormalities of gait and mobility: Secondary | ICD-10-CM

## 2018-01-26 DIAGNOSIS — R296 Repeated falls: Secondary | ICD-10-CM

## 2018-01-26 NOTE — Therapy (Signed)
[facility] [place], [place], [address] Phone: [phone]   Fax:  [phone]  Physical Therapy Treatment  Patient Details  Name: Gabriel Love MRN: [id number] Date of Birth: May 04, 1940 Referring Provider (PT): Alysia Penna, MD   Encounter Date: 01/26/2018  PT End of Session - 01/26/18 1005    Visit Number  8    Number of Visits  17    Date for PT Re-Evaluation  02/21/18   MInireassess complete 01/24/18   Authorization Type  [employer] Medicare    Authorization Time Period  12/27/17 - 02/21/18    Authorization - Visit Number  8    Authorization - Number of Visits  10    PT Start Time  0940    PT Stop Time  1025    PT Time Calculation (min)  45 min    Equipment Utilized During Treatment  Gait belt    Activity Tolerance  Patient tolerated treatment well;Patient limited by fatigue    Behavior During Therapy  [facility] for tasks assessed/performed       Past Medical History:  Diagnosis Date  . Abdominal hernia   . Allergy   . Blood transfusion without reported diagnosis 12/2006   At [facility] with hip replacement - 2 units transfused  . Cancer ([place])    skin cancers  . Cataract   . Depression   . DJD (degenerative joint disease)    shoulders bilateral  . Environmental allergies   . Full dentures   . Hearing loss    bilateral hearing aids  . History of urinary tract infection   . Hyperlipidemia    under control  . Hypertension    "borderline"  . Nonspecific abnormal finding in stool contents   . Other general symptoms(780.99)   . Prostate hypertrophy    sees Dr. Gaynelle Arabian   . Urinary leakage     Past Surgical History:  Procedure Laterality Date  . CATARACT EXTRACTION W/PHACO Right 03/25/2014   Procedure: CATARACT EXTRACTION PHACO AND INTRAOCULAR LENS PLACEMENT; CDE:9.76;  Surgeon: Williams Che, MD;  Location: [facility];  Service: Ophthalmology;  Laterality: Right;  . CATARACT EXTRACTION W/PHACO Left 06/24/2014   Procedure:  CATARACT EXTRACTION PHACO AND INTRAOCULAR LENS PLACEMENT (IOC);  Surgeon: Williams Che, MD;  Location: [facility];  Service: Ophthalmology;  Laterality: Left;  CDE:8.45  . CIRCUMCISION N/A 03/26/2016   Procedure: CIRCUMCISION ADULT;  Surgeon: Carolan Clines, MD;  Location: [facility];  Service: Urology;  Laterality: N/A;  . CIRCUMCISION, NON-NEWBORN  1985  . COLONOSCOPY  06-19-13, 03/05/15   per Dr. Deatra Ina, mass at the ileocecal valve plus several polyps, pathology benign, repeat in one year, polyp 2016   . COLONOSCOPY WITH PROPOFOL N/A 07/29/2015   Procedure: COLONOSCOPY WITH PROPOFOL;  Surgeon: Manus Gunning, MD;  [facility];  Service: Gastroenterology;  Laterality: N/A;  . JOINT REPLACEMENT  12/2006   total left hip per Dr. Percell Miller   . LAPAROSCOPIC PARTIAL COLECTOMY N/A 08/07/2013   Procedure: LAPAROSCOPIC right PARTIAL COLECTOMY;  Surgeon: Leighton Ruff, MD;  Location: [facility];  Service: General;  Laterality: N/A;  . MULTIPLE TOOTH EXTRACTIONS    . PROSTATE SURGERY  03-29-11   had CTT per Dr. Gaynelle Arabian, in office    There were no vitals filed for this visit.  Subjective Assessment - 01/26/18 0945    Subjective  Pt stated his knees can tell rain is coming today, increased pain today 6/10.  Has apt wiht  pediatrist in 2 weeks    Pertinent History  Frequent falls, left total hip replacment, HTN    Patient Stated Goals  To have better balance    Currently in Pain?  Yes    Pain Score  6     Pain Location  Knee    Pain Orientation  Right;Left    Pain Descriptors / Indicators  Sore    Pain Type  Chronic pain    Pain Onset  More than a month ago    Pain Frequency  Intermittent    Aggravating Factors   weather changes    Pain Relieving Factors  unsure    Effect of Pain on Daily Activities  keeps going                       [facility] Adult PT Treatment/Exercise - 01/26/18 0001      Exercises   Exercises  Knee/Hip      Knee/Hip Exercises: Seated   Long Arc  Quad  Strengthening;15 reps    Long Arc Quad Weight  3 lbs.    Other Seated Knee/Hip Exercises  Marching over 6'' hurdle x 10 each LE with intermittent difficulty with clearance    Other Seated Knee/Hip Exercises  BAPS L2 DF/PF; Inv;Ev 10x each    Sit to Sand  2 sets;10 reps   minimal HHA         Balance Exercises - 01/26/18 1000      Balance Exercises: Standing   Standing Eyes Closed  Wide (BOA);Foam/compliant surface;3 reps;10 secs    Tandem Stance  Eyes open;Upper extremity support 1;5 reps;10 secs    Step Over Hurdles / Cones  Sidestepping over 2 6-inch hurdles x 6 each before fatigue    Other Standing Exercises  Tapping foot toward colored cone x 10 each LE with bilateral HHA          PT Short Term Goals - 01/24/18 0936      PT SHORT TERM GOAL #1   Title  Patient will report understanding and report regular compliance with HEP for improved balance, strength and functional mobility.     Baseline  01/24/18:  Reports compliance with HEP at least 2-3 times a week    Status  Achieved      PT SHORT TERM GOAL #2   Title  Patient will report ability to ambulate for 10 minutes in order to perform household chores with increased ease.     Baseline  01/24/18:  Reports ability to ambulate 10-15 minutes in store with rollator    Status  Achieved      PT SHORT TERM GOAL #3   Title  Patient will demonstrate improvement on the TUG of 5 seconds or greater with LRAD indicating improved dynamic balance and decreased risk of falls.     Baseline  11/05: TUG with 4 wheeled walker 19.48" was 24.03"    Status  Partially Met        PT Long Term Goals - 01/24/18 1901      PT LONG TERM GOAL #1   Title  Patient will demonstrate an improvement of 8 points on the Berg Balance Scale indicating improved balance and decreased risk of falling.     Baseline  01/24/18:  BERG 23/56 (eval was 20/56)    Time  8    Period  Weeks    Status  On-going      PT LONG TERM GOAL #2   Title    Patient will  demonstrate improvement on the TUG of 10 seconds or greater with LRAD indicating improved dynamic balance and decreased risk of falls.     Baseline  01/24/18: TUG 19.48" (was 24.03")    Time  8    Period  Weeks    Status  On-going      PT LONG TERM GOAL #3   Title  Patient will demonstrate improvement on FOTO of at least 10% indicating improved perceived functional mobility.     Baseline  01/24/18: FOTO 51.58% was 40.27%    Status  Achieved            Plan - 01/26/18 1248    Clinical Impression Statement  Session focus on LE functional strengthening and balance training.  Min A for safety with balance activities due to LOB with NBOS positions as well on dynamic surface.  Pt with 2 episodes on knee buckling iwht min A for safety due to weakness.  Added seated BAPS to improve propriception and dorsiflexion strengthening.  No reports of pain at EOS.      Rehab Potential  Fair    Clinical Impairments Affecting Rehab Potential  Positive: Support system; Negative: comorbidities    PT Frequency  2x / week    PT Duration  8 weeks    PT Treatment/Interventions  ADLs/Self Care Home Management;Aquatic Therapy;DME Instruction;Gait training;Stair training;Functional mobility training;Therapeutic activities;Therapeutic exercise;Balance training;Neuromuscular re-education;Patient/family education;Orthotic Fit/Training;Manual techniques;Passive range of motion;Dry needling;Energy conservation;Taping    PT Next Visit Plan  Progress to more dynamic balance and gait activities as able. continue BLE, functional strengthening and balance activities at a support surface as able     PT Home Exercise Plan  12/28/17 - hip ext/abd/flex at counter, heel/toe raises seated       Patient will benefit from skilled therapeutic intervention in order to improve the following deficits and impairments:  Abnormal gait, Decreased balance, Decreased endurance, Decreased mobility, Difficulty walking, Obesity, Decreased  activity tolerance, Decreased strength, Postural dysfunction, Pain  Visit Diagnosis: Repeated falls  Other abnormalities of gait and mobility     Problem List Patient Active Problem List   Diagnosis Date Noted  . Bilateral leg edema 02/07/2017  . Osteoarthritis 01/28/2017  . Urge and stress incontinence 01/28/2017  . History of colonic polyps   . Colon polyp 08/07/2013  . 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 Nonspecific abnormal finding in stool contents 05/29/2013  . Atherosclerotic peripheral vascular disease with intermittent claudication ([facility]) 06/14/2012  . Spinal stenosis of lumbar region 06/14/2012  . 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 PHIMOSIS 01/30/2008  . YEAST BALANITIS 10/17/2007  . Hyperlipidemia 11/23/2006  . OBESITY NOS 11/23/2006  . Depression 11/23/2006  . Essential hypertension 11/23/2006  . HEMATURIA 11/23/2006  . BENIGN PROSTATIC HYPERTROPHY 11/23/2006  . PROSTATITIS NOS 11/23/2006  . OSTEOARTHRITIS, HIP, LEFT 11/23/2006   Casey Cockerham, LPTA; CBIS [phone]  Cockerham, Casey Jo 01/26/2018, 12:55 PM  [facility] [address] [place], [place], [address] Phone: [phone]   Fax:  [phone]  Name: Benaiah L Poster MRN: [id number] Date of Birth: 12/26/1940   

## 2018-01-31 ENCOUNTER — Ambulatory Visit (HOSPITAL_COMMUNITY): Payer: Medicare Other

## 2018-01-31 ENCOUNTER — Encounter (HOSPITAL_COMMUNITY): Payer: Self-pay

## 2018-01-31 DIAGNOSIS — R296 Repeated falls: Secondary | ICD-10-CM | POA: Diagnosis not present

## 2018-01-31 DIAGNOSIS — R2689 Other abnormalities of gait and mobility: Secondary | ICD-10-CM

## 2018-01-31 NOTE — Therapy (Signed)
[place], [place], [address] Phone: [phone]   Fax:  [phone]  Physical Therapy Treatment  Patient Details  Name: Gabriel Love MRN: [id number] Date of Birth: 09/30/1940 Referring Provider (PT): Alysia Penna, MD   Encounter Date: 01/31/2018  PT End of Session - 01/31/18 0908    Visit Number  9    Number of Visits  17    Date for PT Re-Evaluation  02/21/18   Minireassess complete 01/24/18, visit #7   Authorization Type  [facility] Medicare    Authorization Time Period  12/27/17 - 02/21/18    Authorization - Visit Number  9    Authorization - Number of Visits  17    PT Start Time  0901    PT Stop Time  0945    PT Time Calculation (min)  44 min    Equipment Utilized During Treatment  Gait belt    Activity Tolerance  Patient tolerated treatment well;Patient limited by fatigue    Behavior During Therapy  [facility] for tasks assessed/performed       Past Medical History:  Diagnosis Date  . Abdominal hernia   . Allergy   . Blood transfusion without reported diagnosis 12/2006   At [facility] with hip replacement - 2 units transfused  . Cancer ([place])    skin cancers  . Cataract   . Depression   . DJD (degenerative joint disease)    shoulders bilateral  . Environmental allergies   . Full dentures   . Hearing loss    bilateral hearing aids  . History of urinary tract infection   . Hyperlipidemia    under control  . Hypertension    "borderline"  . Nonspecific abnormal finding in stool contents   . Other general symptoms(780.99)   . Prostate hypertrophy    sees Dr. Gaynelle Arabian   . Urinary leakage     Past Surgical History:  Procedure Laterality Date  . CATARACT EXTRACTION W/PHACO Right 03/25/2014   Procedure: CATARACT EXTRACTION PHACO AND INTRAOCULAR LENS PLACEMENT; CDE:9.76;  Surgeon: Williams Che, MD;  [facility]: [facility];  Service: Ophthalmology;  Laterality: Right;  . CATARACT EXTRACTION W/PHACO Left 06/24/2014    Procedure: CATARACT EXTRACTION PHACO AND INTRAOCULAR LENS PLACEMENT (IOC);  Surgeon: Williams Che, MD;  Location: [facility];  Service: Ophthalmology;  Laterality: Left;  CDE:8.45  . CIRCUMCISION N/A 03/26/2016   Procedure: CIRCUMCISION ADULT;  Surgeon: Carolan Clines, MD;  Location: [facility];  Service: Urology;  Laterality: N/A;  . CIRCUMCISION, NON-NEWBORN  1985  . COLONOSCOPY  06-19-13, 03/05/15   per Dr. Deatra Ina, mass at the ileocecal valve plus several polyps, pathology benign, repeat in one year, polyp 2016   . COLONOSCOPY WITH PROPOFOL N/A 07/29/2015   Procedure: COLONOSCOPY WITH PROPOFOL;  Surgeon: Manus Gunning, MD;  [facility];  Service: Gastroenterology;  Laterality: N/A;  . JOINT REPLACEMENT  12/2006   total left hip per Dr. Percell Miller   . LAPAROSCOPIC PARTIAL COLECTOMY N/A 08/07/2013   Procedure: LAPAROSCOPIC right PARTIAL COLECTOMY;  Surgeon: Leighton Ruff, MD;  Location: [facility];  Service: General;  Laterality: N/A;  . MULTIPLE TOOTH EXTRACTIONS    . PROSTATE SURGERY  03-29-11   had CTT per Dr. Gaynelle Arabian, in office    There were no vitals filed for this visit.  Subjective Assessment - 01/31/18 0905    Subjective  Pt stated he has apt scheduled next Wednesday with podiatrist to trim toe nails, plans to  wear more supportive shoes when nails are trimmed to keep feet from rolling out.    Patient is accompained by:  Family member   pt.'s wife   Pertinent History  Frequent falls, left total hip replacment, HTN    Patient Stated Goals  To have better balance    Currently in Pain?  No/denies                       [facility] Adult PT Treatment/Exercise - 01/31/18 0001      Exercises   Exercises  Knee/Hip      Knee/Hip Exercises: Standing   Forward Step Up  Both;10 reps;Step Height: 4";Hand Hold: 2    Functional Squat  1 set;10 reps    Functional Squat Limitations  BUE on // bars      Knee/Hip Exercises: Seated   Long Arc Quad  Strengthening;15 reps     Long Arc Quad Weight  3 lbs.    Other Seated Knee/Hip Exercises  Marching over 6'' hurdle x 10 each LE with intermittent difficulty with clearance    Other Seated Knee/Hip Exercises  heel and toe raises 2x 10    Sit to Sand  2 sets;10 reps          Balance Exercises - 01/31/18 0937      Balance Exercises: Standing   Standing Eyes Opened  Narrow base of support (BOS);Foam/compliant surface;3 reps;30 secs    Tandem Stance  Eyes open;Upper extremity support 1;5 reps;10 secs    Step Over Hurdles / Cones  Sidestepping over 2 6-inch hurdles x 6 each before fatigue    Other Standing Exercises  Tapping foot toward colored cone x 10 each LE with bilateral HHA        PT Education - 01/31/18 1406    Education Details  Pt educated on benefits of orthortics to improve foot support and reduce excess supination with WB, encouraged to discuss with podiatrist next week    Person(s) Educated  Patient;Spouse    Methods  Explanation    Comprehension  Verbalized understanding       PT Short Term Goals - 01/24/18 0936      PT SHORT TERM GOAL #1   Title  Patient will report understanding and report regular compliance with HEP for improved balance, strength and functional mobility.     Baseline  01/24/18:  Reports compliance with HEP at least 2-3 times a week    Status  Achieved      PT SHORT TERM GOAL #2   Title  Patient will report ability to ambulate for 10 minutes in order to perform household chores with increased ease.     Baseline  01/24/18:  Reports ability to ambulate 10-15 minutes in store with rollator    Status  Achieved      PT SHORT TERM GOAL #3   Title  Patient will demonstrate improvement on the TUG of 5 seconds or greater with LRAD indicating improved dynamic balance and decreased risk of falls.     Baseline  11/05: TUG with 4 wheeled walker 19.48" was 24.03"    Status  Partially Met        PT Long Term Goals - 01/24/18 1901      PT LONG TERM GOAL #1   Title  Patient  will demonstrate an improvement of 8 points on the Berg Balance Scale indicating improved balance and decreased risk of falling.     Baseline  01/24/18:  BERG 23/56 (eval was 20/56)    Time  8    Period  Weeks    Status  On-going      PT LONG TERM GOAL #2   Title  Patient will demonstrate improvement on the TUG of 10 seconds or greater with LRAD indicating improved dynamic balance and decreased risk of falls.     Baseline  01/24/18: TUG 19.48" (was 24.03")    Time  8    Period  Weeks    Status  On-going      PT LONG TERM GOAL #3   Title  Patient will demonstrate improvement on FOTO of at least 10% indicating improved perceived functional mobility.     Baseline  01/24/18: FOTO 51.58% was 40.27%    Status  Achieved            Plan - 01/31/18 1009    Clinical Impression Statement  Continued session focus on LE functional strenghtening and balalnce training.  Noted increased supination with Rt foot during balalnce activities.  Pt reports he has podiatrist apt next week to address toe nails, encouraged to discuss orthotics with MD for foot support.  Min A for safety with balance activites with LOB during NBOS and dynamic surfaces, 2 episodes of knee buckling during session required min to mod A for safety.  Pt continues to be limited by fatigue requiring seated rest breaks through session.      Rehab Potential  Fair    PT Frequency  2x / week    PT Duration  8 weeks    PT Treatment/Interventions  ADLs/Self Care Home Management;Aquatic Therapy;DME Instruction;Gait training;Stair training;Functional mobility training;Therapeutic activities;Therapeutic exercise;Balance training;Neuromuscular re-education;Patient/family education;Orthotic Fit/Training;Manual techniques;Passive range of motion;Dry needling;Energy conservation;Taping    PT Next Visit Plan  Progress to more dynamic balance and gait activities as able. continue BLE, functional strengthening and balance activities at a support  surface as able     PT Home Exercise Plan  12/28/17 - hip ext/abd/flex at counter, heel/toe raises seated       Patient will benefit from skilled therapeutic intervention in order to improve the following deficits and impairments:  Abnormal gait, Decreased balance, Decreased endurance, Decreased mobility, Difficulty walking, Obesity, Decreased activity tolerance, Decreased strength, Postural dysfunction, Pain  Visit Diagnosis: Repeated falls  Other abnormalities of gait and mobility     Problem List Patient Active Problem List   Diagnosis Date Noted  . Bilateral leg edema 02/07/2017  . Osteoarthritis 01/28/2017  . Urge and stress incontinence 01/28/2017  . History of colonic polyps   . Colon polyp 08/07/2013  . 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 Nonspecific abnormal finding in stool contents 05/29/2013  . Atherosclerotic peripheral vascular disease with intermittent claudication ([place]) 06/14/2012  . Spinal stenosis of lumbar region 06/14/2012  . 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 PHIMOSIS 01/30/2008  . YEAST BALANITIS 10/17/2007  . Hyperlipidemia 11/23/2006  . OBESITY NOS 11/23/2006  . Depression 11/23/2006  . Essential hypertension 11/23/2006  . HEMATURIA 11/23/2006  . BENIGN PROSTATIC HYPERTROPHY 11/23/2006  . PROSTATITIS NOS 11/23/2006  . OSTEOARTHRITIS, HIP, LEFT 11/23/2006   Ihor Austin, [place]; [place]  Aldona Lento 01/31/2018, 2:08 PM  [place], [place], [address] Phone: [phone]   Fax:  [phone]  Name: Gabriel Love MRN: [id number] Date of Birth: [birth year]

## 2018-02-02 ENCOUNTER — Ambulatory Visit (HOSPITAL_COMMUNITY): Payer: Medicare Other | Admitting: Physical Therapy

## 2018-02-02 DIAGNOSIS — R296 Repeated falls: Secondary | ICD-10-CM

## 2018-02-02 DIAGNOSIS — R2689 Other abnormalities of gait and mobility: Secondary | ICD-10-CM

## 2018-02-02 NOTE — Therapy (Signed)
[place], [place], [address] Phone: [phone]   Fax:  [phone]  Physical Therapy Treatment  Patient Details  Name: Gabriel Love MRN: [id number] Date of Birth: 1940/05/15 Referring Provider (PT): Alysia Penna, MD   Encounter Date: 02/02/2018  PT End of Session - 02/02/18 1015    Visit Number  10    Number of Visits  17    Date for PT Re-Evaluation  02/21/18   Minireassess complete 01/24/18, visit #7   Authorization Type  [facility] Medicare    Authorization Time Period  12/27/17 - 02/21/18    Authorization - Visit Number  10    Authorization - Number of Visits  17    PT Start Time  5208    PT Stop Time  1025    PT Time Calculation (min)  38 min    Equipment Utilized During Treatment  Gait belt    Activity Tolerance  Patient tolerated treatment well;Patient limited by fatigue    Behavior During Therapy  [facility] for tasks assessed/performed       Past Medical History:  Diagnosis Date  . Abdominal hernia   . Allergy   . Blood transfusion without reported diagnosis 12/2006   At [facility] with hip replacement - 2 units transfused  . Cancer ([place])    skin cancers  . Cataract   . Depression   . DJD (degenerative joint disease)    shoulders bilateral  . Environmental allergies   . Full dentures   . Hearing loss    bilateral hearing aids  . History of urinary tract infection   . Hyperlipidemia    under control  . Hypertension    "borderline"  . Nonspecific abnormal finding in stool contents   . Other general symptoms(780.99)   . Prostate hypertrophy    sees Dr. Gaynelle Arabian   . Urinary leakage     Past Surgical History:  Procedure Laterality Date  . CATARACT EXTRACTION W/PHACO Right 03/25/2014   Procedure: CATARACT EXTRACTION PHACO AND INTRAOCULAR LENS PLACEMENT; CDE:9.76;  Surgeon: Williams Che, MD;  Location: [facility];  Service: Ophthalmology;  Laterality: Right;  . CATARACT EXTRACTION W/PHACO Left 06/24/2014   Procedure: CATARACT EXTRACTION PHACO AND INTRAOCULAR LENS PLACEMENT (IOC);  Surgeon: Williams Che, MD;  Location: [facility];  Service: Ophthalmology;  Laterality: Left;  CDE:8.45  . CIRCUMCISION N/A 03/26/2016   Procedure: CIRCUMCISION ADULT;  Surgeon: Carolan Clines, MD;  [facility];  Service: Urology;  Laterality: N/A;  . CIRCUMCISION, NON-NEWBORN  1985  . COLONOSCOPY  06-19-13, 03/05/15   per Dr. Deatra Ina, mass at the ileocecal valve plus several polyps, pathology benign, repeat in one year, polyp 2016   . COLONOSCOPY WITH PROPOFOL N/A 07/29/2015   Procedure: COLONOSCOPY WITH PROPOFOL;  Surgeon: Manus Gunning, MD;  [facility];  Service: Gastroenterology;  Laterality: N/A;  . JOINT REPLACEMENT  12/2006   total left hip per Dr. Percell Miller   . LAPAROSCOPIC PARTIAL COLECTOMY N/A 08/07/2013   Procedure: LAPAROSCOPIC right PARTIAL COLECTOMY;  Surgeon: Leighton Ruff, MD;  Location: [facility];  Service: General;  Laterality: N/A;  . MULTIPLE TOOTH EXTRACTIONS    . PROSTATE SURGERY  03-29-11   had CTT per Dr. Gaynelle Arabian, in office    There were no vitals filed for this visit.                    [place] Adult PT Treatment/Exercise - 02/02/18 0001  Knee/Hip Exercises: Standing   Forward Step Up  Both;10 reps;Step Height: 4";Hand Hold: 2    Functional Squat  1 set;10 reps    Functional Squat Limitations  BUE on // bars      Knee/Hip Exercises: Seated   Long Arc Quad  Strengthening;15 reps    Long Arc Quad Weight  3 lbs.    Other Seated Knee/Hip Exercises  Marching over 6'' hurdle x 10 each LE with intermittent difficulty with clearance    Other Seated Knee/Hip Exercises  heel and toe raises 2x 10    Sit to Sand  2 sets;10 reps   with 1 UE assist         Balance Exercises - 02/02/18 1004      Balance Exercises: Standing   Standing Eyes Opened  Narrow base of support (BOS);Foam/compliant surface;3 reps;30 secs   Intermittent HHA   Gait with Head  Turns  --   inside // bars 4 reps up/down and Lt./Rt.         PT Short Term Goals - 01/24/18 0936      PT SHORT TERM GOAL #1   Title  Patient will report understanding and report regular compliance with HEP for improved balance, strength and functional mobility.     Baseline  01/24/18:  Reports compliance with HEP at least 2-3 times a week    Status  Achieved      PT SHORT TERM GOAL #2   Title  Patient will report ability to ambulate for 10 minutes in order to perform household chores with increased ease.     Baseline  01/24/18:  Reports ability to ambulate 10-15 minutes in store with rollator    Status  Achieved      PT SHORT TERM GOAL #3   Title  Patient will demonstrate improvement on the TUG of 5 seconds or greater with LRAD indicating improved dynamic balance and decreased risk of falls.     Baseline  11/05: TUG with 4 wheeled walker 19.48" was 24.03"    Status  Partially Met        PT Long Term Goals - 01/24/18 1901      PT LONG TERM GOAL #1   Title  Patient will demonstrate an improvement of 8 points on the Berg Balance Scale indicating improved balance and decreased risk of falling.     Baseline  01/24/18:  BERG 23/56 (eval was 20/56)    Time  8    Period  Weeks    Status  On-going      PT LONG TERM GOAL #2   Title  Patient will demonstrate improvement on the TUG of 10 seconds or greater with LRAD indicating improved dynamic balance and decreased risk of falls.     Baseline  01/24/18: TUG 19.48" (was 24.03")    Time  8    Period  Weeks    Status  On-going      PT LONG TERM GOAL #3   Title  Patient will demonstrate improvement on FOTO of at least 10% indicating improved perceived functional mobility.     Baseline  01/24/18: FOTO 51.58% was 40.27%    Status  Achieved            Plan - 02/02/18 1023    Clinical Impression Statement  This session continued with established plan of care. This session added ambulation with head turns to challenge patient's  dynamic balance and work on the vestibular system. Patient required the use  of the parallel bars throughout this exercise to maintain balance. This session also added tandem stance to patient's HEP. Patient would benefit from continued skilled physical therapy in order to reach functional goals.     Rehab Potential  Fair    PT Frequency  2x / week    PT Duration  8 weeks    PT Treatment/Interventions  ADLs/Self Care Home Management;Aquatic Therapy;DME Instruction;Gait training;Stair training;Functional mobility training;Therapeutic activities;Therapeutic exercise;Balance training;Neuromuscular re-education;Patient/family education;Orthotic Fit/Training;Manual techniques;Passive range of motion;Dry needling;Energy conservation;Taping    PT Next Visit Plan  Progress to more dynamic balance and gait activities as able. continue BLE, functional strengthening and balance activities at a support surface as able     PT Home Exercise Plan  12/28/17 - hip ext/abd/flex at counter, heel/toe raises seated; 02/02/18: Tandem stance balance 30 seconds at countertop 1x/day       Patient will benefit from skilled therapeutic intervention in order to improve the following deficits and impairments:  Abnormal gait, Decreased balance, Decreased endurance, Decreased mobility, Difficulty walking, Obesity, Decreased activity tolerance, Decreased strength, Postural dysfunction, Pain  Visit Diagnosis: Repeated falls  Other abnormalities of gait and mobility     Problem List Patient Active Problem List   Diagnosis Date Noted  . Bilateral leg edema 02/07/2017  . Osteoarthritis 01/28/2017  . Urge and stress incontinence 01/28/2017  . History of colonic polyps   . Colon polyp 08/07/2013  . 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 Nonspecific abnormal finding in stool contents 05/29/2013  . Atherosclerotic peripheral vascular disease with intermittent  claudication ([place]) 06/14/2012  . Spinal stenosis of lumbar region 06/14/2012  . 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 PHIMOSIS 01/30/2008  . YEAST BALANITIS 10/17/2007  . Hyperlipidemia 11/23/2006  . OBESITY NOS 11/23/2006  . Depression 11/23/2006  . Essential hypertension 11/23/2006  . HEMATURIA 11/23/2006  . BENIGN PROSTATIC HYPERTROPHY 11/23/2006  . PROSTATITIS NOS 11/23/2006  . OSTEOARTHRITIS, HIP, LEFT 11/23/2006   Clarene Critchley PT, DPT 10:26 AM, 02/02/18 [place] [address] [place], [place], [address] Phone: [phone]   Fax:  [phone]  Name: Gabriel Love MRN: [id number] Date of Birth: 09-02-1940

## 2018-02-02 NOTE — Patient Instructions (Signed)
Tandem Stance    Stand at a countertop. Right foot in front of left, heel touching toe both feet "straight ahead". Stand on Foot Triangle of Support with both feet. Balance in this position _30__ seconds. Do with left foot in front of right.  Copyright  VHI. All rights reserved.

## 2018-02-04 ENCOUNTER — Other Ambulatory Visit: Payer: Self-pay | Admitting: Family Medicine

## 2018-02-05 ENCOUNTER — Other Ambulatory Visit: Payer: Self-pay | Admitting: Family Medicine

## 2018-02-07 ENCOUNTER — Ambulatory Visit (HOSPITAL_COMMUNITY): Payer: Medicare Other

## 2018-02-07 ENCOUNTER — Encounter (HOSPITAL_COMMUNITY): Payer: Self-pay

## 2018-02-07 DIAGNOSIS — R296 Repeated falls: Secondary | ICD-10-CM | POA: Diagnosis not present

## 2018-02-07 DIAGNOSIS — R2689 Other abnormalities of gait and mobility: Secondary | ICD-10-CM

## 2018-02-07 NOTE — Therapy (Signed)
[place], [place], [address] Phone: [phone]   Fax:  [phone]  Physical Therapy Treatment  Patient Details  Name: Gabriel Love MRN: [id number] Date of Birth: Sep 09, 1940 Referring Provider (PT): Alysia Penna, MD   Encounter Date: 02/07/2018  PT End of Session - 02/07/18 0914    Visit Number  11    Number of Visits  17    Date for PT Re-Evaluation  02/21/18   Minireassess complete 01/24/18, visit #7   Authorization Time Period  12/27/17 - 02/21/18    Authorization - Visit Number  11    Authorization - Number of Visits  17    PT Start Time  0904    PT Stop Time  0945    PT Time Calculation (min)  41 min    Equipment Utilized During Treatment  Gait belt    Activity Tolerance  Patient tolerated treatment well;Patient limited by fatigue    Behavior During Therapy  [facility] for tasks assessed/performed       Past Medical History:  Diagnosis Date  . Abdominal hernia   . Allergy   . Blood transfusion without reported diagnosis 12/2006   At [facility] with hip replacement - 2 units transfused  . Cancer ([place])    skin cancers  . Cataract   . Depression   . DJD (degenerative joint disease)    shoulders bilateral  . Environmental allergies   . Full dentures   . Hearing loss    bilateral hearing aids  . History of urinary tract infection   . Hyperlipidemia    under control  . Hypertension    "borderline"  . Nonspecific abnormal finding in stool contents   . Other general symptoms(780.99)   . Prostate hypertrophy    sees Dr. Gaynelle Arabian   . Urinary leakage     Past Surgical History:  Procedure Laterality Date  . CATARACT EXTRACTION W/PHACO Right 03/25/2014   Procedure: CATARACT EXTRACTION PHACO AND INTRAOCULAR LENS PLACEMENT; CDE:9.76;  Surgeon: Williams Che, MD;  Location: [facility];  Service: Ophthalmology;  Laterality: Right;  . CATARACT EXTRACTION W/PHACO Left 06/24/2014   Procedure: CATARACT EXTRACTION PHACO  AND INTRAOCULAR LENS PLACEMENT (IOC);  Surgeon: Williams Che, MD;  Location: [facility];  Service: Ophthalmology;  Laterality: Left;  CDE:8.45  . CIRCUMCISION N/A 03/26/2016   Procedure: CIRCUMCISION ADULT;  Surgeon: Carolan Clines, MD;  Location: [facility];  Service: Urology;  Laterality: N/A;  . CIRCUMCISION, NON-NEWBORN  1985  . COLONOSCOPY  06-19-13, 03/05/15   per Dr. Deatra Ina, mass at the ileocecal valve plus several polyps, pathology benign, repeat in one year, polyp 2016   . COLONOSCOPY WITH PROPOFOL N/A 07/29/2015   Procedure: COLONOSCOPY WITH PROPOFOL;  Surgeon: Manus Gunning, MD;  Location: [facility];  Service: Gastroenterology;  Laterality: N/A;  . JOINT REPLACEMENT  12/2006   total left hip per Dr. Percell Miller   . LAPAROSCOPIC PARTIAL COLECTOMY N/A 08/07/2013   Procedure: LAPAROSCOPIC right PARTIAL COLECTOMY;  Surgeon: Leighton Ruff, MD;  Location: [facility];  Service: General;  Laterality: N/A;  . MULTIPLE TOOTH EXTRACTIONS    . PROSTATE SURGERY  03-29-11   had CTT per Dr. Gaynelle Arabian, in office    There were no vitals filed for this visit.  Subjective Assessment - 02/07/18 0911    Subjective  Pt arrived wearing new pair of shoes, reports more stable.  Has apt with podiatrist tomorrow to trim toe nails.    Pertinent  History  Frequent falls, left total hip replacment, HTN    Patient Stated Goals  To have better balance    Currently in Pain?  No/denies                       [facility] Adult PT Treatment/Exercise - 02/07/18 0001      Knee/Hip Exercises: Standing   Forward Step Up  Both;10 reps;Step Height: 4";Hand Hold: 2    Functional Squat  2 sets;10 reps    Functional Squat Limitations  BUE on // bars    Other Standing Knee Exercises  step taps (4") 2x 20 tpas alt LE with 1 HHA    Other Standing Knee Exercises  sidestep over 6in hurdles, forward step over 12in hurdles 2RT      Knee/Hip Exercises: Seated   Long Arc Quad  Strengthening;15 reps    Long Arc Quad  Weight  4 lbs.    Other Seated Knee/Hip Exercises  heel and toe raises 2x 10    Sit to Sand  2 sets;10 reps   minimal          Balance Exercises - 02/07/18 1146      Balance Exercises: Standing   Tandem Stance  Eyes open;Upper extremity support 1;5 reps;10 secs    Step Over Hurdles / Cones  Sidestepping over 2 6-inch hurdles x 6 each before fatigue; forward over 12in 4RT          PT Short Term Goals - 01/24/18 0936      PT SHORT TERM GOAL #1   Title  Patient will report understanding and report regular compliance with HEP for improved balance, strength and functional mobility.     Baseline  01/24/18:  Reports compliance with HEP at least 2-3 times a week    Status  Achieved      PT SHORT TERM GOAL #2   Title  Patient will report ability to ambulate for 10 minutes in order to perform household chores with increased ease.     Baseline  01/24/18:  Reports ability to ambulate 10-15 minutes in store with rollator    Status  Achieved      PT SHORT TERM GOAL #3   Title  Patient will demonstrate improvement on the TUG of 5 seconds or greater with LRAD indicating improved dynamic balance and decreased risk of falls.     Baseline  11/05: TUG with 4 wheeled walker 19.48" was 24.03"    Status  Partially Met        PT Long Term Goals - 01/24/18 1901      PT LONG TERM GOAL #1   Title  Patient will demonstrate an improvement of 8 points on the Berg Balance Scale indicating improved balance and decreased risk of falling.     Baseline  01/24/18:  BERG 23/56 (eval was 20/56)    Time  8    Period  Weeks    Status  On-going      PT LONG TERM GOAL #2   Title  Patient will demonstrate improvement on the TUG of 10 seconds or greater with LRAD indicating improved dynamic balance and decreased risk of falls.     Baseline  01/24/18: TUG 19.48" (was 24.03")    Time  8    Period  Weeks    Status  On-going      PT LONG TERM GOAL #3   Title  Patient will demonstrate improvement on FOTO of  at  least 10% indicating improved perceived functional mobility.     Baseline  01/24/18: FOTO 51.58% was 40.27%    Status  Achieved            Plan - 02/07/18 1046    Clinical Impression Statement  Continued session focus with strengthening and balance training.  Pt presents with hip strenghtening progress noted by improved mechanics with squats and ability to complete sit to stands without HHA this session.  Pt continues to fatigue quickly requiring seated rest breaks and knee buckling (1 episode this session) and requires HHA or min A for LOB during partial tandem stance for safety.  NO reports of pain through session, was limited by fatigue.    Rehab Potential  Fair    Clinical Impairments Affecting Rehab Potential  Positive: Support system; Negative: comorbidities    PT Duration  8 weeks    PT Treatment/Interventions  ADLs/Self Care Home Management;Aquatic Therapy;DME Instruction;Gait training;Stair training;Functional mobility training;Therapeutic activities;Therapeutic exercise;Balance training;Neuromuscular re-education;Patient/family education;Orthotic Fit/Training;Manual techniques;Passive range of motion;Dry needling;Energy conservation;Taping    PT Next Visit Plan  Progress to more dynamic balance and gait activities as able. continue BLE, functional strengthening and balance activities at a support surface as able     PT Home Exercise Plan  12/28/17 - hip ext/abd/flex at counter, heel/toe raises seated; 02/02/18: Tandem stance balance 30 seconds at countertop 1x/day       Patient will benefit from skilled therapeutic intervention in order to improve the following deficits and impairments:  Abnormal gait, Decreased balance, Decreased endurance, Decreased mobility, Difficulty walking, Obesity, Decreased activity tolerance, Decreased strength, Postural dysfunction, Pain  Visit Diagnosis: Repeated falls  Other abnormalities of gait and mobility     Problem List Patient Active  Problem List   Diagnosis Date Noted  . Bilateral leg edema 02/07/2017  . Osteoarthritis 01/28/2017  . Urge and stress incontinence 01/28/2017  . History of colonic polyps   . Colon polyp 08/07/2013  . 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 Nonspecific abnormal finding in stool contents 05/29/2013  . Atherosclerotic peripheral vascular disease with intermittent claudication ([place]) 06/14/2012  . Spinal stenosis of lumbar region 06/14/2012  . 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 PHIMOSIS 01/30/2008  . YEAST BALANITIS 10/17/2007  . Hyperlipidemia 11/23/2006  . OBESITY NOS 11/23/2006  . Depression 11/23/2006  . Essential hypertension 11/23/2006  . HEMATURIA 11/23/2006  . BENIGN PROSTATIC HYPERTROPHY 11/23/2006  . PROSTATITIS NOS 11/23/2006  . OSTEOARTHRITIS, HIP, LEFT 11/23/2006   Ihor Austin, [place]; [place]  Aldona Lento 02/07/2018, 11:51 AM  [place], [place], [address] Phone: [phone]   Fax:  [phone]  Name: JEREMYAH JELLEY MRN: [id number] Date of Birth: 1940/06/10

## 2018-02-08 ENCOUNTER — Encounter: Payer: Self-pay | Admitting: Podiatry

## 2018-02-08 ENCOUNTER — Ambulatory Visit: Payer: Medicare Other | Admitting: Podiatry

## 2018-02-08 VITALS — BP 157/80

## 2018-02-08 DIAGNOSIS — M79675 Pain in left toe(s): Secondary | ICD-10-CM | POA: Diagnosis not present

## 2018-02-08 DIAGNOSIS — R600 Localized edema: Secondary | ICD-10-CM

## 2018-02-08 DIAGNOSIS — M205X1 Other deformities of toe(s) (acquired), right foot: Secondary | ICD-10-CM

## 2018-02-08 DIAGNOSIS — B351 Tinea unguium: Secondary | ICD-10-CM | POA: Diagnosis not present

## 2018-02-08 DIAGNOSIS — M79674 Pain in right toe(s): Secondary | ICD-10-CM | POA: Diagnosis not present

## 2018-02-08 NOTE — Patient Instructions (Addendum)
PURCHASE SKECHERS WITH STRETCHABLE UPPERS  CONSULT PCP REGARDING WHAT COMPRESSION THEY'D LIKE FOR COMPRESSION HOSE    Onychomycosis/Fungal Toenails  WHAT IS IT? An infection that lies within the keratin of your nail plate that is caused by a fungus.  WHY ME? Fungal infections affect all ages, sexes, races, and creeds.  There may be many factors that predispose you to a fungal infection such as age, coexisting medical conditions such as diabetes, or an autoimmune disease; stress, medications, fatigue, genetics, etc.  Bottom line: fungus thrives in a warm, moist environment and your shoes offer such a location.  IS IT CONTAGIOUS? Theoretically, yes.  You do not want to share shoes, nail clippers or files with someone who has fungal toenails.  Walking around barefoot in the same room or sleeping in the same bed is unlikely to transfer the organism.  It is important to realize, however, that fungus can spread easily from one nail to the next on the same foot.  HOW DO WE TREAT THIS?  There are several ways to treat this condition.  Treatment may depend on many factors such as age, medications, pregnancy, liver and kidney conditions, etc.  It is best to ask your doctor which options are available to you.  1. No treatment.   Unlike many other medical concerns, you can live with this condition.  However for many people this can be a painful condition and may lead to ingrown toenails or a bacterial infection.  It is recommended that you keep the nails cut short to help reduce the amount of fungal nail. 2. Topical treatment.  These range from herbal remedies to prescription strength nail lacquers.  About 40-50% effective, topicals require twice daily application for approximately 9 to 12 months or until an entirely new nail has grown out.  The most effective topicals are medical grade medications available through physicians offices. 3. Oral antifungal medications.  With an 80-90% cure rate, the most common  oral medication requires 3 to 4 months of therapy and stays in your system for a year as the new nail grows out.  Oral antifungal medications do require blood work to make sure it is a safe drug for you.  A liver function panel will be performed prior to starting the medication and after the first month of treatment.  It is important to have the blood work performed to avoid any harmful side effects.  In general, this medication safe but blood work is required. 4. Laser Therapy.  This treatment is performed by applying a specialized laser to the affected nail plate.  This therapy is noninvasive, fast, and non-painful.  It is not covered by insurance and is therefore, out of pocket.  The results have been very good with a 80-95% cure rate.  The [facility] is the only practice in the area to offer this therapy. Permanent Nail Avulsion.  Removing the entire nail so that a new nail will not grow back.  Edema Edema is an abnormal buildup of fluids in your bodytissues. Edema is somewhatdependent on gravity to pull the fluid to the lowest place in your body. That makes the condition more common in the legs and thighs (lower extremities). Painless swelling of the feet and ankles is common and becomes more likely as you get older. It is also common in looser tissues, like around your eyes. When the affected area is squeezed, the fluid may move out of that spot and leave a dent for a few moments. This  dent is called pitting. What are the causes? There are many possible causes of edema. Eating too much salt and being on your feet or sitting for a long time can cause edema in your legs and ankles. Hot weather may make edema worse. Common medical causes of edema include:  Heart failure.  Liver disease.  Kidney disease.  Weak blood vessels in your legs.  Cancer.  An injury.  Pregnancy.  Some medications.  Obesity.  What are the signs or symptoms? Edema is usually painless.Your skin may look  swollen or shiny. How is this diagnosed? Your health care provider may be able to diagnose edema by asking about your medical history and doing a physical exam. You may need to have tests such as X-rays, an electrocardiogram, or blood tests to check for medical conditions that may cause edema. How is this treated? Edema treatment depends on the cause. If you have heart, liver, or kidney disease, you need the treatment appropriate for these conditions. General treatment may include:  Elevation of the affected body part above the level of your heart.  Compression of the affected body part. Pressure from elastic bandages or support stockings squeezes the tissues and forces fluid back into the blood vessels. This keeps fluid from entering the tissues.  Restriction of fluid and salt intake.  Use of a water pill (diuretic). These medications are appropriate only for some types of edema. They pull fluid out of your body and make you urinate more often. This gets rid of fluid and reduces swelling, but diuretics can have side effects. Only use diuretics as directed by your health care provider.  Follow these instructions at home:  Keep the affected body part above the level of your heart when you are lying down.  Do not sit still or stand for prolonged periods.  Do not put anything directly under your knees when lying down.  Do not wear constricting clothing or garters on your upper legs.  Exercise your legs to work the fluid back into your blood vessels. This may help the swelling go down.  Wear elastic bandages or support stockings to reduce ankle swelling as directed by your health care provider.  Eat a low-salt diet to reduce fluid if your health care provider recommends it.  Only take medicines as directed by your health care provider. Contact a health care provider if:  Your edema is not responding to treatment.  You have heart, liver, or kidney disease and notice symptoms of  edema.  You have edema in your legs that does not improve after elevating them.  You have sudden and unexplained weight gain. Get help right away if:  You develop shortness of breath or chest pain.  You cannot breathe when you lie down.  You develop pain, redness, or warmth in the swollen areas.  You have heart, liver, or kidney disease and suddenly get edema.  You have a fever and your symptoms suddenly get worse. This information is not intended to replace advice given to you by your health care provider. Make sure you discuss any questions you have with your health care provider. Document Released: 03/08/2005 Document Revised: 08/14/2015 Document Reviewed: 12/29/2012 Elsevier Interactive Patient Education  2017 [employer]. 5.

## 2018-02-08 NOTE — Progress Notes (Signed)
Subjective: Gabriel Love presents today with accompanied by his wife.  He relates cc of painful, discolored, thick toenails which interfere with daily activities and routine tasks.  Pain is aggravated when wearing enclosed shoe gear. Pain is getting progressively worse.  He states he tried to get his wife to cut his toenails and she refuses.  He also has a history of chronic lower extremity cellulitis.   Wife is concerned that he is walking on the outside of his right foot.  He does relate having left hip surgery in the right leg being longer than the left leg.  He has sustained a couple of falls in the past couple of months and he is currently in physical therapy for this.   Past Medical History:  Diagnosis Date  . Abdominal hernia   . Allergy   . Blood transfusion without reported diagnosis 12/2006   At [facility] with hip replacement - 2 units transfused  . Cancer ([place])    skin cancers  . Cataract   . Depression   . DJD (degenerative joint disease)    shoulders bilateral  . Environmental allergies   . Full dentures   . Hearing loss    bilateral hearing aids  . History of urinary tract infection   . Hyperlipidemia    under control  . Hypertension    "borderline"  . Nonspecific abnormal finding in stool contents   . Other general symptoms(780.99)   . Prostate hypertrophy    sees Dr. Gaynelle Arabian   . Urinary leakage    Patient Active Problem List   Diagnosis Date Noted  . Bilateral leg edema 02/07/2017  . Osteoarthritis 01/28/2017  . Urge and stress incontinence 01/28/2017  . History of colonic polyps   . Colon polyp 08/07/2013  . 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 Nonspecific abnormal finding in stool contents 05/29/2013  . Atherosclerotic peripheral vascular disease with intermittent claudication ([place]) 06/14/2012  . Spinal stenosis of lumbar region 06/14/2012  . DEGENERATIVE DISC DISEASE, LUMBAR SPINE  12/02/2009  . PHIMOSIS 01/30/2008  . YEAST BALANITIS 10/17/2007  . Hyperlipidemia 11/23/2006  . OBESITY NOS 11/23/2006  . Depression 11/23/2006  . Essential hypertension 11/23/2006  . HEMATURIA 11/23/2006  . BENIGN PROSTATIC HYPERTROPHY 11/23/2006  . PROSTATITIS NOS 11/23/2006  . OSTEOARTHRITIS, HIP, LEFT 11/23/2006   Past Surgical History:  Procedure Laterality Date  . CATARACT EXTRACTION W/PHACO Right 03/25/2014   Procedure: CATARACT EXTRACTION PHACO AND INTRAOCULAR LENS PLACEMENT; CDE:9.76;  Surgeon: Williams Che, MD;  Location: [facility];  Service: Ophthalmology;  Laterality: Right;  . CATARACT EXTRACTION W/PHACO Left 06/24/2014   Procedure: CATARACT EXTRACTION PHACO AND INTRAOCULAR LENS PLACEMENT (IOC);  Surgeon: Williams Che, MD;  Location: [facility];  Service: Ophthalmology;  Laterality: Left;  CDE:8.45  . CIRCUMCISION N/A 03/26/2016   Procedure: CIRCUMCISION ADULT;  Surgeon: Carolan Clines, MD;  Location: [facility];  Service: Urology;  Laterality: N/A;  . CIRCUMCISION, NON-NEWBORN  1985  . COLONOSCOPY  06-19-13, 03/05/15   per Dr. Deatra Ina, mass at the ileocecal valve plus several polyps, pathology benign, repeat in one year, polyp 2016   . COLONOSCOPY WITH PROPOFOL N/A 07/29/2015   Procedure: COLONOSCOPY WITH PROPOFOL;  Surgeon: Manus Gunning, MD;  [facility];  Service: Gastroenterology;  Laterality: N/A;  . JOINT REPLACEMENT  12/2006   total left hip per Dr. Percell Miller   . LAPAROSCOPIC PARTIAL COLECTOMY N/A 08/07/2013   Procedure: LAPAROSCOPIC right PARTIAL COLECTOMY;  Surgeon: Leighton Ruff, MD;  Location: [facility];  Service: General;  Laterality: N/A;  . MULTIPLE TOOTH EXTRACTIONS    . PROSTATE SURGERY  03-29-11   had CTT per Dr. Gaynelle Arabian, in office   Current Outpatient Medications:  .  albuterol (PROVENTIL HFA;VENTOLIN HFA) 108 (90 Base) MCG/ACT inhaler, Inhale 2 puffs every 4 (four) hours as needed into the lungs for wheezing or shortness of breath., Disp: 1  Inhaler, Rfl: 11 .  aspirin EC 81 MG tablet, Take 81 mg by mouth daily., Disp: , Rfl:  .  atorvastatin (LIPITOR) 40 MG tablet, TAKE 1 TABLET BY MOUTH ONCE DAILY, Disp: 90 tablet, Rfl: 1 .  clotrimazole-betamethasone (LOTRISONE) cream, Apply 1 application topically daily. Applies to affected area., Disp: , Rfl: 0 .  furosemide (LASIX) 40 MG tablet, TAKE 1 TABLET BY MOUTH ONCE DAILY, Disp: 90 tablet, Rfl: 1 .  LEXAPRO 20 MG tablet, Take 1 tablet (20 mg total) by mouth daily., Disp: 30 tablet, Rfl: 11 .  metoprolol succinate (TOPROL-XL) 50 MG 24 hr tablet, Take 1 tablet (50 mg total) by mouth daily. Take with or immediately following a meal., Disp: 30 tablet, Rfl: 3 .  solifenacin (VESICARE) 5 MG tablet, Take 1 tablet (5 mg total) daily by mouth., Disp: 90 tablet, Rfl: 3 .  traMADol-acetaminophen (ULTRACET) 37.5-325 MG tablet, Take 1 tablet by mouth every 6 (six) hours as needed., Disp: 30 tablet, Rfl: 0  Allergies  Allergen Reactions  . Ivp Dye [Iodinated Diagnostic Agents] Anaphylaxis    Iv dye allergy in 1985, anaphylaxix//a.calhoun- "shocked back to life"  . Lisinopril Cough  . Penicillins Other (See Comments)    Urticaria Has patient had a PCN reaction causing immediate rash, facial/tongue/throat swelling, SOB or lightheadedness with hypotension: no Has patient had a PCN reaction causing severe rash involving mucus membranes or skin necrosis: no Has patient had a PCN reaction that required hospitalization no Has patient had a PCN reaction occurring within the last 10 years: yes If all of the above answers are "NO", then may proceed with Cephalosporin use.    Social History   Tobacco Use  Smoking Status Former Smoker  . Packs/day: 1.00  . Years: 5.00  . Pack years: 5.00  . Types: Cigarettes  . Last attempt to quit: 03/22/1968  . Years since quitting: 49.9  Smokeless Tobacco Former Systems developer  . Types: Chew  . Quit date: 09/20/2015  Tobacco Comment   occassionally   Family History   Problem Relation Age of Onset  . Hypertension Mother   . Diabetes Mother   . Heart failure Mother        cad/mi with rupture ventricle  . Hyperlipidemia Father   . Colon polyps Father   . Cancer Neg Hx        colon or prostate  . Colon cancer Neg Hx   . Esophageal cancer Neg Hx   . Rectal cancer Neg Hx   . Stomach cancer Neg Hx    Review of systems:  Constitutional: Denies chills, fatigue, fever, sweats, weight change Communication: Optometrist, sign Ecologist, hand writing, iPad/Android device Eyes: Denies diplopia, glare,  light sensitivity, eyeglasses Ears nose mouth throat: Hard of hearing, deaf, sign language, Denies vertigo,  denies bloody nose, denies rhinitis, denies cold sores, denies snoring Cardiovascular: +HTN, +edema, arrhythmia, pacemaker in place, defibrillator in place, Denies chest pain/tightness, chronic anticoagulation, blood clot Respiratory: Denies difficulty breathing, denies congestion Gastrointestinal: Denies abdominal pain, denies diarrhea, denies nausea, denies vomiting Genitourinary: Denies nocturia,  denies pain on urination, denies blood in urine Musculoskeletal: Uses mobility aid, Denies cramping, stiff joints, painful joints,  Skin: +changes in toenails, +frequent bouts of lower extremity cellulitis,  denies color change dryness, itchy skin, mole changes, or rash  Neurological: denies numbness, paresthesias, burning in feet, denies fainting, denies seizure, denies change in speech. denies headaches, memory problems/poor historian, cerebral palsy Endocrine: Denies dry mouth, denies flushing, denies heat intolerance, denies cold intolerance, denies excessive thirst, denies polyuria, denies nocturia Hematological: Denies easy bleeding, denies excessive bleeding, denies easy bruising, denies enlarged lymph nodes Allergy/immunological: Denies hives denies frequent infections Psychiatric:  anxiety, depression, mood disorder, suicidal ideations,  hallucinations  Objective: General: 77 year old white male morbidly obese in no acute distress.  Alert, awake, and oriented x3. Vitals:   02/08/18 0904  BP: (!) 157/80    Vascular Examination: Capillary refill time is immediate to all 10 digits.  Dorsalis pedis pulses 2/4 bilaterally, posterior tibial pulses nonpalpable secondary to edema of bilateral ankles.  Skin temperature gradient within normal limits bilaterally.  He has +2 pitting edema noted bilateral lower extremities.  Dermatological Examination: Skin mildly atrophic with mild amounts varicosities noted bilaterally.   Toenails 1-5 b/l discolored, thick, dystrophic with subungual debris and pain with palpation to nailbeds due to thickness of nails. +2 pitting edema as noted above vascular examination with no cellulitis present on today.  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 Utilizes a Radiation protection practitioner for mobility assistance.  Neurological: Sensation intact with 10 gram monofilament. Vibratory sensation intact.  Assessment: 1. Painful onychomycosis toenails 1-5 b/l   2.  Lower extremity edema bilaterally. 3.  Claw toe right great toe  Plan: 1. Discussed his falls on today dispensed literature regarding Moore balance brace.  He is to discuss the [facility] balance brace with his physical therapist 2. Advised him to purchase sketchers with stretchable uppers to accommodate claw toe deformity 3. Regarding compression hose I asked him to discuss with her PCP regarding which compression they would like for him to have. 4. Toenails 1-5 b/l were debrided in length and girth without iatrogenic bleeding. 5. Patient to continue soft, supportive shoe gear 6. Patient to report any pedal injuries to medical professional immediately. 7. Follow up 3 months. Patient/POA to call should there be a concern in the interim.

## 2018-02-09 ENCOUNTER — Encounter (HOSPITAL_COMMUNITY): Payer: Self-pay | Admitting: Physical Therapy

## 2018-02-09 ENCOUNTER — Ambulatory Visit (HOSPITAL_COMMUNITY): Payer: Medicare Other | Admitting: Physical Therapy

## 2018-02-09 DIAGNOSIS — R296 Repeated falls: Secondary | ICD-10-CM

## 2018-02-09 DIAGNOSIS — R2689 Other abnormalities of gait and mobility: Secondary | ICD-10-CM

## 2018-02-09 NOTE — Therapy (Signed)
[place], [place], [address] Phone: [phone]   Fax:  [phone]  Physical Therapy Treatment  Patient Details  Name: Gabriel Love MRN: [id number] Date of Birth: [birth year] Referring Provider (PT): Alysia Penna, MD   Encounter Date: 02/09/2018  PT End of Session - 02/09/18 0950    Visit Number  12    Number of Visits  17    Date for PT Re-Evaluation  02/21/18   Minireassess complete 01/24/18, visit #7   Authorization Time Period  12/27/17 - 02/21/18    Authorization - Visit Number  12    Authorization - Number of Visits  17    PT Start Time  0939    PT Stop Time  1018    PT Time Calculation (min)  39 min    Equipment Utilized During Treatment  Gait belt    Activity Tolerance  Patient tolerated treatment well;Patient limited by fatigue    Behavior During Therapy  [facility] for tasks assessed/performed       Past Medical History:  Diagnosis Date  . Abdominal hernia   . Allergy   . Blood transfusion without reported diagnosis 12/2006   At [facility] with hip replacement - 2 units transfused  . Cancer ([place])    skin cancers  . Cataract   . Depression   . DJD (degenerative joint disease)    shoulders bilateral  . Environmental allergies   . Full dentures   . Hearing loss    bilateral hearing aids  . History of urinary tract infection   . Hyperlipidemia    under control  . Hypertension    "borderline"  . Nonspecific abnormal finding in stool contents   . Other general symptoms(780.99)   . Prostate hypertrophy    sees Dr. Gaynelle Arabian   . Urinary leakage     Past Surgical History:  Procedure Laterality Date  . CATARACT EXTRACTION W/PHACO Right 03/25/2014   Procedure: CATARACT EXTRACTION PHACO AND INTRAOCULAR LENS PLACEMENT; CDE:9.76;  Surgeon: Williams Che, MD;  Location: [facility];  Service: Ophthalmology;  Laterality: Right;  . CATARACT EXTRACTION W/PHACO Left 06/24/2014   Procedure: CATARACT EXTRACTION PHACO  AND INTRAOCULAR LENS PLACEMENT (IOC);  Surgeon: Williams Che, MD;  Location: [facility];  Service: Ophthalmology;  Laterality: Left;  CDE:8.45  . CIRCUMCISION N/A 03/26/2016   Procedure: CIRCUMCISION ADULT;  Surgeon: Carolan Clines, MD;  Location: [facility];  Service: Urology;  Laterality: N/A;  . CIRCUMCISION, NON-NEWBORN  1985  . COLONOSCOPY  06-19-13, 03/05/15   per Dr. Deatra Ina, mass at the ileocecal valve plus several polyps, pathology benign, repeat in one year, polyp 2016   . COLONOSCOPY WITH PROPOFOL N/A 07/29/2015   Procedure: COLONOSCOPY WITH PROPOFOL;  Surgeon: Manus Gunning, MD;  [facility];  Service: Gastroenterology;  Laterality: N/A;  . JOINT REPLACEMENT  12/2006   total left hip per Dr. Percell Miller   . LAPAROSCOPIC PARTIAL COLECTOMY N/A 08/07/2013   Procedure: LAPAROSCOPIC right PARTIAL COLECTOMY;  Surgeon: Leighton Ruff, MD;  Location: [facility];  Service: General;  Laterality: N/A;  . MULTIPLE TOOTH EXTRACTIONS    . PROSTATE SURGERY  03-29-11   had CTT per Dr. Gaynelle Arabian, in office    There were no vitals filed for this visit.  Subjective Assessment - 02/09/18 0946    Subjective  Patient reported that he saw the foot doctor yesterday and they recommended compression garments.     Pertinent History  Frequent falls, left  total hip replacment, HTN    Patient Stated Goals  To have better balance    Currently in Pain?  Yes    Pain Score  8     Pain Location  Shoulder    Pain Orientation  Right    Pain Descriptors / Indicators  Sharp    Pain Type  Chronic pain    Pain Onset  Other (comment)   Has had this shoulder arthritis pain for many years                      [facility] Adult PT Treatment/Exercise - 02/09/18 0001      Exercises   Exercises  Knee/Hip      Knee/Hip Exercises: Standing   Knee Flexion  2 sets;10 reps    Knee Flexion Limitations  Inside // bars bilateral HHA. To improve stepping over.      Forward Step Up  Both;10 reps;Step Height:  4";Hand Hold: 2    Other Standing Knee Exercises  step taps (4") 2x 20 tpas alt LE with 1 HHA    Other Standing Knee Exercises  sidestep over (3) 6in hurdles, forward step over (3) 12in hurdles 2RT inside // bars      Knee/Hip Exercises: Seated   Long Arc Quad  Strengthening;15 reps    Long Arc Quad Weight  4 lbs.    Other Seated Knee/Hip Exercises  heel and toe raises 2x 10    Sit to Sand  2 sets;10 reps   Bilateral Upper extremity assistance         Balance Exercises - 02/09/18 1022      Balance Exercises: Standing   Gait with Head Turns  --   inside // bars 2 reps head turns up/down, right/left          PT Short Term Goals - 01/24/18 0936      PT SHORT TERM GOAL #1   Title  Patient will report understanding and report regular compliance with HEP for improved balance, strength and functional mobility.     Baseline  01/24/18:  Reports compliance with HEP at least 2-3 times a week    Status  Achieved      PT SHORT TERM GOAL #2   Title  Patient will report ability to ambulate for 10 minutes in order to perform household chores with increased ease.     Baseline  01/24/18:  Reports ability to ambulate 10-15 minutes in store with rollator    Status  Achieved      PT SHORT TERM GOAL #3   Title  Patient will demonstrate improvement on the TUG of 5 seconds or greater with LRAD indicating improved dynamic balance and decreased risk of falls.     Baseline  11/05: TUG with 4 wheeled walker 19.48" was 24.03"    Status  Partially Met        PT Long Term Goals - 01/24/18 1901      PT LONG TERM GOAL #1   Title  Patient will demonstrate an improvement of 8 points on the Berg Balance Scale indicating improved balance and decreased risk of falling.     Baseline  01/24/18:  BERG 23/56 (eval was 20/56)    Time  8    Period  Weeks    Status  On-going      PT LONG TERM GOAL #2   Title  Patient will demonstrate improvement on the TUG of 10 seconds or greater with LRAD  indicating  improved dynamic balance and decreased risk of falls.     Baseline  01/24/18: TUG 19.48" (was 24.03")    Time  8    Period  Weeks    Status  On-going      PT LONG TERM GOAL #3   Title  Patient will demonstrate improvement on FOTO of at least 10% indicating improved perceived functional mobility.     Baseline  01/24/18: FOTO 51.58% was 40.27%    Status  Achieved            Plan - 02/09/18 1028    Clinical Impression Statement  This session focused on lower extremity functional strengthening and balance. This session resumed dynamic balance with head turns. With stepping over hurdles this session, noted that patient repeated caught his back foot on the hurdle when stepping over and therefore incorporated knee flexion strengthening to improve foot clearance with this. Plan to send referral to primary care physician for compression garments as noted swelling in bilateral lower extremities and per patient reported recommendation of foot physician.     Rehab Potential  Fair    Clinical Impairments Affecting Rehab Potential  Positive: Support system; Negative: comorbidities    PT Duration  8 weeks    PT Treatment/Interventions  ADLs/Self Care Home Management;Aquatic Therapy;DME Instruction;Gait training;Stair training;Functional mobility training;Therapeutic activities;Therapeutic exercise;Balance training;Neuromuscular re-education;Patient/family education;Orthotic Fit/Training;Manual techniques;Passive range of motion;Dry needling;Energy conservation;Taping    PT Next Visit Plan  Progress to more dynamic balance and gait activities as able. continue BLE, functional strengthening and balance activities at a support surface as able. Provide patient with compression garment referral once signed.      PT Home Exercise Plan  12/28/17 - hip ext/abd/flex at counter, heel/toe raises seated; 02/02/18: Tandem stance balance 30 seconds at countertop 1x/day       Patient will benefit from skilled  therapeutic intervention in order to improve the following deficits and impairments:  Abnormal gait, Decreased balance, Decreased endurance, Decreased mobility, Difficulty walking, Obesity, Decreased activity tolerance, Decreased strength, Postural dysfunction, Pain  Visit Diagnosis: Repeated falls  Other abnormalities of gait and mobility     Problem List Patient Active Problem List   Diagnosis Date Noted  . Bilateral leg edema 02/07/2017  . Osteoarthritis 01/28/2017  . Urge and stress incontinence 01/28/2017  . History of colonic polyps   . Colon polyp 08/07/2013  . 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 Nonspecific abnormal finding in stool contents 05/29/2013  . Atherosclerotic peripheral vascular disease with intermittent claudication ([place]) 06/14/2012  . Spinal stenosis of lumbar region 06/14/2012  . 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 PHIMOSIS 01/30/2008  . YEAST BALANITIS 10/17/2007  . Hyperlipidemia 11/23/2006  . OBESITY NOS 11/23/2006  . Depression 11/23/2006  . Essential hypertension 11/23/2006  . HEMATURIA 11/23/2006  . BENIGN PROSTATIC HYPERTROPHY 11/23/2006  . PROSTATITIS NOS 11/23/2006  . OSTEOARTHRITIS, HIP, LEFT 11/23/2006   Clarene Critchley PT, DPT 10:29 AM, 02/09/18 [place] [address] [place], [place], [address] Phone: [phone]   Fax:  [phone]  Name: Gabriel Love MRN: [id number] Date of Birth: Nov 25, 1940

## 2018-02-13 ENCOUNTER — Ambulatory Visit (HOSPITAL_COMMUNITY): Payer: Medicare Other

## 2018-02-13 ENCOUNTER — Encounter (HOSPITAL_COMMUNITY): Payer: Self-pay

## 2018-02-13 DIAGNOSIS — R2689 Other abnormalities of gait and mobility: Secondary | ICD-10-CM

## 2018-02-13 DIAGNOSIS — R296 Repeated falls: Secondary | ICD-10-CM

## 2018-02-13 NOTE — Therapy (Signed)
[place], [place], [address] Phone: [phone]   Fax:  [phone]  Physical Therapy Treatment  Patient Details  Name: Gabriel Love MRN: [id number] Date of Birth: [birth year] Referring Provider (PT): Alysia Penna, MD   Encounter Date: 02/13/2018  PT End of Session - 02/13/18 0901    Visit Number  13    Number of Visits  17    Date for PT Re-Evaluation  02/21/18   Minireassess complete 01/24/18, visit #7   Authorization Time Period  12/27/17 - 02/21/18    Authorization - Visit Number  62    Authorization - Number of Visits  17    PT Start Time  0901    PT Stop Time  0941    PT Time Calculation (min)  40 min    Equipment Utilized During Treatment  Gait belt    Activity Tolerance  Patient tolerated treatment well;Patient limited by fatigue    Behavior During Therapy  [facility] for tasks assessed/performed       Past Medical History:  Diagnosis Date  . Abdominal hernia   . Allergy   . Blood transfusion without reported diagnosis 12/2006   At [facility] with hip replacement - 2 units transfused  . Cancer ([place])    skin cancers  . Cataract   . Depression   . DJD (degenerative joint disease)    shoulders bilateral  . Environmental allergies   . Full dentures   . Hearing loss    bilateral hearing aids  . History of urinary tract infection   . Hyperlipidemia    under control  . Hypertension    "borderline"  . Nonspecific abnormal finding in stool contents   . Other general symptoms(780.99)   . Prostate hypertrophy    sees Dr. Gaynelle Arabian   . Urinary leakage     Past Surgical History:  Procedure Laterality Date  . CATARACT EXTRACTION W/PHACO Right 03/25/2014   Procedure: CATARACT EXTRACTION PHACO AND INTRAOCULAR LENS PLACEMENT; CDE:9.76;  Surgeon: Williams Che, MD;  Location: [facility];  Service: Ophthalmology;  Laterality: Right;  . CATARACT EXTRACTION W/PHACO Left 06/24/2014   Procedure: CATARACT EXTRACTION PHACO  AND INTRAOCULAR LENS PLACEMENT (IOC);  Surgeon: Williams Che, MD;  Location: [facility];  Service: Ophthalmology;  Laterality: Left;  CDE:8.45  . CIRCUMCISION N/A 03/26/2016   Procedure: CIRCUMCISION ADULT;  Surgeon: Carolan Clines, MD;  Location: [facility];  Service: Urology;  Laterality: N/A;  . CIRCUMCISION, NON-NEWBORN  1985  . COLONOSCOPY  06-19-13, 03/05/15   per Dr. Deatra Ina, mass at the ileocecal valve plus several polyps, pathology benign, repeat in one year, polyp 2016   . COLONOSCOPY WITH PROPOFOL N/A 07/29/2015   Procedure: COLONOSCOPY WITH PROPOFOL;  Surgeon: Manus Gunning, MD;  [facility];  Service: Gastroenterology;  Laterality: N/A;  . JOINT REPLACEMENT  12/2006   total left hip per Dr. Percell Miller   . LAPAROSCOPIC PARTIAL COLECTOMY N/A 08/07/2013   Procedure: LAPAROSCOPIC right PARTIAL COLECTOMY;  Surgeon: Leighton Ruff, MD;  Location: [facility];  Service: General;  Laterality: N/A;  . MULTIPLE TOOTH EXTRACTIONS    . PROSTATE SURGERY  03-29-11   had CTT per Dr. Gaynelle Arabian, in office    There were no vitals filed for this visit.  Subjective Assessment - 02/13/18 0904    Subjective  Pt states that he's doing well this morning.     Pertinent History  Frequent falls, left total hip replacment, HTN  Patient Stated Goals  To have better balance    Currently in Pain?  No/denies    Pain Onset  Other (comment)   Has had this shoulder arthritis pain for many years         [facility] Adult PT Treatment/Exercise - 02/13/18 0001      Exercises   Exercises  Knee/Hip      Knee/Hip Exercises: Standing   Heel Raises  Both;15 reps    Heel Raises Limitations  increased difficulty    Hip Flexion  Both;15 reps    Hip Flexion Limitations  1 UE assist    Forward Lunges  Both;10 reps    Forward Lunges Limitations  front foot on 6" step    Hip Abduction  Both;15 reps    Abduction Limitations  RTB    Forward Step Up  Both;10 reps;Step Height: 6";Hand Hold: 2    Functional Squat   10 reps;5 seconds    Functional Squat Limitations  BUE on // bars      Knee/Hip Exercises: Seated   Long Arc Quad  Strengthening;15 reps    Long Arc Quad Weight  3 lbs.    Other Seated Knee/Hip Exercises  heel and toe raises x20 with 5# weight      Balance Exercises - 02/13/18 0927      Balance Exercises: Standing   Standing Eyes Opened  Narrow base of support (BOS);Foam/compliant surface;5 reps   velcro ball toss x5RT   Partial Tandem Stance  Eyes open;Intermittent upper extremity support;3 reps;10 secs   each foot forward; +R/L head turns x10 reps each foot fwd   Sit to Stand Time  NBOS foam 5x10" each             PT Short Term Goals - 01/24/18 0936      PT SHORT TERM GOAL #1   Title  Patient will report understanding and report regular compliance with HEP for improved balance, strength and functional mobility.     Baseline  01/24/18:  Reports compliance with HEP at least 2-3 times a week    Status  Achieved      PT SHORT TERM GOAL #2   Title  Patient will report ability to ambulate for 10 minutes in order to perform household chores with increased ease.     Baseline  01/24/18:  Reports ability to ambulate 10-15 minutes in store with rollator    Status  Achieved      PT SHORT TERM GOAL #3   Title  Patient will demonstrate improvement on the TUG of 5 seconds or greater with LRAD indicating improved dynamic balance and decreased risk of falls.     Baseline  11/05: TUG with 4 wheeled walker 19.48" was 24.03"    Status  Partially Met        PT Long Term Goals - 01/24/18 1901      PT LONG TERM GOAL #1   Title  Patient will demonstrate an improvement of 8 points on the Berg Balance Scale indicating improved balance and decreased risk of falling.     Baseline  01/24/18:  BERG 23/56 (eval was 20/56)    Time  8    Period  Weeks    Status  On-going      PT LONG TERM GOAL #2   Title  Patient will demonstrate improvement on the TUG of 10 seconds or greater with LRAD  indicating improved dynamic balance and decreased risk of falls.     Baseline  01/24/18: TUG 19.48" (was 24.03")    Time  8    Period  Weeks    Status  On-going      PT LONG TERM GOAL #3   Title  Patient will demonstrate improvement on FOTO of at least 10% indicating improved perceived functional mobility.     Baseline  01/24/18: FOTO 51.58% was 40.27%    Status  Achieved            Plan - 02/13/18 0941    Clinical Impression Statement  continued with established POC focusing on BLE strength, functional strength, and balance. Progressed him to 6" step on fwd step ups and added fwd lunging this date for strengthening. Also had him perform mini squat holds to work on the endurance of his legs; pt very challenged with this. Ended with balance activities and pt generally unsteady and limited by fatigue. He continues to demo increased knee flexion/knee buckling during balance activities without UE support. Overall pt progressing well and needs continued skilled PT to continue to address strength and balance deficits to reduce his risk for falls.     Rehab Potential  Fair    Clinical Impairments Affecting Rehab Potential  Positive: Support system; Negative: comorbidities    PT Duration  8 weeks    PT Treatment/Interventions  ADLs/Self Care Home Management;Aquatic Therapy;DME Instruction;Gait training;Stair training;Functional mobility training;Therapeutic activities;Therapeutic exercise;Balance training;Neuromuscular re-education;Patient/family education;Orthotic Fit/Training;Manual techniques;Passive range of motion;Dry needling;Energy conservation;Taping    PT Next Visit Plan  Progress to more dynamic balance and gait activities as able. continue BLE, functional strengthening and balance activities at a support surface as able. Provide patient with compression garment referral once signed.      PT Home Exercise Plan  12/28/17 - hip ext/abd/flex at counter, heel/toe raises seated; 02/02/18: Tandem  stance balance 30 seconds at countertop 1x/day    Consulted and Agree with Plan of Care  Patient;Family member/caregiver    Family Member Consulted  Patient's wife       Patient will benefit from skilled therapeutic intervention in order to improve the following deficits and impairments:  Abnormal gait, Decreased balance, Decreased endurance, Decreased mobility, Difficulty walking, Obesity, Decreased activity tolerance, Decreased strength, Postural dysfunction, Pain  Visit Diagnosis: Repeated falls  Other abnormalities of gait and mobility     Problem List Patient Active Problem List   Diagnosis Date Noted  . Bilateral leg edema 02/07/2017  . Osteoarthritis 01/28/2017  . Urge and stress incontinence 01/28/2017  . History of colonic polyps   . Colon polyp 08/07/2013  . 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 Nonspecific abnormal finding in stool contents 05/29/2013  . Atherosclerotic peripheral vascular disease with intermittent claudication ([place]) 06/14/2012  . Spinal stenosis of lumbar region 06/14/2012  . 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 PHIMOSIS 01/30/2008  . YEAST BALANITIS 10/17/2007  . Hyperlipidemia 11/23/2006  . OBESITY NOS 11/23/2006  . Depression 11/23/2006  . Essential hypertension 11/23/2006  . HEMATURIA 11/23/2006  . BENIGN PROSTATIC HYPERTROPHY 11/23/2006  . PROSTATITIS NOS 11/23/2006  . OSTEOARTHRITIS, HIP, LEFT 11/23/2006       Geraldine Solar PT, DPT  [place] [address] [place], [place], [address] Phone: [phone]   Fax:  [phone]  Name: Gabriel Love MRN: [id number] Date of Birth: 1941-03-07

## 2018-02-15 ENCOUNTER — Ambulatory Visit (HOSPITAL_COMMUNITY): Payer: Medicare Other | Admitting: Physical Therapy

## 2018-02-15 ENCOUNTER — Encounter (HOSPITAL_COMMUNITY): Payer: Self-pay | Admitting: Physical Therapy

## 2018-02-15 DIAGNOSIS — R2689 Other abnormalities of gait and mobility: Secondary | ICD-10-CM

## 2018-02-15 DIAGNOSIS — R296 Repeated falls: Secondary | ICD-10-CM

## 2018-02-15 NOTE — Therapy (Signed)
[place], [place], [address] Phone: [phone]   Fax:  [phone]   Physical Therapy Treatment / Re-assessment / Discharge Summary  Patient Details  Name: Gabriel Love MRN: [id number] Date of Birth: 1941-02-07 Referring Provider (PT): Alysia Penna, MD   Encounter Date: 02/15/2018  PT End of Session - 02/15/18 1157    Visit Number  14    Number of Visits  17    Date for PT Re-Evaluation  02/21/18   Minireassess complete 01/24/18, visit #7   Authorization Time Period  12/27/17 - 02/21/18    Authorization - Visit Number  42    Authorization - Number of Visits  17    PT Start Time  7342    PT Stop Time  1150   Session ended early for discharge   PT Time Calculation (min)  34 min    Equipment Utilized During Treatment  Gait belt    Activity Tolerance  Patient tolerated treatment well    Behavior During Therapy  [facility] for tasks assessed/performed       Past Medical History:  Diagnosis Date  . Abdominal hernia   . Allergy   . Blood transfusion without reported diagnosis 12/2006   At [facility] with hip replacement - 2 units transfused  . Cancer ([place])    skin cancers  . Cataract   . Depression   . DJD (degenerative joint disease)    shoulders bilateral  . Environmental allergies   . Full dentures   . Hearing loss    bilateral hearing aids  . History of urinary tract infection   . Hyperlipidemia    under control  . Hypertension    "borderline"  . Nonspecific abnormal finding in stool contents   . Other general symptoms(780.99)   . Prostate hypertrophy    sees Dr. Gaynelle Arabian   . Urinary leakage     Past Surgical History:  Procedure Laterality Date  . CATARACT EXTRACTION W/PHACO Right 03/25/2014   Procedure: CATARACT EXTRACTION PHACO AND INTRAOCULAR LENS PLACEMENT; CDE:9.76;  Surgeon: Williams Che, MD;  Location: [facility];  Service: Ophthalmology;  Laterality: Right;  . CATARACT EXTRACTION W/PHACO Left  06/24/2014   Procedure: CATARACT EXTRACTION PHACO AND INTRAOCULAR LENS PLACEMENT (IOC);  Surgeon: Williams Che, MD;  Location: [facility];  Service: Ophthalmology;  Laterality: Left;  CDE:8.45  . CIRCUMCISION N/A 03/26/2016   Procedure: CIRCUMCISION ADULT;  Surgeon: Carolan Clines, MD;  [facility];  Service: Urology;  Laterality: N/A;  . CIRCUMCISION, NON-NEWBORN  1985  . COLONOSCOPY  06-19-13, 03/05/15   per Dr. Deatra Ina, mass at the ileocecal valve plus several polyps, pathology benign, repeat in one year, polyp 2016   . COLONOSCOPY WITH PROPOFOL N/A 07/29/2015   Procedure: COLONOSCOPY WITH PROPOFOL;  Surgeon: Manus Gunning, MD;  [facility];  Service: Gastroenterology;  Laterality: N/A;  . JOINT REPLACEMENT  12/2006   total left hip per Dr. Percell Miller   . LAPAROSCOPIC PARTIAL COLECTOMY N/A 08/07/2013   Procedure: LAPAROSCOPIC right PARTIAL COLECTOMY;  Surgeon: Leighton Ruff, MD;  [facility];  Service: General;  Laterality: N/A;  . MULTIPLE TOOTH EXTRACTIONS    . PROSTATE SURGERY  03-29-11   had CTT per Dr. Gaynelle Arabian, in office    There were no vitals filed for this visit.  Subjective Assessment - 02/15/18 1155    Subjective  Patient denied any pain today. He stated he feels he has done well with therapy  and feels that he can continue exercises at home.     Pertinent History  Frequent falls, left total hip replacment, HTN    How long can you sit comfortably?  Not limited    How long can you stand comfortably?  15 minutes    How long can you walk comfortably?  15 minutes    Patient Stated Goals  To have better balance    Currently in Pain?  No/denies    Pain Onset  --   Has had this shoulder arthritis pain for many years        [facility] PT Assessment - 02/15/18 0001      Assessment   Medical Diagnosis  Frequent falls    Referring Provider (PT)  Alysia Penna, MD      Observation/Other Assessments   Focus on Therapeutic Outcomes (FOTO)   64% (was 40% at eval)       Strength   Right Hip Flexion  4+/5   was 4 then 4+   Right Hip Extension  3+/5   was 3+   Right Hip ABduction  4/5   was 4   Left Hip Flexion  4+/5   was 4+   Left Hip Extension  3+/5   was 3+   Left Hip ABduction  4/5   was 4   Right Knee Flexion  5/5   was 4+   Right Knee Extension  5/5   was 4+   Left Knee Flexion  5/5   was 4+   Left Knee Extension  5/5   was 4+   Right Ankle Dorsiflexion  2-/5   was 2-   Left Ankle Dorsiflexion  2-/5   was 2-      Standardized Balance Assessment   Standardized Balance Assessment  Berg Balance Test;Timed Up and Go Test      Berg Balance Test   Sit to Stand  Able to stand without using hands and stabilize independently    Standing Unsupported  Able to stand 30 seconds unsupported    Sitting with Back Unsupported but Feet Supported on Floor or Stool  Able to sit safely and securely 2 minutes    Stand to Sit  Sits safely with minimal use of hands    Transfers  Able to transfer safely, minor use of hands    Standing Unsupported with Eyes Closed  Able to stand 3 seconds    Standing Ubsupported with Feet Together  Able to place feet together independently and stand for 1 minute with supervision    From Standing, Reach Forward with Outstretched Arm  Can reach forward >5 cm safely (2")    From Standing Position, Pick up Object from Floor  Unable to try/needs assist to keep balance    From Standing Position, Turn to Look Behind Over each Shoulder  Turn sideways only but maintains balance    Turn 360 Degrees  Needs close supervision or verbal cueing    Standing Unsupported, Alternately Place Feet on Step/Stool  Able to complete >2 steps/needs minimal assist    Standing Unsupported, One Foot in [employer] balance while stepping or standing    Standing on One Leg  Tries to lift leg/unable to hold 3 seconds but remains standing independently    Total Score  30    Berg comment:  Was 20      Timed Up and Go Test   Normal TUG (seconds)   18.66    TUG Comments  With  4-wheeled walker. Minimal use of UEs to stand                           PT Education - 02/15/18 1156    Education Details  Provided patient with compression garment referral, updated HEP, and fall prevention tips.     Person(s) Educated  Patient;Spouse    Methods  Explanation;Handout    Comprehension  Verbalized understanding       PT Short Term Goals - 02/15/18 1202      PT SHORT TERM GOAL #1   Title  Patient will report understanding and report regular compliance with HEP for improved balance, strength and functional mobility.     Baseline  01/24/18:  Reports compliance with HEP at least 2-3 times a week    Status  Achieved      PT SHORT TERM GOAL #2   Title  Patient will report ability to ambulate for 10 minutes in order to perform household chores with increased ease.     Baseline  01/24/18:  Reports ability to ambulate 10-15 minutes in store with rollator    Status  Achieved      PT SHORT TERM GOAL #3   Title  Patient will demonstrate improvement on the TUG of 5 seconds or greater with LRAD indicating improved dynamic balance and decreased risk of falls.     Baseline  02/15/18: 18.66 seconds this session with rollator walker    Status  Achieved        PT Long Term Goals - 02/15/18 1202      PT LONG TERM GOAL #1   Title  Patient will demonstrate an improvement of 8 points on the Berg Balance Scale indicating improved balance and decreased risk of falling.     Baseline  02/15/18: 30/56 was 20/56 at evaluation.     Time  8    Period  Weeks    Status  Achieved      PT LONG TERM GOAL #2   Title  Patient will demonstrate improvement on the TUG of 10 seconds or greater with LRAD indicating improved dynamic balance and decreased risk of falls.     Baseline  02/15/18: See objective measures    Time  8    Period  Weeks    Status  On-going      PT LONG TERM GOAL #3   Title  Patient will demonstrate improvement on FOTO of at  least 10% indicating improved perceived functional mobility.     Baseline  01/24/18: FOTO 51.58% was 40.27%    Status  Achieved            Plan - 02/15/18 1207    Clinical Impression Statement  This session performed a re-assessment of patient's progress towards goals. Patient achieved 3 out of 3 short term goals. Patient achieved 2 out of 3 long term goals. Patient demonstrated improvements in TUG test, strength, and balance this session. Remainder of session, updated patient's HEP, obtaining compression garments and  discussed fall prevention strategies. Therapist discussed process for obtaining another referral to physical therapy if needed. Patient is being discharged from physical therapy at this time as he has met the majority of his goals and will continue with exercises at home. Discussed how to contact therapy clinic if needed in the future.     Rehab Potential  Fair    Clinical Impairments Affecting Rehab Potential  Positive: Support system; Negative: comorbidities  PT Duration  8 weeks    PT Treatment/Interventions  ADLs/Self Care Home Management;Aquatic Therapy;DME Instruction;Gait training;Stair training;Functional mobility training;Therapeutic activities;Therapeutic exercise;Balance training;Neuromuscular re-education;Patient/family education;Orthotic Fit/Training;Manual techniques;Passive range of motion;Dry needling;Energy conservation;Taping    PT Next Visit Plan  Discharged    PT Home Exercise Plan  12/28/17 - hip ext/abd/flex at counter, heel/toe raises seated; 02/02/18: Tandem stance balance 30 seconds at countertop 1x/day; 02/15/18: 10 squats at support surface 1x/day     Consulted and Agree with Plan of Care  Patient;Family member/caregiver    Family Member Consulted  Patient's wife       Patient will benefit from skilled therapeutic intervention in order to improve the following deficits and impairments:  Abnormal gait, Decreased balance, Decreased endurance, Decreased  mobility, Difficulty walking, Obesity, Decreased activity tolerance, Decreased strength, Postural dysfunction, Pain  Visit Diagnosis: Repeated falls  Other abnormalities of gait and mobility     Problem List Patient Active Problem List   Diagnosis Date Noted  . Bilateral leg edema 02/07/2017  . Osteoarthritis 01/28/2017  . Urge and stress incontinence 01/28/2017  . History of colonic polyps   . Colon polyp 08/07/2013  . 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 Nonspecific abnormal finding in stool contents 05/29/2013  . Atherosclerotic peripheral vascular disease with intermittent claudication ([place]) 06/14/2012  . Spinal stenosis of lumbar region 06/14/2012  . 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 PHIMOSIS 01/30/2008  . YEAST BALANITIS 10/17/2007  . Hyperlipidemia 11/23/2006  . OBESITY NOS 11/23/2006  . Depression 11/23/2006  . Essential hypertension 11/23/2006  . HEMATURIA 11/23/2006  . BENIGN PROSTATIC HYPERTROPHY 11/23/2006  . PROSTATITIS NOS 11/23/2006  . OSTEOARTHRITIS, HIP, LEFT 11/23/2006   PHYSICAL THERAPY DISCHARGE SUMMARY  Visits from Start of Care: 14  Current functional level related to goals / functional outcomes: See above   Remaining deficits: See above   Education / Equipment: HEP, compression garments, fall prevention tips, see above Plan: Patient agrees to discharge.  Patient goals were partially met. Patient is being discharged due to                                                     ?????    Meeting the majority of his rehab goals and being pleased with his current functional status.              Clarene Critchley PT, DPT 12:10 PM, 02/15/18 [place], [place], [address] Phone: [phone]   Fax:  [phone]  Name: Gabriel Love MRN: [id number] Date of Birth:  July 01, 1940

## 2018-02-15 NOTE — Patient Instructions (Signed)
FUNCTIONAL MOBILITY: Squat With UE Support    Stand by chair or table. Stance: shoulder-width on floor. Bend hips and knees. Keep back straight. Do not allow knees to bend past toes. Squeeze glutes and quads to stand. Place a chair behind you for safety.  __10_ reps per set, _1__ sets per day, __7_ days per week  Copyright  VHI. All rights reserved.

## 2018-02-19 ENCOUNTER — Other Ambulatory Visit: Payer: Self-pay | Admitting: Family Medicine

## 2018-03-22 ENCOUNTER — Other Ambulatory Visit: Payer: Self-pay | Admitting: Family Medicine

## 2018-04-03 ENCOUNTER — Ambulatory Visit: Payer: Self-pay

## 2018-04-03 NOTE — Telephone Encounter (Signed)
Wife called to report intermittent shortness of breath with activity over past 2 weeks.  Pt. is presently with his wife, and gave permission to speak with her.  Wife reported the pt. described his shortness of breath as being "mild", and occurs with activity.  Stated he hears an intermittent wheeze.  Reported he has "a little congested cough"; bringing up green mucus.  Denied fever/ chills.  Denied chest tightness or pain.  Denied any nasal drainage.  Appt. Sched. In AM with PCP, since sx's have been going on x 2 weeks.  Pt. Knows to call back if symptoms worsen.  Agrees with plan.     Reason for Disposition . [1] MILD difficulty breathing (e.g., minimal/no SOB at rest, SOB with walking, pulse <100) AND [2] NEW-onset or WORSE than normal    C/o intermittently shortness of breath with activity, over past 2 weeks.  Answer Assessment - Initial Assessment Questions 1. RESPIRATORY STATUS: "Describe your breathing?" (e.g., wheezing, shortness of breath, unable to speak, severe coughing)     Feels a little short now; some intermittent wheezing 2. ONSET: "When did this breathing problem begin?"      A couple of weeks 3. PATTERN "Does the difficult breathing come and go, or has it been constant since it started?"      Comes and goes 4. SEVERITY: "How bad is your breathing?" (e.g., mild, moderate, severe)    - MILD: No SOB at rest, mild SOB with walking, speaks normally in sentences, can lay down, no retractions, pulse < 100.    - MODERATE: SOB at rest, SOB with minimal exertion and prefers to sit, cannot lie down flat, speaks in phrases, mild retractions, audible wheezing, pulse 100-120.    - SEVERE: Very SOB at rest, speaks in single words, struggling to breathe, sitting hunched forward, retractions, pulse > 120      mild 5. RECURRENT SYMPTOM: "Have you had difficulty breathing before?" If so, ask: "When was the last time?" and "What happened that time?"      Has had this before 6. CARDIAC HISTORY: "Do  you have any history of heart disease?" (e.g., heart attack, angina, bypass surgery, angioplasty)      Denied  7. LUNG HISTORY: "Do you have any history of lung disease?"  (e.g., pulmonary embolus, asthma, emphysema)     denied 8. CAUSE: "What do you think is causing the breathing problem?"      unknown 9. OTHER SYMPTOMS: "Do you have any other symptoms? (e.g., dizziness, runny nose, cough, chest pain, fever)     A little congested cough; coughing up green mucus;  sometimes feels chest congestion; no fever/ chills ; denied runny nose  10. PREGNANCY: "Is there any chance you are pregnant?" "When was your last menstrual period?"       N/a  11. TRAVEL: "Have you traveled out of the country in the last month?" (e.g., travel history, exposures)       Denied  Protocols used: BREATHING DIFFICULTY-A-AH

## 2018-04-04 ENCOUNTER — Ambulatory Visit: Payer: Medicare Other | Admitting: Family Medicine

## 2018-04-04 ENCOUNTER — Encounter: Payer: Self-pay | Admitting: Family Medicine

## 2018-04-04 VITALS — BP 132/80 | HR 67 | Temp 98.4°F | Wt 342.0 lb

## 2018-04-04 DIAGNOSIS — I10 Essential (primary) hypertension: Secondary | ICD-10-CM

## 2018-04-04 DIAGNOSIS — R0602 Shortness of breath: Secondary | ICD-10-CM

## 2018-04-04 DIAGNOSIS — R600 Localized edema: Secondary | ICD-10-CM

## 2018-04-04 LAB — CBC WITH DIFFERENTIAL/PLATELET
BASOS ABS: 0.1 10*3/uL (ref 0.0–0.1)
BASOS PCT: 0.6 % (ref 0.0–3.0)
Eosinophils Absolute: 0.1 10*3/uL (ref 0.0–0.7)
Eosinophils Relative: 1.5 % (ref 0.0–5.0)
HEMATOCRIT: 47 % (ref 39.0–52.0)
HEMOGLOBIN: 15.6 g/dL (ref 13.0–17.0)
LYMPHS PCT: 19.3 % (ref 12.0–46.0)
Lymphs Abs: 1.8 10*3/uL (ref 0.7–4.0)
MCHC: 33.3 g/dL (ref 30.0–36.0)
MCV: 88.2 fl (ref 78.0–100.0)
MONOS PCT: 8.1 % (ref 3.0–12.0)
Monocytes Absolute: 0.8 10*3/uL (ref 0.1–1.0)
Neutro Abs: 6.6 10*3/uL (ref 1.4–7.7)
Neutrophils Relative %: 70.5 % (ref 43.0–77.0)
PLATELETS: 195 10*3/uL (ref 150.0–400.0)
RBC: 5.33 Mil/uL (ref 4.22–5.81)
RDW: 14.6 % (ref 11.5–15.5)
WBC: 9.4 10*3/uL (ref 4.0–10.5)

## 2018-04-04 LAB — BASIC METABOLIC PANEL
BUN: 20 mg/dL (ref 6–23)
CO2: 33 meq/L — AB (ref 19–32)
Calcium: 8.8 mg/dL (ref 8.4–10.5)
Chloride: 101 mEq/L (ref 96–112)
Creatinine, Ser: 0.84 mg/dL (ref 0.40–1.50)
GFR: 93.76 mL/min (ref >60.00)
Glucose, Bld: 98 mg/dL (ref 70–99)
POTASSIUM: 3.7 meq/L (ref 3.5–5.1)
SODIUM: 141 meq/L (ref 135–145)

## 2018-04-04 LAB — BRAIN NATRIURETIC PEPTIDE: Pro B Natriuretic peptide (BNP): 103 pg/mL — ABNORMAL HIGH (ref 0.0–100.0)

## 2018-04-04 LAB — TSH: TSH: 2.68 u[IU]/mL (ref 0.35–4.50)

## 2018-04-04 MED ORDER — FUROSEMIDE 40 MG PO TABS: 40.0000 mg | ORAL_TABLET | Freq: Two times a day (BID) | ORAL | 3 refills | Status: DC

## 2018-04-04 NOTE — Progress Notes (Signed)
   Subjective:    Patient ID: Gabriel Love, male    DOB: 01-Mar-1941, 78 y.o.   MRN: [id number]  HPI Here for 2 weeks of increased SOB, especially when bending over. No chest pain. His weight has not changed in the past 3 months. He always has some ankle swelling. He takes Lasix 40 mg once a day. Of note in November 2018 he had a CXR which was clear and an ECHO showing an EF of 60-65%.   Review of Systems  Constitutional: Negative.   Respiratory: Positive for shortness of breath. Negative for cough and wheezing.   Cardiovascular: Positive for leg swelling. Negative for chest pain and palpitations.  Neurological: Negative.        Objective:   Physical Exam Constitutional:      Comments: Morbidly obese, walks with a walker   Cardiovascular:     Rate and Rhythm: Normal rate and regular rhythm.     Pulses: Normal pulses.     Heart sounds: Normal heart sounds.  Pulmonary:     Effort: Pulmonary effort is normal.     Breath sounds: No wheezing.     Comments: Trace rales at both bases  Musculoskeletal:     Comments: 1+ edema in both ankles   Neurological:     General: No focal deficit present.     Mental Status: He is alert and oriented to person, place, and time.           Assessment & Plan:  SOB, possibly with mild fluid overload. We wil increase the Lasix to 40 mg bid. Get labs including a BNP.  Alysia Penna, MD

## 2018-05-06 ENCOUNTER — Other Ambulatory Visit: Payer: Self-pay | Admitting: Family Medicine

## 2018-05-11 ENCOUNTER — Encounter: Payer: Self-pay | Admitting: Podiatry

## 2018-05-11 ENCOUNTER — Ambulatory Visit: Payer: Medicare Other | Admitting: Podiatry

## 2018-05-11 DIAGNOSIS — I739 Peripheral vascular disease, unspecified: Secondary | ICD-10-CM

## 2018-05-11 DIAGNOSIS — M79675 Pain in left toe(s): Secondary | ICD-10-CM | POA: Diagnosis not present

## 2018-05-11 DIAGNOSIS — B351 Tinea unguium: Secondary | ICD-10-CM

## 2018-05-11 DIAGNOSIS — M79674 Pain in right toe(s): Secondary | ICD-10-CM | POA: Diagnosis not present

## 2018-05-11 NOTE — Patient Instructions (Signed)

## 2018-05-11 NOTE — Progress Notes (Signed)
Subjective: Gabriel Love is a 78 y.o. y.o. male who presents for preventative foot care today with history of PAD with claudication.  He is seen for painful, discolored, thick toenails and painful which interfere with daily activities. Pain is aggravated when wearing enclosed shoe gear. Pain is relieved with periodic professional debridement.  His wife is present during the visit today.  He nor his wife voiced any new pedal concerns on today's visit.  Laurey Morale, MD is his PCP.  Last visit April 04, 2018.   Current Outpatient Medications:  .  albuterol (PROVENTIL HFA;VENTOLIN HFA) 108 (90 Base) MCG/ACT inhaler, Inhale 2 puffs every 4 (four) hours as needed into the lungs for wheezing or shortness of breath., Disp: 1 Inhaler, Rfl: 11 .  aspirin EC 81 MG tablet, Take 81 mg by mouth daily., Disp: , Rfl:  .  atorvastatin (LIPITOR) 40 MG tablet, TAKE 1 TABLET BY MOUTH ONCE DAILY, Disp: 90 tablet, Rfl: 0 .  clotrimazole-betamethasone (LOTRISONE) cream, Apply 1 application topically daily. Applies to affected area., Disp: , Rfl: 0 .  furosemide (LASIX) 40 MG tablet, Take 1 tablet (40 mg total) by mouth 2 (two) times daily., Disp: 180 tablet, Rfl: 3 .  LEXAPRO 20 MG tablet, Take 1 tablet (20 mg total) by mouth daily., Disp: 30 tablet, Rfl: 11 .  metoprolol succinate (TOPROL-XL) 50 MG 24 hr tablet, TAKE 1 TABLET BY MOUTH ONCE DAILY. TAKE WITH OR IMMEDIATELY FOLLOWING A MEAL., Disp: 30 tablet, Rfl: 3 .  traMADol-acetaminophen (ULTRACET) 37.5-325 MG tablet, Take 1 tablet by mouth every 6 (six) hours as needed., Disp: 30 tablet, Rfl: 0  Allergies  Allergen Reactions  . Ivp Dye [Iodinated Diagnostic Agents] Anaphylaxis    Iv dye allergy in 1985, anaphylaxix//a.calhoun- "shocked back to life"  . Lisinopril Cough  . Penicillins Other (See Comments)    Urticaria Has patient had a PCN reaction causing immediate rash, facial/tongue/throat swelling, SOB or lightheadedness with hypotension: no Has  patient had a PCN reaction causing severe rash involving mucus membranes or skin necrosis: no Has patient had a PCN reaction that required hospitalization no Has patient had a PCN reaction occurring within the last 10 years: yes If all of the above answers are "NO", then may proceed with Cephalosporin use.     Objective: Vascular Examination: Capillary refill time <3 seconds x 10 digits  Dorsalis pedis pulses 2/4 b/l  Posterior tibial pulses absent secondary to lower extremity edema b/l  No digital hair x 10 digits  Skin temperature within normal limits bilaterally  +2 pitting edema noted bilateral lower extremities with dependent edema.  No open wounds, no blistering, no pain on palpation.   Dermatological Examination: Skin atrophic with mild varicosities noted bilaterally.  No open wounds.  No interdigital macerations noted.  Toenails 1-5 b/l discolored, thick, dystrophic with subungual debris and pain with palpation to nailbeds due to thickness of nails.   Musculoskeletal: Muscle strength 5/5 to all LE muscle groups.  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  No edema, no hyperkeratosis, no impending wound, no flocculence noted.  Uses Rollator for mobility assistance.  Neurological: Sensation intact with 10 gram monofilament.  Vibratory sensation intact.   Assessment: 1. Painful onychomycosis toenails 1-5 b/l 2. Lower extremity edema bilaterally 3. PAD with claudication  Plan: 1. Discuss diabetic foot care principles. Literature dispensed. 2. Toenails 1-5 b/l were debrided in length and girth without iatrogenic bleeding. 3. Continue compression hose for  lower extremity edema daily. 4. Patient to continue soft, supportive shoe gear 5. Patient to report any pedal injuries to medical professional  6. Follow up 3 months.  7. Patient/POA to call should there be a concern in the interim.

## 2018-05-15 ENCOUNTER — Other Ambulatory Visit: Payer: Self-pay | Admitting: Family Medicine

## 2018-05-24 ENCOUNTER — Other Ambulatory Visit: Payer: Self-pay | Admitting: Family Medicine

## 2018-07-03 ENCOUNTER — Other Ambulatory Visit: Payer: Self-pay | Admitting: Family Medicine

## 2018-08-10 ENCOUNTER — Ambulatory Visit: Payer: Medicare Other | Admitting: Podiatry

## 2018-08-14 ENCOUNTER — Encounter: Payer: Self-pay | Admitting: Gastroenterology

## 2018-08-20 ENCOUNTER — Other Ambulatory Visit: Payer: Self-pay | Admitting: Family Medicine

## 2018-09-03 ENCOUNTER — Other Ambulatory Visit: Payer: Self-pay | Admitting: Family Medicine

## 2018-09-18 ENCOUNTER — Encounter: Payer: Self-pay | Admitting: Podiatry

## 2018-09-18 ENCOUNTER — Ambulatory Visit (INDEPENDENT_AMBULATORY_CARE_PROVIDER_SITE_OTHER): Payer: Medicare Other | Admitting: Podiatry

## 2018-09-18 ENCOUNTER — Other Ambulatory Visit: Payer: Self-pay

## 2018-09-18 VITALS — Temp 98.2°F

## 2018-09-18 DIAGNOSIS — B351 Tinea unguium: Secondary | ICD-10-CM | POA: Diagnosis not present

## 2018-09-18 DIAGNOSIS — M79674 Pain in right toe(s): Secondary | ICD-10-CM | POA: Diagnosis not present

## 2018-09-18 DIAGNOSIS — M79675 Pain in left toe(s): Secondary | ICD-10-CM

## 2018-09-18 NOTE — Patient Instructions (Signed)

## 2018-09-23 NOTE — Progress Notes (Signed)
Subjective:  Gabriel Love presents to clinic today with cc of  painful, thick, discolored, elongated toenails 1-5 b/l that become tender and cannot cut because of thickness. Pain is aggravated when wearing enclosed shoe gear.   Current Outpatient Medications:  .  albuterol (PROVENTIL HFA;VENTOLIN HFA) 108 (90 Base) MCG/ACT inhaler, INHALE 2 PUFFS BY MOUTH EVERY 4 HOURS, Disp: 8.5 g, Rfl: 6 .  aspirin EC 81 MG tablet, Take 81 mg by mouth daily., Disp: , Rfl:  .  atorvastatin (LIPITOR) 40 MG tablet, TAKE 1 TABLET BY MOUTH EVERY DAY, Disp: 90 tablet, Rfl: 0 .  clotrimazole-betamethasone (LOTRISONE) cream, Apply 1 application topically daily. Applies to affected area., Disp: , Rfl: 0 .  furosemide (LASIX) 40 MG tablet, Take 1 tablet (40 mg total) by mouth 2 (two) times daily., Disp: 180 tablet, Rfl: 3 .  LEXAPRO 20 MG tablet, Take 1 tablet (20 mg total) by mouth daily., Disp: 30 tablet, Rfl: 11 .  metoprolol succinate (TOPROL-XL) 50 MG 24 hr tablet, TAKE 1 TABLET BY MOUTH ONCE DAILY WITH OR IMMEDIATELY FOLLOWING A MEAL, Disp: 90 tablet, Rfl: 1 .  MYRBETRIQ 50 MG TB24 tablet, TK 1 T PO QD, Disp: , Rfl:  .  solifenacin (VESICARE) 5 MG tablet, TK 1 T PO QD, Disp: , Rfl:  .  traMADol-acetaminophen (ULTRACET) 37.5-325 MG tablet, Take 1 tablet by mouth every 6 (six) hours as needed., Disp: 30 tablet, Rfl: 0   Allergies  Allergen Reactions  . Ivp Dye [Iodinated Diagnostic Agents] Anaphylaxis    Iv dye allergy in 1985, anaphylaxix//a.calhoun- "shocked back to life"  . Lisinopril Cough  . Penicillins Other (See Comments)    Urticaria Has patient had a PCN reaction causing immediate rash, facial/tongue/throat swelling, SOB or lightheadedness with hypotension: no Has patient had a PCN reaction causing severe rash involving mucus membranes or skin necrosis: no Has patient had a PCN reaction that required hospitalization no Has patient had a PCN reaction occurring within the last 10 years: yes If all of  the above answers are "NO", then may proceed with Cephalosporin use.      Objective: Vitals:   09/18/18 1104  Temp: 98.2 F (36.8 C)    Physical Examination:  Vascular Examination: Capillary refill time <3 seconds x 10 digits.  Palpable DP pulses b/l.  PT pulses nonpalpable b/l.  Digital hair absent b/l.  +2 pitting BLE. No open wounds. No blistering. No pain on palpation.  Skin temperature gradient WNL b/l.  Dermatological Examination: Skin atrophic with mild varicosities b/l.  No open wounds b/l.  No interdigital macerations noted b/l.  Elongated, thick, discolored brittle toenails with subungual debris and pain on dorsal palpation of nailbeds 1-5 b/l.  Musculoskeletal Examination: Muscle strength 5/5 to all muscle groups b/l.  Claw toe right great toe.  Uses rollator for mobility assistance.  No pain, crepitus or joint discomfort with active/passive ROM.  Neurological Examination: Sensation intact 5/5 b/l with 10 gram monofilament.  Vibratory sensation intact b/l.  Assessment: Mycotic nail infection with pain 1-5 b/l  Plan: 1. Toenails 1-5 b/l were debrided in length and girth without iatrogenic laceration. 2. Continue compression hose b/l LE daily for edema management. 2.  Continue soft, supportive shoe gear daily. 3.  Report any pedal injuries to medical professional. 4.  Follow up 3 months. 5.  Patient/POA to call should there be a question/concern in there interim.

## 2018-11-25 ENCOUNTER — Other Ambulatory Visit: Payer: Self-pay | Admitting: Family Medicine

## 2018-12-20 ENCOUNTER — Telehealth (INDEPENDENT_AMBULATORY_CARE_PROVIDER_SITE_OTHER): Payer: Medicare Other | Admitting: Family Medicine

## 2018-12-20 ENCOUNTER — Encounter: Payer: Self-pay | Admitting: Family Medicine

## 2018-12-20 ENCOUNTER — Encounter: Payer: Self-pay | Admitting: Podiatry

## 2018-12-20 ENCOUNTER — Other Ambulatory Visit: Payer: Self-pay

## 2018-12-20 ENCOUNTER — Ambulatory Visit (INDEPENDENT_AMBULATORY_CARE_PROVIDER_SITE_OTHER): Payer: Medicare Other | Admitting: Podiatry

## 2018-12-20 DIAGNOSIS — L0292 Furuncle, unspecified: Secondary | ICD-10-CM | POA: Diagnosis not present

## 2018-12-20 DIAGNOSIS — B351 Tinea unguium: Secondary | ICD-10-CM

## 2018-12-20 DIAGNOSIS — M79675 Pain in left toe(s): Secondary | ICD-10-CM

## 2018-12-20 DIAGNOSIS — M79674 Pain in right toe(s): Secondary | ICD-10-CM

## 2018-12-20 MED ORDER — MUPIROCIN 2 % EX OINT: TOPICAL_OINTMENT | Freq: Two times a day (BID) | CUTANEOUS | 1 refills | Status: DC

## 2018-12-20 NOTE — Progress Notes (Signed)
Virtual Visit via Telephone Note  I connected with the patient on 12/20/18 at 11:00 AM EDT by telephone and verified that I am speaking with the correct person using two identifiers. We attempted to connect virtually but we had technical difficulties with the audio and video.    I discussed the limitations, risks, security and privacy concerns of performing an evaluation and management service by telephone and the availability of in person appointments. I also discussed with the patient that there may be a patient responsible charge related to this service. The patient expressed understanding and agreed to proceed.  Location patient: home Location provider: work or home office Participants present for the call: patient, provider Patient did not have a visit in the prior 7 days to address this/these issue(s).   History of Present Illness: Here for one week of 2 sores on the skin, one on the neck and another on the back of the left leg. They drained fluid a few days but not now. He has been applying Neosporin with no effect. He feels fine in general, no fever.    Observations/Objective: Patient sounds cheerful and well on the phone. I do not appreciate any SOB. Speech and thought processing are grossly intact. Patient reported vitals:  Assessment and Plan: Possible boils, he will use Mupiricin ointment bid. Recheck prn.  Alysia Penna, MD   Follow Up Instructions:     [phone] 5-10 [phone] 11-20 [phone] 21-30 I did not refer this patient for an OV in the next 24 hours for this/these issue(s).  I discussed the assessment and treatment plan with the patient. The patient was provided an opportunity to ask questions and all were answered. The patient agreed with the plan and demonstrated an understanding of the instructions.   The patient was advised to call back or seek an in-person evaluation if the symptoms worsen or if the condition fails to improve as anticipated.  I provided 10 minutes  of non-face-to-face time during this encounter.   Alysia Penna, MD

## 2018-12-20 NOTE — Patient Instructions (Signed)

## 2018-12-21 NOTE — Progress Notes (Signed)
Subjective: Gabriel Love is seen today for preventative diabetic foot care follow up with cc painful, elongated, thickened toenails 1-5 b/l feet that he cannot cut. Pain interferes with daily activities. Aggravating factor includes wearing enclosed shoe gear and relieved with periodic debridement.  Current Outpatient Medications on File Prior to Visit  Medication Sig  . albuterol (PROVENTIL HFA;VENTOLIN HFA) 108 (90 Base) MCG/ACT inhaler INHALE 2 PUFFS BY MOUTH EVERY 4 HOURS  . aspirin EC 81 MG tablet Take 81 mg by mouth daily.  Marland Kitchen atorvastatin (LIPITOR) 40 MG tablet TAKE 1 TABLET BY MOUTH EVERY DAY  . clotrimazole-betamethasone (LOTRISONE) cream Apply 1 application topically daily. Applies to affected area.  . furosemide (LASIX) 40 MG tablet Take 1 tablet (40 mg total) by mouth 2 (two) times daily.  Marland Kitchen LEXAPRO 20 MG tablet Take 1 tablet (20 mg total) by mouth daily.  . metoprolol succinate (TOPROL-XL) 50 MG 24 hr tablet TAKE 1 TABLET BY MOUTH ONCE DAILY WITH OR IMMEDIATELY FOLLOWING A MEAL  . MYRBETRIQ 50 MG TB24 tablet TK 1 T PO QD  . solifenacin (VESICARE) 5 MG tablet TK 1 T PO QD  . traMADol-acetaminophen (ULTRACET) 37.5-325 MG tablet Take 1 tablet by mouth every 6 (six) hours as needed.   No current facility-administered medications on file prior to visit.      Allergies  Allergen Reactions  . Ivp Dye [Iodinated Diagnostic Agents] Anaphylaxis    Iv dye allergy in 1985, anaphylaxix//a.calhoun- "shocked back to life"  . Lisinopril Cough  . Penicillins Other (See Comments)    Urticaria Has patient had a PCN reaction causing immediate rash, facial/tongue/throat swelling, SOB or lightheadedness with hypotension: no Has patient had a PCN reaction causing severe rash involving mucus membranes or skin necrosis: no Has patient had a PCN reaction that required hospitalization no Has patient had a PCN reaction occurring within the last 10 years: yes If all of the above answers are "NO", then  may proceed with Cephalosporin use.    Objective:  Vascular Examination: Capillary refill time <3 seconds x 10 digits.  Dorsalis pedis present b/l.  Posterior tibial pulses nonpalpable b/l.  Digital hair absent b/l.  Skin temperature gradient WNL b/l.   Edema BLE 2+. No open wounds. No POP. No increased warmth. No pain with calf compression.   Dermatological Examination: Skin atrophic with mild varicosities b/l.  Toenails 1-5 b/l discolored, thick, dystrophic with subungual debris and pain with palpation to nailbeds due to thickness of nails.  Musculoskeletal: Muscle strength 5/5 to all LE muscle groups  Claw toe right great toe.   Uses rollator for mobility assistance.  No pain, crepitus or joint limitation noted with ROM.   Neurological Examination: Protective sensation intact 5/5 b/l with 10 gram monofilament bilaterally.  Vibratory sensation intact bilaterally.   Assessment: Painful onychomycosis toenails 1-5 b/l   Plan: 1. Toenails 1-5 b/l were debrided in length and girth without iatrogenic bleeding. 2. Continue compression hose b/l LE.  3. Patient to continue soft, supportive shoe gear. 4. Patient to report any pedal injuries to medical professional immediately. 5. Follow up 3 months.  6. Patient/POA to call should there be a concern in the interim.

## 2019-01-03 ENCOUNTER — Other Ambulatory Visit: Payer: Self-pay | Admitting: Family Medicine

## 2019-02-19 ENCOUNTER — Ambulatory Visit: Payer: Medicare Other | Admitting: Family Medicine

## 2019-02-19 ENCOUNTER — Other Ambulatory Visit: Payer: Self-pay

## 2019-02-19 ENCOUNTER — Encounter: Payer: Self-pay | Admitting: Family Medicine

## 2019-02-19 VITALS — BP 180/90 | HR 68 | Temp 98.2°F | Ht 67.0 in | Wt 347.0 lb

## 2019-02-19 DIAGNOSIS — N401 Enlarged prostate with lower urinary tract symptoms: Secondary | ICD-10-CM

## 2019-02-19 DIAGNOSIS — I10 Essential (primary) hypertension: Secondary | ICD-10-CM | POA: Diagnosis not present

## 2019-02-19 DIAGNOSIS — Z23 Encounter for immunization: Secondary | ICD-10-CM

## 2019-02-19 DIAGNOSIS — E785 Hyperlipidemia, unspecified: Secondary | ICD-10-CM

## 2019-02-19 DIAGNOSIS — R739 Hyperglycemia, unspecified: Secondary | ICD-10-CM

## 2019-02-19 DIAGNOSIS — N138 Other obstructive and reflux uropathy: Secondary | ICD-10-CM

## 2019-02-19 DIAGNOSIS — F331 Major depressive disorder, recurrent, moderate: Secondary | ICD-10-CM

## 2019-02-19 DIAGNOSIS — R600 Localized edema: Secondary | ICD-10-CM

## 2019-02-19 LAB — LIPID PANEL
Cholesterol: 139 mg/dL (ref 0–200)
HDL: 36.1 mg/dL — ABNORMAL LOW (ref >39.00)
LDL Cholesterol: 80 mg/dL (ref 0–99)
NonHDL: 102.81
Total CHOL/HDL Ratio: 4
Triglycerides: 115 mg/dL (ref 0.0–149.0)
VLDL: 23 mg/dL (ref 0.0–40.0)

## 2019-02-19 LAB — CBC WITH DIFFERENTIAL/PLATELET
Basophils Absolute: 0 10*3/uL (ref 0.0–0.1)
Basophils Relative: 0.3 % (ref 0.0–3.0)
Eosinophils Absolute: 0.2 10*3/uL (ref 0.0–0.7)
Eosinophils Relative: 2.3 % (ref 0.0–5.0)
HCT: 46.3 % (ref 39.0–52.0)
Hemoglobin: 15.2 g/dL (ref 13.0–17.0)
Lymphocytes Relative: 19.7 % (ref 12.0–46.0)
Lymphs Abs: 2 10*3/uL (ref 0.7–4.0)
MCHC: 33 g/dL (ref 30.0–36.0)
MCV: 89.3 fl (ref 78.0–100.0)
Monocytes Absolute: 0.7 10*3/uL (ref 0.1–1.0)
Monocytes Relative: 6.8 % (ref 3.0–12.0)
Neutro Abs: 7.4 10*3/uL (ref 1.4–7.7)
Neutrophils Relative %: 70.9 % (ref 43.0–77.0)
Platelets: 199 10*3/uL (ref 150.0–400.0)
RBC: 5.18 Mil/uL (ref 4.22–5.81)
RDW: 14.4 % (ref 11.5–15.5)
WBC: 10.4 10*3/uL (ref 4.0–10.5)

## 2019-02-19 LAB — BASIC METABOLIC PANEL
BUN: 17 mg/dL (ref 6–23)
CO2: 33 mEq/L — ABNORMAL HIGH (ref 19–32)
Calcium: 8.7 mg/dL (ref 8.4–10.5)
Chloride: 100 mEq/L (ref 96–112)
Creatinine, Ser: 0.81 mg/dL (ref 0.40–1.50)
GFR: 91.79 mL/min (ref >60.00)
Glucose, Bld: 96 mg/dL (ref 70–99)
Potassium: 3.9 mEq/L (ref 3.5–5.1)
Sodium: 141 mEq/L (ref 135–145)

## 2019-02-19 LAB — HEPATIC FUNCTION PANEL
ALT: 15 U/L (ref 0–53)
AST: 21 U/L (ref 0–37)
Albumin: 3.7 g/dL (ref 3.5–5.2)
Alkaline Phosphatase: 82 U/L (ref 39–117)
Bilirubin, Direct: 0.1 mg/dL (ref 0.0–0.3)
Total Bilirubin: 0.9 mg/dL (ref 0.2–1.2)
Total Protein: 6.7 g/dL (ref 6.0–8.3)

## 2019-02-19 LAB — HEMOGLOBIN A1C: Hgb A1c MFr Bld: 5.5 % (ref 4.6–6.5)

## 2019-02-19 LAB — TSH: TSH: 3 u[IU]/mL (ref 0.35–4.50)

## 2019-02-19 MED ORDER — ATORVASTATIN CALCIUM 40 MG PO TABS: 40.0000 mg | ORAL_TABLET | Freq: Every day | ORAL | 3 refills | Status: DC

## 2019-02-19 MED ORDER — METOPROLOL SUCCINATE ER 50 MG PO TB24: ORAL_TABLET | ORAL | 3 refills | Status: DC

## 2019-02-19 NOTE — Progress Notes (Signed)
   Subjective:    Patient ID: Gabriel Love, male    DOB: [birth year], 78 y.o.   MRN: [id number]  HPI Here to follow up on several issues. He feels well in general. His weight is stable. His BP at home has been stable, but I think it is elevated this morning because he has not taken any of his medications yet and because he used his albuterol inhaler just before he got to out clinic today. His leg swelling is stable.    Review of Systems  Constitutional: Negative.   Respiratory: Positive for shortness of breath and wheezing.   Cardiovascular: Positive for leg swelling. Negative for chest pain and palpitations.  Gastrointestinal: Negative.   Neurological: Negative.        Objective:   Physical Exam Constitutional:      Appearance: He is obese.  Cardiovascular:     Rate and Rhythm: Normal rate and regular rhythm.     Pulses: Normal pulses.     Heart sounds: Normal heart sounds.  Pulmonary:     Effort: Pulmonary effort is normal.     Breath sounds: Normal breath sounds.  Abdominal:     General: Abdomen is flat. Bowel sounds are normal. There is no distension.     Palpations: Abdomen is soft. There is no mass.     Tenderness: There is no abdominal tenderness. There is no guarding or rebound.     Hernia: No hernia is present.  Musculoskeletal:     Comments: 3+ edema in both lower legs   Neurological:     General: No focal deficit present.     Mental Status: He is alert and oriented to person, place, and time.           Assessment & Plan:  His obesity and HTN and leg edema are stable. Get fasting labs today to check lipids, A1c, etc. Given a flu shot.  Alysia Penna, MD

## 2019-02-19 NOTE — Patient Instructions (Signed)
Health Maintenance Due  Topic Date Due  . COLONOSCOPY  07/28/2016  . TETANUS/TDAP  01/11/2018  . INFLUENZA VACCINE  10/21/2018    Depression screen [facility] 2/9 12/20/2017 03/02/2016 02/28/2015  Decreased Interest 0 0 0  Down, Depressed, Hopeless 0 0 0  PHQ - 2 Score 0 0 0

## 2019-03-27 ENCOUNTER — Ambulatory Visit: Payer: Medicare Other | Admitting: Podiatry

## 2019-03-27 ENCOUNTER — Encounter: Payer: Self-pay | Admitting: Podiatry

## 2019-03-27 ENCOUNTER — Other Ambulatory Visit: Payer: Self-pay

## 2019-03-27 DIAGNOSIS — M79674 Pain in right toe(s): Secondary | ICD-10-CM

## 2019-03-27 DIAGNOSIS — M79675 Pain in left toe(s): Secondary | ICD-10-CM | POA: Diagnosis not present

## 2019-03-27 DIAGNOSIS — B351 Tinea unguium: Secondary | ICD-10-CM | POA: Diagnosis not present

## 2019-03-27 DIAGNOSIS — I739 Peripheral vascular disease, unspecified: Secondary | ICD-10-CM | POA: Diagnosis not present

## 2019-03-27 NOTE — Patient Instructions (Signed)

## 2019-03-29 NOTE — Progress Notes (Signed)
Subjective: Gabriel Love presents today for preventative foot care. He has h/o PAD. Patient is seen for follow up of painful, mycotic toenails which interfere with comfortable ambulation when wearing enclosed shoe gear. Pain is relieved with periodic professional debridement.  He voices no new pedal problems on today's visit.  Medications reviewed in chart.  Allergies  Allergen Reactions  . Ivp Dye [Iodinated Diagnostic Agents] Anaphylaxis    Iv dye allergy in 1985, anaphylaxix//a.calhoun- "shocked back to life"  . Lisinopril Cough  . Penicillins Other (See Comments)    Urticaria Has patient had a PCN reaction causing immediate rash, facial/tongue/throat swelling, SOB or lightheadedness with hypotension: no Has patient had a PCN reaction causing severe rash involving mucus membranes or skin necrosis: no Has patient had a PCN reaction that required hospitalization no Has patient had a PCN reaction occurring within the last 10 years: yes If all of the above answers are "NO", then may proceed with Cephalosporin use.     Objective: There were no vitals filed for this visit.  Vascular Examination: Capillary refill time to digits <3 seconds b/l.  Dorsalis pedis present b/l.  Posterior tibial pulses absent b/l.  Digital hair absent b/l.  Skin temperature gradient WNL b/l.   Bilateral LE edema with no open wounds b/l.   Dermatological Examination: Skin atrophy with mild varicosities b/l LE.  No open wounds b/l.  No interdigital macerations b/l.  Toenails 1-5 b/l discolored, thick, dystrophic with subungual debris and pain with palpation to nailbeds due to thickness of nails.  Musculoskeletal: Muscle strength 5/5 b/l to all LE muscle groups.  Gross bony deformities:  Clawtoe right great toe.  Uses rollator for mobility assistance.  No pain, crepitus or joint limitation with passive/active ROM b/l.  Neurological Examination: Protective sensation intact 5/5 b/l with 10  gram monofilament.  Vibratory sensation intact bilaterally.   Assessment: 1. Painful onychomycosis toenails 1-5 b/l 2. PAD  Plan: 1. Toenails 1-5 b/l were debrided in length and girth without iatrogenic bleeding. 2. Patient to continue soft, supportive shoe gear. 3. Patient to report any pedal injuries to medical professional. 4. Follow up 3 months.  5. Patient/POA to call should there be a concern in the interim.

## 2019-04-14 ENCOUNTER — Other Ambulatory Visit: Payer: Self-pay | Admitting: Family Medicine

## 2019-05-15 ENCOUNTER — Telehealth: Payer: Self-pay | Admitting: Family Medicine

## 2019-05-15 MED ORDER — POTASSIUM CHLORIDE CRYS ER 20 MEQ PO TBCR: 20.0000 meq | EXTENDED_RELEASE_TABLET | Freq: Every day | ORAL | 3 refills | Status: DC

## 2019-05-15 NOTE — Telephone Encounter (Signed)
Pt is currently taking furosemide (LASIX) 40 MG tablet per wife to know if he should be taking potassium also .  If so, he needs an [facility] [phone] - [place], [place] AT [address]  Phone:  [phone] Fax:  [phone] please advise contact number [phone]

## 2019-05-15 NOTE — Telephone Encounter (Signed)
Rx sent in. Left a detailed message on verified voice mail.   

## 2019-05-15 NOTE — Telephone Encounter (Signed)
Although his potassium in November, it would be a good idea for him to start on this. Call in Klor-con 20 mEq tabs to take daily, #90 with 3 rf

## 2019-06-26 ENCOUNTER — Encounter: Payer: Self-pay | Admitting: Podiatry

## 2019-06-26 ENCOUNTER — Other Ambulatory Visit: Payer: Self-pay

## 2019-06-26 ENCOUNTER — Ambulatory Visit: Payer: Medicare PPO | Admitting: Podiatry

## 2019-06-26 VITALS — Temp 97.3°F

## 2019-06-26 DIAGNOSIS — B351 Tinea unguium: Secondary | ICD-10-CM | POA: Diagnosis not present

## 2019-06-26 DIAGNOSIS — M79674 Pain in right toe(s): Secondary | ICD-10-CM | POA: Diagnosis not present

## 2019-06-26 DIAGNOSIS — M79675 Pain in left toe(s): Secondary | ICD-10-CM | POA: Diagnosis not present

## 2019-06-26 DIAGNOSIS — I739 Peripheral vascular disease, unspecified: Secondary | ICD-10-CM | POA: Diagnosis not present

## 2019-06-26 NOTE — Patient Instructions (Signed)

## 2019-06-26 NOTE — Progress Notes (Signed)
Subjective: Gabriel Love presents today for follow up of for at risk foot care. Patient has h/o PAD and painful mycotic nails b/l that are difficult to trim. Pain interferes with ambulation. Aggravating factors include wearing enclosed shoe gear. Pain is relieved with periodic professional debridement.   Allergies  Allergen Reactions  . Ivp Dye [Iodinated Diagnostic Agents] Anaphylaxis    Iv dye allergy in 1985, anaphylaxix//a.calhoun- "shocked back to life"  . Lisinopril Cough  . Penicillins Other (See Comments)    Urticaria Has patient had a PCN reaction causing immediate rash, facial/tongue/throat swelling, SOB or lightheadedness with hypotension: no Has patient had a PCN reaction causing severe rash involving mucus membranes or skin necrosis: no Has patient had a PCN reaction that required hospitalization no Has patient had a PCN reaction occurring within the last 10 years: yes If all of the above answers are "NO", then may proceed with Cephalosporin use.      Objective: Vitals:   06/26/19 0940  Temp: (!) 97.3 F (36.3 C)    Pt is a pleasant 79 y.o. year old Caucasian male, morbidly obese, in NAD. AAO x 3.   Vascular Examination:  Capillary fill time to digits <3 seconds b/l. Palpable DP pulses b/l. Nonpalpable PT pulses b/l. Pedal hair absent b/l Skin temperature gradient within normal limits b/l. Trace edema noted b/l feet.  Dermatological Examination: No open wounds bilaterally. No interdigital macerations bilaterally. Toenails 1-5 b/l elongated, dystrophic, thickened, crumbly with subungual debris and tenderness to dorsal palpation. Pedal skin is atrophied b/l.  Musculoskeletal: Normal muscle strength 5/5 to all lower extremity muscle groups bilaterally, no pain crepitus or joint limitation noted with ROM b/l, Clawtoe right great toe and utilizes rollator for ambulation assistance.  Neurological: Protective sensation intact 5/5 intact bilaterally with 10g monofilament  b/l Vibratory sensation intact b/l.  Assessment: 1. Pain due to onychomycosis of toenails of both feet   2. Peripheral vascular disease with claudication (HCC)    Plan: -Toenails 1-5 b/l were debrided in length and girth with sterile nail nippers and dremel without iatrogenic bleeding.  -Patient to continue soft, supportive shoe gear daily. -Patient to report any pedal injuries to medical professional immediately. -Patient/POA to call should there be question/concern in the interim.  Return in about 3 months (around 09/25/2019) for nail trim.

## 2019-07-23 ENCOUNTER — Other Ambulatory Visit: Payer: Self-pay

## 2019-07-24 ENCOUNTER — Encounter: Payer: Self-pay | Admitting: Family Medicine

## 2019-07-24 ENCOUNTER — Ambulatory Visit (INDEPENDENT_AMBULATORY_CARE_PROVIDER_SITE_OTHER): Payer: Medicare PPO | Admitting: Family Medicine

## 2019-07-24 VITALS — BP 130/80 | HR 87 | Temp 97.6°F | Wt 336.4 lb

## 2019-07-24 DIAGNOSIS — M8949 Other hypertrophic osteoarthropathy, multiple sites: Secondary | ICD-10-CM

## 2019-07-24 DIAGNOSIS — I10 Essential (primary) hypertension: Secondary | ICD-10-CM | POA: Diagnosis not present

## 2019-07-24 DIAGNOSIS — M25561 Pain in right knee: Secondary | ICD-10-CM

## 2019-07-24 DIAGNOSIS — G8929 Other chronic pain: Secondary | ICD-10-CM

## 2019-07-24 DIAGNOSIS — M159 Polyosteoarthritis, unspecified: Secondary | ICD-10-CM

## 2019-07-24 DIAGNOSIS — M25562 Pain in left knee: Secondary | ICD-10-CM

## 2019-07-24 DIAGNOSIS — F418 Other specified anxiety disorders: Secondary | ICD-10-CM | POA: Diagnosis not present

## 2019-07-24 MED ORDER — VENLAFAXINE HCL ER 150 MG PO CP24
150.0000 mg | ORAL_CAPSULE | Freq: Every day | ORAL | 5 refills | Status: DC
Start: 2019-07-24 — End: 2020-01-21

## 2019-07-24 NOTE — Progress Notes (Signed)
   Subjective:    Patient ID: Gabriel Love, male    DOB: [birth year], 79 y.o.   MRN: [id number]  HPI Here with his wife for several issues. First his depression and anxiety have been more of a problem for the past few months. He is often sad and tearful, and he worries about things. He has difficulty sleeping most nights. His appetite is stable. He has been on Lexapro 20 mg daily for several years, and it does not seem to work as well as it used to. Also he has a lot of pain in both knees, the left being the worst, and he has fallen several times recently. He does use a rolling walker most of the time. He takes Ibuprofen or Naproxen most days.    Review of Systems  Constitutional: Negative.   Respiratory: Negative.   Cardiovascular: Negative.   Musculoskeletal: Positive for arthralgias and gait problem.  Psychiatric/Behavioral: Positive for decreased concentration, dysphoric mood and sleep disturbance. Negative for agitation, behavioral problems, confusion, hallucinations, self-injury and suicidal ideas. The patient is nervous/anxious.        Objective:   Physical Exam Constitutional:      Appearance: He is obese.  Cardiovascular:     Rate and Rhythm: Normal rate and regular rhythm.     Pulses: Normal pulses.     Heart sounds: Normal heart sounds.  Pulmonary:     Effort: Pulmonary effort is normal.     Breath sounds: Normal breath sounds.  Musculoskeletal:     Comments: Both knees have decreased ROM and crepitus. They are also tender in all joint spaces   Neurological:     Mental Status: He is alert.           Assessment & Plan:  Knee pain, due to osteoarthritis. Refer to Orthopedics to evaluate. For the depression and anxiety, we will stop Lexapro and try Effexor XR 150 mg daily. Recheck in 3-4 weeks.  Alysia Penna, MD

## 2019-08-08 ENCOUNTER — Ambulatory Visit: Payer: Medicare PPO | Admitting: Orthopedic Surgery

## 2019-08-15 ENCOUNTER — Other Ambulatory Visit: Payer: Self-pay

## 2019-08-15 ENCOUNTER — Encounter: Payer: Self-pay | Admitting: Orthopedic Surgery

## 2019-08-15 ENCOUNTER — Ambulatory Visit: Payer: Medicare PPO | Admitting: Orthopedic Surgery

## 2019-08-15 ENCOUNTER — Ambulatory Visit: Payer: Self-pay

## 2019-08-15 ENCOUNTER — Telehealth: Payer: Self-pay | Admitting: Orthopedic Surgery

## 2019-08-15 ENCOUNTER — Ambulatory Visit (INDEPENDENT_AMBULATORY_CARE_PROVIDER_SITE_OTHER): Payer: Medicare PPO

## 2019-08-15 VITALS — Ht 70.0 in | Wt 333.0 lb

## 2019-08-15 DIAGNOSIS — M25561 Pain in right knee: Secondary | ICD-10-CM

## 2019-08-15 DIAGNOSIS — G8929 Other chronic pain: Secondary | ICD-10-CM

## 2019-08-15 DIAGNOSIS — M170 Bilateral primary osteoarthritis of knee: Secondary | ICD-10-CM | POA: Diagnosis not present

## 2019-08-15 DIAGNOSIS — M25562 Pain in left knee: Secondary | ICD-10-CM

## 2019-08-15 MED ORDER — METHYLPREDNISOLONE ACETATE 40 MG/ML IJ SUSP
40.0000 mg | INTRAMUSCULAR | Status: AC | PRN
  Administered 2019-08-15: 40 mg via INTRA_ARTICULAR

## 2019-08-15 MED ORDER — LIDOCAINE HCL 1 % IJ SOLN
5.0000 mL | INTRAMUSCULAR | Status: AC | PRN
  Administered 2019-08-15: 5 mL

## 2019-08-15 MED ORDER — BUPIVACAINE HCL 0.25 % IJ SOLN
4.0000 mL | INTRAMUSCULAR | Status: AC | PRN
  Administered 2019-08-15: 4 mL via INTRA_ARTICULAR

## 2019-08-15 NOTE — Telephone Encounter (Signed)
Patient's wife called. She says the medical supply store needs to know what kind of brace or sleeve he needs. Would like for someone to call her. [phone]

## 2019-08-15 NOTE — Telephone Encounter (Signed)
Advised patient. Stated she will try to go to [facility] next week.

## 2019-08-15 NOTE — Progress Notes (Signed)
Office Visit Note   Patient: Gabriel Love           Date of Birth: 10-Jul-1940           MRN: [id number] Visit Date: 08/15/2019 Requested by: Gabriel Morale, MD Gabriel Love,  Gabriel Love [id number] PCP: Gabriel Morale, MD  Subjective: Chief Complaint  Patient presents with  . Right Knee - Pain  . Left Knee - Pain    HPI: Gabriel Love is an 79 year old patient with bilateral knee pain.  He fell 3 weeks ago lost his balance.  Uses Voltaren gel which helps him at times.  Denies any groin.  Pain.  Reports cracking and popping right knee worse his left.  He does do weightbearing as tolerated with a rolling walker.  His wife is with him today.              ROS: All systems reviewed are negative as they relate to the chief complaint within the history of present illness.  Patient denies  fevers or chills.   Assessment & Plan: Visit Diagnoses:  1. Chronic pain of both knees     Plan: Impression is right knee pain and left knee pain with end-stage severe arthritis present.  Aspiration injection performed in the right knee today.  Gel injection could be the next option depending on how much relief he gets with this right knee injection.  He should come back in 4 to 6 weeks for repeat clinical check and injection in the left knee if that knee becomes severe.  Follow-Up Instructions: Return if symptoms worsen or fail to improve.   Orders:  Orders Placed This Encounter  Procedures  . XR KNEE 3 VIEW LEFT  . XR KNEE 3 VIEW RIGHT   No orders of the defined types were placed in this encounter.     Procedures: Large Joint Inj: R knee on 08/15/2019 2:04 PM Indications: diagnostic evaluation, joint swelling and pain Details: 18 G 1.5 in needle, superolateral approach  Arthrogram: No  Medications: 5 mL lidocaine 1 %; 40 mg methylPREDNISolone acetate 40 MG/ML; 4 mL bupivacaine 0.25 % Aspirate: 30 mL serous Outcome: tolerated well, no immediate complications Procedure, treatment  alternatives, risks and benefits explained, specific risks discussed. Consent was given by the patient. Immediately prior to procedure a time out was called to verify the correct patient, procedure, equipment, support staff and site/side marked as required. Patient was prepped and draped in the usual sterile fashion.       Clinical Data: No additional findings.  Objective: Vital Signs: Ht 5\' 10"  (1.778 m)   Wt (!) 333 lb (151 kg)   BMI 47.78 kg/m   Physical Exam:   Constitutional: Patient appears well-developed HEENT:  Head: Normocephalic Eyes:EOM are normal Neck: Normal range of motion Cardiovascular: Normal rate Pulmonary/chest: Effort normal Neurologic: Patient is alert Skin: Skin is warm Psychiatric: Patient has normal mood and affect    Ortho Exam: Ortho exam demonstrates varus alignment.  Pedal pulses palpable but venous stasis changes are present in both legs worse on the right than the left.  Moderate effusion right knee no effusion left knee.  Extensor mechanism is intact.  5 degree flexion contracture bilaterally with flexion past 90 in both knees.  No groin pain with internal X rotation of the leg.  Specialty Comments:  No specialty comments available.  Imaging: XR KNEE 3 VIEW LEFT  Result Date: 08/15/2019 AP lateral merchant left knee reviewed.  Slight varus alignment  present.  Moderate degenerative changes noted within the medial lateral and patellofemoral compartments.  Changes not as severe on the left knee compared to the right knee.  No acute fracture.  XR KNEE 3 VIEW RIGHT  Result Date: 08/15/2019 AP lateral merchant right knee reviewed.  End-stage tricompartmental arthritis is present worse in the medial compartment.  Spurring and bone-on-bone changes noted.  Similar spurring in the lateral compartment.  Overall alignment is slight varus.  Patellofemoral arthritis also noted.  No acute fracture.    PMFS History: Patient Active Problem List    Diagnosis Date Noted  . Depression with anxiety 07/24/2019  . Morbid obesity ([place]) 04/04/2018  . Bilateral leg edema 02/07/2017  . Osteoarthritis 01/28/2017  . Urge and stress incontinence 01/28/2017  . History of colonic polyps   . Colon polyp 08/07/2013  . 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 Nonspecific abnormal finding in stool contents 05/29/2013  . Atherosclerotic peripheral vascular disease with intermittent claudication ([place]) 06/14/2012  . Spinal stenosis of lumbar region 06/14/2012  . 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 Hyperlipidemia 11/23/2006  . Essential hypertension 11/23/2006  . HEMATURIA 11/23/2006  . 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   Past Medical History:  Diagnosis Date  . Abdominal hernia   . Allergy   . Blood transfusion without reported diagnosis 12/2006   At [facility] with hip replacement - 2 units transfused  . Cancer ([place])    skin cancers  . Cataract   . Depression   . DJD (degenerative joint disease)    shoulders bilateral  . Environmental allergies   . Full dentures   . Hearing loss    bilateral hearing aids  . History of urinary tract infection   . Hyperlipidemia    under control  . Hypertension    "borderline"  . Nonspecific abnormal finding in stool contents   . Other general symptoms(780.99)   . Prostate hypertrophy    sees Dr. Gaynelle Arabian   . Urinary leakage     Family History  Problem Relation Age of Onset  . Hypertension Mother   . Diabetes Mother   . Heart failure Mother        cad/mi with rupture ventricle  . Hyperlipidemia Father   . Colon polyps Father   . Cancer Neg Hx        colon or prostate  . Colon cancer Neg Hx   . Esophageal cancer Neg Hx   . Rectal cancer Neg Hx   . Stomach cancer Neg Hx     Past Surgical History:  Procedure Laterality Date  . CATARACT EXTRACTION W/PHACO Right 03/25/2014   Procedure: CATARACT EXTRACTION PHACO AND INTRAOCULAR LENS PLACEMENT;  CDE:9.76;  Surgeon: Williams Che, MD;  Location: [facility];  Service: Ophthalmology;  Laterality: Right;  . CATARACT EXTRACTION W/PHACO Left 06/24/2014   Procedure: CATARACT EXTRACTION PHACO AND INTRAOCULAR LENS PLACEMENT (IOC);  Surgeon: Williams Che, MD;  Location: [facility];  Service: Ophthalmology;  Laterality: Left;  CDE:8.45  . CIRCUMCISION N/A 03/26/2016   Procedure: CIRCUMCISION ADULT;  Surgeon: Carolan Clines, MD;  Location: [facility];  Service: Urology;  Laterality: N/A;  . CIRCUMCISION, NON-NEWBORN  1985  . COLONOSCOPY  06-19-13, 03/05/15   per Dr. Deatra Ina, mass at the ileocecal valve plus several polyps, pathology benign, repeat in one year, polyp 2016   . COLONOSCOPY WITH PROPOFOL N/A 07/29/2015   Procedure: COLONOSCOPY WITH PROPOFOL;  Surgeon: Manus Gunning, MD;  Location: [facility];  Service: Gastroenterology;  Laterality: N/A;  . JOINT REPLACEMENT  12/2006   total left hip per Dr. Percell Miller   . LAPAROSCOPIC PARTIAL COLECTOMY N/A 08/07/2013   Procedure: LAPAROSCOPIC right PARTIAL COLECTOMY;  Surgeon: Leighton Ruff, MD;  Location: [facility];  Service: General;  Laterality: N/A;  . MULTIPLE TOOTH EXTRACTIONS    . PROSTATE SURGERY  03-29-11   had CTT per Dr. Gaynelle Arabian, in office   Social History   Occupational History  . Occupation: Retired  Tobacco Use  . Smoking status: Former Smoker    Packs/day: 1.00    Years: 5.00    Pack years: 5.00    Types: Cigarettes    Quit date: 03/22/1968    Years since quitting: 51.4  . Smokeless tobacco: Former Systems developer    Types: Chew    Quit date: 09/20/2015  . Tobacco comment: occassionally  Substance and Sexual Activity  . Alcohol use: No    Alcohol/week: 0.0 standard drinks  . Drug use: No  . Sexual activity: Not on file

## 2019-08-15 NOTE — Telephone Encounter (Signed)
As per what is on the prescription he needs a thin compression sleeve for the right knee.  Can you call her and inform her of same.  Thanks

## 2019-08-15 NOTE — Telephone Encounter (Signed)
Please advise 

## 2019-08-21 ENCOUNTER — Telehealth: Payer: Self-pay

## 2019-08-21 NOTE — Telephone Encounter (Signed)
Patient's wife Gabriel Love called stating that Gabriel Love does not have the knee brace that the Rx is written for.  They have a similar brace and would like to know if this is okay to get?  CB# [phone].  Please advise.  Thank you.

## 2019-08-21 NOTE — Telephone Encounter (Signed)
Pls advise. Thanks.  

## 2019-08-22 NOTE — Telephone Encounter (Signed)
I would ask dr. Marlou Sa, I never saw Ott so I'm not sure what brace he is talking about. Not evident from note

## 2019-08-23 NOTE — Telephone Encounter (Signed)
IC advised per Dr Dean 

## 2019-08-23 NOTE — Telephone Encounter (Signed)
Ok fior similar brace pls calal thx

## 2019-08-23 NOTE — Telephone Encounter (Signed)
Pls advise.  

## 2019-09-26 ENCOUNTER — Ambulatory Visit: Payer: Medicare PPO | Admitting: Podiatry

## 2019-09-26 ENCOUNTER — Encounter: Payer: Self-pay | Admitting: Podiatry

## 2019-09-26 ENCOUNTER — Other Ambulatory Visit: Payer: Self-pay

## 2019-09-26 DIAGNOSIS — M21372 Foot drop, left foot: Secondary | ICD-10-CM

## 2019-09-26 DIAGNOSIS — M21371 Foot drop, right foot: Secondary | ICD-10-CM | POA: Diagnosis not present

## 2019-09-26 DIAGNOSIS — M79674 Pain in right toe(s): Secondary | ICD-10-CM | POA: Diagnosis not present

## 2019-09-26 DIAGNOSIS — M79675 Pain in left toe(s): Secondary | ICD-10-CM

## 2019-09-26 DIAGNOSIS — B351 Tinea unguium: Secondary | ICD-10-CM

## 2019-09-26 DIAGNOSIS — I70219 Atherosclerosis of native arteries of extremities with intermittent claudication, unspecified extremity: Secondary | ICD-10-CM

## 2019-09-26 NOTE — Patient Instructions (Addendum)
We will have you evaluated by our Pedorthist for a device to assist with bilateral foot drop. This will help in avoiding tripping when you walk.   Peripheral Vascular Disease Peripheral vascular disease (PVD) is a disease of the blood vessels. A simple term for PVD is poor circulation. In most cases, PVD narrows the blood vessels that carry blood from your heart to the rest of your body. This can result in a decreased supply of blood to your arms, legs, and internal organs, like your stomach or kidneys. However, it most often affects a person's lower legs and feet. There are two types of PVD.  Organic PVD. This is the more common type. It is caused by damage to the structure of blood vessels.  Functional PVD. This is caused by conditions that make blood vessels contract and tighten (spasm). Without treatment, PVD tends to get worse over time. PVD can also lead to acute limb ischemia. This is when an arm or leg suddenly has trouble getting enough blood. This is a medical emergency. What are the causes?  Each type of PVD has many different causes. The most common cause of PVD is buildup of a fatty material (plaque) inside your arteries (atherosclerosis). Small amounts of plaque can break off from the walls of the blood vessels and become lodged in a smaller artery. This blocks blood flow and can cause acute limb ischemia. Other common causes of PVD include:  Blood clots that form inside of blood vessels.  Injuries to blood vessels.  Diseases that cause inflammation of blood vessels or cause blood vessel spasms.  Health behaviors and health history that increase your risk of developing PVD. What increases the risk? You are more likely to develop this condition if:  You have a family history of PVD.  You have certain medical conditions, including: ? High cholesterol. ? Diabetes. ? High blood pressure (hypertension). ? Coronary heart disease. ? Past problems with blood clots. ? Past  injury, such as burns or a broken bone. These may have damaged blood vessels in your limbs. ? Buerger disease. This is caused by inflamed blood vessels in your hands and feet. ? Some forms of arthritis. ? Rare birth defects that affect the arteries in your legs. ? Kidney disease.  You use tobacco or smoke.  You do not get enough exercise.  You are obese.  You are age 80 or older. What are the signs or symptoms? This condition may cause different symptoms. Your symptoms depend on what part of your body is not getting enough blood. Some common signs and symptoms include:  Cramps in your lower legs. This may be a symptom of poor leg circulation (claudication).  Pain and weakness in your legs. This happens while you are physically active but goes away when you rest (intermittent claudication).  Leg pain when at rest.  Leg numbness, tingling, or weakness.  Coldness in a leg or foot, especially when compared with the other leg.  Skin or hair changes. These can include: ? Hair loss. ? Shiny skin. ? Pale or bluish skin. ? Thick toenails.  Inability to get or maintain an erection (erectile dysfunction).  Fatigue. People with PVD are more likely to develop ulcers and sores on their toes, feet, or legs. These may take longer than normal to heal. How is this diagnosed? This condition is diagnosed based on:  Your signs and symptoms.  A physical exam and your medical history.  Other tests to find out what is causing your  PVD and to determine its severity. Tests may include: ? Blood pressure recordings from your arms and legs and measurements of the strength of your pulses (pulse volume recordings). ? Imaging studies using sound waves to take pictures of the blood flow through your blood vessels (Doppler ultrasound). ? Injecting a dye into your blood vessels before having imaging studies using:  X-rays (angiogram or arteriogram).  Computer-generated X-rays (CT angiogram).  A  powerful electromagnetic field and a computer (magnetic resonance angiogram or MRA). How is this treated? Treatment for PVD depends on the cause of your condition and how severe your symptoms are. It also depends on your age. Underlying causes need to be treated and controlled. These include long-term (chronic) conditions, such as diabetes, high cholesterol, and high blood pressure. Treatment includes:  Lifestyle changes, such as: ? Quitting smoking. ? Exercising regularly. ? Following a low-fat, low-cholesterol diet.  Taking medicines, such as: ? Blood thinners to prevent blood clots. ? Medicines to improve blood flow. ? Medicines to improve your blood cholesterol levels.  Surgical procedures, such as: ? A procedure that uses an inflated balloon to open a blocked artery and improve blood flow (angioplasty). ? A procedure to put in a wire mesh tube to keep a blocked artery open (stent implant). ? Surgery to reroute blood flow around a blocked artery (peripheral bypass surgery). ? Surgery to remove dead tissue from an infected wound on the affected limb. ? Amputation. This is surgical removal of the affected limb. It may be necessary in cases of acute limb ischemia where there has been no improvement through medical or surgical treatments. Follow these instructions at home: Lifestyle  Do not use any products that contain nicotine or tobacco, such as cigarettes and e-cigarettes. If you need help quitting, ask your health care provider.  Lose weight if you are overweight, and maintain a healthy weight as discussed by your health care provider.  Eat a diet that is low in fat and cholesterol. If you need help, ask your health care provider.  Exercise regularly. Ask your health care provider to suggest some good activities for you. General instructions  Take over-the-counter and prescription medicines only as told by your health care provider.  Take good care of your feet: ? Wear  comfortable shoes that fit well. ? Check your feet often for any cuts or sores.  Keep all follow-up visits as told by your health care provider. This is important. Contact a health care provider if:  You have cramps in your legs while walking.  You have leg pain when you are at rest.  You have coldness in a leg or foot.  Your skin changes.  You have erectile dysfunction.  You have cuts or sores on your feet that are not healing. Get help right away if:  Your arm or leg turns cold, numb, and blue.  Your arms or legs become red, warm, swollen, painful, or numb.  You have chest pain or trouble breathing.  You suddenly have weakness in your face, arm, or leg.  You become very confused or lose the ability to speak.  You suddenly have a very bad headache or lose your vision. Summary  Peripheral vascular disease (PVD) is a disease of the blood vessels.  In most cases, PVD narrows the blood vessels that carry blood from your heart to the rest of your body.  PVD may cause different symptoms. Your symptoms depend on what part of your body is not getting enough blood.  Treatment  for PVD depends on the cause of your condition and how severe your symptoms are. This information is not intended to replace advice given to you by your health care provider. Make sure you discuss any questions you have with your health care provider. Document Revised: 02/18/2017 Document Reviewed: 04/15/2016 Elsevier Patient Education  2020 [employer].

## 2019-09-30 NOTE — Progress Notes (Signed)
Subjective: Gabriel Love is a pleasant 79 y.o. male patient seen today at [facility]. Pt has h/o NIDDM with PAD and painful thick toenails that are difficult to trim. Pain interferes with ambulation. Aggravating factors include wearing enclosed shoe gear. Pain is relieved with periodic professional debridement.   Patient's wife is present during today's visit. He states he has been tripping quite often. States it's hard for him to pick his feet up when walking. Wife has taken up all of the fall hazards such as throw rugs in the home, but he still trips. Has not had a fall.   Past Medical History:  Diagnosis Date  . Abdominal hernia   . Allergy   . Blood transfusion without reported diagnosis 12/2006   At [facility] with hip replacement - 2 units transfused  . Cancer ([place])    skin cancers  . Cataract   . Depression   . DJD (degenerative joint disease)    shoulders bilateral  . Environmental allergies   . Full dentures   . Hearing loss    bilateral hearing aids  . History of urinary tract infection   . Hyperlipidemia    under control  . Hypertension    "borderline"  . Nonspecific abnormal finding in stool contents   . Other general symptoms(780.99)   . Prostate hypertrophy    sees Dr. Gaynelle Arabian   . Urinary leakage     Patient Active Problem List   Diagnosis Date Noted  . Depression with anxiety 07/24/2019  . Morbid obesity ([place]) 04/04/2018  . Bilateral leg edema 02/07/2017  . Osteoarthritis 01/28/2017  . Urge and stress incontinence 01/28/2017  . History of colonic polyps   . Colon polyp 08/07/2013  . 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 Nonspecific abnormal finding in stool contents 05/29/2013  . Atherosclerotic peripheral vascular disease with intermittent claudication ([place]) 06/14/2012  . Spinal stenosis of lumbar region 06/14/2012  . 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 Hyperlipidemia 11/23/2006  . Essential hypertension  11/23/2006  . HEMATURIA 11/23/2006  . 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    Current Outpatient Medications on File Prior to Visit  Medication Sig Dispense Refill  . albuterol (PROVENTIL HFA;VENTOLIN HFA) 108 (90 Base) MCG/ACT inhaler INHALE 2 PUFFS BY MOUTH EVERY 4 HOURS 8.5 g 6  . Ascorbic Acid (VITAMIN C PO) Take by mouth daily. Unsure of dosage.    Marland Kitchen aspirin EC 81 MG tablet Take 81 mg by mouth daily.    Marland Kitchen atorvastatin (LIPITOR) 40 MG tablet Take 1 tablet (40 mg total) by mouth daily. 90 tablet 3  . clotrimazole-betamethasone (LOTRISONE) cream Apply 1 application topically daily. Applies to affected area.  0  . furosemide (LASIX) 40 MG tablet TAKE 1 TABLET(40 MG) BY MOUTH TWICE DAILY 180 tablet 3  . metoprolol succinate (TOPROL-XL) 50 MG 24 hr tablet TAKE 1 TABLET BY MOUTH ONCE DAILY WITH OR IMMEDIATELY FOLLOWING A MEAL 90 tablet 3  . Multiple Vitamins-Minerals (CENTRUM ADULTS PO) Take by mouth.    . mupirocin ointment (BACTROBAN) 2 % Apply topically 2 (two) times daily. 22 g 1  . MYRBETRIQ 50 MG TB24 tablet TK 1 T PO QD    . potassium chloride SA (KLOR-CON) 20 MEQ tablet Take 1 tablet (20 mEq total) by mouth daily. 90 tablet 3  . traMADol-acetaminophen (ULTRACET) 37.5-325 MG tablet Take 1 tablet by mouth every 6 (six) hours as needed. 30 tablet 0  . venlafaxine XR (EFFEXOR XR)  150 MG 24 hr capsule Take 1 capsule (150 mg total) by mouth daily with breakfast. 30 capsule 5   No current facility-administered medications on file prior to visit.    Allergies  Allergen Reactions  . Ivp Dye [Iodinated Diagnostic Agents] Anaphylaxis    Iv dye allergy in 1985, anaphylaxix//a.calhoun- "shocked back to life"  . Lisinopril Cough  . Penicillins Other (See Comments)    Urticaria Has patient had a PCN reaction causing immediate rash, facial/tongue/throat swelling, SOB or lightheadedness with hypotension: no Has patient had a PCN reaction causing severe rash involving mucus membranes or skin  necrosis: no Has patient had a PCN reaction that required hospitalization no Has patient had a PCN reaction occurring within the last 10 years: yes If all of the above answers are "NO", then may proceed with Cephalosporin use.     Objective: Physical Exam  General: Gabriel Love is a pleasant 79 y.o. Caucasian male, morbidly obese in NAD. AAO x 3.   Vascular:  Capillary fill time to digits <3 seconds b/l lower extremities. Palpable DP pulse(s) b/l lower extremities Nonpalpable PT pulse(s) b/l lower extremities. Pedal hair absent. Lower extremity skin temperature gradient within normal limits. No pain with calf compression b/l. Trace edema noted b/l lower extremities.  Dermatological:  Pedal skin with normal turgor, texture and tone bilaterally. No open wounds bilaterally. No interdigital macerations bilaterally. Toenails 1-5 b/l elongated, discolored, dystrophic, thickened, crumbly with subungual debris and tenderness to dorsal palpation.  Musculoskeletal:  Muscle strength 5/5 with plantarflexion, inversion, and eversion b/l. Dropfoot b/l lower extremities. No pain crepitus or joint limitation noted with ROM b/l. No gross bony deformities bilaterally.  Neurological:  Protective sensation intact 5/5 intact bilaterally with 10g monofilament b/l. Vibratory sensation intact b/l. Proprioception intact bilaterally.  Assessment and Plan:  1. Pain due to onychomycosis of toenails of both feet   2. Foot drop, bilateral   3. Atherosclerotic peripheral vascular disease with intermittent claudication (HCC)    -Examined patient. -Toenails 1-5 b/l were debrided in length and girth with sterile nail nippers and dremel without iatrogenic bleeding.  -Patient to report any pedal injuries to medical professional immediately. -Will schedule a Pedorthist's evaluation for bracing for bilateral foot drop. -Patient to continue soft, supportive shoe gear daily. -Patient/POA to call should there be  question/concern in the interim.  Return in about 3 months (around 12/27/2019) for diabetic nail and callus trim.  Marzetta Board, DPM

## 2019-10-10 ENCOUNTER — Emergency Department (HOSPITAL_COMMUNITY): Payer: Medicare PPO

## 2019-10-10 ENCOUNTER — Encounter (HOSPITAL_COMMUNITY): Payer: Self-pay | Admitting: *Deleted

## 2019-10-10 ENCOUNTER — Emergency Department (HOSPITAL_COMMUNITY)
Admission: EM | Admit: 2019-10-10 | Discharge: 2019-10-10 | Disposition: A | Discharge status: Home / Self Care | Payer: Medicare PPO | Attending: Emergency Medicine | Admitting: Emergency Medicine

## 2019-10-10 DIAGNOSIS — Z85828 Personal history of other malignant neoplasm of skin: Secondary | ICD-10-CM | POA: Diagnosis not present

## 2019-10-10 DIAGNOSIS — W19XXXA Unspecified fall, initial encounter: Secondary | ICD-10-CM | POA: Diagnosis not present

## 2019-10-10 DIAGNOSIS — W1789XA Other fall from one level to another, initial encounter: Secondary | ICD-10-CM | POA: Insufficient documentation

## 2019-10-10 DIAGNOSIS — R0902 Hypoxemia: Secondary | ICD-10-CM | POA: Diagnosis not present

## 2019-10-10 DIAGNOSIS — I10 Essential (primary) hypertension: Secondary | ICD-10-CM | POA: Diagnosis not present

## 2019-10-10 DIAGNOSIS — Y929 Unspecified place or not applicable: Secondary | ICD-10-CM | POA: Insufficient documentation

## 2019-10-10 DIAGNOSIS — S82832A Other fracture of upper and lower end of left fibula, initial encounter for closed fracture: Secondary | ICD-10-CM | POA: Insufficient documentation

## 2019-10-10 DIAGNOSIS — Y939 Activity, unspecified: Secondary | ICD-10-CM | POA: Diagnosis not present

## 2019-10-10 DIAGNOSIS — Y999 Unspecified external cause status: Secondary | ICD-10-CM | POA: Insufficient documentation

## 2019-10-10 DIAGNOSIS — Z87891 Personal history of nicotine dependence: Secondary | ICD-10-CM | POA: Insufficient documentation

## 2019-10-10 DIAGNOSIS — S8262XA Displaced fracture of lateral malleolus of left fibula, initial encounter for closed fracture: Secondary | ICD-10-CM | POA: Diagnosis not present

## 2019-10-10 DIAGNOSIS — R609 Edema, unspecified: Secondary | ICD-10-CM | POA: Diagnosis not present

## 2019-10-10 DIAGNOSIS — S99912A Unspecified injury of left ankle, initial encounter: Secondary | ICD-10-CM | POA: Diagnosis present

## 2019-10-10 MED ORDER — OXYCODONE HCL 5 MG PO TABS: 5.0000 mg | ORAL_TABLET | ORAL | 0 refills | Status: DC | PRN

## 2019-10-10 NOTE — ED Notes (Signed)
md at bedside assessing splint, pt has equal color and cap refill in bilateral toes, pt denies pain, numbness or tingling in L leg.  MD approved splint.  Pt verbalized d/c instructions with instructions to follow up with ortho surg and to return for any numbness or concern in L leg.  Pt from dpt with sig other via wc.

## 2019-10-10 NOTE — ED Triage Notes (Signed)
[address] out of truck this am, swelling left ankle

## 2019-10-10 NOTE — ED Provider Notes (Signed)
[facility] Emergency Department Provider Note MRN:  [id number]  Arrival date & time: 10/10/19     Chief Complaint   Fall and Ankle Pain   History of Present Illness   Gabriel Love is a 79 y.o. year-old male with a history of HTN presenting to the ED with chief complaint of fall.  Patient fell out of a stationary truck landing awkwardly on the left ankle, unable to bear weight or stand since the fall.  Denies head trauma, no loss of consciousness, no other injuries.  Pain in the left ankle is moderate, worse with motion or palpation.  Review of Systems  A complete 10 system review of systems was obtained and all systems are negative except as noted in the HPI and PMH.   Patient's Health History    Past Medical History:  Diagnosis Date  . Abdominal hernia   . Allergy   . Blood transfusion without reported diagnosis 12/2006   At [facility] with hip replacement - 2 units transfused  . Cancer ([place])    skin cancers  . Cataract   . Depression   . DJD (degenerative joint disease)    shoulders bilateral  . Environmental allergies   . Full dentures   . Hearing loss    bilateral hearing aids  . History of urinary tract infection   . Hyperlipidemia    under control  . Hypertension    "borderline"  . Nonspecific abnormal finding in stool contents   . Other general symptoms(780.99)   . Prostate hypertrophy    sees Dr. Gaynelle Arabian   . Urinary leakage     Past Surgical History:  Procedure Laterality Date  . CATARACT EXTRACTION W/PHACO Right 03/25/2014   Procedure: CATARACT EXTRACTION PHACO AND INTRAOCULAR LENS PLACEMENT; CDE:9.76;  Surgeon: Williams Che, MD;  Location: [facility];  Service: Ophthalmology;  Laterality: Right;  . CATARACT EXTRACTION W/PHACO Left 06/24/2014   Procedure: CATARACT EXTRACTION PHACO AND INTRAOCULAR LENS PLACEMENT (IOC);  Surgeon: Williams Che, MD;  Location: [facility];  Service: Ophthalmology;  Laterality: Left;  CDE:8.45  .  CIRCUMCISION N/A 03/26/2016   Procedure: CIRCUMCISION ADULT;  Surgeon: Carolan Clines, MD;  Location: [facility];  Service: Urology;  Laterality: N/A;  . CIRCUMCISION, NON-NEWBORN  1985  . COLONOSCOPY  06-19-13, 03/05/15   per Dr. Deatra Ina, mass at the ileocecal valve plus several polyps, pathology benign, repeat in one year, polyp 2016   . COLONOSCOPY WITH PROPOFOL N/A 07/29/2015   Procedure: COLONOSCOPY WITH PROPOFOL;  Surgeon: Manus Gunning, MD;  Location: [facility];  Service: Gastroenterology;  Laterality: N/A;  . JOINT REPLACEMENT  12/2006   total left hip per Dr. Percell Miller   . LAPAROSCOPIC PARTIAL COLECTOMY N/A 08/07/2013   Procedure: LAPAROSCOPIC right PARTIAL COLECTOMY;  Surgeon: Leighton Ruff, MD;  Location: [facility];  Service: General;  Laterality: N/A;  . MULTIPLE TOOTH EXTRACTIONS    . PROSTATE SURGERY  03-29-11   had CTT per Dr. Gaynelle Arabian, in office    Family History  Problem Relation Age of Onset  . Hypertension Mother   . Diabetes Mother   . Heart failure Mother        cad/mi with rupture ventricle  . Hyperlipidemia Father   . Colon polyps Father   . Cancer Neg Hx        colon or prostate  . Colon cancer Neg Hx   . Esophageal cancer Neg Hx   . Rectal cancer Neg Hx   . Stomach  cancer Neg Hx     Social History   Socioeconomic History  . Marital status: Married    Spouse name: Not on file  . Number of children: 1  . Years of education: Not on file  . Highest education level: Not on file  Occupational History  . Occupation: Retired  Tobacco Use  . Smoking status: Former Smoker    Packs/day: 1.00    Years: 5.00    Pack years: 5.00    Types: Cigarettes    Quit date: 03/22/1968    Years since quitting: 51.5  . Smokeless tobacco: Former Systems developer    Types: Chew    Quit date: 09/20/2015  . Tobacco comment: occassionally  Substance and Sexual Activity  . Alcohol use: No    Alcohol/week: 0.0 standard drinks  . Drug use: No  . Sexual activity: Not on file  Other  Topics Concern  . 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 Difficulty of Paying Living Expenses:   Food Insecurity:   . Worried About Charity fundraiser in the Last Year:   . Arboriculturist in the Last Year:   Transportation Needs:   . Film/video editor (Medical):   Marland Kitchen Lack of Transportation (Non-Medical):   Physical Activity:   . Days of Exercise per Week:   . Minutes of Exercise per Session:   Stress:   . Feeling of Stress :   Social Connections:   . Frequency of Communication with Friends and Family:   . Frequency of Social Gatherings with Friends and Family:   . Attends Religious Services:   . Active Member of Clubs or Organizations:   . Attends Archivist Meetings:   Marland Kitchen Marital Status:   Intimate Partner Violence:   . Fear of Current or Ex-Partner:   . Emotionally Abused:   Marland Kitchen Physically Abused:   . Sexually Abused:      Physical Exam   Vitals:   10/10/19 1024 10/10/19 1111  BP: (!) 143/63 (!) 147/80  Pulse: 77 75  Resp: 18 18  Temp: 98.8 F (37.1 C) 98.1 F (36.7 C)  SpO2: 92% 92%    CONSTITUTIONAL: Chronically ill-appearing, NAD NEURO:  Alert and oriented x 3, no focal deficits EYES:  eyes equal and reactive ENT/NECK:  no LAD, no JVD CARDIO: Regular rate, well-perfused, normal S1 and S2 PULM:  CTAB no wheezing or rhonchi GI/GU:  normal bowel sounds, non-distended, non-tender MSK/SPINE: Edema and ecchymosis to the medial aspect of the left ankle, neurovascularly intact distally SKIN:  no rash, atraumatic PSYCH:  Appropriate speech and behavior  *Additional and/or pertinent findings included in MDM below  Diagnostic and Interventional Summary    EKG Interpretation  Date/Time:    Ventricular Rate:    PR Interval:    QRS Duration:     QT Interval:    QTC Calculation:   R Axis:     Text Interpretation:        Labs Reviewed - No data to display  DG Ankle Left Port  Final Result      Medications - No data to display   Procedures  /  Critical Care Procedures  ED Course and  Medical Decision Making  I have reviewed the triage vital signs, the nursing notes, and pertinent available records from the EMR.  Listed above are laboratory and imaging tests that I personally ordered, reviewed, and interpreted and then considered in my medical decision making (see below for details).      Isolated left ankle injury, awaiting x-ray.  X-ray reveals fracture of the distal fibula.  No other injuries, neurovascularly intact, appropriate for discharge with Ortho follow-up.  Barth Kirks. Sedonia Small, MD [place] [email or web address]  Final Clinical Impressions(s) / ED Diagnoses     ICD-10-CM   1. Closed fracture of distal end of left fibula, unspecified fracture morphology, initial encounter  S82.832A   2. Fall  [email or web address].Merril Abbe DG Ankle Left Port    DG Ankle Left Port    ED Discharge Orders         Ordered    oxyCODONE (ROXICODONE) 5 MG immediate release tablet  Every 4 hours PRN     Discontinue  Reprint     10/10/19 1209           Discharge Instructions Discussed with and Provided to Patient:     Discharge Instructions     You were evaluated in the Emergency Department and after careful evaluation, we did not find any emergent condition requiring admission or further testing in the hospital.  Your exam/testing today is overall reassuring.  Your symptoms seem to be due to a broken bone in the ankle.  Please keep the splint on and keep it clean and dry and follow-up with the orthopedic specialist to ensure it heals properly.  We recommend Tylenol 1000 mg every 4-6 hours.  You can use the oxycodone medication for more significant pain.  Please return to the Emergency Department  if you experience any worsening of your condition.   Thank you for allowing [place] to be a part of your care.       Maudie Flakes, MD 10/10/19 1210

## 2019-10-10 NOTE — Discharge Instructions (Signed)
You were evaluated in the Emergency Department and after careful evaluation, we did not find any emergent condition requiring admission or further testing in the hospital.  Your exam/testing today is overall reassuring.  Your symptoms seem to be due to a broken bone in the ankle.  Please keep the splint on and keep it clean and dry and follow-up with the orthopedic specialist to ensure it heals properly.  We recommend Tylenol 1000 mg every 4-6 hours.  You can use the oxycodone medication for more significant pain.  Please return to the Emergency Department if you experience any worsening of your condition.   Thank you for allowing [place] to be a part of your care.

## 2019-10-14 ENCOUNTER — Other Ambulatory Visit: Payer: Self-pay | Admitting: Family Medicine

## 2019-10-15 ENCOUNTER — Other Ambulatory Visit: Payer: Medicare PPO | Admitting: Orthotics

## 2019-10-15 DIAGNOSIS — S8265XA Nondisplaced fracture of lateral malleolus of left fibula, initial encounter for closed fracture: Secondary | ICD-10-CM | POA: Diagnosis not present

## 2019-10-21 ENCOUNTER — Emergency Department (HOSPITAL_COMMUNITY)
Admission: EM | Admit: 2019-10-21 | Discharge: 2019-10-21 | Disposition: A | Discharge status: Home / Self Care | Payer: Medicare PPO | Attending: Emergency Medicine | Admitting: Emergency Medicine

## 2019-10-21 ENCOUNTER — Other Ambulatory Visit: Payer: Self-pay

## 2019-10-21 ENCOUNTER — Encounter (HOSPITAL_COMMUNITY): Payer: Self-pay

## 2019-10-21 DIAGNOSIS — I10 Essential (primary) hypertension: Secondary | ICD-10-CM | POA: Insufficient documentation

## 2019-10-21 DIAGNOSIS — Z7982 Long term (current) use of aspirin: Secondary | ICD-10-CM | POA: Insufficient documentation

## 2019-10-21 DIAGNOSIS — C4491 Basal cell carcinoma of skin, unspecified: Secondary | ICD-10-CM | POA: Diagnosis not present

## 2019-10-21 DIAGNOSIS — N3000 Acute cystitis without hematuria: Secondary | ICD-10-CM | POA: Diagnosis not present

## 2019-10-21 DIAGNOSIS — R410 Disorientation, unspecified: Secondary | ICD-10-CM | POA: Diagnosis not present

## 2019-10-21 DIAGNOSIS — D120 Benign neoplasm of cecum: Secondary | ICD-10-CM | POA: Diagnosis not present

## 2019-10-21 DIAGNOSIS — Z79899 Other long term (current) drug therapy: Secondary | ICD-10-CM | POA: Diagnosis not present

## 2019-10-21 DIAGNOSIS — Z87891 Personal history of nicotine dependence: Secondary | ICD-10-CM | POA: Insufficient documentation

## 2019-10-21 DIAGNOSIS — R21 Rash and other nonspecific skin eruption: Secondary | ICD-10-CM | POA: Diagnosis not present

## 2019-10-21 DIAGNOSIS — R0902 Hypoxemia: Secondary | ICD-10-CM | POA: Diagnosis not present

## 2019-10-21 DIAGNOSIS — R404 Transient alteration of awareness: Secondary | ICD-10-CM | POA: Diagnosis not present

## 2019-10-21 DIAGNOSIS — R442 Other hallucinations: Secondary | ICD-10-CM | POA: Diagnosis not present

## 2019-10-21 DIAGNOSIS — R443 Hallucinations, unspecified: Secondary | ICD-10-CM | POA: Diagnosis present

## 2019-10-21 LAB — URINALYSIS, ROUTINE W REFLEX MICROSCOPIC
Bacteria, UA: NONE SEEN
Bilirubin Urine: NEGATIVE
Glucose, UA: NEGATIVE mg/dL
Hgb urine dipstick: NEGATIVE
Ketones, ur: NEGATIVE mg/dL
Nitrite: POSITIVE — AB
Protein, ur: NEGATIVE mg/dL
Specific Gravity, Urine: 1.019 (ref 1.005–1.030)
pH: 5 (ref 5.0–8.0)

## 2019-10-21 LAB — CBC WITH DIFFERENTIAL/PLATELET
Abs Immature Granulocytes: 0.1 10*3/uL — ABNORMAL HIGH (ref 0.00–0.07)
Basophils Absolute: 0.1 10*3/uL (ref 0.0–0.1)
Basophils Relative: 1 %
Eosinophils Absolute: 0.4 10*3/uL (ref 0.0–0.5)
Eosinophils Relative: 3 %
HCT: 44.5 % (ref 39.0–52.0)
Hemoglobin: 14.3 g/dL (ref 13.0–17.0)
Immature Granulocytes: 1 %
Lymphocytes Relative: 17 %
Lymphs Abs: 2.2 10*3/uL (ref 0.7–4.0)
MCH: 29.6 pg (ref 26.0–34.0)
MCHC: 32.1 g/dL (ref 30.0–36.0)
MCV: 92.1 fL (ref 80.0–100.0)
Monocytes Absolute: 1.1 10*3/uL — ABNORMAL HIGH (ref 0.1–1.0)
Monocytes Relative: 8 %
Neutro Abs: 8.8 10*3/uL — ABNORMAL HIGH (ref 1.7–7.7)
Neutrophils Relative %: 70 %
Platelets: 346 10*3/uL (ref 150–400)
RBC: 4.83 MIL/uL (ref 4.22–5.81)
RDW: 14.2 % (ref 11.5–15.5)
WBC: 12.6 10*3/uL — ABNORMAL HIGH (ref 4.0–10.5)
nRBC: 0 % (ref 0.0–0.2)

## 2019-10-21 LAB — BASIC METABOLIC PANEL
Anion gap: 7 (ref 5–15)
BUN: 22 mg/dL (ref 8–23)
CO2: 29 mmol/L (ref 22–32)
Calcium: 8.5 mg/dL — ABNORMAL LOW (ref 8.9–10.3)
Chloride: 100 mmol/L (ref 98–111)
Creatinine, Ser: 0.8 mg/dL (ref 0.61–1.24)
GFR calc Af Amer: >60 mL/min (ref >60)
GFR calc non Af Amer: >60 mL/min (ref >60)
Glucose, Bld: 114 mg/dL — ABNORMAL HIGH (ref 70–99)
Potassium: 3.7 mmol/L (ref 3.5–5.1)
Sodium: 136 mmol/L (ref 135–145)

## 2019-10-21 MED ORDER — CEPHALEXIN 500 MG PO CAPS
500.0000 mg | ORAL_CAPSULE | Freq: Three times a day (TID) | ORAL | 0 refills | Status: DC
Start: 2019-10-21 — End: 2020-01-21

## 2019-10-21 MED ORDER — CEFTRIAXONE SODIUM 1 G IJ SOLR
1.0000 g | Freq: Once | INTRAMUSCULAR | Status: AC
  Administered 2019-10-21: 1 g via INTRAMUSCULAR
  Filled 2019-10-21: qty 10

## 2019-10-21 MED ORDER — LIDOCAINE HCL (PF) 1 % IJ SOLN
INTRAMUSCULAR | Status: AC
  Filled 2019-10-21: qty 30

## 2019-10-21 NOTE — Discharge Instructions (Addendum)
You were seen today for confusion and hallucinations.  You have evidence of urinary tract infection.  At your age, this can cause confusion and delirium.  Take antibiotics as prescribed.  If you develop fevers or worsening symptoms you should be reassessed.

## 2019-10-21 NOTE — ED Triage Notes (Signed)
EMS called out for hallucinations. EMS reports in route pt verbalized "thought they had hit person, 4 wheelers in road, trees down, flowers ect." pt alert and oriented x 4. Pt was started on doxycyline one week ago, pt developed rash and halllucinations about 3 days ago. Wife called doctor yesterday and was advised he thought it was rx to doxycycline and to DC medication and he would see pt on Monday.

## 2019-10-21 NOTE — ED Provider Notes (Signed)
[facility] EMERGENCY DEPARTMENT Provider Note   CSN: [id number] Arrival date & time: 10/21/19  0211     History Chief Complaint  Patient presents with  . Hallucinations    Gabriel Love is a 79 y.o. male.  HPI     This is an 79 year old male with a history of hypertension, hyperlipidemia, degenerative disc disease, recent history of ankle fracture who presents with confusion and hallucinations.  Patient reports that he feels fine.  He is technically oriented x4.  Does report that he was seeing things at home that he does not believe there.  His wife is at the bedside and states that he did not sleep hardly at all on Friday night.  She states that he was "talking out of his head."  Have not noted any fevers.  He is due to have surgery on his left ankle on Monday.  He had been started on some doxycycline prior to developing these symptoms and his primary doctor discontinued that thinking it may be related.  Wife has also noted a rash over his back.  No rash on the palms and soles.  No fevers.  No systemic symptoms.  Patient is without complaint.  Past Medical History:  Diagnosis Date  . Abdominal hernia   . Allergy   . Blood transfusion without reported diagnosis 12/2006   At [facility] with hip replacement - 2 units transfused  . Cancer ([place])    skin cancers  . Cataract   . Depression   . DJD (degenerative joint disease)    shoulders bilateral  . Environmental allergies   . Full dentures   . Hearing loss    bilateral hearing aids  . History of urinary tract infection   . Hyperlipidemia    under control  . Hypertension    "borderline"  . Nonspecific abnormal finding in stool contents   . Other general symptoms(780.99)   . Prostate hypertrophy    sees Dr. Gaynelle Arabian   . Urinary leakage     Patient Active Problem List   Diagnosis Date Noted  . Depression with anxiety 07/24/2019  . Morbid obesity ([place]) 04/04/2018  . Bilateral leg edema 02/07/2017  . Osteoarthritis  01/28/2017  . Urge and stress incontinence 01/28/2017  . History of colonic polyps   . Colon polyp 08/07/2013  . 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 Nonspecific abnormal finding in stool contents 05/29/2013  . Atherosclerotic peripheral vascular disease with intermittent claudication ([place]) 06/14/2012  . Spinal stenosis of lumbar region 06/14/2012  . 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 Hyperlipidemia 11/23/2006  . Essential hypertension 11/23/2006  . HEMATURIA 11/23/2006  . 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    Past Surgical History:  Procedure Laterality Date  . CATARACT EXTRACTION W/PHACO Right 03/25/2014   Procedure: CATARACT EXTRACTION PHACO AND INTRAOCULAR LENS PLACEMENT; CDE:9.76;  Surgeon: Williams Che, MD;  Location: [facility];  Service: Ophthalmology;  Laterality: Right;  . CATARACT EXTRACTION W/PHACO Left 06/24/2014   Procedure: CATARACT EXTRACTION PHACO AND INTRAOCULAR LENS PLACEMENT (IOC);  Surgeon: Williams Che, MD;  Location: [facility];  Service: Ophthalmology;  Laterality: Left;  CDE:8.45  . CIRCUMCISION N/A 03/26/2016   Procedure: CIRCUMCISION ADULT;  Surgeon: Carolan Clines, MD;  Location: [facility];  Service: Urology;  Laterality: N/A;  . CIRCUMCISION, NON-NEWBORN  1985  . COLONOSCOPY  06-19-13, 03/05/15   per Dr. Deatra Ina, mass at the ileocecal valve plus several polyps, pathology benign, repeat in one year, polyp  2016   . COLONOSCOPY WITH PROPOFOL N/A 07/29/2015   Procedure: COLONOSCOPY WITH PROPOFOL;  Surgeon: Manus Gunning, MD;  [facility];  Service: Gastroenterology;  Laterality: N/A;  . JOINT REPLACEMENT  12/2006   total left hip per Dr. Percell Miller   . LAPAROSCOPIC PARTIAL COLECTOMY N/A 08/07/2013   Procedure: LAPAROSCOPIC right PARTIAL COLECTOMY;  Surgeon: Leighton Ruff, MD;  Location: [facility];  Service: General;  Laterality: N/A;  . MULTIPLE TOOTH EXTRACTIONS    . PROSTATE SURGERY  03-29-11   had CTT  per Dr. Gaynelle Arabian, in office       Family History  Problem Relation Age of Onset  . Hypertension Mother   . Diabetes Mother   . Heart failure Mother        cad/mi with rupture ventricle  . Hyperlipidemia Father   . Colon polyps Father   . Cancer Neg Hx        colon or prostate  . Colon cancer Neg Hx   . Esophageal cancer Neg Hx   . Rectal cancer Neg Hx   . Stomach cancer Neg Hx     Social History   Tobacco Use  . Smoking status: Former Smoker    Packs/day: 1.00    Years: 5.00    Pack years: 5.00    Types: Cigarettes    Quit date: 03/22/1968    Years since quitting: 51.6  . Smokeless tobacco: Former Systems developer    Types: Chew    Quit date: 09/20/2015  . Tobacco comment: occassionally  Substance Use Topics  . Alcohol use: No    Alcohol/week: 0.0 standard drinks  . Drug use: No    Home Medications Prior to Admission medications   Medication Sig Start Date End Date Taking? Authorizing Provider  albuterol (VENTOLIN HFA) 108 (90 Base) MCG/ACT inhaler INHALE 2 PUFFS BY MOUTH EVERY 4 HOURS 10/15/19   Laurey Morale, MD  Ascorbic Acid (VITAMIN C PO) Take by mouth daily. Unsure of dosage.    [provider]  aspirin EC 81 MG tablet Take 81 mg by mouth daily.    [provider]  atorvastatin (LIPITOR) 40 MG tablet Take 1 tablet (40 mg total) by mouth daily. 02/19/19   Laurey Morale, MD  cephALEXin (KEFLEX) 500 MG capsule Take 1 capsule (500 mg total) by mouth 3 (three) times daily. 10/21/19   Venesha Petraitis, Barbette Hair, MD  clotrimazole-betamethasone (LOTRISONE) cream Apply 1 application topically daily. Applies to affected area. 02/10/15   [provider]  furosemide (LASIX) 40 MG tablet TAKE 1 TABLET(40 MG) BY MOUTH TWICE DAILY 04/16/19   Laurey Morale, MD  metoprolol succinate (TOPROL-XL) 50 MG 24 hr tablet TAKE 1 TABLET BY MOUTH ONCE DAILY WITH OR IMMEDIATELY FOLLOWING A MEAL 02/19/19   Laurey Morale, MD  Multiple Vitamins-Minerals (CENTRUM ADULTS PO) Take by  mouth.    [provider]  mupirocin ointment (BACTROBAN) 2 % Apply topically 2 (two) times daily. 12/20/18   Laurey Morale, MD  MYRBETRIQ 50 MG TB24 tablet Take 50 mg by mouth daily.  08/25/18   [provider]  oxyCODONE (ROXICODONE) 5 MG immediate release tablet Take 1 tablet (5 mg total) by mouth every 4 (four) hours as needed for severe pain. 10/10/19   Maudie Flakes, MD  potassium chloride SA (KLOR-CON) 20 MEQ tablet Take 1 tablet (20 mEq total) by mouth daily. 05/15/19   Laurey Morale, MD  traMADol-acetaminophen (ULTRACET) 37.5-325 MG tablet Take 1 tablet  by mouth every 6 (six) hours as needed. 03/26/16   Carolan Clines, MD  venlafaxine XR (EFFEXOR XR) 150 MG 24 hr capsule Take 1 capsule (150 mg total) by mouth daily with breakfast. 07/24/19   Laurey Morale, MD    Allergies    Ivp dye [iodinated diagnostic agents], Lisinopril, and Penicillins  Review of Systems   Review of Systems  Constitutional: Negative for fever.  Respiratory: Negative for shortness of breath.   Cardiovascular: Negative for chest pain.  Gastrointestinal: Negative for abdominal pain, nausea and vomiting.  Genitourinary: Negative for dysuria.  Musculoskeletal:       Left ankle fracture  Neurological: Negative for weakness and numbness.  Psychiatric/Behavioral: Positive for confusion and hallucinations.  All other systems reviewed and are negative.   Physical Exam Updated Vital Signs BP (!) 136/71   Pulse 85   Temp 98.4 F (36.9 C) (Oral)   Resp 20   Ht 1.803 m (5\' 11" )   Wt (!) 150.6 kg   SpO2 95%   BMI 46.30 kg/m   Physical Exam Vitals and nursing note reviewed.  Constitutional:      Appearance: He is well-developed. He is obese. He is not ill-appearing.  HENT:     Head: Normocephalic and atraumatic.     Mouth/Throat:     Mouth: Mucous membranes are moist.  Eyes:     Pupils: Pupils are equal, round, and reactive to light.  Cardiovascular:     Rate and Rhythm: Normal rate  and regular rhythm.     Heart sounds: Normal heart sounds. No murmur heard.   Pulmonary:     Effort: Pulmonary effort is normal. No respiratory distress.     Breath sounds: Normal breath sounds. No wheezing.  Abdominal:     General: Bowel sounds are normal.     Palpations: Abdomen is soft.     Tenderness: There is no abdominal tenderness. There is no rebound.     Hernia: A hernia is present.  Musculoskeletal:     Cervical back: Neck supple.     Comments: Left lower extremity splinted, good perfusion of the toes  Lymphadenopathy:     Cervical: No cervical adenopathy.  Skin:    General: Skin is warm and dry.     Comments: Papular rash noted just over the back, occasional pustule noted  Neurological:     Mental Status: He is alert and oriented to person, place, and time.     Comments: 5 out of 5 strength in all 4 extremities, no dysmetria to finger-nose-finger  Psychiatric:        Mood and Affect: Mood normal.     ED Results / Procedures / Treatments   Labs (all labs ordered are listed, but only abnormal results are displayed) Labs Reviewed  CBC WITH DIFFERENTIAL/PLATELET - Abnormal; Notable for the following components:      Result Value   WBC 12.6 (*)    Neutro Abs 8.8 (*)    Monocytes Absolute 1.1 (*)    Abs Immature Granulocytes 0.10 (*)    All other components within normal limits  BASIC METABOLIC PANEL - Abnormal; Notable for the following components:   Glucose, Bld 114 (*)    Calcium 8.5 (*)    All other components within normal limits  URINALYSIS, ROUTINE W REFLEX MICROSCOPIC - Abnormal; Notable for the following components:   APPearance CLOUDY (*)    Nitrite POSITIVE (*)    Leukocytes,Ua LARGE (*)    All other components within  normal limits  URINE CULTURE    EKG EKG Interpretation  Date/Time:  "Sunday October 21 2019 02:22:21 EDT Ventricular Rate:  85 PR Interval:    QRS Duration: 86 QT Interval:  368 QTC Calculation: 438 R Axis:   50 Text  Interpretation: Sinus rhythm Low voltage, precordial leads Confirmed by Jhoselyn Ruffini ([id number]) on 10/21/2019 2:26:50 AM   Radiology No results found.  Procedures Procedures (including critical care time)  Medications Ordered in ED Medications  cefTRIAXone (ROCEPHIN) injection 1 g (has no administration in time range)    ED Course  I have reviewed the triage vital signs and the nursing notes.  Pertinent labs & imaging results that were available during my care of the patient were reviewed by me and considered in my medical decision making (see chart for details).    MDM Rules/Calculators/A&P                           Patient presents with hallucinations and confusion.  Overall nontoxic and vital signs are reassuring.  He is afebrile.  He is oriented x4 and able to provide history.  He has no physical complaints and is due to have surgery on his left ankle.  Wife is concerned as he stayed up all last night and appeared to be hallucinating.  Will screen for common causes of delirium in his age including infectious source or metabolic derangement.  CBC with mild leukocytosis.  No significant metabolic derangements.  Urinalysis is nitrite positive with large leukocytes and 21-50 white cells.  Urine culture was sent.  Feel this is likely the source of the patient's delirium.  He is nontoxic and nonseptic appearing.  For this reason, feel he is reasonable for treatment at home.  After history, exam, and medical workup I feel the patient has been appropriately medically screened and is safe for discharge home. Pertinent diagnoses were discussed with the patient. Patient was given return precautions.   Final Clinical Impression(s) / ED Diagnoses Final diagnoses:  Delirium  Acute cystitis without hematuria    Rx / DC Orders ED Discharge Orders         Ordered    cephALEXin (KEFLEX) 500 MG capsule  3 times daily     Discontinue  Reprint     08" /01/21 0448           Millette Halberstam, Barbette Hair, MD 10/21/19 [phone]

## 2019-10-22 DIAGNOSIS — S8265XD Nondisplaced fracture of lateral malleolus of left fibula, subsequent encounter for closed fracture with routine healing: Secondary | ICD-10-CM | POA: Diagnosis not present

## 2019-10-22 LAB — URINE CULTURE

## 2019-10-30 ENCOUNTER — Ambulatory Visit: Payer: Medicare PPO | Admitting: Orthopedic Surgery

## 2019-10-30 ENCOUNTER — Encounter: Payer: Self-pay | Admitting: Orthopedic Surgery

## 2019-10-30 VITALS — Ht 71.0 in | Wt 332.0 lb

## 2019-10-30 DIAGNOSIS — L97221 Non-pressure chronic ulcer of left calf limited to breakdown of skin: Secondary | ICD-10-CM

## 2019-10-30 DIAGNOSIS — S8265XA Nondisplaced fracture of lateral malleolus of left fibula, initial encounter for closed fracture: Secondary | ICD-10-CM | POA: Diagnosis not present

## 2019-10-30 NOTE — Progress Notes (Signed)
Office Visit Note   Patient: Gabriel Love           Date of Birth: 1940-08-07           MRN: [id number] Visit Date: 10/30/2019              Requested by: Laurey Morale, MD [place],  [place] [address] PCP: Laurey Morale, MD  Chief Complaint  Patient presents with  . Left Ankle - Pain, New Patient (Initial Visit)      HPI: Patient is an 79 year old gentleman who stepped off his tractor rolled his ankle he denies any initial pain was unable to stand up on his own and had to call the fire department to get him off the ground.  Injury occurred on 10/10/2019 and patient is approximately 3 weeks out from his injury.  He has been wrapped in a posterior splint well-padded.  Patient is seen in consultation from Dr. French Ana.  Patient is completing a course of Keflex which she is taking for a recent kidney infection.  Patient does have a history of tobacco use.  Patient denies a personal history of diabetes but states he does have a family history of diabetes.  Assessment & Plan: Visit Diagnoses:  1. Closed nondisplaced fracture of lateral malleolus of left fibula, initial encounter   2. Skin ulcer of left calf, limited to breakdown of skin ([place])     Plan: Discussed with the patient and wife treatment options including open reduction internal fixation of the fibula and syndesmosis versus closed treatment.  With patient's peripheral vascular disease venous stasis ulcers and significant swelling patient is at increased risk with surgical intervention.  I have recommended proceeding with compression wraps to help decrease the swelling and heal the fracture blisters a cam boot with partial weightbearing with crutches follow-up weekly for compression wrap changes and transition to a compression sock when the fracture is stable.  Follow-Up Instructions: Return in about 1 week (around 11/06/2019).   Ortho Exam  Patient is alert, oriented, no adenopathy, well-dressed, normal  affect, normal respiratory effort. Examination patient has significant swelling to the left lower extremity there are fracture blisters anteriorly over the fibula tibia and over the medial malleolus with a superficial ischemic ulcer over the medial malleolus.  Patient does not have palpable pulses.  The Doppler was used and he has a dampened biphasic dorsalis pedis and dampened biphasic posterior tibial pulse.  Patient's ankle is clinically and valgus.  Review of the radiographs shows a nondisplaced Weber B fibular fracture with approximately 3 mm of widening of the medial joint space consistent also with a syndesmotic injury.  Imaging: No results found. No images are attached to the encounter.  Labs: Lab Results  Component Value Date   HGBA1C 5.5 02/19/2019   HGBA1C 5.6 02/28/2015   HGBA1C 5.8 07/04/2012   ESRSEDRATE 14 06/06/2013   REPTSTATUS 10/22/2019 FINAL 10/21/2019   CULT MULTIPLE SPECIES PRESENT, SUGGEST RECOLLECTION (A) 10/21/2019   LABORGA ESCHERICHIA COLI 12/24/2012     Lab Results  Component Value Date   ALBUMIN 3.7 02/19/2019   ALBUMIN 4.0 02/14/2017   ALBUMIN 4.3 02/26/2016    No results found for: MG No results found for: VD25OH  No results found for: PREALBUMIN CBC EXTENDED Latest Ref Rng & Units 10/21/2019 02/19/2019 04/04/2018  WBC 4.0 - 10.5 K/uL 12.6(H) 10.4 9.4  RBC 4.22 - 5.81 MIL/uL 4.83 5.18 5.33  HGB 13.0 - 17.0 g/dL 14.3 15.2 15.6  HCT 39 - 52 % 44.5 46.3 47.0  PLT 150 - 400 K/uL 346 199.0 195.0  NEUTROABS 1.7 - 7.7 K/uL 8.8(H) 7.4 6.6  LYMPHSABS 0.7 - 4.0 K/uL 2.2 2.0 1.8     Body mass index is 46.3 kg/m.  Orders:  No orders of the defined types were placed in this encounter.  No orders of the defined types were placed in this encounter.    Procedures: No procedures performed  Clinical Data: No additional findings.  ROS:  All other systems negative, except as noted in the HPI. Review of Systems  Objective: Vital Signs: Ht 5\' 11"   (1.803 m)   Wt (!) 332 lb (150.6 kg)   BMI 46.30 kg/m   Specialty Comments:  No specialty comments available.  PMFS History: Patient Active Problem List   Diagnosis Date Noted  . Depression with anxiety 07/24/2019  . Morbid obesity ([place]) 04/04/2018  . Bilateral leg edema 02/07/2017  . Osteoarthritis 01/28/2017  . Urge and stress incontinence 01/28/2017  . History of colonic polyps   . Colon polyp 08/07/2013  . 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 Nonspecific abnormal finding in stool contents 05/29/2013  . Atherosclerotic peripheral vascular disease with intermittent claudication ([place]) 06/14/2012  . Spinal stenosis of lumbar region 06/14/2012  . 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 Hyperlipidemia 11/23/2006  . Essential hypertension 11/23/2006  . HEMATURIA 11/23/2006  . 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   Past Medical History:  Diagnosis Date  . Abdominal hernia   . Allergy   . Blood transfusion without reported diagnosis 12/2006   At [facility] with hip replacement - 2 units transfused  . Cancer ([place])    skin cancers  . Cataract   . Depression   . DJD (degenerative joint disease)    shoulders bilateral  . Environmental allergies   . Full dentures   . Hearing loss    bilateral hearing aids  . History of urinary tract infection   . Hyperlipidemia    under control  . Hypertension    "borderline"  . Nonspecific abnormal finding in stool contents   . Other general symptoms(780.99)   . Prostate hypertrophy    sees Dr. Gaynelle Arabian   . Urinary leakage     Family History  Problem Relation Age of Onset  . Hypertension Mother   . Diabetes Mother   . Heart failure Mother        cad/mi with rupture ventricle  . Hyperlipidemia Father   . Colon polyps Father   . Cancer Neg Hx        colon or prostate  . Colon cancer Neg Hx   . Esophageal cancer Neg Hx   . Rectal cancer Neg Hx   . Stomach cancer Neg Hx     Past  Surgical History:  Procedure Laterality Date  . CATARACT EXTRACTION W/PHACO Right 03/25/2014   Procedure: CATARACT EXTRACTION PHACO AND INTRAOCULAR LENS PLACEMENT; CDE:9.76;  Surgeon: Williams Che, MD;  Location: [facility];  Service: Ophthalmology;  Laterality: Right;  . CATARACT EXTRACTION W/PHACO Left 06/24/2014   Procedure: CATARACT EXTRACTION PHACO AND INTRAOCULAR LENS PLACEMENT (IOC);  Surgeon: Williams Che, MD;  Location: [facility];  Service: Ophthalmology;  Laterality: Left;  CDE:8.45  . CIRCUMCISION N/A 03/26/2016   Procedure: CIRCUMCISION ADULT;  Surgeon: Carolan Clines, MD;  Location: [facility];  Service: Urology;  Laterality: N/A;  . CIRCUMCISION, NON-NEWBORN  1985  . COLONOSCOPY  06-19-13, 03/05/15  per Dr. Deatra Ina, mass at the ileocecal valve plus several polyps, pathology benign, repeat in one year, polyp 2016   . COLONOSCOPY WITH PROPOFOL N/A 07/29/2015   Procedure: COLONOSCOPY WITH PROPOFOL;  Surgeon: Manus Gunning, MD;  [facility];  Service: Gastroenterology;  Laterality: N/A;  . JOINT REPLACEMENT  12/2006   total left hip per Dr. Percell Miller   . LAPAROSCOPIC PARTIAL COLECTOMY N/A 08/07/2013   Procedure: LAPAROSCOPIC right PARTIAL COLECTOMY;  Surgeon: Leighton Ruff, MD;  [facility]: [facility];  Service: General;  Laterality: N/A;  . MULTIPLE TOOTH EXTRACTIONS    . PROSTATE SURGERY  03-29-11   had CTT per Dr. Gaynelle Arabian, in office   Social History   Occupational History  . Occupation: Retired  Tobacco Use  . Smoking status: Former Smoker    Packs/day: 1.00    Years: 5.00    Pack years: 5.00    Types: Cigarettes    Quit date: 03/22/1968    Years since quitting: 51.6  . Smokeless tobacco: Former Systems developer    Types: Chew    Quit date: 09/20/2015  . Tobacco comment: occassionally  Substance and Sexual Activity  . Alcohol use: No    Alcohol/week: 0.0 standard drinks  . Drug use: No  . Sexual activity: Not on file

## 2019-11-12 ENCOUNTER — Encounter: Payer: Self-pay | Admitting: Orthopedic Surgery

## 2019-11-12 ENCOUNTER — Ambulatory Visit: Payer: Medicare PPO | Admitting: Physician Assistant

## 2019-11-12 VITALS — Ht 71.0 in | Wt 332.0 lb

## 2019-11-12 DIAGNOSIS — S8265XA Nondisplaced fracture of lateral malleolus of left fibula, initial encounter for closed fracture: Secondary | ICD-10-CM | POA: Diagnosis not present

## 2019-11-12 NOTE — Progress Notes (Signed)
Office Visit Note   Patient: Gabriel Love           Date of Birth: Jul 24, 1940           MRN: [id number] Visit Date: 11/12/2019              Requested by: Laurey Morale, MD [place],  [place] [address] PCP: Laurey Morale, MD  Chief Complaint  Patient presents with  . Left Ankle - Follow-up    10/10/19 closed nondisplaced fx lateral malleolus left fib       HPI: This is a pleasant gentleman who follows up today for his left distal fibula fracture.  He injured this approximately 1 month ago.  He first presented to Dr. Sharol Given last week.  He had significant fracture blisters and swelling.  It was recommended that he do Profore dressing changes weekly until swelling was down enough that he can wear a compression sock.  Was provided a boot for partial weightbearing patient is anxious to begin weightbearing again  Assessment & Plan: Visit Diagnoses: No diagnosis found.  Plan: We will place him in another Profore dressing for 1 week.  He should obtain x-rays of his ankle at his next visit.  I am hopeful that he could transition into a compression stocking and as he will be over 6 weeks out from the injury depending on radiographs would be allowed to put more weight on his ankle in the boot  Follow-Up Instructions: No follow-ups on file.   Ortho Exam  Patient is alert, oriented, no adenopathy, well-dressed, normal affect, normal respiratory effort. Focused examination demonstrates area where dressing had slid down a bit but overall swelling is decreased.  Skin is improving.  Blisters have dried up on the medial and anterior aspect of his lower leg minimal drainage on the blister on the lateral side of his leg.  No deep ulcers no cellulitis no concerns today for any infection  Imaging: No results found. No images are attached to the encounter.  Labs: Lab Results  Component Value Date   HGBA1C 5.5 02/19/2019   HGBA1C 5.6 02/28/2015   HGBA1C 5.8 07/04/2012    ESRSEDRATE 14 06/06/2013   REPTSTATUS 10/22/2019 FINAL 10/21/2019   CULT MULTIPLE SPECIES PRESENT, SUGGEST RECOLLECTION (A) 10/21/2019   LABORGA ESCHERICHIA COLI 12/24/2012     Lab Results  Component Value Date   ALBUMIN 3.7 02/19/2019   ALBUMIN 4.0 02/14/2017   ALBUMIN 4.3 02/26/2016    No results found for: MG No results found for: VD25OH  No results found for: PREALBUMIN CBC EXTENDED Latest Ref Rng & Units 10/21/2019 02/19/2019 04/04/2018  WBC 4.0 - 10.5 K/uL 12.6(H) 10.4 9.4  RBC 4.22 - 5.81 MIL/uL 4.83 5.18 5.33  HGB 13.0 - 17.0 g/dL 14.3 15.2 15.6  HCT 39 - 52 % 44.5 46.3 47.0  PLT 150 - 400 K/uL 346 199.0 195.0  NEUTROABS 1.7 - 7.7 K/uL 8.8(H) 7.4 6.6  LYMPHSABS 0.7 - 4.0 K/uL 2.2 2.0 1.8     Body mass index is 46.3 kg/m.  Orders:  No orders of the defined types were placed in this encounter.  No orders of the defined types were placed in this encounter.    Procedures: No procedures performed  Clinical Data: No additional findings.  ROS:  All other systems negative, except as noted in the HPI. Review of Systems  Objective: Vital Signs: Ht 5\' 11"  (1.803 m)   Wt (!) 332 lb (150.6 kg)  BMI 46.30 kg/m   Specialty Comments:  No specialty comments available.  PMFS History: Patient Active Problem List   Diagnosis Date Noted  . Depression with anxiety 07/24/2019  . Morbid obesity ([place]) 04/04/2018  . Bilateral leg edema 02/07/2017  . Osteoarthritis 01/28/2017  . Urge and stress incontinence 01/28/2017  . History of colonic polyps   . Colon polyp 08/07/2013  . 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 Nonspecific abnormal finding in stool contents 05/29/2013  . Atherosclerotic peripheral vascular disease with intermittent claudication ([place]) 06/14/2012  . Spinal stenosis of lumbar region 06/14/2012  . 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 Hyperlipidemia 11/23/2006  . Essential hypertension 11/23/2006  .  HEMATURIA 11/23/2006  . 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   Past Medical History:  Diagnosis Date  . Abdominal hernia   . Allergy   . Blood transfusion without reported diagnosis 12/2006   At [facility] with hip replacement - 2 units transfused  . Cancer ([place])    skin cancers  . Cataract   . Depression   . DJD (degenerative joint disease)    shoulders bilateral  . Environmental allergies   . Full dentures   . Hearing loss    bilateral hearing aids  . History of urinary tract infection   . Hyperlipidemia    under control  . Hypertension    "borderline"  . Nonspecific abnormal finding in stool contents   . Other general symptoms(780.99)   . Prostate hypertrophy    sees Dr. Gaynelle Arabian   . Urinary leakage     Family History  Problem Relation Age of Onset  . Hypertension Mother   . Diabetes Mother   . Heart failure Mother        cad/mi with rupture ventricle  . Hyperlipidemia Father   . Colon polyps Father   . Cancer Neg Hx        colon or prostate  . Colon cancer Neg Hx   . Esophageal cancer Neg Hx   . Rectal cancer Neg Hx   . Stomach cancer Neg Hx     Past Surgical History:  Procedure Laterality Date  . CATARACT EXTRACTION W/PHACO Right 03/25/2014   Procedure: CATARACT EXTRACTION PHACO AND INTRAOCULAR LENS PLACEMENT; CDE:9.76;  Surgeon: Williams Che, MD;  [facility]: [facility];  Service: Ophthalmology;  Laterality: Right;  . CATARACT EXTRACTION W/PHACO Left 06/24/2014   Procedure: CATARACT EXTRACTION PHACO AND INTRAOCULAR LENS PLACEMENT (IOC);  Surgeon: Williams Che, MD;  Location: [facility];  Service: Ophthalmology;  Laterality: Left;  CDE:8.45  . CIRCUMCISION N/A 03/26/2016   Procedure: CIRCUMCISION ADULT;  Surgeon: Carolan Clines, MD;  [facility];  Service: Urology;  Laterality: N/A;  . CIRCUMCISION, NON-NEWBORN  1985  . COLONOSCOPY  06-19-13, 03/05/15   per Dr. Deatra Ina, mass at the ileocecal valve plus several polyps, pathology benign, repeat in one year,  polyp 2016   . COLONOSCOPY WITH PROPOFOL N/A 07/29/2015   Procedure: COLONOSCOPY WITH PROPOFOL;  Surgeon: Manus Gunning, MD;  [facility];  Service: Gastroenterology;  Laterality: N/A;  . JOINT REPLACEMENT  12/2006   total left hip per Dr. Percell Miller   . LAPAROSCOPIC PARTIAL COLECTOMY N/A 08/07/2013   Procedure: LAPAROSCOPIC right PARTIAL COLECTOMY;  Surgeon: Leighton Ruff, MD;  [facility];  Service: General;  Laterality: N/A;  . MULTIPLE TOOTH EXTRACTIONS    . PROSTATE SURGERY  03-29-11   had CTT per Dr. Gaynelle Arabian, in office   Social History  Occupational History  . Occupation: Retired  Tobacco Use  . Smoking status: Former Smoker    Packs/day: 1.00    Years: 5.00    Pack years: 5.00    Types: Cigarettes    Quit date: 03/22/1968    Years since quitting: 51.6  . Smokeless tobacco: Former Systems developer    Types: Chew    Quit date: 09/20/2015  . Tobacco comment: occassionally  Substance and Sexual Activity  . Alcohol use: No    Alcohol/week: 0.0 standard drinks  . Drug use: No  . Sexual activity: Not on file

## 2019-11-19 ENCOUNTER — Ambulatory Visit: Payer: Medicare PPO | Admitting: Orthopedic Surgery

## 2019-11-19 ENCOUNTER — Telehealth: Payer: Self-pay | Admitting: Orthopedic Surgery

## 2019-11-19 ENCOUNTER — Ambulatory Visit: Payer: Self-pay

## 2019-11-19 ENCOUNTER — Encounter: Payer: Self-pay | Admitting: Physician Assistant

## 2019-11-19 VITALS — Ht 71.0 in | Wt 332.0 lb

## 2019-11-19 DIAGNOSIS — S8265XA Nondisplaced fracture of lateral malleolus of left fibula, initial encounter for closed fracture: Secondary | ICD-10-CM | POA: Diagnosis not present

## 2019-11-19 DIAGNOSIS — L97221 Non-pressure chronic ulcer of left calf limited to breakdown of skin: Secondary | ICD-10-CM | POA: Diagnosis not present

## 2019-11-19 NOTE — Telephone Encounter (Signed)
Dr. Sharol Given called and sw pt's wife to advise of instructions and plan.

## 2019-11-19 NOTE — Progress Notes (Signed)
Office Visit Note   Patient: Gabriel Love           Date of Birth: [birth year]           MRN: [id number] Visit Date: 11/19/2019              Requested by: Laurey Morale, MD [place],  [place] [address] PCP: Laurey Morale, MD  Chief Complaint  Patient presents with  . Left Ankle - Follow-up    Left distal fibula fracture 10/10/19      HPI: Patient is an 79 year old gentleman who presents 6 weeks status post closed displaced Weber B fracture with syndesmotic disruption.  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  The ischemic ulcers were directly over the area that required surgical intervention.  Patient presents at this time in follow-up.  Assessment & Plan: Visit Diagnoses:  1. Closed nondisplaced fracture of lateral malleolus of left fibula, initial encounter   2. Skin ulcer of left calf, limited to breakdown of skin ([facility])     Plan: The skin is now wrinkling well the lateral ulcers have completely healed however patient still has 1 ischemic ulcer over the fibula we will place him in a double extra-large knee-high medical compression stocking he may be weightbearing as tolerated in the fracture boot around the house wear the sock around-the-clock reevaluate in 2 weeks.  Repeat three-view radiographs of the left ankle at follow-up.  I discussed with his wife if there is any changes in the skin or swelling or alignment of his ankle to follow-up immediately.  Again reviewed the risk of surgical intervention and potential for transtibial amputation.  Follow-Up Instructions: Return in about 2 weeks (around 12/03/2019).   Ortho Exam  Patient is alert, oriented, no adenopathy, well-dressed, normal affect, normal respiratory effort. Examination patient's black ischemic ulcers dorsally and medially over the ankle have healed.  The skin is now wrinkling patient has 1 remaining ischemic  ulcer and this is directly over the fibula.  Radiographs show shortening of the fibula with widening of the syndesmosis and progressive valgus displacement of the ankle joint with widening of the medial joint line.  Patient's calf is 47 cm in circumference.  Imaging: No results found. No images are attached to the encounter.  Labs: Lab Results  Component Value Date   HGBA1C 5.5 02/19/2019   HGBA1C 5.6 02/28/2015   HGBA1C 5.8 07/04/2012   ESRSEDRATE 14 06/06/2013   REPTSTATUS 10/22/2019 FINAL 10/21/2019   CULT MULTIPLE SPECIES PRESENT, SUGGEST RECOLLECTION (A) 10/21/2019   LABORGA ESCHERICHIA COLI 12/24/2012     Lab Results  Component Value Date   ALBUMIN 3.7 02/19/2019   ALBUMIN 4.0 02/14/2017   ALBUMIN 4.3 02/26/2016    No results found for: MG No results found for: VD25OH  No results found for: PREALBUMIN CBC EXTENDED Latest Ref Rng & Units 10/21/2019 02/19/2019 04/04/2018  WBC 4.0 - 10.5 K/uL 12.6(H) 10.4 9.4  RBC 4.22 - 5.81 MIL/uL 4.83 5.18 5.33  HGB 13.0 - 17.0 g/dL 14.3 15.2 15.6  HCT 39 - 52 % 44.5 46.3 47.0  PLT 150 - 400 K/uL 346 199.0 195.0  NEUTROABS 1.7 - 7.7 K/uL 8.8(H) 7.4 6.6  LYMPHSABS 0.7 - 4.0 K/uL 2.2 2.0 1.8     Body mass index is 46.3 kg/m.  Orders:  Orders Placed This Encounter  Procedures  . XR Ankle 2 Views Left   No orders  of the defined types were placed in this encounter.    Procedures: No procedures performed  Clinical Data: No additional findings.  ROS:  All other systems negative, except as noted in the HPI. Review of Systems  Objective: Vital Signs: Ht 5\' 11"  (1.803 m)   Wt (!) 332 lb (150.6 kg)   BMI 46.30 kg/m   Specialty Comments:  No specialty comments available.  PMFS History: Patient Active Problem List   Diagnosis Date Noted  . Depression with anxiety 07/24/2019  . Morbid obesity ([place]) 04/04/2018  . Bilateral leg edema 02/07/2017  . Osteoarthritis 01/28/2017  . Urge and stress incontinence 01/28/2017    . History of colonic polyps   . Colon polyp 08/07/2013  . 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 Nonspecific abnormal finding in stool contents 05/29/2013  . Atherosclerotic peripheral vascular disease with intermittent claudication ([place]) 06/14/2012  . Spinal stenosis of lumbar region 06/14/2012  . 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 Hyperlipidemia 11/23/2006  . Essential hypertension 11/23/2006  . HEMATURIA 11/23/2006  . 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   Past Medical History:  Diagnosis Date  . Abdominal hernia   . Allergy   . Blood transfusion without reported diagnosis 12/2006   At [facility] with hip replacement - 2 units transfused  . Cancer ([place])    skin cancers  . Cataract   . Depression   . DJD (degenerative joint disease)    shoulders bilateral  . Environmental allergies   . Full dentures   . Hearing loss    bilateral hearing aids  . History of urinary tract infection   . Hyperlipidemia    under control  . Hypertension    "borderline"  . Nonspecific abnormal finding in stool contents   . Other general symptoms(780.99)   . Prostate hypertrophy    sees Dr. Gaynelle Arabian   . Urinary leakage     Family History  Problem Relation Age of Onset  . Hypertension Mother   . Diabetes Mother   . Heart failure Mother        cad/mi with rupture ventricle  . Hyperlipidemia Father   . Colon polyps Father   . Cancer Neg Hx        colon or prostate  . Colon cancer Neg Hx   . Esophageal cancer Neg Hx   . Rectal cancer Neg Hx   . Stomach cancer Neg Hx     Past Surgical History:  Procedure Laterality Date  . CATARACT EXTRACTION W/PHACO Right 03/25/2014   Procedure: CATARACT EXTRACTION PHACO AND INTRAOCULAR LENS PLACEMENT; CDE:9.76;  Surgeon: Williams Che, MD;  Location: [facility];  Service: Ophthalmology;  Laterality: Right;  . CATARACT EXTRACTION W/PHACO Left 06/24/2014   Procedure: CATARACT EXTRACTION PHACO AND INTRAOCULAR  LENS PLACEMENT (IOC);  Surgeon: Williams Che, MD;  Location: [facility];  Service: Ophthalmology;  Laterality: Left;  CDE:8.45  . CIRCUMCISION N/A 03/26/2016   Procedure: CIRCUMCISION ADULT;  Surgeon: Carolan Clines, MD;  [facility];  Service: Urology;  Laterality: N/A;  . CIRCUMCISION, NON-NEWBORN  1985  . COLONOSCOPY  06-19-13, 03/05/15   per Dr. Deatra Ina, mass at the ileocecal valve plus several polyps, pathology benign, repeat in one year, polyp 2016   . COLONOSCOPY WITH PROPOFOL N/A 07/29/2015   Procedure: COLONOSCOPY WITH PROPOFOL;  Surgeon: Manus Gunning, MD;  [facility];  Service: Gastroenterology;  Laterality: N/A;  . JOINT REPLACEMENT  12/2006   total left hip per  Dr. Percell Miller   . LAPAROSCOPIC PARTIAL COLECTOMY N/A 08/07/2013   Procedure: LAPAROSCOPIC right PARTIAL COLECTOMY;  Surgeon: Leighton Ruff, MD;  [facility]: [facility];  Service: General;  Laterality: N/A;  . MULTIPLE TOOTH EXTRACTIONS    . PROSTATE SURGERY  03-29-11   had CTT per Dr. Gaynelle Arabian, in office   Social History   Occupational History  . Occupation: Retired  Tobacco Use  . Smoking status: Former Smoker    Packs/day: 1.00    Years: 5.00    Pack years: 5.00    Types: Cigarettes    Quit date: 03/22/1968    Years since quitting: 51.6  . Smokeless tobacco: Former Systems developer    Types: Chew    Quit date: 09/20/2015  . Tobacco comment: occassionally  Substance and Sexual Activity  . Alcohol use: No    Alcohol/week: 0.0 standard drinks  . Drug use: No  . Sexual activity: Not on file

## 2019-11-19 NOTE — Telephone Encounter (Signed)
Patient's wife  Izora Gala upset that she was told that she would be called by Dr Sharol Given or his nurse during the visit with Gabriel Love, because she was not allowed back. Izora Gala did not get the care instruction and request a call to explain what need to be done correctly.

## 2019-11-21 ENCOUNTER — Telehealth: Payer: Self-pay | Admitting: Orthopedic Surgery

## 2019-11-21 NOTE — Telephone Encounter (Signed)
Patient's wife called. She would like to know if the compression stocking is suppose to be painful. Says patient has been in pain since putting the stocking on. She would like a call back. [phone]

## 2019-11-21 NOTE — Telephone Encounter (Signed)
I called and sw pt and he has not been elevating his feet. Advised while wearing the compression sock he should elevate his feet higher than heart to help with swelling.

## 2019-12-04 ENCOUNTER — Encounter: Payer: Self-pay | Admitting: Orthopedic Surgery

## 2019-12-04 ENCOUNTER — Ambulatory Visit: Payer: Medicare PPO | Admitting: Physician Assistant

## 2019-12-04 ENCOUNTER — Ambulatory Visit: Payer: Self-pay

## 2019-12-04 VITALS — Ht 71.0 in | Wt 332.0 lb

## 2019-12-04 DIAGNOSIS — S8265XA Nondisplaced fracture of lateral malleolus of left fibula, initial encounter for closed fracture: Secondary | ICD-10-CM

## 2019-12-04 NOTE — Progress Notes (Signed)
Office Visit Note   Patient: Gabriel Love           Date of Birth: [birth year]           MRN: [id number] Visit Date: 12/04/2019              Requested by: Laurey Morale, MD [place],  [place] [id number] PCP: Laurey Morale, MD  Chief Complaint  Patient presents with  . Left Leg - Follow-up    10/10/19 left distal fib fx       HPI: This is a pleasant gentleman who is now 6 weeks status post left distal fibula fracture with syndesmosis disruption.  Because of his significant comorbidities we have been treating this conservatively.  He has been wearing a vive compression stocking and Cam walker boot  Assessment & Plan: Visit Diagnoses:  1. Closed nondisplaced fracture of lateral malleolus of left fibula, initial encounter     Plan: May weight-bear as tolerated in the cam walker boot follow-up in 1 month with repeat ankle x-rays continue to wearing his sock.  Follow-Up Instructions: No follow-ups on file.   Ortho Exam  Patient is alert, oriented, no adenopathy, well-dressed, normal affect, normal respiratory effort. Left ankle skin is in good condition he does have some thin layer of eschar which was removed to reveal healthy skin beneath.  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  He does have some deformity of the ankle secondary to the injury swelling is well controlled no cellulitis  Imaging: No results found. No images are attached to the encounter.  Labs: Lab Results  Component Value Date   HGBA1C 5.5 02/19/2019   HGBA1C 5.6 02/28/2015   HGBA1C 5.8 07/04/2012   ESRSEDRATE 14 06/06/2013   REPTSTATUS 10/22/2019 FINAL 10/21/2019   CULT MULTIPLE SPECIES PRESENT, SUGGEST RECOLLECTION (A) 10/21/2019   LABORGA ESCHERICHIA COLI 12/24/2012     Lab Results  Component Value Date   ALBUMIN 3.7 02/19/2019   ALBUMIN 4.0 02/14/2017   ALBUMIN 4.3 02/26/2016    No results found for: MG No results found for:  VD25OH  No results found for: PREALBUMIN CBC EXTENDED Latest Ref Rng & Units 10/21/2019 02/19/2019 04/04/2018  WBC 4.0 - 10.5 K/uL 12.6(H) 10.4 9.4  RBC 4.22 - 5.81 MIL/uL 4.83 5.18 5.33  HGB 13.0 - 17.0 g/dL 14.3 15.2 15.6  HCT 39 - 52 % 44.5 46.3 47.0  PLT 150 - 400 K/uL 346 199.0 195.0  NEUTROABS 1.7 - 7.7 K/uL 8.8(H) 7.4 6.6  LYMPHSABS 0.7 - 4.0 K/uL 2.2 2.0 1.8     Body mass index is 46.3 kg/m.  Orders:  Orders Placed This Encounter  Procedures  . XR Ankle Complete Left   No orders of the defined types were placed in this encounter.    Procedures: No procedures performed  Clinical Data: No additional findings.  ROS:  All other systems negative, except as noted in the HPI. Review of Systems  Objective: Vital Signs: Ht 5\' 11"  (1.803 m)   Wt (!) 332 lb (150.6 kg)   BMI 46.30 kg/m   Specialty Comments:  No specialty comments available.  PMFS History: Patient Active Problem List   Diagnosis Date Noted  . Depression with anxiety 07/24/2019  . Morbid obesity ([place]) 04/04/2018  . Bilateral leg edema 02/07/2017  . Osteoarthritis 01/28/2017  . Urge and stress incontinence 01/28/2017  . History of colonic polyps   . Colon polyp 08/07/2013  .  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 Nonspecific abnormal finding in stool contents 05/29/2013  . Atherosclerotic peripheral vascular disease with intermittent claudication ([place]) 06/14/2012  . Spinal stenosis of lumbar region 06/14/2012  . 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 Hyperlipidemia 11/23/2006  . Essential hypertension 11/23/2006  . HEMATURIA 11/23/2006  . 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   Past Medical History:  Diagnosis Date  . Abdominal hernia   . Allergy   . Blood transfusion without reported diagnosis 12/2006   At [facility] with hip replacement - 2 units transfused  . Cancer ([place])    skin cancers  . Cataract   . Depression   . DJD (degenerative joint  disease)    shoulders bilateral  . Environmental allergies   . Full dentures   . Hearing loss    bilateral hearing aids  . History of urinary tract infection   . Hyperlipidemia    under control  . Hypertension    "borderline"  . Nonspecific abnormal finding in stool contents   . Other general symptoms(780.99)   . Prostate hypertrophy    sees Dr. Gaynelle Arabian   . Urinary leakage     Family History  Problem Relation Age of Onset  . Hypertension Mother   . Diabetes Mother   . Heart failure Mother        cad/mi with rupture ventricle  . Hyperlipidemia Father   . Colon polyps Father   . Cancer Neg Hx        colon or prostate  . Colon cancer Neg Hx   . Esophageal cancer Neg Hx   . Rectal cancer Neg Hx   . Stomach cancer Neg Hx     Past Surgical History:  Procedure Laterality Date  . CATARACT EXTRACTION W/PHACO Right 03/25/2014   Procedure: CATARACT EXTRACTION PHACO AND INTRAOCULAR LENS PLACEMENT; CDE:9.76;  Surgeon: Williams Che, MD;  Location: [facility];  Service: Ophthalmology;  Laterality: Right;  . CATARACT EXTRACTION W/PHACO Left 06/24/2014   Procedure: CATARACT EXTRACTION PHACO AND INTRAOCULAR LENS PLACEMENT (IOC);  Surgeon: Williams Che, MD;  Location: [facility];  Service: Ophthalmology;  Laterality: Left;  CDE:8.45  . CIRCUMCISION N/A 03/26/2016   Procedure: CIRCUMCISION ADULT;  Surgeon: Carolan Clines, MD;  [facility];  Service: Urology;  Laterality: N/A;  . CIRCUMCISION, NON-NEWBORN  1985  . COLONOSCOPY  06-19-13, 03/05/15   per Dr. Deatra Ina, mass at the ileocecal valve plus several polyps, pathology benign, repeat in one year, polyp 2016   . COLONOSCOPY WITH PROPOFOL N/A 07/29/2015   Procedure: COLONOSCOPY WITH PROPOFOL;  Surgeon: Manus Gunning, MD;  [facility];  Service: Gastroenterology;  Laterality: N/A;  . JOINT REPLACEMENT  12/2006   total left hip per Dr. Percell Miller   . LAPAROSCOPIC PARTIAL COLECTOMY N/A 08/07/2013   Procedure: LAPAROSCOPIC  right PARTIAL COLECTOMY;  Surgeon: Leighton Ruff, MD;  [facility];  Service: General;  Laterality: N/A;  . MULTIPLE TOOTH EXTRACTIONS    . PROSTATE SURGERY  03-29-11   had CTT per Dr. Gaynelle Arabian, in office   Social History   Occupational History  . Occupation: Retired  Tobacco Use  . Smoking status: Former Smoker    Packs/day: 1.00    Years: 5.00    Pack years: 5.00    Types: Cigarettes    Quit date: 03/22/1968    Years since quitting: 51.7  . Smokeless tobacco: Former Systems developer    Types: Chew    Quit date:  09/20/2015  . Tobacco comment: occassionally  Substance and Sexual Activity  . Alcohol use: No    Alcohol/week: 0.0 standard drinks  . Drug use: No  . Sexual activity: Not on file

## 2020-01-01 ENCOUNTER — Ambulatory Visit: Payer: Self-pay

## 2020-01-01 ENCOUNTER — Encounter: Payer: Self-pay | Admitting: Orthopedic Surgery

## 2020-01-01 ENCOUNTER — Ambulatory Visit: Payer: Medicare PPO | Admitting: Orthopedic Surgery

## 2020-01-01 DIAGNOSIS — S8265XA Nondisplaced fracture of lateral malleolus of left fibula, initial encounter for closed fracture: Secondary | ICD-10-CM | POA: Diagnosis not present

## 2020-01-01 NOTE — Progress Notes (Signed)
Office Visit Note   Patient: Gabriel Love           Date of Birth: Dec 20, 1940           MRN: [id number] Visit Date: 01/01/2020              Requested by: Laurey Morale, MD [place],  [place] [id number] PCP: Laurey Morale, MD  Chief Complaint  Patient presents with  . Left Ankle - Follow-up      HPI: She comes in today for follow-up on his left ankle. He has a left ankle syndesmosis injury. He has been weightbearing as tolerated in his boot and reports no pain. Due to the patient's significant medical comorbidities we have elected to treat this fracture conservatively  Assessment & Plan: Visit Diagnoses:  1. Closed nondisplaced fracture of lateral malleolus of left fibula, initial encounter     Plan: Patient was provided a prescription for extra-depth shoes with an upright brace and orthotics. Follow-up in 4 weeks. We will continue to wear the sock Follow-Up Instructions: No follow-ups on file.   Ortho Exam  Patient is alert, oriented, no adenopathy, well-dressed, normal affect, normal respiratory effort. Focused examination demonstrates strong palpable pulse mild soft tissue swelling swelling has significantly decreased in his foot ankle and calf since wearing the compression stockings skin is in excellent condition no tenderness to palpation  Imaging: No results found. No images are attached to the encounter.  Labs: Lab Results  Component Value Date   HGBA1C 5.5 02/19/2019   HGBA1C 5.6 02/28/2015   HGBA1C 5.8 07/04/2012   ESRSEDRATE 14 06/06/2013   REPTSTATUS 10/22/2019 FINAL 10/21/2019   CULT MULTIPLE SPECIES PRESENT, SUGGEST RECOLLECTION (A) 10/21/2019   LABORGA ESCHERICHIA COLI 12/24/2012     Lab Results  Component Value Date   ALBUMIN 3.7 02/19/2019   ALBUMIN 4.0 02/14/2017   ALBUMIN 4.3 02/26/2016    No results found for: MG No results found for: VD25OH  No results found for: PREALBUMIN CBC EXTENDED Latest Ref Rng & Units  10/21/2019 02/19/2019 04/04/2018  WBC 4.0 - 10.5 K/uL 12.6(H) 10.4 9.4  RBC 4.22 - 5.81 MIL/uL 4.83 5.18 5.33  HGB 13.0 - 17.0 g/dL 14.3 15.2 15.6  HCT 39 - 52 % 44.5 46.3 47.0  PLT 150 - 400 K/uL 346 199.0 195.0  NEUTROABS 1.7 - 7.7 K/uL 8.8(H) 7.4 6.6  LYMPHSABS 0.7 - 4.0 K/uL 2.2 2.0 1.8     There is no height or weight on file to calculate BMI.  Orders:  Orders Placed This Encounter  Procedures  . XR Ankle Complete Left   No orders of the defined types were placed in this encounter.    Procedures: No procedures performed  Clinical Data: No additional findings.  ROS:  All other systems negative, except as noted in the HPI. Review of Systems  Objective: Vital Signs: There were no vitals taken for this visit.  Specialty Comments:  No specialty comments available.  PMFS History: Patient Active Problem List   Diagnosis Date Noted  . Depression with anxiety 07/24/2019  . Morbid obesity ([place]) 04/04/2018  . Bilateral leg edema 02/07/2017  . Osteoarthritis 01/28/2017  . Urge and stress incontinence 01/28/2017  . History of colonic polyps   . Colon polyp 08/07/2013  . 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 Nonspecific abnormal finding in stool contents 05/29/2013  . Atherosclerotic peripheral vascular disease with intermittent claudication ([place]) 06/14/2012  . Spinal stenosis  of lumbar region 06/14/2012  . 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 Hyperlipidemia 11/23/2006  . Essential hypertension 11/23/2006  . HEMATURIA 11/23/2006  . 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   Past Medical History:  Diagnosis Date  . Abdominal hernia   . Allergy   . Blood transfusion without reported diagnosis 12/2006   At [facility] with hip replacement - 2 units transfused  . Cancer ([place])    skin cancers  . Cataract   . Depression   . DJD (degenerative joint disease)    shoulders bilateral  . Environmental allergies   . Full dentures   .  Hearing loss    bilateral hearing aids  . History of urinary tract infection   . Hyperlipidemia    under control  . Hypertension    "borderline"  . Nonspecific abnormal finding in stool contents   . Other general symptoms(780.99)   . Prostate hypertrophy    sees Dr. Gaynelle Arabian   . Urinary leakage     Family History  Problem Relation Age of Onset  . Hypertension Mother   . Diabetes Mother   . Heart failure Mother        cad/mi with rupture ventricle  . Hyperlipidemia Father   . Colon polyps Father   . Cancer Neg Hx        colon or prostate  . Colon cancer Neg Hx   . Esophageal cancer Neg Hx   . Rectal cancer Neg Hx   . Stomach cancer Neg Hx     Past Surgical History:  Procedure Laterality Date  . CATARACT EXTRACTION W/PHACO Right 03/25/2014   Procedure: CATARACT EXTRACTION PHACO AND INTRAOCULAR LENS PLACEMENT; CDE:9.76;  Surgeon: Williams Che, MD;  [facility];  Service: Ophthalmology;  Laterality: Right;  . CATARACT EXTRACTION W/PHACO Left 06/24/2014   Procedure: CATARACT EXTRACTION PHACO AND INTRAOCULAR LENS PLACEMENT (IOC);  Surgeon: Williams Che, MD;  Location: [facility];  Service: Ophthalmology;  Laterality: Left;  CDE:8.45  . CIRCUMCISION N/A 03/26/2016   Procedure: CIRCUMCISION ADULT;  Surgeon: Carolan Clines, MD;  [facility];  Service: Urology;  Laterality: N/A;  . CIRCUMCISION, NON-NEWBORN  1985  . COLONOSCOPY  06-19-13, 03/05/15   per Dr. Deatra Ina, mass at the ileocecal valve plus several polyps, pathology benign, repeat in one year, polyp 2016   . COLONOSCOPY WITH PROPOFOL N/A 07/29/2015   Procedure: COLONOSCOPY WITH PROPOFOL;  Surgeon: Manus Gunning, MD;  [facility];  Service: Gastroenterology;  Laterality: N/A;  . JOINT REPLACEMENT  12/2006   total left hip per Dr. Percell Miller   . LAPAROSCOPIC PARTIAL COLECTOMY N/A 08/07/2013   Procedure: LAPAROSCOPIC right PARTIAL COLECTOMY;  Surgeon: Leighton Ruff, MD;  [facility];  Service:  General;  Laterality: N/A;  . MULTIPLE TOOTH EXTRACTIONS    . PROSTATE SURGERY  03-29-11   had CTT per Dr. Gaynelle Arabian, in office   Social History   Occupational History  . Occupation: Retired  Tobacco Use  . Smoking status: Former Smoker    Packs/day: 1.00    Years: 5.00    Pack years: 5.00    Types: Cigarettes    Quit date: 03/22/1968    Years since quitting: 51.8  . Smokeless tobacco: Former Systems developer    Types: Chew    Quit date: 09/20/2015  . Tobacco comment: occassionally  Substance and Sexual Activity  . Alcohol use: No    Alcohol/week: 0.0 standard drinks  . Drug use: No  . Sexual activity:  Not on file

## 2020-01-02 ENCOUNTER — Ambulatory Visit: Payer: Medicare PPO | Admitting: Podiatry

## 2020-01-21 ENCOUNTER — Encounter: Payer: Self-pay | Admitting: Family Medicine

## 2020-01-21 ENCOUNTER — Ambulatory Visit: Payer: Medicare PPO | Admitting: Family Medicine

## 2020-01-21 ENCOUNTER — Other Ambulatory Visit: Payer: Self-pay

## 2020-01-21 VITALS — BP 148/92 | HR 88 | Temp 97.8°F | Ht 71.0 in | Wt 332.2 lb

## 2020-01-21 DIAGNOSIS — R739 Hyperglycemia, unspecified: Secondary | ICD-10-CM

## 2020-01-21 DIAGNOSIS — I10 Essential (primary) hypertension: Secondary | ICD-10-CM

## 2020-01-21 DIAGNOSIS — E785 Hyperlipidemia, unspecified: Secondary | ICD-10-CM

## 2020-01-21 DIAGNOSIS — N138 Other obstructive and reflux uropathy: Secondary | ICD-10-CM

## 2020-01-21 DIAGNOSIS — R600 Localized edema: Secondary | ICD-10-CM | POA: Diagnosis not present

## 2020-01-21 DIAGNOSIS — Z23 Encounter for immunization: Secondary | ICD-10-CM

## 2020-01-21 DIAGNOSIS — F418 Other specified anxiety disorders: Secondary | ICD-10-CM

## 2020-01-21 DIAGNOSIS — N401 Enlarged prostate with lower urinary tract symptoms: Secondary | ICD-10-CM | POA: Diagnosis not present

## 2020-01-21 MED ORDER — VENLAFAXINE HCL ER 150 MG PO CP24
150.0000 mg | ORAL_CAPSULE | Freq: Every day | ORAL | 11 refills | Status: DC
Start: 2020-01-21 — End: 2020-02-27

## 2020-01-21 MED ORDER — BUPROPION HCL ER (XL) 150 MG PO TB24: 150.0000 mg | ORAL_TABLET | Freq: Every day | ORAL | 5 refills | Status: DC

## 2020-01-21 NOTE — Progress Notes (Signed)
   Subjective:    Patient ID: Gabriel Love, male    DOB: 10/12/1940, 79 y.o.   MRN: [id number]  HPI Here for several issues. His BP has ben stable. His leg edema has been stable. He takes 1 or 2 tablets of Lasix daily. His wife says his depression and anxiety are somewhat of a problem lately. He admits to worrying about things and they say this makes him irritable. He sleeps well.    Review of Systems  Constitutional: Negative.   Respiratory: Negative.   Cardiovascular: Positive for leg swelling. Negative for chest pain and palpitations.  Psychiatric/Behavioral: Positive for agitation and dysphoric mood. Negative for behavioral problems, confusion, decreased concentration and hallucinations. The patient is nervous/anxious.        Objective:   Physical Exam Constitutional:      Appearance: Normal appearance. He is not ill-appearing.  Cardiovascular:     Rate and Rhythm: Normal rate and regular rhythm.     Pulses: Normal pulses.     Heart sounds: Normal heart sounds.  Pulmonary:     Effort: Pulmonary effort is normal.     Breath sounds: Normal breath sounds.  Musculoskeletal:     Comments: 2+ edema in both lower legs   Neurological:     Mental Status: He is alert.  Psychiatric:        Mood and Affect: Mood normal.        Behavior: Behavior normal.        Thought Content: Thought content normal.        Judgment: Judgment normal.           Assessment & Plan:  His HTN and leg edema are stable. Check labs for renal function and potassium. Check lipids (he is fasting). For the depression and anxiety we will add Wellbutrin XL 150 mg daily to his regimen.  Alysia Penna, MD

## 2020-01-21 NOTE — Addendum Note (Signed)
Addended by: Jacqualin Combes on: 01/21/2020 11:55 AM   Modules accepted: Orders

## 2020-01-22 LAB — HEPATIC FUNCTION PANEL
AG Ratio: 1.3 (calc) (ref 1.0–2.5)
ALT: 18 U/L (ref 9–46)
AST: 26 U/L (ref 10–35)
Albumin: 3.9 g/dL (ref 3.6–5.1)
Alkaline phosphatase (APISO): 110 U/L (ref 35–144)
Bilirubin, Direct: 0.1 mg/dL (ref 0.0–0.2)
Globulin: 3 g/dL (calc) (ref 1.9–3.7)
Indirect Bilirubin: 0.7 mg/dL (calc) (ref 0.2–1.2)
Total Bilirubin: 0.8 mg/dL (ref 0.2–1.2)
Total Protein: 6.9 g/dL (ref 6.1–8.1)

## 2020-01-22 LAB — CBC WITH DIFFERENTIAL/PLATELET
Absolute Monocytes: 721 cells/uL (ref 200–950)
Basophils Absolute: 62 cells/uL (ref 0–200)
Basophils Relative: 0.6 %
Eosinophils Absolute: 134 cells/uL (ref 15–500)
Eosinophils Relative: 1.3 %
HCT: 49 % (ref 38.5–50.0)
Hemoglobin: 15.8 g/dL (ref 13.2–17.1)
Lymphs Abs: 2318 cells/uL (ref 850–3900)
MCH: 29 pg (ref 27.0–33.0)
MCHC: 32.2 g/dL (ref 32.0–36.0)
MCV: 90.1 fL (ref 80.0–100.0)
MPV: 10.9 fL (ref 7.5–12.5)
Monocytes Relative: 7 %
Neutro Abs: 7066 cells/uL (ref 1500–7800)
Neutrophils Relative %: 68.6 %
Platelets: 228 10*3/uL (ref 140–400)
RBC: 5.44 10*6/uL (ref 4.20–5.80)
RDW: 13.1 % (ref 11.0–15.0)
Total Lymphocyte: 22.5 %
WBC: 10.3 10*3/uL (ref 3.8–10.8)

## 2020-01-22 LAB — LIPID PANEL
Cholesterol: 132 mg/dL (ref <200)
HDL: 37 mg/dL — ABNORMAL LOW (ref >=40)
LDL Cholesterol (Calc): 70 mg/dL (calc)
Non-HDL Cholesterol (Calc): 95 mg/dL (calc) (ref <130)
Total CHOL/HDL Ratio: 3.6 (calc) (ref <5.0)
Triglycerides: 186 mg/dL — ABNORMAL HIGH (ref <150)

## 2020-01-22 LAB — BASIC METABOLIC PANEL WITH GFR
BUN: 15 mg/dL (ref 7–25)
CO2: 28 mmol/L (ref 20–32)
Calcium: 9 mg/dL (ref 8.6–10.3)
Chloride: 102 mmol/L (ref 98–110)
Creat: 0.78 mg/dL (ref 0.70–1.11)
GFR, Est African American: 99 mL/min/{1.73_m2} (ref >=60)
GFR, Est Non African American: 85 mL/min/{1.73_m2} (ref >=60)
Glucose, Bld: 85 mg/dL (ref 65–99)
Potassium: 4.5 mmol/L (ref 3.5–5.3)
Sodium: 142 mmol/L (ref 135–146)

## 2020-01-22 LAB — PSA: PSA: 0.62 ng/mL (ref <=4.0)

## 2020-01-22 LAB — TSH: TSH: 2.8 mIU/L (ref 0.40–4.50)

## 2020-01-22 LAB — HEMOGLOBIN A1C
Hgb A1c MFr Bld: 5.3 % of total Hgb (ref <5.7)
Mean Plasma Glucose: 105 (calc)
eAG (mmol/L): 5.8 (calc)

## 2020-01-24 ENCOUNTER — Telehealth: Payer: Self-pay | Admitting: Orthopedic Surgery

## 2020-01-24 NOTE — Telephone Encounter (Signed)
Gabriel Love called on behalf of the pt stating his foot is doing ok but sometimes still gives him some fits; Gabriel Love would like a CB to advise on some different things she can try to stop the pain. Pt does have an appt 01/29/20

## 2020-01-25 NOTE — Telephone Encounter (Signed)
I called to see if the pt could come in today for an appt. I lm on vm to call back and let me know. He should not be having  this level of discomfort and wanted ot know if we could move his appt up from Tuesday. Will sign off on this message and await return call.

## 2020-01-29 ENCOUNTER — Encounter: Payer: Self-pay | Admitting: Orthopedic Surgery

## 2020-01-29 ENCOUNTER — Ambulatory Visit: Payer: Medicare PPO | Admitting: Orthopedic Surgery

## 2020-01-29 VITALS — Ht 71.0 in | Wt 332.0 lb

## 2020-01-29 DIAGNOSIS — S93432S Sprain of tibiofibular ligament of left ankle, sequela: Secondary | ICD-10-CM

## 2020-01-29 DIAGNOSIS — S8265XA Nondisplaced fracture of lateral malleolus of left fibula, initial encounter for closed fracture: Secondary | ICD-10-CM

## 2020-01-29 NOTE — Progress Notes (Addendum)
Office Visit Note   Patient: Gabriel Love           Date of Birth: 1940-12-14           MRN: [id number] Visit Date: 01/29/2020              Requested by: Laurey Morale, MD [place],  [place] [id number] PCP: Laurey Morale, MD  Chief Complaint  Patient presents with  . Left Ankle - Follow-up    Closed nondisplaced fx lateral malleolus left fib       HPI: Patient is an 79 year old gentleman who presents in follow-up status post left ankle fibular fracture and syndesmotic disruption.  Due to the initial massive swelling and fracture blistering patient was unable to safely undergo open reduction internal fixation of the fibula and unable to undergo stabilization of the syndesmotic disruption.  Patient was treated with compression and immobilization.  Patient is about 3-1/2 months out from injury and will require bracing to be able to ambulate.  Assessment & Plan: Visit Diagnoses:  1. Closed nondisplaced fracture of lateral malleolus of left fibula, initial encounter   2. Syndesmotic disruption of ankle, left, sequela     Plan: Patient has been provided a prescription for biotech for extra-depth shoe and double upright braces to provide patient in stable support for ambulation.  Without the bracing patient would be at risk of ankle dislocation and has the potential for amputation secondary to instability of the ankle.  Follow-Up Instructions: Return in about 4 weeks (around 02/26/2020).   Ortho Exam  Patient is alert, oriented, no adenopathy, well-dressed, normal affect, normal respiratory effort. Examination the swelling has significantly decreased with wearing the compression stockings there is no open ulcers there is venous stasis insufficiency but no cellulitis or drainage.  Patient's foot is plantigrade he has valgus alignment through the ankle.  Radiographs shows a subluxed tibiotalar joint with valgus alignment.  Patient's last BMI was 46.  Imaging: No  results found. No images are attached to the encounter.  Labs: Lab Results  Component Value Date   HGBA1C 5.3 01/21/2020   HGBA1C 5.5 02/19/2019   HGBA1C 5.6 02/28/2015   ESRSEDRATE 14 06/06/2013   REPTSTATUS 10/22/2019 FINAL 10/21/2019   CULT MULTIPLE SPECIES PRESENT, SUGGEST RECOLLECTION (A) 10/21/2019   LABORGA ESCHERICHIA COLI 12/24/2012     Lab Results  Component Value Date   ALBUMIN 3.7 02/19/2019   ALBUMIN 4.0 02/14/2017   ALBUMIN 4.3 02/26/2016    No results found for: MG No results found for: VD25OH  No results found for: PREALBUMIN CBC EXTENDED Latest Ref Rng & Units 01/21/2020 10/21/2019 02/19/2019  WBC 3.8 - 10.8 Thousand/uL 10.3 12.6(H) 10.4  RBC 4.20 - 5.80 Million/uL 5.44 4.83 5.18  HGB 13.2 - 17.1 g/dL 15.8 14.3 15.2  HCT 38 - 50 % 49.0 44.5 46.3  PLT 140 - 400 Thousand/uL 228 346 199.0  NEUTROABS 1,500 - 7,800 cells/uL 7,066 8.8(H) 7.4  LYMPHSABS 850 - 3,900 cells/uL 2,318 2.2 2.0     Body mass index is 46.3 kg/m.  Orders:  No orders of the defined types were placed in this encounter.  No orders of the defined types were placed in this encounter.    Procedures: No procedures performed  Clinical Data: No additional findings.  ROS:  All other systems negative, except as noted in the HPI. Review of Systems  Objective: Vital Signs: Ht 5\' 11"  (1.803 m)   Wt (!) 332 lb (150.6 kg)  BMI 46.30 kg/m   Specialty Comments:  No specialty comments available.  PMFS History: Patient Active Problem List   Diagnosis Date Noted  . Depression with anxiety 07/24/2019  . Morbid obesity ([place]) 04/04/2018  . Bilateral leg edema 02/07/2017  . Osteoarthritis 01/28/2017  . Urge and stress incontinence 01/28/2017  . History of colonic polyps   . Colon polyp 08/07/2013  . 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 Nonspecific abnormal finding in stool contents 05/29/2013  . Atherosclerotic peripheral vascular disease with  intermittent claudication ([place]) 06/14/2012  . Spinal stenosis of lumbar region 06/14/2012  . 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 Hyperlipidemia 11/23/2006  . Essential hypertension 11/23/2006  . HEMATURIA 11/23/2006  . 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   Past Medical History:  Diagnosis Date  . Abdominal hernia   . Allergy   . Blood transfusion without reported diagnosis 12/2006   At [facility] with hip replacement - 2 units transfused  . Cancer ([place])    skin cancers  . Cataract   . Depression   . DJD (degenerative joint disease)    shoulders bilateral  . Environmental allergies   . Full dentures   . Hearing loss    bilateral hearing aids  . History of urinary tract infection   . Hyperlipidemia    under control  . Hypertension    "borderline"  . Nonspecific abnormal finding in stool contents   . Other general symptoms(780.99)   . Prostate hypertrophy    sees Dr. Gaynelle Arabian   . Urinary leakage     Family History  Problem Relation Age of Onset  . Hypertension Mother   . Diabetes Mother   . Heart failure Mother        cad/mi with rupture ventricle  . Hyperlipidemia Father   . Colon polyps Father   . Cancer Neg Hx        colon or prostate  . Colon cancer Neg Hx   . Esophageal cancer Neg Hx   . Rectal cancer Neg Hx   . Stomach cancer Neg Hx     Past Surgical History:  Procedure Laterality Date  . CATARACT EXTRACTION W/PHACO Right 03/25/2014   Procedure: CATARACT EXTRACTION PHACO AND INTRAOCULAR LENS PLACEMENT; CDE:9.76;  Surgeon: Williams Che, MD;  Location: [facility];  Service: Ophthalmology;  Laterality: Right;  . CATARACT EXTRACTION W/PHACO Left 06/24/2014   Procedure: CATARACT EXTRACTION PHACO AND INTRAOCULAR LENS PLACEMENT (IOC);  Surgeon: Williams Che, MD;  Location: [facility];  Service: Ophthalmology;  Laterality: Left;  CDE:8.45  . CIRCUMCISION N/A 03/26/2016   Procedure: CIRCUMCISION ADULT;  Surgeon: Carolan Clines, MD;  [facility];   Service: Urology;  Laterality: N/A;  . CIRCUMCISION, NON-NEWBORN  1985  . COLONOSCOPY  06-19-13, 03/05/15   per Dr. Deatra Ina, mass at the ileocecal valve plus several polyps, pathology benign, repeat in one year, polyp 2016   . COLONOSCOPY WITH PROPOFOL N/A 07/29/2015   Procedure: COLONOSCOPY WITH PROPOFOL;  Surgeon: Manus Gunning, MD;  [facility];  Service: Gastroenterology;  Laterality: N/A;  . JOINT REPLACEMENT  12/2006   total left hip per Dr. Percell Miller   . LAPAROSCOPIC PARTIAL COLECTOMY N/A 08/07/2013   Procedure: LAPAROSCOPIC right PARTIAL COLECTOMY;  Surgeon: Leighton Ruff, MD;  [facility]: [facility];  Service: General;  Laterality: N/A;  . MULTIPLE TOOTH EXTRACTIONS    . PROSTATE SURGERY  03-29-11   had CTT per Dr. Gaynelle Arabian, in office   Social History  Occupational History  . Occupation: Retired  Tobacco Use  . Smoking status: Former Smoker    Packs/day: 1.00    Years: 5.00    Pack years: 5.00    Types: Cigarettes    Quit date: 03/22/1968    Years since quitting: 51.8  . Smokeless tobacco: Former Systems developer    Types: Chew    Quit date: 09/20/2015  . Tobacco comment: occassionally  Substance and Sexual Activity  . Alcohol use: No    Alcohol/week: 0.0 standard drinks  . Drug use: No  . Sexual activity: Not on file

## 2020-02-18 DIAGNOSIS — M21072 Valgus deformity, not elsewhere classified, left ankle: Secondary | ICD-10-CM | POA: Diagnosis not present

## 2020-02-25 ENCOUNTER — Telehealth: Payer: Self-pay | Admitting: Family Medicine

## 2020-02-25 NOTE — Telephone Encounter (Addendum)
Patient wife is calling and stated that pt has been taking buPROPion (WELLBUTRIN XL) 150 MG 24 hr tablet and venlafaxine XR (EFFEXOR XR) 150 MG 24 hr capsule and is having crying episodes. Wife is asking if patient could go back to taking Lexapro at a higher dosage, please advise. CB is [phone]

## 2020-02-26 ENCOUNTER — Ambulatory Visit: Payer: Medicare PPO | Admitting: Orthopedic Surgery

## 2020-02-27 MED ORDER — ESCITALOPRAM OXALATE 20 MG PO TABS
20.0000 mg | ORAL_TABLET | Freq: Every day | ORAL | 2 refills | Status: DC
Start: 2020-02-27 — End: 2020-03-03

## 2020-02-27 NOTE — Addendum Note (Signed)
Addended by: Nathanial Millman E on: 02/27/2020 11:01 AM   Modules accepted: Orders

## 2020-02-27 NOTE — Telephone Encounter (Signed)
I spoke with pt's wife, she is aware of message below. New rx sent in, she will let us know how he is.

## 2020-02-27 NOTE — Telephone Encounter (Signed)
I left a message for Gabriel Love to call the office back.

## 2020-02-27 NOTE — Telephone Encounter (Signed)
Noted. Tell him to stop the Effexor (Venlafaxine). Stay on Wellbutrin (Bupropion). Add Lexapro 20 mg daily. Call in #30 with 2 rf

## 2020-02-28 ENCOUNTER — Ambulatory Visit (INDEPENDENT_AMBULATORY_CARE_PROVIDER_SITE_OTHER): Payer: Medicare PPO

## 2020-02-28 ENCOUNTER — Ambulatory Visit: Payer: Medicare PPO | Admitting: Physician Assistant

## 2020-02-28 ENCOUNTER — Encounter: Payer: Self-pay | Admitting: Orthopedic Surgery

## 2020-02-28 DIAGNOSIS — S8265XA Nondisplaced fracture of lateral malleolus of left fibula, initial encounter for closed fracture: Secondary | ICD-10-CM | POA: Diagnosis not present

## 2020-02-28 NOTE — Progress Notes (Signed)
Office Visit Note   Patient: Gabriel Love           Date of Birth: 1940/09/06           MRN: [id number] Visit Date: 02/28/2020              Requested by: Laurey Morale, MD [place],  [place] [id number] PCP: Laurey Morale, MD  Chief Complaint  Patient presents with  . Left Ankle - Follow-up, Fracture      HPI: Patient presents today in follow-up for his left ankle syndesmosis disruption.  He has obtained his brace and extra-depth shoes he occasionally gets burning pain but he also has days when he has no pain at all  Assessment & Plan: Visit Diagnoses:  1. Closed nondisplaced fracture of lateral malleolus of left fibula, initial encounter     Plan: Patient will follow up in a month I emphasized the importance of continuing to keep this good skin is in condition.  And notice any pressure points they are to let us know if they notice anything abnormal  Follow-Up Instructions: No follow-ups on file.   Ortho Exam  Patient is alert, oriented, no adenopathy, well-dressed, normal affect, normal respiratory effort.  Focused examination demonstrates mild to moderate soft tissue swelling no erythema no cellulitis brace and index shoe are fitting appropriately he is wearing a compression stocking.  No breakdown over the area of the ankle.  Imaging: No results found. No images are attached to the encounter.  Labs: Lab Results  Component Value Date   HGBA1C 5.3 01/21/2020   HGBA1C 5.5 02/19/2019   HGBA1C 5.6 02/28/2015   ESRSEDRATE 14 06/06/2013   REPTSTATUS 10/22/2019 FINAL 10/21/2019   CULT MULTIPLE SPECIES PRESENT, SUGGEST RECOLLECTION (A) 10/21/2019   LABORGA ESCHERICHIA COLI 12/24/2012     Lab Results  Component Value Date   ALBUMIN 3.7 02/19/2019   ALBUMIN 4.0 02/14/2017   ALBUMIN 4.3 02/26/2016    No results found for: MG No results found for: VD25OH  No results found for: PREALBUMIN CBC EXTENDED Latest Ref Rng & Units 01/21/2020 10/21/2019  02/19/2019  WBC 3.8 - 10.8 Thousand/uL 10.3 12.6(H) 10.4  RBC 4.20 - 5.80 Million/uL 5.44 4.83 5.18  HGB 13.2 - 17.1 g/dL 15.8 14.3 15.2  HCT 38.5 - 50.0 % 49.0 44.5 46.3  PLT 140 - 400 Thousand/uL 228 346 199.0  NEUTROABS 1,500 - 7,800 cells/uL 7,066 8.8(H) 7.4  LYMPHSABS 850 - 3,900 cells/uL 2,318 2.2 2.0     There is no height or weight on file to calculate BMI.  Orders:  Orders Placed This Encounter  Procedures  . XR Ankle Complete Left   No orders of the defined types were placed in this encounter.    Procedures: No procedures performed  Clinical Data: No additional findings.  ROS:  All other systems negative, except as noted in the HPI. Review of Systems  Objective: Vital Signs: There were no vitals taken for this visit.  Specialty Comments:  No specialty comments available.  PMFS History: Patient Active Problem List   Diagnosis Date Noted  . Depression with anxiety 07/24/2019  . Morbid obesity ([place]) 04/04/2018  . Bilateral leg edema 02/07/2017  . Osteoarthritis 01/28/2017  . Urge and stress incontinence 01/28/2017  . History of colonic polyps   . Colon polyp 08/07/2013  . 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 Nonspecific abnormal finding in stool contents 05/29/2013  . Atherosclerotic peripheral vascular disease  with intermittent claudication ([place]) 06/14/2012  . Spinal stenosis of lumbar region 06/14/2012  . 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 Hyperlipidemia 11/23/2006  . Essential hypertension 11/23/2006  . HEMATURIA 11/23/2006  . 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   Past Medical History:  Diagnosis Date  . Abdominal hernia   . Allergy   . Blood transfusion without reported diagnosis 12/2006   At [facility] with hip replacement - 2 units transfused  . Cancer ([place])    skin cancers  . Cataract   . Depression   . DJD (degenerative joint disease)    shoulders bilateral  . Environmental allergies    . Full dentures   . Hearing loss    bilateral hearing aids  . History of urinary tract infection   . Hyperlipidemia    under control  . Hypertension    "borderline"  . Nonspecific abnormal finding in stool contents   . Other general symptoms(780.99)   . Prostate hypertrophy    sees Dr. Gaynelle Arabian   . Urinary leakage     Family History  Problem Relation Age of Onset  . Hypertension Mother   . Diabetes Mother   . Heart failure Mother        cad/mi with rupture ventricle  . Hyperlipidemia Father   . Colon polyps Father   . Cancer Neg Hx        colon or prostate  . Colon cancer Neg Hx   . Esophageal cancer Neg Hx   . Rectal cancer Neg Hx   . Stomach cancer Neg Hx     Past Surgical History:  Procedure Laterality Date  . CATARACT EXTRACTION W/PHACO Right 03/25/2014   Procedure: CATARACT EXTRACTION PHACO AND INTRAOCULAR LENS PLACEMENT; CDE:9.76;  Surgeon: Williams Che, MD;  Location: [facility];  Service: Ophthalmology;  Laterality: Right;  . CATARACT EXTRACTION W/PHACO Left 06/24/2014   Procedure: CATARACT EXTRACTION PHACO AND INTRAOCULAR LENS PLACEMENT (IOC);  Surgeon: Williams Che, MD;  Location: [facility];  Service: Ophthalmology;  Laterality: Left;  CDE:8.45  . CIRCUMCISION N/A 03/26/2016   Procedure: CIRCUMCISION ADULT;  Surgeon: Carolan Clines, MD;  Location: [facility];  Service: Urology;  Laterality: N/A;  . CIRCUMCISION, NON-NEWBORN  1985  . COLONOSCOPY  06-19-13, 03/05/15   per Dr. Deatra Ina, mass at the ileocecal valve plus several polyps, pathology benign, repeat in one year, polyp 2016   . COLONOSCOPY WITH PROPOFOL N/A 07/29/2015   Procedure: COLONOSCOPY WITH PROPOFOL;  Surgeon: Manus Gunning, MD;  [facility];  Service: Gastroenterology;  Laterality: N/A;  . JOINT REPLACEMENT  12/2006   total left hip per Dr. Percell Miller   . LAPAROSCOPIC PARTIAL COLECTOMY N/A 08/07/2013   Procedure: LAPAROSCOPIC right PARTIAL COLECTOMY;  Surgeon: Leighton Ruff, MD;  Location:  [facility];  Service: General;  Laterality: N/A;  . MULTIPLE TOOTH EXTRACTIONS    . PROSTATE SURGERY  03-29-11   had CTT per Dr. Gaynelle Arabian, in office   Social History   Occupational History  . Occupation: Retired  Tobacco Use  . Smoking status: Former Smoker    Packs/day: 1.00    Years: 5.00    Pack years: 5.00    Types: Cigarettes    Quit date: 03/22/1968    Years since quitting: 51.9  . Smokeless tobacco: Former Systems developer    Types: Chew    Quit date: 09/20/2015  . Tobacco comment: occassionally  Substance and Sexual Activity  . Alcohol use: No    Alcohol/week: 0.0 standard drinks  .  Drug use: No  . Sexual activity: Not on file       

## 2020-02-29 ENCOUNTER — Telehealth: Payer: Self-pay | Admitting: Family Medicine

## 2020-02-29 NOTE — Telephone Encounter (Signed)
Gabriel Love call and stated pt can't take generic Lexapro and need it changed at the pharmacy for the brand.

## 2020-03-03 ENCOUNTER — Telehealth: Payer: Self-pay | Admitting: Family Medicine

## 2020-03-03 MED ORDER — LEXAPRO 20 MG PO TABS: 20.0000 mg | ORAL_TABLET | Freq: Every day | ORAL | 2 refills | Status: DC

## 2020-03-03 NOTE — Telephone Encounter (Signed)
Rx sent in

## 2020-03-03 NOTE — Telephone Encounter (Signed)
Izora Gala (pt's wife) called and stated generic Lexapro was called in to the pharmacy, however the pt can't take the generic form of this medication, he has taken it before.  Patient is requesting name brand of this medication.    [facility] in [place].

## 2020-03-03 NOTE — Telephone Encounter (Signed)
See other telephone encounter.

## 2020-03-11 ENCOUNTER — Other Ambulatory Visit: Payer: Self-pay

## 2020-03-11 ENCOUNTER — Ambulatory Visit: Payer: Medicare PPO | Admitting: Podiatry

## 2020-03-11 ENCOUNTER — Encounter: Payer: Self-pay | Admitting: Podiatry

## 2020-03-11 DIAGNOSIS — M21371 Foot drop, right foot: Secondary | ICD-10-CM

## 2020-03-11 DIAGNOSIS — I739 Peripheral vascular disease, unspecified: Secondary | ICD-10-CM

## 2020-03-11 DIAGNOSIS — M21372 Foot drop, left foot: Secondary | ICD-10-CM

## 2020-03-11 DIAGNOSIS — M79674 Pain in right toe(s): Secondary | ICD-10-CM

## 2020-03-11 DIAGNOSIS — B351 Tinea unguium: Secondary | ICD-10-CM | POA: Diagnosis not present

## 2020-03-11 DIAGNOSIS — M79675 Pain in left toe(s): Secondary | ICD-10-CM | POA: Diagnosis not present

## 2020-03-11 NOTE — Patient Instructions (Signed)
EPSOM SALT FOOT SOAK INSTRUCTIONS  *IF YOU HAVE BEEN PRESCRIBED ANTIBIOTICS, TAKE AS INSTRUCTED UNTIL ALL ARE GONE*  Shopping List:  A. Plain epsom salt (not scented) B. Neosporin Cream/Ointment or Bacitracin Cream/Ointment (or prescribed antiobiotic drops/cream/ointment) C. 1-inch fabric band-aids  1.  Place 1/4 cup of epsom salts in 2 quarts of warm tap water. IF YOU ARE DIABETIC, OR HAVE NEUROPATHY, CHECK THE TEMPERATURE OF THE WATER WITH YOUR ELBOW.  2.  Submerge your foot/feet in the solution and soak for 10-15 minutes.      3.  Next, remove your foot/feet from solution, blot dry the affected area.    4.  Apply light amount of antibiotic cream/ointment and cover with fabric band-aid .  5.  This soak should be done once a day for 3 days.   6.  Monitor for any signs/symptoms of infection such as redness, swelling, odor, drainage, increased pain, or non-healing of digit.   7.  Please do not hesitate to call the office and speak to a Nurse or Doctor if you have questions.   8.  If you experience fever, chills, nightsweats, nausea or vomiting with worsening of digit, please go to the emergency room.   

## 2020-03-11 NOTE — Progress Notes (Signed)
Subjective: Gabriel Love is a pleasant 79 y.o. male patient seen today at [facility]. Pt has h/o NIDDM with PAD and painful thick toenails that are difficult to trim. Pain interferes with ambulation. Aggravating factors include wearing enclosed shoe gear. Pain is relieved with periodic professional debridement.   Patient's wife is present during today's visit. He broke left ankle in July and is now wearing a brace. Surgical correction is not an option per patient.  Patient relates sore left great toe today and suspects ingrown toenail. Denies any redness, drainage or swelling.  Past Medical History:  Diagnosis Date  . Abdominal hernia   . Allergy   . Blood transfusion without reported diagnosis 12/2006   At [facility] with hip replacement - 2 units transfused  . Cancer ([place])    skin cancers  . Cataract   . Depression   . DJD (degenerative joint disease)    shoulders bilateral  . Environmental allergies   . Full dentures   . Hearing loss    bilateral hearing aids  . History of urinary tract infection   . Hyperlipidemia    under control  . Hypertension    "borderline"  . Nonspecific abnormal finding in stool contents   . Other general symptoms(780.99)   . Prostate hypertrophy    sees Dr. Gaynelle Arabian   . Urinary leakage     Patient Active Problem List   Diagnosis Date Noted  . Depression with anxiety 07/24/2019  . Morbid obesity ([place]) 04/04/2018  . Bilateral leg edema 02/07/2017  . Osteoarthritis 01/28/2017  . Urge and stress incontinence 01/28/2017  . History of colonic polyps   . Colon polyp 08/07/2013  . 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 Nonspecific abnormal finding in stool contents 05/29/2013  . Atherosclerotic peripheral vascular disease with intermittent claudication ([place]) 06/14/2012  . Spinal stenosis of lumbar region 06/14/2012  . 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 Hyperlipidemia 11/23/2006  . Essential  hypertension 11/23/2006  . HEMATURIA 11/23/2006  . 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    Current Outpatient Medications on File Prior to Visit  Medication Sig Dispense Refill  . albuterol (VENTOLIN HFA) 108 (90 Base) MCG/ACT inhaler INHALE 2 PUFFS BY MOUTH EVERY 4 HOURS 8.5 g 2  . Ascorbic Acid (VITAMIN C PO) Take by mouth daily. Unsure of dosage.    Marland Kitchen aspirin EC 81 MG tablet Take 81 mg by mouth daily.    Marland Kitchen atorvastatin (LIPITOR) 40 MG tablet Take 1 tablet (40 mg total) by mouth daily. 90 tablet 3  . buPROPion (WELLBUTRIN XL) 150 MG 24 hr tablet Take 1 tablet (150 mg total) by mouth daily. 30 tablet 5  . clotrimazole-betamethasone (LOTRISONE) cream Apply 1 application topically daily. Applies to affected area.  0  . furosemide (LASIX) 40 MG tablet TAKE 1 TABLET(40 MG) BY MOUTH TWICE DAILY 180 tablet 3  . LEXAPRO 20 MG tablet Take 1 tablet (20 mg total) by mouth daily. 30 tablet 2  . metoprolol succinate (TOPROL-XL) 50 MG 24 hr tablet TAKE 1 TABLET BY MOUTH ONCE DAILY WITH OR IMMEDIATELY FOLLOWING A MEAL 90 tablet 3  . Multiple Vitamins-Minerals (CENTRUM ADULTS PO) Take by mouth.    . mupirocin ointment (BACTROBAN) 2 % Apply topically 2 (two) times daily. 22 g 1  . MYRBETRIQ 50 MG TB24 tablet Take 50 mg by mouth daily.     Marland Kitchen oxyCODONE (ROXICODONE) 5 MG immediate release tablet Take 1 tablet (5 mg total)  by mouth every 4 (four) hours as needed for severe pain. 6 tablet 0  . potassium chloride SA (KLOR-CON) 20 MEQ tablet Take 1 tablet (20 mEq total) by mouth daily. 90 tablet 3  . traMADol-acetaminophen (ULTRACET) 37.5-325 MG tablet Take 1 tablet by mouth every 6 (six) hours as needed. 30 tablet 0   No current facility-administered medications on file prior to visit.    Allergies  Allergen Reactions  . Ivp Dye [Iodinated Diagnostic Agents] Anaphylaxis    Iv dye allergy in 1985, anaphylaxix//a.calhoun- "shocked back to life"  . Lisinopril Cough  . Penicillins Other (See Comments)     Urticaria Has patient had a PCN reaction causing immediate rash, facial/tongue/throat swelling, SOB or lightheadedness with hypotension: no Has patient had a PCN reaction causing severe rash involving mucus membranes or skin necrosis: no Has patient had a PCN reaction that required hospitalization no Has patient had a PCN reaction occurring within the last 10 years: yes If all of the above answers are "NO", then may proceed with Cephalosporin use.    Objective: Physical Exam  General: Gabriel Love is a pleasant 79 y.o. Caucasian male, morbidly obese in NAD. AAO x 3.   Vascular:  Capillary fill time to digits <3 seconds b/l lower extremities. Palpable DP pulse(s) b/l lower extremities Nonpalpable PT pulse(s) b/l lower extremities. Pedal hair absent. Lower extremity skin temperature gradient within normal limits. No pain with calf compression b/l. Trace edema noted b/l lower extremities.  Dermatological:  Pedal skin with normal turgor, texture and tone bilaterally. No open wounds bilaterally. No interdigital macerations bilaterally. Toenails 1-5 b/l elongated, discolored, dystrophic, thickened, crumbly with subungual debris and tenderness to dorsal palpation.  Musculoskeletal:  Muscle strength 5/5 with plantarflexion, inversion, and eversion b/l. Dropfoot b/l lower extremities. No pain crepitus or joint limitation noted with ROM b/l. No gross bony deformities bilaterally.  Wearing brace in shoe for LLE.  Neurological:  Protective sensation intact 5/5 intact bilaterally with 10g monofilament b/l. Vibratory sensation intact b/l. Proprioception intact bilaterally.  Assessment and Plan:  1. Pain due to onychomycosis of toenails of both feet   2. Foot drop, bilateral   3. Peripheral vascular disease with claudication ([place])    -Examined patient. -Toenails 1-5 b/l were debrided in length and girth with sterile nail nippers and dremel without iatrogenic bleeding.  -Offending nail border  debrided and curretaged L hallux and R 3rd toe utilizing sterile nail nipper and currette. Border(s) cleansed with alcohol and triple antibiotic ointment applied. Dispensed written instructions for once daily epsom salt soaks for 3 days. -Patient to report any pedal injuries to medical professional immediately. -Patient/POA to call should there be question/concern in the interim.  Return in about 3 months (around 06/09/2020) for diabetic nail trim.  Marzetta Board, DPM

## 2020-03-17 ENCOUNTER — Telehealth: Payer: Self-pay | Admitting: Family Medicine

## 2020-03-17 NOTE — Telephone Encounter (Signed)
Left message for patient to call back and schedule Medicare Annual Wellness Visit (AWV) either virtually or in office.   Last AWV  please schedule at anytime with [facility] Nurse Health Advisor 1 or 2   This should be a 45 minute visit. 

## 2020-03-27 ENCOUNTER — Ambulatory Visit: Payer: Medicare PPO | Admitting: Orthopedic Surgery

## 2020-03-27 ENCOUNTER — Ambulatory Visit (INDEPENDENT_AMBULATORY_CARE_PROVIDER_SITE_OTHER): Payer: Medicare PPO

## 2020-03-27 DIAGNOSIS — S93432S Sprain of tibiofibular ligament of left ankle, sequela: Secondary | ICD-10-CM | POA: Diagnosis not present

## 2020-03-27 DIAGNOSIS — S93432D Sprain of tibiofibular ligament of left ankle, subsequent encounter: Secondary | ICD-10-CM | POA: Diagnosis not present

## 2020-03-29 ENCOUNTER — Other Ambulatory Visit: Payer: Self-pay | Admitting: Family Medicine

## 2020-03-31 ENCOUNTER — Encounter: Payer: Self-pay | Admitting: Orthopedic Surgery

## 2020-03-31 NOTE — Progress Notes (Signed)
Office Visit Note   Patient: Gabriel Love           Date of Birth: December 25, 1940           MRN: [id number] Visit Date: 03/27/2020              Requested by: Laurey Morale, MD [place],  [place] [id number] PCP: Laurey Morale, MD  Chief Complaint  Patient presents with  . Left Ankle - Follow-up    5 1/2 months out left ankle fib fx       HPI: Patient is an 80 year old gentleman who presents 5 and half months status post left ankle fibular fracture with syndesmosis disruption.  Patient was not a surgical candidate and has been immobilized.  He is currently in a double upright brace with compression stockings.  Assessment & Plan: Visit Diagnoses:  1. Syndesmotic disruption of ankle, left, sequela     Plan: Arnette Norris was added to the double upright bracing to unload pressure points.  Patient states that this felt better.  He will continue with his compression stockings weightbearing as tolerated repeat three-view radiographs at follow-up in 4 weeks.  Follow-Up Instructions: Return in about 4 weeks (around 04/24/2020).   Ortho Exam  Patient is alert, oriented, no adenopathy, well-dressed, normal affect, normal respiratory effort. Examination patient has good pulses he does have venous stasis swelling there are indentations from where the straps from the double upright brace impinged on the skin.  There is no cellulitis patient has no pain with weightbearing.  Imaging: No results found. No images are attached to the encounter.  Labs: Lab Results  Component Value Date   HGBA1C 5.3 01/21/2020   HGBA1C 5.5 02/19/2019   HGBA1C 5.6 02/28/2015   ESRSEDRATE 14 06/06/2013   REPTSTATUS 10/22/2019 FINAL 10/21/2019   CULT MULTIPLE SPECIES PRESENT, SUGGEST RECOLLECTION (A) 10/21/2019   LABORGA ESCHERICHIA COLI 12/24/2012     Lab Results  Component Value Date   ALBUMIN 3.7 02/19/2019   ALBUMIN 4.0 02/14/2017   ALBUMIN 4.3 02/26/2016    No results found for:  MG No results found for: VD25OH  No results found for: PREALBUMIN CBC EXTENDED Latest Ref Rng & Units 01/21/2020 10/21/2019 02/19/2019  WBC 3.8 - 10.8 Thousand/uL 10.3 12.6(H) 10.4  RBC 4.20 - 5.80 Million/uL 5.44 4.83 5.18  HGB 13.2 - 17.1 g/dL 15.8 14.3 15.2  HCT 38.5 - 50.0 % 49.0 44.5 46.3  PLT 140 - 400 Thousand/uL 228 346 199.0  NEUTROABS 1,500 - 7,800 cells/uL 7,066 8.8(H) 7.4  LYMPHSABS 850 - 3,900 cells/uL 2,318 2.2 2.0     There is no height or weight on file to calculate BMI.  Orders:  Orders Placed This Encounter  Procedures  . XR Ankle Complete Left   No orders of the defined types were placed in this encounter.    Procedures: No procedures performed  Clinical Data: No additional findings.  ROS:  All other systems negative, except as noted in the HPI. Review of Systems  Objective: Vital Signs: There were no vitals taken for this visit.  Specialty Comments:  No specialty comments available.  PMFS History: Patient Active Problem List   Diagnosis Date Noted  . Depression with anxiety 07/24/2019  . Morbid obesity ([place]) 04/04/2018  . Bilateral leg edema 02/07/2017  . Osteoarthritis 01/28/2017  . Urge and stress incontinence 01/28/2017  . History of colonic polyps   . Colon polyp 08/07/2013  . Benign neoplasm of ileocecal valve, possible  cancer 07/01/2013  . Nonspecific abnormal finding in stool contents 05/29/2013  . Atherosclerotic peripheral vascular disease with intermittent claudication ([place]) 06/14/2012  . Spinal stenosis of lumbar region 06/14/2012  . 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 Hyperlipidemia 11/23/2006  . Essential hypertension 11/23/2006  . HEMATURIA 11/23/2006  . 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   Past Medical History:  Diagnosis Date  . Abdominal hernia   . Allergy   . Blood transfusion without reported diagnosis 12/2006   At [facility] with hip replacement - 2 units transfused  . Cancer ([place])    skin  cancers  . Cataract   . Depression   . DJD (degenerative joint disease)    shoulders bilateral  . Environmental allergies   . Full dentures   . Hearing loss    bilateral hearing aids  . History of urinary tract infection   . Hyperlipidemia    under control  . Hypertension    "borderline"  . Nonspecific abnormal finding in stool contents   . Other general symptoms(780.99)   . Prostate hypertrophy    sees Dr. Gaynelle Arabian   . Urinary leakage     Family History  Problem Relation Age of Onset  . Hypertension Mother   . Diabetes Mother   . Heart failure Mother        cad/mi with rupture ventricle  . Hyperlipidemia Father   . Colon polyps Father   . Cancer Neg Hx        colon or prostate  . Colon cancer Neg Hx   . Esophageal cancer Neg Hx   . Rectal cancer Neg Hx   . Stomach cancer Neg Hx     Past Surgical History:  Procedure Laterality Date  . CATARACT EXTRACTION W/PHACO Right 03/25/2014   Procedure: CATARACT EXTRACTION PHACO AND INTRAOCULAR LENS PLACEMENT; CDE:9.76;  Surgeon: Williams Che, MD;  Location: [facility];  Service: Ophthalmology;  Laterality: Right;  . CATARACT EXTRACTION W/PHACO Left 06/24/2014   Procedure: CATARACT EXTRACTION PHACO AND INTRAOCULAR LENS PLACEMENT (IOC);  Surgeon: Williams Che, MD;  Location: [facility];  Service: Ophthalmology;  Laterality: Left;  CDE:8.45  . CIRCUMCISION N/A 03/26/2016   Procedure: CIRCUMCISION ADULT;  Surgeon: Carolan Clines, MD;  Location[facility];  Service: Urology;  Laterality: N/A;  . CIRCUMCISION, NON-NEWBORN  1985  . COLONOSCOPY  06-19-13, 03/05/15   per Dr. Deatra Ina, mass at the ileocecal valve plus several polyps, pathology benign, repeat in one year, polyp 2016   . COLONOSCOPY WITH PROPOFOL N/A 07/29/2015   Procedure: COLONOSCOPY WITH PROPOFOL;  Surgeon: Manus Gunning, MD;  [facility];  Service: Gastroenterology;  Laterality: N/A;  . JOINT REPLACEMENT  12/2006   total left hip per Dr. Percell Miller   .  LAPAROSCOPIC PARTIAL COLECTOMY N/A 08/07/2013   Procedure: LAPAROSCOPIC right PARTIAL COLECTOMY;  Surgeon: Leighton Ruff, MD;  Location: [facility];  Service: General;  Laterality: N/A;  . MULTIPLE TOOTH EXTRACTIONS    . PROSTATE SURGERY  03-29-11   had CTT per Dr. Gaynelle Arabian, in office   Social History   Occupational History  . Occupation: Retired  Tobacco Use  . Smoking status: Former Smoker    Packs/day: 1.00    Years: 5.00    Pack years: 5.00    Types: Cigarettes    Quit date: 03/22/1968    Years since quitting: 52.0  . Smokeless tobacco: Former Systems developer    Types: Chew    Quit date: 09/20/2015  . Tobacco comment: occassionally  Substance and Sexual Activity  . Alcohol use: No    Alcohol/week: 0.0 standard drinks  . Drug use: No  . Sexual activity: Not on file

## 2020-04-24 ENCOUNTER — Ambulatory Visit: Payer: Medicare PPO | Admitting: Physician Assistant

## 2020-05-08 ENCOUNTER — Other Ambulatory Visit: Payer: Self-pay

## 2020-05-08 ENCOUNTER — Encounter (HOSPITAL_COMMUNITY): Payer: Self-pay

## 2020-05-08 ENCOUNTER — Emergency Department (HOSPITAL_COMMUNITY): Payer: Medicare PPO

## 2020-05-08 ENCOUNTER — Inpatient Hospital Stay (HOSPITAL_COMMUNITY)
Admission: EM | Admit: 2020-05-08 | Discharge: 2020-05-10 | DRG: 872 | Disposition: A | Discharge status: Home / Self Care | Payer: Medicare PPO | Attending: Internal Medicine | Admitting: Internal Medicine

## 2020-05-08 DIAGNOSIS — Z7982 Long term (current) use of aspirin: Secondary | ICD-10-CM | POA: Diagnosis not present

## 2020-05-08 DIAGNOSIS — E669 Obesity, unspecified: Secondary | ICD-10-CM | POA: Diagnosis not present

## 2020-05-08 DIAGNOSIS — R110 Nausea: Secondary | ICD-10-CM | POA: Diagnosis not present

## 2020-05-08 DIAGNOSIS — Z87891 Personal history of nicotine dependence: Secondary | ICD-10-CM

## 2020-05-08 DIAGNOSIS — F418 Other specified anxiety disorders: Secondary | ICD-10-CM | POA: Diagnosis present

## 2020-05-08 DIAGNOSIS — E66811 Obesity, class 1: Secondary | ICD-10-CM | POA: Diagnosis present

## 2020-05-08 DIAGNOSIS — E785 Hyperlipidemia, unspecified: Secondary | ICD-10-CM | POA: Diagnosis present

## 2020-05-08 DIAGNOSIS — Z91041 Radiographic dye allergy status: Secondary | ICD-10-CM

## 2020-05-08 DIAGNOSIS — N400 Enlarged prostate without lower urinary tract symptoms: Secondary | ICD-10-CM | POA: Diagnosis present

## 2020-05-08 DIAGNOSIS — R112 Nausea with vomiting, unspecified: Secondary | ICD-10-CM

## 2020-05-08 DIAGNOSIS — K654 Sclerosing mesenteritis: Secondary | ICD-10-CM | POA: Diagnosis not present

## 2020-05-08 DIAGNOSIS — R0902 Hypoxemia: Secondary | ICD-10-CM | POA: Diagnosis not present

## 2020-05-08 DIAGNOSIS — Z0389 Encounter for observation for other suspected diseases and conditions ruled out: Secondary | ICD-10-CM | POA: Diagnosis not present

## 2020-05-08 DIAGNOSIS — I5189 Other ill-defined heart diseases: Secondary | ICD-10-CM

## 2020-05-08 DIAGNOSIS — Z85828 Personal history of other malignant neoplasm of skin: Secondary | ICD-10-CM

## 2020-05-08 DIAGNOSIS — R262 Difficulty in walking, not elsewhere classified: Secondary | ICD-10-CM | POA: Diagnosis not present

## 2020-05-08 DIAGNOSIS — Z20822 Contact with and (suspected) exposure to covid-19: Secondary | ICD-10-CM | POA: Diagnosis not present

## 2020-05-08 DIAGNOSIS — I10 Essential (primary) hypertension: Secondary | ICD-10-CM | POA: Diagnosis not present

## 2020-05-08 DIAGNOSIS — E871 Hypo-osmolality and hyponatremia: Secondary | ICD-10-CM | POA: Diagnosis present

## 2020-05-08 DIAGNOSIS — E782 Mixed hyperlipidemia: Secondary | ICD-10-CM | POA: Diagnosis present

## 2020-05-08 DIAGNOSIS — E441 Mild protein-calorie malnutrition: Secondary | ICD-10-CM | POA: Diagnosis present

## 2020-05-08 DIAGNOSIS — Z9049 Acquired absence of other specified parts of digestive tract: Secondary | ICD-10-CM

## 2020-05-08 DIAGNOSIS — R000 Tachycardia, unspecified: Secondary | ICD-10-CM | POA: Diagnosis not present

## 2020-05-08 DIAGNOSIS — E860 Dehydration: Secondary | ICD-10-CM | POA: Diagnosis not present

## 2020-05-08 DIAGNOSIS — Z6832 Body mass index (BMI) 32.0-32.9, adult: Secondary | ICD-10-CM | POA: Diagnosis not present

## 2020-05-08 DIAGNOSIS — Z96642 Presence of left artificial hip joint: Secondary | ICD-10-CM | POA: Diagnosis present

## 2020-05-08 DIAGNOSIS — R197 Diarrhea, unspecified: Secondary | ICD-10-CM | POA: Diagnosis not present

## 2020-05-08 DIAGNOSIS — E876 Hypokalemia: Secondary | ICD-10-CM | POA: Diagnosis present

## 2020-05-08 DIAGNOSIS — Z79899 Other long term (current) drug therapy: Secondary | ICD-10-CM | POA: Diagnosis not present

## 2020-05-08 DIAGNOSIS — K529 Noninfective gastroenteritis and colitis, unspecified: Secondary | ICD-10-CM | POA: Diagnosis not present

## 2020-05-08 DIAGNOSIS — N390 Urinary tract infection, site not specified: Secondary | ICD-10-CM | POA: Diagnosis not present

## 2020-05-08 DIAGNOSIS — R0602 Shortness of breath: Secondary | ICD-10-CM | POA: Diagnosis not present

## 2020-05-08 DIAGNOSIS — E8809 Other disorders of plasma-protein metabolism, not elsewhere classified: Secondary | ICD-10-CM | POA: Diagnosis present

## 2020-05-08 DIAGNOSIS — I70219 Atherosclerosis of native arteries of extremities with intermittent claudication, unspecified extremity: Secondary | ICD-10-CM | POA: Diagnosis not present

## 2020-05-08 DIAGNOSIS — A4152 Sepsis due to Pseudomonas: Secondary | ICD-10-CM | POA: Diagnosis not present

## 2020-05-08 DIAGNOSIS — A419 Sepsis, unspecified organism: Secondary | ICD-10-CM | POA: Diagnosis not present

## 2020-05-08 DIAGNOSIS — M79606 Pain in leg, unspecified: Secondary | ICD-10-CM | POA: Diagnosis not present

## 2020-05-08 DIAGNOSIS — L570 Actinic keratosis: Secondary | ICD-10-CM | POA: Diagnosis present

## 2020-05-08 DIAGNOSIS — K429 Umbilical hernia without obstruction or gangrene: Secondary | ICD-10-CM | POA: Diagnosis not present

## 2020-05-08 HISTORY — DX: Personal history of colonic polyps: Z86.010

## 2020-05-08 HISTORY — DX: Other ill-defined heart diseases: I51.89

## 2020-05-08 HISTORY — DX: Personal history of colon polyps, unspecified: Z86.0100

## 2020-05-08 LAB — CBC WITH DIFFERENTIAL/PLATELET
Abs Immature Granulocytes: 0.04 10*3/uL (ref 0.00–0.07)
Basophils Absolute: 0 10*3/uL (ref 0.0–0.1)
Basophils Relative: 0 %
Eosinophils Absolute: 0 10*3/uL (ref 0.0–0.5)
Eosinophils Relative: 0 %
HCT: 45.7 % (ref 39.0–52.0)
Hemoglobin: 14.5 g/dL (ref 13.0–17.0)
Immature Granulocytes: 1 %
Lymphocytes Relative: 11 %
Lymphs Abs: 1 10*3/uL (ref 0.7–4.0)
MCH: 29.2 pg (ref 26.0–34.0)
MCHC: 31.7 g/dL (ref 30.0–36.0)
MCV: 92.1 fL (ref 80.0–100.0)
Monocytes Absolute: 0.5 10*3/uL (ref 0.1–1.0)
Monocytes Relative: 6 %
Neutro Abs: 6.9 10*3/uL (ref 1.7–7.7)
Neutrophils Relative %: 82 %
Platelets: 158 10*3/uL (ref 150–400)
RBC: 4.96 MIL/uL (ref 4.22–5.81)
RDW: 14.7 % (ref 11.5–15.5)
WBC: 8.4 10*3/uL (ref 4.0–10.5)
nRBC: 0 % (ref 0.0–0.2)

## 2020-05-08 LAB — URINALYSIS, ROUTINE W REFLEX MICROSCOPIC
Bilirubin Urine: NEGATIVE
Glucose, UA: NEGATIVE mg/dL
Hgb urine dipstick: NEGATIVE
Ketones, ur: NEGATIVE mg/dL
Nitrite: NEGATIVE
Protein, ur: 30 mg/dL — AB
Specific Gravity, Urine: 1.015 (ref 1.005–1.030)
WBC, UA: >50 WBC/hpf — ABNORMAL HIGH (ref 0–5)
pH: 5 (ref 5.0–8.0)

## 2020-05-08 LAB — COMPREHENSIVE METABOLIC PANEL
ALT: 26 U/L (ref 0–44)
AST: 34 U/L (ref 15–41)
Albumin: 3.3 g/dL — ABNORMAL LOW (ref 3.5–5.0)
Alkaline Phosphatase: 73 U/L (ref 38–126)
Anion gap: 5 (ref 5–15)
BUN: 14 mg/dL (ref 8–23)
CO2: 25 mmol/L (ref 22–32)
Calcium: 7.9 mg/dL — ABNORMAL LOW (ref 8.9–10.3)
Chloride: 101 mmol/L (ref 98–111)
Creatinine, Ser: 0.75 mg/dL (ref 0.61–1.24)
GFR, Estimated: >60 mL/min (ref >60)
Glucose, Bld: 102 mg/dL — ABNORMAL HIGH (ref 70–99)
Potassium: 3.1 mmol/L — ABNORMAL LOW (ref 3.5–5.1)
Sodium: 131 mmol/L — ABNORMAL LOW (ref 135–145)
Total Bilirubin: 0.9 mg/dL (ref 0.3–1.2)
Total Protein: 6.3 g/dL — ABNORMAL LOW (ref 6.5–8.1)

## 2020-05-08 LAB — PHOSPHORUS: Phosphorus: 2.4 mg/dL — ABNORMAL LOW (ref 2.5–4.6)

## 2020-05-08 LAB — PROTIME-INR
INR: 1.2 (ref 0.8–1.2)
Prothrombin Time: 14.6 seconds (ref 11.4–15.2)

## 2020-05-08 LAB — APTT: aPTT: 34 seconds (ref 24–36)

## 2020-05-08 LAB — LACTIC ACID, PLASMA
Lactic Acid, Venous: 1.3 mmol/L (ref 0.5–1.9)
Lactic Acid, Venous: 0.8 mmol/L (ref 0.5–1.9)

## 2020-05-08 LAB — MAGNESIUM: Magnesium: 1.7 mg/dL (ref 1.7–2.4)

## 2020-05-08 MED ORDER — LACTATED RINGERS IV SOLN
INTRAVENOUS | Status: DC
Start: 2020-05-08 — End: 2020-05-08

## 2020-05-08 MED ORDER — ACETAMINOPHEN 650 MG RE SUPP: 650.0000 mg | Freq: Four times a day (QID) | RECTAL | Status: DC | PRN

## 2020-05-08 MED ORDER — ENOXAPARIN SODIUM 40 MG/0.4ML ~~LOC~~ SOLN
40.0000 mg | SUBCUTANEOUS | Status: DC
  Administered 2020-05-08 – 2020-05-09 (×2): 40 mg via SUBCUTANEOUS
  Filled 2020-05-08 (×2): qty 0.4

## 2020-05-08 MED ORDER — AEROCHAMBER Z-STAT PLUS/MEDIUM MISC
1.0000 | Freq: Once | Status: AC
  Administered 2020-05-08: 1

## 2020-05-08 MED ORDER — LEVOFLOXACIN IN D5W 750 MG/150ML IV SOLN
750.0000 mg | Freq: Once | INTRAVENOUS | Status: AC
Start: 2020-05-08 — End: 2020-05-08
  Administered 2020-05-08: 750 mg via INTRAVENOUS
  Filled 2020-05-08: qty 150

## 2020-05-08 MED ORDER — POTASSIUM CHLORIDE CRYS ER 20 MEQ PO TBCR
40.0000 meq | EXTENDED_RELEASE_TABLET | Freq: Once | ORAL | Status: AC
  Administered 2020-05-08: 40 meq via ORAL
  Filled 2020-05-08: qty 2

## 2020-05-08 MED ORDER — POTASSIUM CHLORIDE IN NACL 20-0.9 MEQ/L-% IV SOLN
INTRAVENOUS | Status: DC
  Filled 2020-05-08: qty 1000

## 2020-05-08 MED ORDER — ALBUTEROL SULFATE HFA 108 (90 BASE) MCG/ACT IN AERS
2.0000 | INHALATION_SPRAY | Freq: Once | RESPIRATORY_TRACT | Status: AC
  Administered 2020-05-08: 2 via RESPIRATORY_TRACT
  Filled 2020-05-08: qty 6.7

## 2020-05-08 MED ORDER — ONDANSETRON HCL 4 MG/2ML IJ SOLN: 4.0000 mg | Freq: Four times a day (QID) | INTRAMUSCULAR | Status: DC | PRN

## 2020-05-08 MED ORDER — ONDANSETRON HCL 4 MG PO TABS: 4.0000 mg | ORAL_TABLET | Freq: Four times a day (QID) | ORAL | Status: DC | PRN

## 2020-05-08 MED ORDER — SODIUM CHLORIDE 0.9 % IV SOLN
2.0000 g | INTRAVENOUS | Status: DC
  Administered 2020-05-09 – 2020-05-10 (×2): 2 g via INTRAVENOUS
  Filled 2020-05-08 (×2): qty 20

## 2020-05-08 MED ORDER — ACETAMINOPHEN 325 MG PO TABS: 650.0000 mg | ORAL_TABLET | Freq: Four times a day (QID) | ORAL | Status: DC | PRN

## 2020-05-08 MED ORDER — ACETAMINOPHEN 325 MG PO TABS
650.0000 mg | ORAL_TABLET | Freq: Once | ORAL | Status: AC
  Administered 2020-05-08: 650 mg via ORAL
  Filled 2020-05-08: qty 2

## 2020-05-08 NOTE — Progress Notes (Signed)
Pharmacy Antibiotic Note  Gabriel Love is a 80 y.o. male admitted on 05/08/2020 with a UTI.  Pharmacy was consulted for levofloxacin dosing for UTI. Pharmacy was asked to investigate pt's PCN allergy and switch his antibiotic therapy from levofloxacin to ceftriaxone if pt able is to tolerate cephalosporins. Pt told RN that his allergy to PCN is a rash; he has had no difficulty breathing or airway issues after taking PCNs. Also, pt rec'd 1 dose of IM ceftriaxone at [facility] in August of 2021; pt told RN that he tolerated the dose of ceftriaxone. Given nature of pt's PCN allergy and ability to tolerate ceftriaxone within the past year, will switch to ceftriaxone therapy to treat the pt's UTI.  WBC 8.4, afebrile at this point, but came in with temp of 103.2 and tachycardia, so ED team is treating pt for urosepsis  Plan: Ceftriaxone 2 gm IV Q 24 hrs (using 2 gm dose at this time for possible urosepsis; may be able to de-escalate to 1 gm dose as more data available) Monitor WBC, temp, clinical improvement, cultures  Height: 5\' 11"  (180.3 cm) Weight: 105.2 kg (232 lb) IBW/kg (Calculated) : 75.3  Temp (24hrs), Avg:100.7 F (38.2 C), Min:98.2 F (36.8 C), Max:103.2 F (39.6 C)  Recent Labs  Lab 05/08/20 1740 05/08/20 1958  WBC 8.4  --   CREATININE 0.75  --   LATICACIDVEN 1.3 0.8    Estimated Creatinine Clearance: 90.9 mL/min (by C-G formula based on SCr of 0.75 mg/dL).    Allergies  Allergen Reactions  . Ivp Dye [Iodinated Diagnostic Agents] Anaphylaxis    Iv dye allergy in 1985, anaphylaxix//a.calhoun- "shocked back to life"  . Penicillins Other (See Comments)    Urticaria Has patient had a PCN reaction causing immediate rash, facial/tongue/throat swelling, SOB or lightheadedness with hypotension: no Has patient had a PCN reaction causing severe rash involving mucus membranes or skin necrosis: no Has patient had a PCN reaction that required hospitalization no Has patient had a PCN  reaction occurring within the last 10 years: yes If all of the above answers are "NO", then may proceed with Cephalosporin use.   Marland Kitchen Lisinopril Cough    Antimicrobials this admission: Levofloxacin IV X 1: 2/17 Ceftriaxone: 2/17 >>  Microbiology results: 2/17 BCx X 1: pending 2/17 UCx:  pending 2/17 COVID: pending  Thank you for allowing pharmacy to be a part of this patient's care.  Gillermina Hu, PharmD, BCPS, [facility] Clinical Pharmacist 05/08/2020 9:40 PM

## 2020-05-08 NOTE — ED Provider Notes (Signed)
[facility] EMERGENCY DEPARTMENT Provider Note   CSN: [id number] Arrival date & time: 05/08/20  1512     History Chief Complaint  Patient presents with  . Emesis    DEMARQUEZ CIOLEK is a 80 y.o. male with a history of morbid obesity, hypertension, hyperlipidemia, depression.  Patient presents with chief complaint of nausea, vomiting, and loose stools x2 days.  Patient reports that he had an episode of vomiting yesterday after eating lunch.  Patient denies any bloody cysts or coffee-ground emesis.  Patient reports that he has had minimal vomiting since then.  She denies any alleviating or aggravating factors for his nausea and vomiting.  Patient also endorses diarrhea.  Patient reports that his stool is loose with no form.  Patient denies any blood or melena in his stool.  Patient denies any alleviating or aggravating factors.  Patient also complains of difficulty walking.  Patient reports that this started yesterday after he started having nausea and vomiting.  Patient reports that he is able to move his feet and legs without difficulty however when he goes to walk they feel heavy.  Patient reports that he suffered a fall earlier today due to this heaviness in his legs.  Patient denies hitting his head or any loss of consciousness in the fall.  Patient denies any neck or back pain, numbness or tingling to extremities, weakness in extremities, anesthesia, bowel or bladder dysfunction.    Patient does endorse shortness of breath.  Patient denies any fevers, chills, chest pain, cough, rhinorrhea, nasal congestion, sore throat, nominal pain, rectal bleeding, dysuria, urinary frequency, lightheadedness, dizziness, or syncopal episodes.  HPI     Past Medical History:  Diagnosis Date  . Abdominal hernia   . Allergy   . Blood transfusion without reported diagnosis 12/2006   At [facility] with hip replacement - 2 units transfused  . Cancer ([place])    skin cancers  . Cataract   . Depression   . DJD  (degenerative joint disease)    shoulders bilateral  . Environmental allergies   . Full dentures   . Hearing loss    bilateral hearing aids  . History of urinary tract infection   . Hyperlipidemia    under control  . Hypertension    "borderline"  . Nonspecific abnormal finding in stool contents   . Other general symptoms(780.99)   . Prostate hypertrophy    sees Dr. Gaynelle Arabian   . Urinary leakage     Patient Active Problem List   Diagnosis Date Noted  . Depression with anxiety 07/24/2019  . Morbid obesity ([place]) 04/04/2018  . Bilateral leg edema 02/07/2017  . Osteoarthritis 01/28/2017  . Urge and stress incontinence 01/28/2017  . History of colonic polyps   . Colon polyp 08/07/2013  . 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 Nonspecific abnormal finding in stool contents 05/29/2013  . Atherosclerotic peripheral vascular disease with intermittent claudication ([place]) 06/14/2012  . Spinal stenosis of lumbar region 06/14/2012  . 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 Hyperlipidemia 11/23/2006  . Essential hypertension 11/23/2006  . HEMATURIA 11/23/2006  . 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    Past Surgical History:  Procedure Laterality Date  . CATARACT EXTRACTION W/PHACO Right 03/25/2014   Procedure: CATARACT EXTRACTION PHACO AND INTRAOCULAR LENS PLACEMENT; CDE:9.76;  Surgeon: Williams Che, MD;  Location: [facility];  Service: Ophthalmology;  Laterality: Right;  . CATARACT EXTRACTION W/PHACO Left 06/24/2014   Procedure: CATARACT EXTRACTION PHACO AND INTRAOCULAR LENS  PLACEMENT (IOC);  Surgeon: Williams Che, MD;  Location: [facility];  Service: Ophthalmology;  Laterality: Left;  CDE:8.45  . CIRCUMCISION N/A 03/26/2016   Procedure: CIRCUMCISION ADULT;  Surgeon: Carolan Clines, MD;  Location: [facility];  Service: Urology;  Laterality: N/A;  . CIRCUMCISION, NON-NEWBORN  1985  . COLONOSCOPY  06-19-13, 03/05/15   per Dr. Deatra Ina, mass at the  ileocecal valve plus several polyps, pathology benign, repeat in one year, polyp 2016   . COLONOSCOPY WITH PROPOFOL N/A 07/29/2015   Procedure: COLONOSCOPY WITH PROPOFOL;  Surgeon: Manus Gunning, MD;  Location: [facility];  Service: Gastroenterology;  Laterality: N/A;  . JOINT REPLACEMENT  12/2006   total left hip per Dr. Percell Miller   . LAPAROSCOPIC PARTIAL COLECTOMY N/A 08/07/2013   Procedure: LAPAROSCOPIC right PARTIAL COLECTOMY;  Surgeon: Leighton Ruff, MD;  Location: [facility];  Service: General;  Laterality: N/A;  . MULTIPLE TOOTH EXTRACTIONS    . PROSTATE SURGERY  03-29-11   had CTT per Dr. Gaynelle Arabian, in office       Family History  Problem Relation Age of Onset  . Hypertension Mother   . Diabetes Mother   . Heart failure Mother        cad/mi with rupture ventricle  . Hyperlipidemia Father   . Colon polyps Father   . Cancer Neg Hx        colon or prostate  . Colon cancer Neg Hx   . Esophageal cancer Neg Hx   . Rectal cancer Neg Hx   . Stomach cancer Neg Hx     Social History   Tobacco Use  . Smoking status: Former Smoker    Packs/day: 1.00    Years: 5.00    Pack years: 5.00    Types: Cigarettes    Quit date: 03/22/1968    Years since quitting: 52.1  . Smokeless tobacco: Former Systems developer    Types: Chew    Quit date: 09/20/2015  . Tobacco comment: occassionally  Substance Use Topics  . Alcohol use: No    Alcohol/week: 0.0 standard drinks  . Drug use: No    Home Medications Prior to Admission medications   Medication Sig Start Date End Date Taking? Authorizing Provider  albuterol (VENTOLIN HFA) 108 (90 Base) MCG/ACT inhaler INHALE 2 PUFFS BY MOUTH EVERY 4 HOURS 10/15/19   Laurey Morale, MD  Ascorbic Acid (VITAMIN C PO) Take by mouth daily. Unsure of dosage.    [provider]  aspirin EC 81 MG tablet Take 81 mg by mouth daily.    [provider]  atorvastatin (LIPITOR) 40 MG tablet Take 1 tablet (40 mg total) by mouth daily. 02/19/19   Laurey Morale, MD  buPROPion (WELLBUTRIN XL) 150 MG 24 hr tablet Take 1 tablet (150 mg total) by mouth daily. 01/21/20   Laurey Morale, MD  clotrimazole-betamethasone (LOTRISONE) cream Apply 1 application topically daily. Applies to affected area. 02/10/15   [provider]  furosemide (LASIX) 40 MG tablet TAKE 1 TABLET(40 MG) BY MOUTH TWICE DAILY 04/16/19   Laurey Morale, MD  LEXAPRO 20 MG tablet Take 1 tablet (20 mg total) by mouth daily. 03/03/20   Laurey Morale, MD  metoprolol succinate (TOPROL-XL) 50 MG 24 hr tablet TAKE 1 TABLET BY MOUTH EVERY DAY 03/31/20   Laurey Morale, MD  Multiple Vitamins-Minerals (CENTRUM ADULTS PO) Take by mouth.    [provider]  mupirocin ointment (BACTROBAN) 2 % Apply topically 2 (two) times  daily. 12/20/18   Laurey Morale, MD  MYRBETRIQ 50 MG TB24 tablet Take 50 mg by mouth daily.  08/25/18   [provider]  oxyCODONE (ROXICODONE) 5 MG immediate release tablet Take 1 tablet (5 mg total) by mouth every 4 (four) hours as needed for severe pain. 10/10/19   Maudie Flakes, MD  potassium chloride SA (KLOR-CON) 20 MEQ tablet Take 1 tablet (20 mEq total) by mouth daily. 05/15/19   Laurey Morale, MD  traMADol-acetaminophen (ULTRACET) 37.5-325 MG tablet Take 1 tablet by mouth every 6 (six) hours as needed. 03/26/16   Carolan Clines, MD    Allergies    Ivp dye [iodinated diagnostic agents], Lisinopril, and Penicillins  Review of Systems   Review of Systems  Constitutional: Negative for chills and fever.  HENT: Negative for congestion, rhinorrhea and sore throat.   Eyes: Negative for visual disturbance.  Respiratory: Positive for shortness of breath. Negative for cough.   Cardiovascular: Negative for chest pain and leg swelling.  Gastrointestinal: Positive for diarrhea, nausea and vomiting. Negative for abdominal distention, abdominal pain, anal bleeding, blood in stool, constipation and rectal pain.  Genitourinary: Negative for difficulty  urinating, dysuria, frequency and hematuria.  Musculoskeletal: Negative for back pain and neck pain.  Skin: Negative for color change and rash.  Neurological: Negative for dizziness, tremors, seizures, syncope, facial asymmetry, speech difficulty, weakness, light-headedness, numbness and headaches.  Psychiatric/Behavioral: Negative for confusion.    Physical Exam Updated Vital Signs BP (!) 143/75 (BP Location: Right Arm)   Pulse (!) 108   Temp (!) 103.2 F (39.6 C) (Oral)   Resp 16   Ht 5\' 11"  (1.803 m)   Wt 105.2 kg   BMI 32.36 kg/m   Physical Exam Vitals and nursing note reviewed.  Constitutional:      General: He is not in acute distress.    Appearance: He is obese. He is not toxic-appearing or diaphoretic.  HENT:     Head: Normocephalic and atraumatic.  Eyes:     General: No scleral icterus.       Right eye: No discharge.        Left eye: No discharge.     Extraocular Movements: Extraocular movements intact.     Pupils: Pupils are equal, round, and reactive to light.  Cardiovascular:     Rate and Rhythm: Tachycardia present.  Pulmonary:     Effort: Pulmonary effort is normal. No tachypnea, bradypnea, accessory muscle usage or respiratory distress.     Breath sounds: Examination of the right-upper field reveals wheezing. Examination of the left-upper field reveals wheezing. Examination of the right-middle field reveals wheezing. Examination of the left-middle field reveals wheezing. Examination of the right-lower field reveals wheezing. Examination of the left-lower field reveals wheezing. Wheezing present. No decreased breath sounds, rhonchi or rales.  Abdominal:     General: Abdomen is protuberant. There is no distension. There are no signs of injury.     Palpations: Abdomen is soft. There is no mass or pulsatile mass.     Tenderness: There is no abdominal tenderness. There is no guarding or rebound.  Musculoskeletal:     Cervical back: Normal range of motion and neck  supple. No deformity, rigidity, tenderness or bony tenderness. No pain with movement, spinous process tenderness or muscular tenderness.     Thoracic back: No deformity, tenderness or bony tenderness.     Lumbar back: No deformity, tenderness or bony tenderness.     Right lower leg: No swelling,  deformity, lacerations, tenderness or bony tenderness. 1+ Edema present.     Left lower leg: No swelling, deformity, lacerations, tenderness or bony tenderness. 1+ Edema present.     Comments: Patient has metal brace to left foot and lower extremity.  Skin:    General: Skin is warm and dry.     Coloration: Skin is not jaundiced or pale.     Findings: No rash.  Neurological:     General: No focal deficit present.     Mental Status: He is alert and oriented to person, place, and time.     GCS: GCS eye subscore is 4. GCS verbal subscore is 5. GCS motor subscore is 6.     Cranial Nerves: No cranial nerve deficit or facial asymmetry.     Sensory: Sensation is intact.     Motor: No weakness, tremor, seizure activity or pronator drift.     Coordination: Finger-Nose-Finger Test normal.     Comments: CN II-XII intact; performed in supine, +5 strength to bilateral upper extremities, +5 strength to dorsiflexion and plantarflexion, patient able to left both legs against gravity and hold each there without difficulty   Psychiatric:        Behavior: Behavior is cooperative.     ED Results / Procedures / Treatments   Labs (all labs ordered are listed, but only abnormal results are displayed) Labs Reviewed  COMPREHENSIVE METABOLIC PANEL - Abnormal; Notable for the following components:      Result Value   Sodium 131 (*)    Potassium 3.1 (*)    Glucose, Bld 102 (*)    Calcium 7.9 (*)    Total Protein 6.3 (*)    Albumin 3.3 (*)    All other components within normal limits  URINALYSIS, ROUTINE W REFLEX MICROSCOPIC - Abnormal; Notable for the following components:   APPearance HAZY (*)    Protein, ur 30  (*)    Leukocytes,Ua LARGE (*)    WBC, UA >50 (*)    Bacteria, UA RARE (*)    All other components within normal limits  SARS CORONAVIRUS 2 (TAT 6-24 HRS)  CULTURE, BLOOD (SINGLE)  URINE CULTURE  LACTIC ACID, PLASMA  CBC WITH DIFFERENTIAL/PLATELET  PROTIME-INR  APTT  LACTIC ACID, PLASMA    EKG None  Radiology CT Abdomen Pelvis Wo Contrast  Result Date: 05/08/2020 CLINICAL DATA:  Nausea and vomiting. Patient reports pain and legs and difficulty walking. Nausea, vomiting, diarrhea. EXAM: CT ABDOMEN AND PELVIS WITHOUT CONTRAST TECHNIQUE: Multidetector CT imaging of the abdomen and pelvis was performed following the standard protocol without IV contrast. COMPARISON:  CT 12/18/2013 FINDINGS: Lower chest: Normal heart size with trace pericardial effusion. There are coronary artery calcifications. No focal airspace disease or pleural effusion. Hepatobiliary: Prominent liver with suspected mild steatosis. No focal lesion. Layering hyperdensity in the gallbladder may be stones or sludge. No pericholecystic fat stranding. No biliary dilatation. Pancreas: No ductal dilatation or inflammation. Spleen: Normal in size without focal abnormality. Adrenals/Urinary Tract: Left greater than right adrenal thickening without adrenal nodule. Symmetric bilateral perinephric edema which has increased from prior exam. No hydronephrosis or renal calculi. No obvious renal lesion on noncontrast exam. Urinary bladder is grossly normal, partially obscured by streak artifact from left hip arthroplasty. Stomach/Bowel: Decompressed stomach. Normal positioning of the duodenum and ligament of Treitz. No small bowel obstruction, inflammation, or evident wall thickening. Broad-based umbilical hernia contains noninflamed and nonobstructed small bowel. Chain sutures at the bases cecum suggest prior appendectomy. Colonic diverticulosis in the  sigmoid colon. No acute diverticulitis. Portions of sigmoid colon are obscured by streak  artifact from left hip arthroplasty. Vascular/Lymphatic: Aortic atherosclerosis and tortuosity. Prominent portal caval node measuring 11 mm, series 3, image 29, unchanged. There are multiple prominent and mildly enlarged central mesenteric nodes in the small bowel mesentery with adjacent edema. Prominent retroperitoneal lymph nodes including an aortocaval node measuring 10 mm, series 3, image 40. Reproductive: Prostate gland is primarily obscured by streak artifact from left hip arthroplasty. Other: No ascites or free air. Broad-based umbilical hernia contains fat and nonobstructed nor inflamed small bowel. Musculoskeletal: Diffuse degenerative change throughout the spine. Modic endplate changes noted at L2-L3, progressed from prior. Left hip arthroplasty. No acute osseous abnormality or focal bone lesion. IMPRESSION: 1. Multiple prominent and mildly enlarged central mesenteric with associated mesenteric edema. 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 There also prominent retroperitoneal lymph nodes are likely reactive. 2. Layering hyperdensity in the gallbladder may be stones or sludge. No CT findings of acute cholecystitis. 3. Colonic diverticulosis without acute inflammation. 4. Broad-based umbilical hernia contains fat and nonobstructed small bowel. 5. Diffuse degenerative change in the spine. Aortic Atherosclerosis (ICD10-I70.0). Electronically Signed   By: Keith Rake M.D.   On: 05/08/2020 20:39   CT Head Wo Contrast  Result Date: 05/08/2020 CLINICAL DATA:  Lower extremity pain, difficulty ambulating, nausea and vomiting EXAM: CT HEAD WITHOUT CONTRAST TECHNIQUE: Contiguous axial images were obtained from the base of the skull through the vertex without intravenous contrast. COMPARISON:  None. FINDINGS: Brain: No acute infarct or hemorrhage. Lateral ventricles and midline structures are unremarkable. No acute extra-axial fluid collections. No mass effect. Vascular: No  hyperdense vessel or unexpected calcification. Skull: Normal. Negative for fracture or focal lesion. Sinuses/Orbits: No acute finding. Other: None. IMPRESSION: 1. No acute intracranial process. Electronically Signed   By: Randa Ngo M.D.   On: 05/08/2020 20:32   DG Chest Port 1 View  Result Date: 05/08/2020 CLINICAL DATA:  Possible sepsis. EXAM: PORTABLE CHEST 1 VIEW COMPARISON:  02/14/2017 FINDINGS: 1655 hours. Cardiopericardial silhouette is at upper limits of normal for size. Interstitial markings are diffusely coarsened with chronic features. The lungs are clear without focal pneumonia, edema, pneumothorax or pleural effusion. The visualized bony structures of the thorax show no acute abnormality. Telemetry leads overlie the chest. IMPRESSION: No active disease. Electronically Signed   By: Misty Stanley M.D.   On: 05/08/2020 17:08    Procedures Procedures   Medications Ordered in ED Medications  acetaminophen (TYLENOL) tablet 650 mg (650 mg Oral Given 05/08/20 1751)  albuterol (VENTOLIN HFA) 108 (90 Base) MCG/ACT inhaler 2 puff (2 puffs Inhalation Given 05/08/20 1748)  aerochamber Z-Stat Plus/medium 1 each (1 each Other Given 05/08/20 1748)    ED Course  I have reviewed the triage vital signs and the nursing notes.  Pertinent labs & imaging results that were available during my care of the patient were reviewed by me and considered in my medical decision making (see chart for details).    MDM Rules/Calculators/A&P                          80 year old male no acute distress, nontoxic-appearing.  Patient presents with chief complaint of nausea, vomiting, diarrhea.  Reports that his symptoms began yesterday.  He also endorses shortness of breath and difficulty walking due to his legs feeling "heavy."    Patient's abdomen is soft, nondistended, nontender no mass, pulsatile mass.  No neurological deficits noted on physical exam.  Noted to have wheezing in all lung fields.  Patient is  noted to be febrile at 103.58F and tachycardic at 108.    Due to patient's fever and tach cardia concern for sepsis.   EKG, chest x-ray, blood culture, lactic acid, CMP, CBC, APTT, INR, UA, COVID-19 swab ordered.  Chest x-ray showed no acute cardiopulmonary disease. CBC, APTT, INR,  Unremarkable. Lactic acid within normal limit at 1.3. CMP shows Sodium, potassium, calcium, albumin all slightly decreased.  Glucose 102.  UA shows leukocytes large, WBC greater than 50, bacteria rare.  Urine culture pending.  Patient is at increased risk for UTI due to his history of BPH.  Patient has allergy to iodine contrast dye and therefore noncontrast CT scans were ordered. Noncontrast CT scan of abdomen/pelvis ordered to evaluate potential cause for patient's nausea, vomiting, and diarrhea.  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  Layering hyperdensity in the gallbladder may be stones or sludge; no acute cholecystitis.  Colonic diverticulosis without acute inflammation.  Broad-based umbilical hernia containing fat and nonobstructed small bowel.  Noncontrast head CT was ordered due to patient's report of falling earlier today.  Patient is not on any blood thinners.  Scan showed no acute intracranial process.  Will consult hospitalist for admission of patient due to urinary tract infection causing possible sepsis.  2120 spoke with hospitalist Dr. Olevia Bowens who will see the patient for admission.  Patient is agreeable to this plan.  Patient was discussed with and evaluated by Dr. Rogene Houston.   Final Clinical Impression(s) / ED Diagnoses Final diagnoses:  Nausea vomiting and diarrhea  Urinary tract infection without hematuria, site unspecified    Rx / DC Orders ED Discharge Orders    None       Loni Beckwith, PA-C 05/09/20 0157    Fredia Sorrow, MD 05/28/20 [phone]

## 2020-05-08 NOTE — Progress Notes (Signed)
Code Sepsis initiated @ 2106, [place] following.

## 2020-05-08 NOTE — ED Triage Notes (Signed)
Pt arrived to ED with EMS. C/O nausea , vomiting, and loose stools x 2 days. Yesterday wife gave him imodium and today it is worse. Pt is on RA at home and just for comfort EMS placed 2 lpm nasal cannula.

## 2020-05-08 NOTE — H&P (Signed)
History and Physical    Gabriel Love:[id number] DOB: 1940/08/07 DOA: 05/08/2020  PCP: Laurey Morale, MD   Patient coming from: Home.  I have personally briefly reviewed patient's old medical records in [place]  Chief Complaint: Vomiting and diarrhea.  HPI: Gabriel Love is a 80 y.o. male with medical history significant of abdominal hernia, unspecified allergies, history of blood transfusions, unspecified skin cancer, cataract, depression with anxiety, bilateral DJD of shoulders, grade 1 diastolic dysfunction, hearing loss, colon polyps, hyperlipidemia, hypertension, peripheral vascular disease, BPH, history of UTI who is coming to the emergency department due to an episode of emesis after he ate lunch yesterday associated with over 20 episodes of loose stools.  Denies constipation, melena or hematochezia.  No dysuria, frequency or hematuria.  He denies fever, was febrile on arrival to the emergency department.  No headaches, chills, night sweats, rhinorrhea, sore throat, wheezing or hemoptysis.  He denies chest pain, palpitations, diaphoresis, but has felt lightheaded.  No recent pitting edema of the lower extremities, orthopnea or PND.  He denies polyuria, polydipsia, polyphagia or blurred vision.  ED Course: Initial vital signs were temperature 103.2 F, pulse 108, respirations 16, BP 143/75 mmHg and O2 sat 92% on room air.  The patient received Levaquin 750 mg IVP in the emergency department.  Labwork: Urinalysis show proteinuria 30 mg/dL, large leukocyte esterase more than 50 WBC per hpf and rare bacteria.  CBC showed a white count of 8.4 with 82% neutrophils, 11% lymphocytes and 6% monocytes.  Hemoglobin was 14.5 g/dL platelets 158.  PT, INR and PTT were normal.  Lactic acid was 1.3 and then 0.80 mmol/L.  Sodium 131 and potassium 3.1 mmol/L.  Renal function was normal.  Glucose 102 and calcium 7.9 mg/dL.  Total protein was 6.3 and albumin 3.3 g/dL.  The rest of the LFTs are  normal.  Magnesium was 1.7 and phosphorus 2.4 mg/dL.  Imaging: Single view chest radiograph did not show any active cardiopulmonary disease.  CT head without contrast did not show any acute abnormalities.  CT abdomen/pelvis with contrast showed multiple prominent and mildly enlarged central mesenteric nodes and prominent retroperitoneal lymph nodes including an aortocaval node measuring 10 mm.  Diverticulosis without inflammation.  There is a broad-based umbilical hernia containing fat and nonobstructed small bowel.  Please see images in full radiology report for further detail.  Review of Systems: As per HPI otherwise all other systems reviewed and are negative.  Past Medical History:  Diagnosis Date  . Abdominal hernia   . Allergy   . Blood transfusion without reported diagnosis 12/2006   At [facility] with hip replacement - 2 units transfused  . Cancer ([place])    skin cancers  . Cataract   . Depression   . DJD (degenerative joint disease)    shoulders bilateral  . Environmental allergies   . Full dentures   . Grade I diastolic dysfunction 9/93/7169  . Hearing loss    bilateral hearing aids  . History of colonic polyps   . History of urinary tract infection   . Hyperlipidemia    under control  . Hypertension    "borderline"  . Nonspecific abnormal finding in stool contents   . Other general symptoms(780.99)   . Prostate hypertrophy    sees Dr. Gaynelle Arabian   . Urinary leakage    Past Surgical History:  Procedure Laterality Date  . CATARACT EXTRACTION W/PHACO Right 03/25/2014   Procedure: CATARACT EXTRACTION PHACO AND INTRAOCULAR LENS PLACEMENT;  CDE:9.76;  Surgeon: Williams Che, MD;  Location: [facility];  Service: Ophthalmology;  Laterality: Right;  . CATARACT EXTRACTION W/PHACO Left 06/24/2014   Procedure: CATARACT EXTRACTION PHACO AND INTRAOCULAR LENS PLACEMENT (IOC);  Surgeon: Williams Che, MD;  Location: [facility];  Service: Ophthalmology;  Laterality: Left;  CDE:8.45  .  CIRCUMCISION N/A 03/26/2016   Procedure: CIRCUMCISION ADULT;  Surgeon: Carolan Clines, MD;  Location: [facility];  Service: Urology;  Laterality: N/A;  . CIRCUMCISION, NON-NEWBORN  1985  . COLONOSCOPY  06-19-13, 03/05/15   per Dr. Deatra Ina, mass at the ileocecal valve plus several polyps, pathology benign, repeat in one year, polyp 2016   . COLONOSCOPY WITH PROPOFOL N/A 07/29/2015   Procedure: COLONOSCOPY WITH PROPOFOL;  Surgeon: Manus Gunning, MD;  Location: [facility];  Service: Gastroenterology;  Laterality: N/A;  . JOINT REPLACEMENT  12/2006   total left hip per Dr. Percell Miller   . LAPAROSCOPIC PARTIAL COLECTOMY N/A 08/07/2013   Procedure: LAPAROSCOPIC right PARTIAL COLECTOMY;  Surgeon: Leighton Ruff, MD;  Location: [facility];  Service: General;  Laterality: N/A;  . MULTIPLE TOOTH EXTRACTIONS    . PROSTATE SURGERY  03-29-11   had CTT per Dr. Gaynelle Arabian, in office   Social History  reports that he quit smoking about 52 years ago. His smoking use included cigarettes. He has a 5.00 pack-year smoking history. He quit smokeless tobacco use about 4 years ago.  His smokeless tobacco use included chew. He reports that he does not drink alcohol and does not use drugs.  Allergies  Allergen Reactions  . Ivp Dye [Iodinated Diagnostic Agents] Anaphylaxis    Iv dye allergy in 1985, anaphylaxix//a.calhoun- "shocked back to life"  . Penicillins Other (See Comments)    Urticaria Has patient had a PCN reaction causing immediate rash, facial/tongue/throat swelling, SOB or lightheadedness with hypotension: no Has patient had a PCN reaction causing severe rash involving mucus membranes or skin necrosis: no Has patient had a PCN reaction that required hospitalization no Has patient had a PCN reaction occurring within the last 10 years: yes If all of the above answers are "NO", then may proceed with Cephalosporin use.   Marland Kitchen Lisinopril Cough   Family History  Problem Relation Age of Onset  . Hypertension Mother    . Diabetes Mother   . Heart failure Mother        cad/mi with rupture ventricle  . Hyperlipidemia Father   . Colon polyps Father   . Cancer Neg Hx        colon or prostate  . Colon cancer Neg Hx   . Esophageal cancer Neg Hx   . Rectal cancer Neg Hx   . Stomach cancer Neg Hx    Prior to Admission medications   Medication Sig Start Date End Date Taking? Authorizing Provider  albuterol (VENTOLIN HFA) 108 (90 Base) MCG/ACT inhaler INHALE 2 PUFFS BY MOUTH EVERY 4 HOURS Patient taking differently: Inhale 2 puffs into the lungs every 4 (four) hours as needed for shortness of breath or wheezing. 10/15/19  Yes Laurey Morale, MD  Ascorbic Acid (VITAMIN C PO) Take 1 tablet by mouth daily.   Yes [provider]  aspirin EC 81 MG tablet Take 81 mg by mouth daily.   Yes [provider]  atorvastatin (LIPITOR) 40 MG tablet Take 1 tablet (40 mg total) by mouth daily. 02/19/19  Yes Laurey Morale, MD  buPROPion (WELLBUTRIN XL) 150 MG 24 hr tablet Take 1 tablet (150 mg total)  by mouth daily. 01/21/20  Yes Laurey Morale, MD  clotrimazole-betamethasone (LOTRISONE) cream Apply 1 application topically daily. Applies to affected area. 02/10/15  Yes [provider]  furosemide (LASIX) 40 MG tablet TAKE 1 TABLET(40 MG) BY MOUTH TWICE DAILY Patient taking differently: Take 40 mg by mouth 2 (two) times daily. 04/16/19  Yes Laurey Morale, MD  LEXAPRO 20 MG tablet Take 1 tablet (20 mg total) by mouth daily. 03/03/20  Yes Laurey Morale, MD  metoprolol succinate (TOPROL-XL) 50 MG 24 hr tablet TAKE 1 TABLET BY MOUTH EVERY DAY 03/31/20  Yes Laurey Morale, MD  Multiple Vitamins-Minerals (CENTRUM ADULTS PO) Take 1 tablet by mouth daily.   Yes [provider]  mupirocin ointment (BACTROBAN) 2 % Apply topically 2 (two) times daily. 12/20/18  Yes Laurey Morale, MD  potassium chloride SA (KLOR-CON) 20 MEQ tablet Take 1 tablet (20 mEq total) by mouth daily. 05/15/19  Yes Laurey Morale, MD    Physical Exam: Vitals:   05/08/20 1930 05/08/20 2000 05/08/20 2004 05/08/20 2139  BP: (!) 88/56 (!) 79/61 (!) 106/58   Pulse: 99 (!) 108 100   Resp: 20 (!) 23 (!) 21   Temp:    98.2 F (36.8 C)  TempSrc:    Oral  SpO2: 93% 93% 93%   Weight:      Height:       Constitutional: Looks acutely ill. Eyes: PERRL, lids and conjunctivae mildly injected. ENMT: Mucous membranes and lips are very dry.  Posterior pharynx clear of any exudate or lesions. Neck: normal, supple, no masses, no thyromegaly Respiratory: Decreased breath sounds in bases, otherwise clear to auscultation bilaterally, no wheezing, no crackles. Normal respiratory effort. No accessory muscle use.  Cardiovascular: Tachycardic in the 110s and 120s with a regular rhythm, no murmurs / rubs / gallops.  Stage II lymphedema.  No lower extremity pitting edema. 2+ pedal pulses. No carotid bruits.  Abdomen: Obese, no distention.  Bowel sounds positive.  Soft, no tenderness, no masses palpated. No hepatosplenomegaly. Musculoskeletal: Mild generalized weakness.  No clubbing / cyanosis. Good ROM, no contractures. Normal muscle tone.  Skin: Actinic keratosis lesions are present on both forearms. Neurologic: CN 2-12 grossly intact. Sensation intact, DTR normal. Strength 5/5 in all 4.  Psychiatric: Normal judgment and insight. Alert and oriented x 3. Normal mood.   Labs on Admission: I have personally reviewed following labs and imaging studies  CBC: Recent Labs  Lab 05/08/20 1740  WBC 8.4  NEUTROABS 6.9  HGB 14.5  HCT 45.7  MCV 92.1  PLT 347   Basic Metabolic Panel: Recent Labs  Lab 05/08/20 1740  NA 131*  K 3.1*  CL 101  CO2 25  GLUCOSE 102*  BUN 14  CREATININE 0.75  CALCIUM 7.9*   GFR: Estimated Creatinine Clearance: 90.9 mL/min (by C-G formula based on SCr of 0.75 mg/dL).  Liver Function Tests: Recent Labs  Lab 05/08/20 1740  AST 34  ALT 26  ALKPHOS 73  BILITOT 0.9  PROT 6.3*  ALBUMIN 3.3*   Urine  analysis:    Component Value Date/Time   COLORURINE YELLOW 05/08/2020 1614   APPEARANCEUR HAZY (A) 05/08/2020 1614   LABSPEC 1.015 05/08/2020 1614   PHURINE 5.0 05/08/2020 1614   GLUCOSEU NEGATIVE 05/08/2020 1614        HGBUR NEGATIVE 05/08/2020 [place] 05/08/2020 [place] 05/08/2020 1614   PROTEINUR 30 (A) 05/08/2020  [place] 05/08/2020 1614   LEUKOCYTESUR LARGE (A) 05/08/2020 1614   Radiological Exams on Admission: CT Abdomen Pelvis Wo Contrast  Result Date: 05/08/2020 CLINICAL DATA:  Nausea and vomiting. Patient reports pain and legs and difficulty walking. Nausea, vomiting, diarrhea. EXAM: CT ABDOMEN AND PELVIS WITHOUT CONTRAST TECHNIQUE: Multidetector CT imaging of the abdomen and pelvis was performed following the standard protocol without IV contrast. COMPARISON:  CT 12/18/2013 FINDINGS: Lower chest: Normal heart size with trace pericardial effusion. There are coronary artery calcifications. No focal airspace disease or pleural effusion. Hepatobiliary: Prominent liver with suspected mild steatosis. No focal lesion. Layering hyperdensity in the gallbladder may be stones or sludge. No pericholecystic fat stranding. No biliary dilatation. Pancreas: No ductal dilatation or inflammation. Spleen: Normal in size without focal abnormality. Adrenals/Urinary Tract: Left greater than right adrenal thickening without adrenal nodule. Symmetric bilateral perinephric edema which has increased from prior exam. No hydronephrosis or renal calculi. No obvious renal lesion on noncontrast exam. Urinary bladder is grossly normal, partially obscured by streak artifact from left hip arthroplasty. Stomach/Bowel: Decompressed stomach. Normal positioning of the duodenum and ligament of Treitz. No small bowel obstruction, inflammation, or evident wall thickening. Broad-based umbilical hernia contains noninflamed and nonobstructed small bowel. Chain  sutures at the bases cecum suggest prior appendectomy. Colonic diverticulosis in the sigmoid colon. No acute diverticulitis. Portions of sigmoid colon are obscured by streak artifact from left hip arthroplasty. Vascular/Lymphatic: Aortic atherosclerosis and tortuosity. Prominent portal caval node measuring 11 mm, series 3, image 29, unchanged. There are multiple prominent and mildly enlarged central mesenteric nodes in the small bowel mesentery with adjacent edema. Prominent retroperitoneal lymph nodes including an aortocaval node measuring 10 mm, series 3, image 40. Reproductive: Prostate gland is primarily obscured by streak artifact from left hip arthroplasty. Other: No ascites or free air. Broad-based umbilical hernia contains fat and nonobstructed nor inflamed small bowel. Musculoskeletal: Diffuse degenerative change throughout the spine. Modic endplate changes noted at L2-L3, progressed from prior. Left hip arthroplasty. No acute osseous abnormality or focal bone lesion. IMPRESSION: 1. Multiple prominent and mildly enlarged central mesenteric with associated mesenteric edema. 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 There also prominent retroperitoneal lymph nodes are likely reactive. 2. Layering hyperdensity in the gallbladder may be stones or sludge. No CT findings of acute cholecystitis. 3. Colonic diverticulosis without acute inflammation. 4. Broad-based umbilical hernia contains fat and nonobstructed small bowel. 5. Diffuse degenerative change in the spine. Aortic Atherosclerosis (ICD10-I70.0). Electronically Signed   By: Keith Rake M.D.   On: 05/08/2020 20:39   CT Head Wo Contrast  Result Date: 05/08/2020 CLINICAL DATA:  Lower extremity pain, difficulty ambulating, nausea and vomiting EXAM: CT HEAD WITHOUT CONTRAST TECHNIQUE: Contiguous axial images were obtained from the base of the skull through the vertex without intravenous contrast. COMPARISON:  None.  FINDINGS: Brain: No acute infarct or hemorrhage. Lateral ventricles and midline structures are unremarkable. No acute extra-axial fluid collections. No mass effect. Vascular: No hyperdense vessel or unexpected calcification. Skull: Normal. Negative for fracture or focal lesion. Sinuses/Orbits: No acute finding. Other: None. IMPRESSION: 1. No acute intracranial process. Electronically Signed   By: Randa Ngo M.D.   On: 05/08/2020 20:32   DG Chest Port 1 View  Result Date: 05/08/2020 CLINICAL DATA:  Possible sepsis. EXAM: PORTABLE CHEST 1 VIEW COMPARISON:  02/14/2017 FINDINGS: 1655 hours. Cardiopericardial silhouette is at upper limits of normal for size.  Interstitial markings are diffusely coarsened with chronic features. The lungs are clear without focal pneumonia, edema, pneumothorax or pleural effusion. The visualized bony structures of the thorax show no acute abnormality. Telemetry leads overlie the chest. IMPRESSION: No active disease. Electronically Signed   By: Misty Stanley M.D.   On: 05/08/2020 17:08    EKG: Independently reviewed.  Vent. rate 107 BPM PR interval * ms QRS duration 87 ms QT/QTc 319/426 ms P-R-T axes 15 26 50 Sinus tachycardia Low voltage, precordial leads  Assessment/Plan Principal Problem:   Sepsis secondary to UTI POA ([place])   Acute gastroenteritis Initially febrile and hypotensive. He is still tachycardic. Admit to telemetry/inpatient. Continue IV fluids. Continue ceftriaxone per pharmacy. Follow blood culture and sensitivity. Follow-up urine culture and sensitivity. I added metronidazole for better intra-abdominal coverage.  Active Problems:   Hyponatremia Due to GI losses and diuretic. Diuretic has been held. Continue IVF. Follow-up sodium level.    Hypokalemia Replacing. Follow-up potassium level. Magnesium has been supplemented.    Hypocalcemia Having mild hypophosphatemia as well. Recheck calcium level in AM. Check vitamin D level.     Hypophosphatemia Replacing.    Hyperlipidemia Continue atorvastatin 40 mg p.o. daily.    Hypoalbuminemia   Mild protein malnutrition ([place]) Nutritional services evaluation.    Essential hypertension Hold antihypertensives. Monitor blood pressure.    Atherosclerotic peripheral vascular disease with intermittent claudication (HCC) Continue aspirin and atorvastatin.    Depression with anxiety Continue escitalopram 20 mg p.o. daily. Continue bupropion XL 150 mg p.o. daily.    Grade I diastolic dysfunction No clinical signs of decompensation. Beta-blocker and diuretic held due to hypotension    Class 1 obesity BMI is 32.36 kg/m. Lifestyle modifications. Follow-up with PCP.   DVT prophylaxis: Lovenox SQ. Code Status:   Full code. Family Communication: Disposition Plan:   Patient is from:  Home.  Anticipated DC to:  Home.  Anticipated DC date:  05/10/2020 or 05/11/2020.  Anticipated DC barriers: Clinical status.  Consults called: Admission status:  Inpatient/stepdown.   Severity of Illness: High due to UTI sepsis presenting with hypotension and fever.  The patient will need to remain in the hospital for IV antibiotic therapy, IV hydration with close cardiovascular status monitoring.   Reubin Milan MD [facility]  How to contact the [facility] Attending or Consulting provider [place] or covering provider during after hours [place], for this patient?   1. Check the care team in [facility] and look for a) attending/consulting [facility] provider listed and b) the [facility] team listed 2. Log into [email or web address] and use [place]'s universal password to access. If you do not have the password, please contact the hospital operator. 3. Locate the [facility] provider you are looking for under [facility] and page to a number that you can be directly reached. 4. If you still have difficulty reaching the provider, please page the [facility] (Director on Call) for the Hospitalists listed on amion for  assistance.  05/08/2020, 9:45 PM   This document was prepared using Dragon voice recognition software and may contain some unintended transcription errors.

## 2020-05-08 NOTE — ED Notes (Signed)
Pt bed linen changed 

## 2020-05-09 ENCOUNTER — Encounter (HOSPITAL_COMMUNITY): Payer: Self-pay | Admitting: Internal Medicine

## 2020-05-09 DIAGNOSIS — E876 Hypokalemia: Secondary | ICD-10-CM

## 2020-05-09 DIAGNOSIS — N390 Urinary tract infection, site not specified: Secondary | ICD-10-CM | POA: Diagnosis not present

## 2020-05-09 DIAGNOSIS — I70219 Atherosclerosis of native arteries of extremities with intermittent claudication, unspecified extremity: Secondary | ICD-10-CM | POA: Diagnosis not present

## 2020-05-09 DIAGNOSIS — E669 Obesity, unspecified: Secondary | ICD-10-CM

## 2020-05-09 DIAGNOSIS — I10 Essential (primary) hypertension: Secondary | ICD-10-CM

## 2020-05-09 DIAGNOSIS — K529 Noninfective gastroenteritis and colitis, unspecified: Secondary | ICD-10-CM

## 2020-05-09 DIAGNOSIS — F418 Other specified anxiety disorders: Secondary | ICD-10-CM

## 2020-05-09 DIAGNOSIS — E785 Hyperlipidemia, unspecified: Secondary | ICD-10-CM

## 2020-05-09 DIAGNOSIS — I5189 Other ill-defined heart diseases: Secondary | ICD-10-CM

## 2020-05-09 DIAGNOSIS — A419 Sepsis, unspecified organism: Secondary | ICD-10-CM | POA: Diagnosis not present

## 2020-05-09 DIAGNOSIS — E871 Hypo-osmolality and hyponatremia: Secondary | ICD-10-CM

## 2020-05-09 LAB — CBC WITH DIFFERENTIAL/PLATELET
Abs Immature Granulocytes: 0.04 10*3/uL (ref 0.00–0.07)
Basophils Absolute: 0 10*3/uL (ref 0.0–0.1)
Basophils Relative: 0 %
Eosinophils Absolute: 0 10*3/uL (ref 0.0–0.5)
Eosinophils Relative: 0 %
HCT: 47.1 % (ref 39.0–52.0)
Hemoglobin: 14.7 g/dL (ref 13.0–17.0)
Immature Granulocytes: 1 %
Lymphocytes Relative: 19 %
Lymphs Abs: 1.4 10*3/uL (ref 0.7–4.0)
MCH: 28.9 pg (ref 26.0–34.0)
MCHC: 31.2 g/dL (ref 30.0–36.0)
MCV: 92.7 fL (ref 80.0–100.0)
Monocytes Absolute: 0.6 10*3/uL (ref 0.1–1.0)
Monocytes Relative: 9 %
Neutro Abs: 5.3 10*3/uL (ref 1.7–7.7)
Neutrophils Relative %: 71 %
Platelets: 126 10*3/uL — ABNORMAL LOW (ref 150–400)
RBC: 5.08 MIL/uL (ref 4.22–5.81)
RDW: 14.6 % (ref 11.5–15.5)
WBC: 7.4 10*3/uL (ref 4.0–10.5)
nRBC: 0 % (ref 0.0–0.2)

## 2020-05-09 LAB — GLUCOSE, CAPILLARY: Glucose-Capillary: 77 mg/dL (ref 70–99)

## 2020-05-09 LAB — COMPREHENSIVE METABOLIC PANEL
ALT: 23 U/L (ref 0–44)
AST: 30 U/L (ref 15–41)
Albumin: 2.8 g/dL — ABNORMAL LOW (ref 3.5–5.0)
Alkaline Phosphatase: 64 U/L (ref 38–126)
Anion gap: 8 (ref 5–15)
BUN: 12 mg/dL (ref 8–23)
CO2: 23 mmol/L (ref 22–32)
Calcium: 8.2 mg/dL — ABNORMAL LOW (ref 8.9–10.3)
Chloride: 105 mmol/L (ref 98–111)
Creatinine, Ser: 0.65 mg/dL (ref 0.61–1.24)
GFR, Estimated: >60 mL/min (ref >60)
Glucose, Bld: 87 mg/dL (ref 70–99)
Potassium: 3.7 mmol/L (ref 3.5–5.1)
Sodium: 136 mmol/L (ref 135–145)
Total Bilirubin: 0.9 mg/dL (ref 0.3–1.2)
Total Protein: 5.7 g/dL — ABNORMAL LOW (ref 6.5–8.1)

## 2020-05-09 LAB — SARS CORONAVIRUS 2 (TAT 6-24 HRS): SARS Coronavirus 2: NEGATIVE

## 2020-05-09 LAB — VITAMIN D 25 HYDROXY (VIT D DEFICIENCY, FRACTURES): Vit D, 25-Hydroxy: 44.91 ng/mL (ref 30–100)

## 2020-05-09 MED ORDER — METOPROLOL TARTRATE 25 MG PO TABS
25.0000 mg | ORAL_TABLET | Freq: Two times a day (BID) | ORAL | Status: DC
Start: 2020-05-09 — End: 2020-05-10
  Administered 2020-05-09 – 2020-05-10 (×3): 25 mg via ORAL
  Filled 2020-05-09 (×3): qty 1

## 2020-05-09 MED ORDER — K PHOS MONO-SOD PHOS DI & MONO 155-852-130 MG PO TABS
500.0000 mg | ORAL_TABLET | Freq: Two times a day (BID) | ORAL | Status: AC
  Administered 2020-05-09 (×3): 500 mg via ORAL
  Filled 2020-05-09 (×3): qty 2

## 2020-05-09 MED ORDER — ALBUTEROL SULFATE HFA 108 (90 BASE) MCG/ACT IN AERS
2.0000 | INHALATION_SPRAY | RESPIRATORY_TRACT | Status: DC | PRN
  Administered 2020-05-09: 2 via RESPIRATORY_TRACT
  Filled 2020-05-09: qty 6.7

## 2020-05-09 MED ORDER — ATORVASTATIN CALCIUM 40 MG PO TABS
40.0000 mg | ORAL_TABLET | Freq: Every day | ORAL | Status: DC
  Administered 2020-05-09 – 2020-05-10 (×2): 40 mg via ORAL
  Filled 2020-05-09 (×2): qty 1

## 2020-05-09 MED ORDER — POTASSIUM CHLORIDE IN NACL 20-0.9 MEQ/L-% IV SOLN: INTRAVENOUS | Status: DC

## 2020-05-09 MED ORDER — LACTATED RINGERS IV BOLUS
1000.0000 mL | INTRAVENOUS | Status: AC
  Administered 2020-05-09 (×2): 1000 mL via INTRAVENOUS

## 2020-05-09 MED ORDER — ASPIRIN EC 81 MG PO TBEC
81.0000 mg | DELAYED_RELEASE_TABLET | Freq: Every day | ORAL | Status: DC
  Administered 2020-05-09 – 2020-05-10 (×2): 81 mg via ORAL
  Filled 2020-05-09 (×2): qty 1

## 2020-05-09 MED ORDER — LACTATED RINGERS IV BOLUS: 1000.0000 mL | Freq: Once | INTRAVENOUS | Status: DC

## 2020-05-09 MED ORDER — BUPROPION HCL ER (XL) 150 MG PO TB24
150.0000 mg | ORAL_TABLET | Freq: Every day | ORAL | Status: DC
  Administered 2020-05-09 – 2020-05-10 (×2): 150 mg via ORAL
  Filled 2020-05-09 (×2): qty 1

## 2020-05-09 MED ORDER — MAGNESIUM SULFATE 2 GM/50ML IV SOLN
2.0000 g | Freq: Once | INTRAVENOUS | Status: AC
  Administered 2020-05-09: 2 g via INTRAVENOUS
  Filled 2020-05-09: qty 50

## 2020-05-09 MED ORDER — METRONIDAZOLE IN NACL 5-0.79 MG/ML-% IV SOLN
500.0000 mg | Freq: Three times a day (TID) | INTRAVENOUS | Status: DC
  Administered 2020-05-09 – 2020-05-10 (×5): 500 mg via INTRAVENOUS
  Filled 2020-05-09 (×6): qty 100

## 2020-05-09 MED ORDER — ESCITALOPRAM OXALATE 10 MG PO TABS
20.0000 mg | ORAL_TABLET | Freq: Every day | ORAL | Status: DC
  Administered 2020-05-09 – 2020-05-10 (×2): 20 mg via ORAL
  Filled 2020-05-09 (×2): qty 2

## 2020-05-09 MED ORDER — IPRATROPIUM-ALBUTEROL 20-100 MCG/ACT IN AERS: 2.0000 | INHALATION_SPRAY | Freq: Four times a day (QID) | RESPIRATORY_TRACT | Status: DC | PRN

## 2020-05-09 NOTE — Progress Notes (Signed)
Patient's blood sugar was 77 tonight. Gave a regular soda (8oz) and 4 saltine crackers.

## 2020-05-09 NOTE — Care Management Important Message (Signed)
Important Message  Patient Details  Name: Gabriel Love MRN: [id number] Date of Birth: 02-23-1941   Medicare Important Message Given:  Yes     Tommy Medal 05/09/2020, 1:26 PM

## 2020-05-09 NOTE — Progress Notes (Signed)
PROGRESS NOTE    Gabriel Love  ZOX:[id number] DOB: 1941-02-12 DOA: 05/08/2020 PCP: Gabriel Morale, MD   Chief Complaint  Patient presents with  . Emesis    Brief admission narrative:  As per H&P written by Dr. Olevia Bowens on 05/08/2020 Gabriel Love is a 80 y.o. male with medical history significant of abdominal hernia, unspecified allergies, history of blood transfusions, unspecified skin cancer, cataract, depression with anxiety, bilateral DJD of shoulders, grade 1 diastolic dysfunction, hearing loss, colon polyps, hyperlipidemia, hypertension, peripheral vascular disease, BPH, history of UTI who is coming to the emergency department due to an episode of emesis after he ate lunch yesterday associated with over 20 episodes of loose stools.  Denies constipation, melena or hematochezia.  No dysuria, frequency or hematuria.  He denies fever, was febrile on arrival to the emergency department.  No headaches, chills, night sweats, rhinorrhea, sore throat, wheezing or hemoptysis.  He denies chest pain, palpitations, diaphoresis, but has felt lightheaded.  No recent pitting edema of the lower extremities, orthopnea or PND.  He denies polyuria, polydipsia, polyphagia or blurred vision.  ED Course: Initial vital signs were temperature 103.2 F, pulse 108, respirations 16, BP 143/75 mmHg and O2 sat 92% on room air.  The patient received Levaquin 750 mg IVP in the emergency department.  You Assessment & Plan: 1-sepsis secondary to UTI [facility]) -Patient met criteria for sepsis on admission -Currently sepsis features improved/resolved -Continue current antibiotics and follow culture results -Patient denies dysuria and is no complaining of nausea or vomiting currently.  2-hypokalemia/hyponatremia/mild dehydration -In the setting of GI losses (diarrhea) and chronic use of diuretics. -Electrolytes repleted and gentle fluid resuscitation provided -Will continue to follow electrolytes  trend.  3-Hyperlipidemia -Continue the use of Lipitor.  4-Essential hypertension -Holding Lasix in the setting of sepsis and mild dehydration. -Continue the use of Lopressor -Follow vital signs.  5-Atherosclerotic peripheral vascular disease with intermittent claudication (HCC) -Continue statins and aspirin -Patient advised to pace himself while performing activities.  6-Depression with anxiety -No suicidal ideation hallucination -Continue the use of Wellbutrin and Lexapro  7-Grade I diastolic dysfunction -Compensated -Continue to follow daily weights, strict I's and O's and low-sodium diet. -Resume home diuretics at discharge.  8-Class 1 obesity -Body mass index is 32.36 kg/m. -Low calorie diet, portion control and increase physical activity discussed with patient.  9-hypophosphatemia and hypocalcemia -Repletion and maintenance provided -Normal vitamin D level appreciated -Continue to follow electrolytes trend as an outpatient.   DVT prophylaxis: Lovenox Code Status: Full code Family Communication: Patient's wife over the phone Gabriel Love) 05/09/20 Disposition:   Status is: Inpatient  Dispo: The patient is from: Home              Anticipated d/c is to: Home              Anticipated d/c date is: 05/10/20              Patient currently no medically stable for discharge; receiving IV antibiotics while awaiting cultures results to transition management to oral route.   Difficult to place patient no       Consultants:   None  Procedures:  See below for x-ray reports.   Antimicrobials:  Rocephin and Flagyl   Subjective: Afebrile, no chest pain, no nausea, no vomiting, no shortness of breath.  Patient reports feeling much better and expressed improvement in his loose stools episodes.  Denies dysuria at this time.  Objective: Vitals:   05/09/20 0454 05/09/20  0504 05/09/20 0824 05/09/20 1010  BP: (!) 143/66  132/85 (!) 111/58  Pulse: (!) 111  (!) 108 76   Resp: '19  18 18  ' Temp: 98.1 F (36.7 C)  98.3 F (36.8 C) 98.2 F (36.8 C)  TempSrc: Oral  Oral Oral  SpO2: 94% 94% 90% 93%  Weight:      Height:        Intake/Output Summary (Last 24 hours) at 05/09/2020 1742 Last data filed at 05/09/2020 1500 Gross per 24 hour  Intake 3676.36 ml  Output 400 ml  Net 3276.36 ml   Filed Weights   05/08/20 1545  Weight: 105.2 kg    Examination:  General exam: Appears calm and comfortable; obese on examination; denying chest pain, shortness of breath, nausea or vomiting.  Expressed significant improvement in his episode of diarrhea and is now complaining of dysuria at this time.  Patient is afebrile. Respiratory system: Clear to auscultation. Respiratory effort normal.  No using accessory muscles.  Good saturation on room air. Cardiovascular system: S1 & S2 heard, RRR. No JVD, murmurs, rubs, gallops or clicks. No pedal edema. Gastrointestinal system: Abdomen is obese, nondistended, soft and nontender.  Positive ventral hernia appreciated on examination no organomegaly or masses felt. Normal bowel sounds heard. Central nervous system: Alert and oriented. No focal neurological deficits. Extremities: No cyanosis or clubbing.  Left lower extremity with a special boot/brace in place. Skin: No petechiae. Psychiatry: Judgement and insight appear normal. Mood & affect appropriate.     Data Reviewed: I have personally reviewed following labs and imaging studies  CBC: Recent Labs  Lab 05/08/20 1740 05/09/20 0522  WBC 8.4 7.4  NEUTROABS 6.9 5.3  HGB 14.5 14.7  HCT 45.7 47.1  MCV 92.1 92.7  PLT 158 126*    Basic Metabolic Panel: Recent Labs  Lab 05/08/20 1740 05/08/20 1958 05/09/20 0522  NA 131*  --  136  K 3.1*  --  3.7  CL 101  --  105  CO2 25  --  23  GLUCOSE 102*  --  87  BUN 14  --  12  CREATININE 0.75  --  0.65  CALCIUM 7.9*  --  8.2*  MG  --  1.7  --   PHOS  --  2.4*  --     GFR: Estimated Creatinine Clearance: 90.9  mL/min (by C-G formula based on SCr of 0.65 mg/dL).  Liver Function Tests: Recent Labs  Lab 05/08/20 1740 05/09/20 0522  AST 34 30  ALT 26 23  ALKPHOS 73 64  BILITOT 0.9 0.9  PROT 6.3* 5.7*  ALBUMIN 3.3* 2.8*    CBG: No results for input(s): GLUCAP in the last 168 hours.   Recent Results (from the past 240 hour(s))  SARS CORONAVIRUS 2 (TAT 6-24 HRS) Nasopharyngeal Nasopharyngeal Swab     Status: None   Collection Time: 05/08/20  3:56 PM   Specimen: Nasopharyngeal Swab  Result Value Ref Range Status   SARS Coronavirus 2 NEGATIVE NEGATIVE Final    Comment: (NOTE) SARS-CoV-2 target nucleic acids are NOT DETECTED.  The SARS-CoV-2 RNA is generally detectable in upper and lower respiratory specimens during the acute phase of infection. Negative results do not preclude SARS-CoV-2 infection, do not rule out co-infections with other pathogens, and should not be used as the sole basis for treatment or other patient management decisions. Negative results must be combined with clinical observations, patient history, and epidemiological information. The expected result is Negative.  Fact Sheet for  Patients: SugarRoll.be  Fact Sheet for Healthcare Providers: https://www.woods-mathews.com/  This test is not yet approved or cleared by the [place] FDA and  has been authorized for detection and/or diagnosis of SARS-CoV-2 by FDA under an Emergency Use Authorization (EUA). This EUA will remain  in effect (meaning this test can be used) for the duration of the COVID-19 declaration under Se ction 564(b)(1) of the Act, 21 U.S.C. section 360bbb-3(b)(1), unless the authorization is terminated or revoked sooner.  Performed at [facility], [place] [address], [place], [place] [address]   Blood culture (routine single)     Status: None (Preliminary result)   Collection Time: 05/08/20  5:48 PM   Specimen: BLOOD  Result Value Ref Range  Status   Specimen Description BLOOD RIGHT ANTECUBITAL  Final   Special Requests Blood Culture adequate volume  Final   Culture   Final    NO GROWTH < 12 HOURS Performed at [facility], [address], [place], [place] [address]    Report Status PENDING  Incomplete  Blood Culture (routine x 2)     Status: None (Preliminary result)   Collection Time: 05/08/20 10:05 PM   Specimen: BLOOD RIGHT HAND  Result Value Ref Range Status   Specimen Description BLOOD RIGHT HAND  Final   Special Requests   Final    BOTTLES DRAWN AEROBIC AND ANAEROBIC Blood Culture adequate volume   Culture   Final    NO GROWTH < 12 HOURS Performed at [facility], [address], [place], [place] [address]    Report Status PENDING  Incomplete     Radiology Studies: CT Abdomen Pelvis Wo Contrast  Result Date: 05/08/2020 CLINICAL DATA:  Nausea and vomiting. Patient reports pain and legs and difficulty walking. Nausea, vomiting, diarrhea. EXAM: CT ABDOMEN AND PELVIS WITHOUT CONTRAST TECHNIQUE: Multidetector CT imaging of the abdomen and pelvis was performed following the standard protocol without IV contrast. COMPARISON:  CT 12/18/2013 FINDINGS: Lower chest: Normal heart size with trace pericardial effusion. There are coronary artery calcifications. No focal airspace disease or pleural effusion. Hepatobiliary: Prominent liver with suspected mild steatosis. No focal lesion. Layering hyperdensity in the gallbladder may be stones or sludge. No pericholecystic fat stranding. No biliary dilatation. Pancreas: No ductal dilatation or inflammation. Spleen: Normal in size without focal abnormality. Adrenals/Urinary Tract: Left greater than right adrenal thickening without adrenal nodule. Symmetric bilateral perinephric edema which has increased from prior exam. No hydronephrosis or renal calculi. No obvious renal lesion on noncontrast exam. Urinary bladder is grossly normal, partially obscured by streak artifact from left hip  arthroplasty. Stomach/Bowel: Decompressed stomach. Normal positioning of the duodenum and ligament of Treitz. No small bowel obstruction, inflammation, or evident wall thickening. Broad-based umbilical hernia contains noninflamed and nonobstructed small bowel. Chain sutures at the bases cecum suggest prior appendectomy. Colonic diverticulosis in the sigmoid colon. No acute diverticulitis. Portions of sigmoid colon are obscured by streak artifact from left hip arthroplasty. Vascular/Lymphatic: Aortic atherosclerosis and tortuosity. Prominent portal caval node measuring 11 mm, series 3, image 29, unchanged. There are multiple prominent and mildly enlarged central mesenteric nodes in the small bowel mesentery with adjacent edema. Prominent retroperitoneal lymph nodes including an aortocaval node measuring 10 mm, series 3, image 40. Reproductive: Prostate gland is primarily obscured by streak artifact from left hip arthroplasty. Other: No ascites or free air. Broad-based umbilical hernia contains fat and nonobstructed nor inflamed small bowel. Musculoskeletal: Diffuse degenerative change throughout the spine. Modic endplate changes noted at L2-L3, progressed  from prior. Left hip arthroplasty. No acute osseous abnormality or focal bone lesion. IMPRESSION: 1. Multiple prominent and mildly enlarged central mesenteric with associated mesenteric edema. 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 There also prominent retroperitoneal lymph nodes are likely reactive. 2. Layering hyperdensity in the gallbladder may be stones or sludge. No CT findings of acute cholecystitis. 3. Colonic diverticulosis without acute inflammation. 4. Broad-based umbilical hernia contains fat and nonobstructed small bowel. 5. Diffuse degenerative change in the spine. Aortic Atherosclerosis (ICD10-I70.0). Electronically Signed   By: Keith Rake M.D.   On: 05/08/2020 20:39   CT Head Wo Contrast  Result Date:  05/08/2020 CLINICAL DATA:  Lower extremity pain, difficulty ambulating, nausea and vomiting EXAM: CT HEAD WITHOUT CONTRAST TECHNIQUE: Contiguous axial images were obtained from the base of the skull through the vertex without intravenous contrast. COMPARISON:  None. FINDINGS: Brain: No acute infarct or hemorrhage. Lateral ventricles and midline structures are unremarkable. No acute extra-axial fluid collections. No mass effect. Vascular: No hyperdense vessel or unexpected calcification. Skull: Normal. Negative for fracture or focal lesion. Sinuses/Orbits: No acute finding. Other: None. IMPRESSION: 1. No acute intracranial process. Electronically Signed   By: Randa Ngo M.D.   On: 05/08/2020 20:32   DG Chest Port 1 View  Result Date: 05/08/2020 CLINICAL DATA:  Possible sepsis. EXAM: PORTABLE CHEST 1 VIEW COMPARISON:  02/14/2017 FINDINGS: 1655 hours. Cardiopericardial silhouette is at upper limits of normal for size. Interstitial markings are diffusely coarsened with chronic features. The lungs are clear without focal pneumonia, edema, pneumothorax or pleural effusion. The visualized bony structures of the thorax show no acute abnormality. Telemetry leads overlie the chest. IMPRESSION: No active disease. Electronically Signed   By: Misty Stanley M.D.   On: 05/08/2020 17:08    Scheduled Meds: . aspirin EC  81 mg Oral Daily  . atorvastatin  40 mg Oral Daily  . buPROPion  150 mg Oral Daily  . enoxaparin (LOVENOX) injection  40 mg Subcutaneous Q24H  . escitalopram  20 mg Oral Daily  . metoprolol tartrate  25 mg Oral BID  . phosphorus  500 mg Oral BID   Continuous Infusions: . cefTRIAXone (ROCEPHIN)  IV 2 g (05/09/20 0932)  . metronidazole 500 mg (05/09/20 0948)     LOS: 1 day    Time spent: 35 minutes    Barton Dubois, MD [facility]   To contact the attending provider between 7A-7P or the covering provider during after hours 7P-7A, please log into the web site [email or web address] and  access using universal [place] password for that web site. If you do not have the password, please call the hospital operator.  05/09/2020, 5:42 PM

## 2020-05-10 DIAGNOSIS — K529 Noninfective gastroenteritis and colitis, unspecified: Secondary | ICD-10-CM | POA: Diagnosis not present

## 2020-05-10 DIAGNOSIS — E669 Obesity, unspecified: Secondary | ICD-10-CM | POA: Diagnosis not present

## 2020-05-10 DIAGNOSIS — N390 Urinary tract infection, site not specified: Secondary | ICD-10-CM | POA: Diagnosis not present

## 2020-05-10 DIAGNOSIS — A419 Sepsis, unspecified organism: Secondary | ICD-10-CM | POA: Diagnosis not present

## 2020-05-10 LAB — CBC WITH DIFFERENTIAL/PLATELET
Abs Immature Granulocytes: 0.02 10*3/uL (ref 0.00–0.07)
Basophils Absolute: 0 10*3/uL (ref 0.0–0.1)
Basophils Relative: 0 %
Eosinophils Absolute: 0.2 10*3/uL (ref 0.0–0.5)
Eosinophils Relative: 2 %
HCT: 39.8 % (ref 39.0–52.0)
Hemoglobin: 12.6 g/dL — ABNORMAL LOW (ref 13.0–17.0)
Immature Granulocytes: 0 %
Lymphocytes Relative: 19 %
Lymphs Abs: 1.4 10*3/uL (ref 0.7–4.0)
MCH: 29.4 pg (ref 26.0–34.0)
MCHC: 31.7 g/dL (ref 30.0–36.0)
MCV: 92.8 fL (ref 80.0–100.0)
Monocytes Absolute: 0.8 10*3/uL (ref 0.1–1.0)
Monocytes Relative: 11 %
Neutro Abs: 5.1 10*3/uL (ref 1.7–7.7)
Neutrophils Relative %: 68 %
Platelets: 156 10*3/uL (ref 150–400)
RBC: 4.29 MIL/uL (ref 4.22–5.81)
RDW: 15.2 % (ref 11.5–15.5)
WBC: 7.4 10*3/uL (ref 4.0–10.5)
nRBC: 0 % (ref 0.0–0.2)

## 2020-05-10 LAB — GLUCOSE, CAPILLARY
Glucose-Capillary: 105 mg/dL — ABNORMAL HIGH (ref 70–99)
Glucose-Capillary: 131 mg/dL — ABNORMAL HIGH (ref 70–99)

## 2020-05-10 MED ORDER — CIPROFLOXACIN HCL 500 MG PO TABS: 500.0000 mg | ORAL_TABLET | Freq: Two times a day (BID) | ORAL | 0 refills | Status: AC

## 2020-05-10 MED ORDER — METRONIDAZOLE 500 MG PO TABS: 500.0000 mg | ORAL_TABLET | Freq: Three times a day (TID) | ORAL | 0 refills | Status: AC

## 2020-05-10 MED ORDER — ALBUTEROL SULFATE HFA 108 (90 BASE) MCG/ACT IN AERS: 2.0000 | INHALATION_SPRAY | Freq: Four times a day (QID) | RESPIRATORY_TRACT | 2 refills | Status: DC | PRN

## 2020-05-10 NOTE — Progress Notes (Signed)
Patient was having periods of apnea show on the tele monitor. I was unable to visually see these episodes. I did check his O2 sats and he was ranging from 77-91% on room . I placed him on 2L via [place].

## 2020-05-10 NOTE — Discharge Summary (Signed)
Physician Discharge Summary  Gabriel Love XIH:[id number] DOB: [birth year] DOA: 05/08/2020  PCP: Gabriel Morale, MD  Admit date: 05/08/2020 Discharge date: 05/10/2020  Time spent: 35 minutes  Recommendations for Outpatient Follow-up:  1. Repeat basic metabolic panel to follow close renal function 2. Repeat phosphorus and magnesium level. 3. Reassess blood pressure and adjust antihypertensive agents. 4. Assess patient arranging evaluation for sleep study.   Discharge Diagnoses:  Principal Problem:   Sepsis secondary to UTI [facility]) Active Problems:   Hyperlipidemia   Essential hypertension   Atherosclerotic peripheral vascular disease with intermittent claudication (HCC)   Depression with anxiety   Grade I diastolic dysfunction   Class 1 obesity   Hyponatremia   Hypokalemia   Hypoalbuminemia   Mild protein malnutrition ([place])   Acute gastroenteritis   Discharge Condition: Stable and improved.  Discharged home with instructions to follow-up with PCP in 10 days.  CODE STATUS: Full code.  Diet recommendation: Low calorie, heart healthy/low-fat diet.  Filed Weights   05/08/20 1545  Weight: 105.2 kg    History of present illness:  As per H&P written by Dr. Olevia Love in 05/08/2020 Gabriel Love a 80 y.o.malewith medical history significant ofabdominal hernia, unspecified allergies, history of blood transfusions, unspecified skin cancer, cataract, depression with anxiety, bilateral DJD of shoulders, grade 1 diastolic dysfunction, hearing loss, colon polyps, hyperlipidemia, hypertension, peripheral vascular disease, BPH, history of UTI who is coming to the emergency departmentdue to an episode of emesis after he ate lunch yesterday associated with over 20 episodes of loose stools. Denies constipation, melena or hematochezia. No dysuria, frequency or hematuria. He denies fever, was febrile on arrival to the emergency department. No headaches, chills, night sweats, rhinorrhea, sore  throat, wheezing or hemoptysis. He denies chest pain, palpitations, diaphoresis, but has felt lightheaded. No recent pitting edema of the lower extremities, orthopnea or PND. He denies polyuria, polydipsia, polyphagia or blurred vision.  Hospital Course:  1-sepsis secondary to Pseudomonas UTI and gastroenteritis [facility]) -Patient met criteria for sepsis on admission. -Currently sepsis features improved/resolved. -Patient discharged on Cipro and Flagyl orally to complete antibiotic therapy. -Advised to maintain adequate hydration and to follow-up with PCP in 10 days. -No nausea, no vomiting, no diarrhea.  2-hypokalemia/hyponatremia/mild dehydration -In the setting of GI losses (diarrhea) and chronic use of diuretics. -Electrolytes repleted and gentle fluid resuscitation provided -Will recommend repeating basic metabolic panel at follow-up visit to reassess electrolytes stability.  3-Hyperlipidemia -Continue the use of Lipitor. -Instructed to follow heart healthy diet.  4-Essential hypertension -Continue the use of Lopressor -Follow heart healthy diet -Resume diuretics as previously prescribed.  5-Atherosclerotic peripheral vascular disease with intermittent claudication (HCC) -Continue statins and aspirin -Patient advised to pace himself while performing activities.  6-Depression with anxiety -No suicidal ideation or hallucination -Continue the use of Wellbutrin and Lexapro  7-chronic grade I diastolic dysfunction -Appears to be stable and compensated -Continue to follow daily weights, and to follow a low-sodium diet. -Resume home diuretics at discharge.  8-Class 1 obesity -Body mass index is 32.36 kg/m. -Low calorie diet, portion control and increase physical activity discussed with patient.  9-hypophosphatemia and hypocalcemia -Repletion and daily maintenance provided -Normal vitamin D level appreciated -Repeat electrolytes level continue to follow trend/stability  as an outpatient.  Procedures: See below for x-ray reports.  Consultations:  None  Discharge Exam: Vitals:   05/09/20 2126 05/10/20 0531  BP: (!) 113/59 130/77  Pulse: 78 81  Resp: 18 16  Temp: 97.6 F (36.4 C) 97.6 F (  36.4 C)  SpO2: 95% 96%    General: Afebrile, no chest pain, no nausea, no vomiting.  Reports feeling good and ready to go home.  Patient reports no dysuria. Cardiovascular: Rate controlled, no rubs, no gallops, no JVD. Respiratory: Good air movement bilaterally, no wheezing, no crackles; no using accessory muscles.  Good saturation on room air. Abdomen: Obese, soft, nontender, distended, positive bowel sounds Extremities: Chronic lymphedema bilaterally; left lower extremity with boot/brace in place unchanged from baseline.   Discharge Instructions   Discharge Instructions    (HEART FAILURE PATIENTS) Call MD:  Anytime you have any of the following symptoms: 1) 3 pound weight gain in 24 hours or 5 pounds in 1 week 2) shortness of breath, with or without a dry hacking cough 3) swelling in the hands, feet or stomach 4) if you have to sleep on extra pillows at night in order to breathe.   Complete by: As directed    Diet - low sodium heart healthy   Complete by: As directed    Discharge instructions   Complete by: As directed    Take medications as prescribed Maintain adequate hydration Follow low-sodium/low calorie diet Arrange follow-up with PCP in 10 days     Allergies as of 05/10/2020      Reactions   Ivp Dye [iodinated Diagnostic Agents] Anaphylaxis   Iv dye allergy in 1985, anaphylaxix//a.calhoun- "shocked back to life"   Penicillins Other (See Comments)   Urticaria Has patient had a PCN reaction causing immediate rash, facial/tongue/throat swelling, SOB or lightheadedness with hypotension: no Has patient had a PCN reaction causing severe rash involving mucus membranes or skin necrosis: no Has patient had a PCN reaction that required hospitalization  no Has patient had a PCN reaction occurring within the last 10 years: yes If all of the above answers are "NO", then may proceed with Cephalosporin use.   Lisinopril Cough      Medication List    TAKE these medications   albuterol 108 (90 Base) MCG/ACT inhaler Commonly known as: VENTOLIN HFA Inhale 2 puffs into the lungs every 6 (six) hours as needed for wheezing or shortness of breath. What changed: See the new instructions.   aspirin EC 81 MG tablet Take 81 mg by mouth daily.   atorvastatin 40 MG tablet Commonly known as: LIPITOR Take 1 tablet (40 mg total) by mouth daily.   buPROPion 150 MG 24 hr tablet Commonly known as: Wellbutrin XL Take 1 tablet (150 mg total) by mouth daily.   CENTRUM ADULTS PO Take 1 tablet by mouth daily.   ciprofloxacin 500 MG tablet Commonly known as: Cipro Take 1 tablet (500 mg total) by mouth 2 (two) times daily for 7 days.   clotrimazole-betamethasone cream Commonly known as: LOTRISONE Apply 1 application topically daily. Applies to affected area.   furosemide 40 MG tablet Commonly known as: LASIX TAKE 1 TABLET(40 MG) BY MOUTH TWICE DAILY What changed: See the new instructions.   Lexapro 20 MG tablet Generic drug: escitalopram Take 1 tablet (20 mg total) by mouth daily.   metoprolol succinate 50 MG 24 hr tablet Commonly known as: TOPROL-XL TAKE 1 TABLET BY MOUTH EVERY DAY   metroNIDAZOLE 500 MG tablet Commonly known as: Flagyl Take 1 tablet (500 mg total) by mouth 3 (three) times daily for 7 days.   mupirocin ointment 2 % Commonly known as: Bactroban Apply topically 2 (two) times daily.   potassium chloride SA 20 MEQ tablet Commonly known as: KLOR-CON Take 1  tablet (20 mEq total) by mouth daily.   VITAMIN C PO Take 1 tablet by mouth daily.      Allergies  Allergen Reactions  . Ivp Dye [Iodinated Diagnostic Agents] Anaphylaxis    Iv dye allergy in 1985, anaphylaxix//a.calhoun- "shocked back to life"  . Penicillins  Other (See Comments)    Urticaria Has patient had a PCN reaction causing immediate rash, facial/tongue/throat swelling, SOB or lightheadedness with hypotension: no Has patient had a PCN reaction causing severe rash involving mucus membranes or skin necrosis: no Has patient had a PCN reaction that required hospitalization no Has patient had a PCN reaction occurring within the last 10 years: yes If all of the above answers are "NO", then may proceed with Cephalosporin use.   Marland Kitchen Lisinopril Cough    Follow-up Information    Gabriel Morale, MD. Schedule an appointment as soon as possible for a visit in 10 day(s).   Specialty: Family Medicine Contact information: [place] [address] [phone]               The results of significant diagnostics from this hospitalization (including imaging, microbiology, ancillary and laboratory) are listed below for reference.    Significant Diagnostic Studies: CT Abdomen Pelvis Wo Contrast  Result Date: 05/08/2020 CLINICAL DATA:  Nausea and vomiting. Patient reports pain and legs and difficulty walking. Nausea, vomiting, diarrhea. EXAM: CT ABDOMEN AND PELVIS WITHOUT CONTRAST TECHNIQUE: Multidetector CT imaging of the abdomen and pelvis was performed following the standard protocol without IV contrast. COMPARISON:  CT 12/18/2013 FINDINGS: Lower chest: Normal heart size with trace pericardial effusion. There are coronary artery calcifications. No focal airspace disease or pleural effusion. Hepatobiliary: Prominent liver with suspected mild steatosis. No focal lesion. Layering hyperdensity in the gallbladder may be stones or sludge. No pericholecystic fat stranding. No biliary dilatation. Pancreas: No ductal dilatation or inflammation. Spleen: Normal in size without focal abnormality. Adrenals/Urinary Tract: Left greater than right adrenal thickening without adrenal nodule. Symmetric bilateral perinephric edema which has increased  from prior exam. No hydronephrosis or renal calculi. No obvious renal lesion on noncontrast exam. Urinary bladder is grossly normal, partially obscured by streak artifact from left hip arthroplasty. Stomach/Bowel: Decompressed stomach. Normal positioning of the duodenum and ligament of Treitz. No small bowel obstruction, inflammation, or evident wall thickening. Broad-based umbilical hernia contains noninflamed and nonobstructed small bowel. Chain sutures at the bases cecum suggest prior appendectomy. Colonic diverticulosis in the sigmoid colon. No acute diverticulitis. Portions of sigmoid colon are obscured by streak artifact from left hip arthroplasty. Vascular/Lymphatic: Aortic atherosclerosis and tortuosity. Prominent portal caval node measuring 11 mm, series 3, image 29, unchanged. There are multiple prominent and mildly enlarged central mesenteric nodes in the small bowel mesentery with adjacent edema. Prominent retroperitoneal lymph nodes including an aortocaval node measuring 10 mm, series 3, image 40. Reproductive: Prostate gland is primarily obscured by streak artifact from left hip arthroplasty. Other: No ascites or free air. Broad-based umbilical hernia contains fat and nonobstructed nor inflamed small bowel. Musculoskeletal: Diffuse degenerative change throughout the spine. Modic endplate changes noted at L2-L3, progressed from prior. Left hip arthroplasty. No acute osseous abnormality or focal bone lesion. IMPRESSION: 1. Multiple prominent and mildly enlarged central mesenteric with associated mesenteric edema. 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 There also prominent retroperitoneal lymph nodes are likely reactive. 2. Layering hyperdensity in the gallbladder may be stones or sludge. No CT findings of acute cholecystitis. 3. Colonic diverticulosis without acute  inflammation. 4. Broad-based umbilical hernia contains fat and nonobstructed small bowel. 5. Diffuse  degenerative change in the spine. Aortic Atherosclerosis (ICD10-I70.0). Electronically Signed   By: Keith Rake M.D.   On: 05/08/2020 20:39   CT Head Wo Contrast  Result Date: 05/08/2020 CLINICAL DATA:  Lower extremity pain, difficulty ambulating, nausea and vomiting EXAM: CT HEAD WITHOUT CONTRAST TECHNIQUE: Contiguous axial images were obtained from the base of the skull through the vertex without intravenous contrast. COMPARISON:  None. FINDINGS: Brain: No acute infarct or hemorrhage. Lateral ventricles and midline structures are unremarkable. No acute extra-axial fluid collections. No mass effect. Vascular: No hyperdense vessel or unexpected calcification. Skull: Normal. Negative for fracture or focal lesion. Sinuses/Orbits: No acute finding. Other: None. IMPRESSION: 1. No acute intracranial process. Electronically Signed   By: Randa Ngo M.D.   On: 05/08/2020 20:32   DG Chest Port 1 View  Result Date: 05/08/2020 CLINICAL DATA:  Possible sepsis. EXAM: PORTABLE CHEST 1 VIEW COMPARISON:  02/14/2017 FINDINGS: 1655 hours. Cardiopericardial silhouette is at upper limits of normal for size. Interstitial markings are diffusely coarsened with chronic features. The lungs are clear without focal pneumonia, edema, pneumothorax or pleural effusion. The visualized bony structures of the thorax show no acute abnormality. Telemetry leads overlie the chest. IMPRESSION: No active disease. Electronically Signed   By: Misty Stanley M.D.   On: 05/08/2020 17:08    Microbiology: Recent Results (from the past 240 hour(s))  SARS CORONAVIRUS 2 (TAT 6-24 HRS) Nasopharyngeal Nasopharyngeal Swab     Status: None   Collection Time: 05/08/20  3:56 PM   Specimen: Nasopharyngeal Swab  Result Value Ref Range Status   SARS Coronavirus 2 NEGATIVE NEGATIVE Final    Comment: (NOTE) SARS-CoV-2 target nucleic acids are NOT DETECTED.  The SARS-CoV-2 RNA is generally detectable in upper and lower respiratory specimens  during the acute phase of infection. Negative results do not preclude SARS-CoV-2 infection, do not rule out co-infections with other pathogens, and should not be used as the sole basis for treatment or other patient management decisions. Negative results must be combined with clinical observations, patient history, and epidemiological information. The expected result is Negative.  Fact Sheet for Patients: SugarRoll.be  Fact Sheet for Healthcare Providers: https://www.woods-mathews.com/  This test is not yet approved or cleared by the [place] FDA and  has been authorized for detection and/or diagnosis of SARS-CoV-2 by FDA under an Emergency Use Authorization (EUA). This EUA will remain  in effect (meaning this test can be used) for the duration of the COVID-19 declaration under Se ction 564(b)(1) of the Act, 21 U.S.C. section 360bbb-3(b)(1), unless the authorization is terminated or revoked sooner.  Performed at [facility], [place] [address], [place], [place] [address]   Urine culture     Status: Abnormal (Preliminary result)   Collection Time: 05/08/20  4:14 PM   Specimen: In/Out Cath Urine  Result Value Ref Range Status   Specimen Description   Final    IN/OUT CATH URINE Performed at [facility], [address] [place], [place] [address]    Special Requests   Final    NONE Performed at [facility], [address] [place], [place] [address]    Culture (A)  Final    >=100,000 COLONIES/mL PSEUDOMONAS AERUGINOSA CULTURE REINCUBATED FOR BETTER GROWTH SUSCEPTIBILITIES TO FOLLOW Performed at [facility], [place] [address] [place], [place] [address]    Report Status PENDING  Incomplete  Blood culture (routine single)  Status: None (Preliminary result)   Collection Time: 05/08/20  5:48 PM   Specimen: BLOOD  Result Value Ref Range Status   Specimen Description BLOOD RIGHT ANTECUBITAL  Final   Special Requests Blood  Culture adequate volume  Final   Culture   Final    NO GROWTH 2 DAYS Performed at [facility], [address] [place], [place] [address]    Report Status PENDING  Incomplete  Blood Culture (routine x 2)     Status: None (Preliminary result)   Collection Time: 05/08/20 10:05 PM   Specimen: BLOOD RIGHT HAND  Result Value Ref Range Status   Specimen Description BLOOD RIGHT HAND  Final   Special Requests   Final    BOTTLES DRAWN AEROBIC AND ANAEROBIC Blood Culture adequate volume   Culture   Final    NO GROWTH 2 DAYS Performed at [facility], [address] [place], [place] [address]    Report Status PENDING  Incomplete     Labs: Basic Metabolic Panel: Recent Labs  Lab 05/08/20 1740 05/08/20 1958 05/09/20 0522  NA 131*  --  136  K 3.1*  --  3.7  CL 101  --  105  CO2 25  --  23  GLUCOSE 102*  --  87  BUN 14  --  12  CREATININE 0.75  --  0.65  CALCIUM 7.9*  --  8.2*  MG  --  1.7  --   PHOS  --  2.4*  --    Liver Function Tests: Recent Labs  Lab 05/08/20 1740 05/09/20 0522  AST 34 30  ALT 26 23  ALKPHOS 73 64  BILITOT 0.9 0.9  PROT 6.3* 5.7*  ALBUMIN 3.3* 2.8*   CBC: Recent Labs  Lab 05/08/20 1740 05/09/20 0522 05/10/20 0552  WBC 8.4 7.4 7.4  NEUTROABS 6.9 5.3 5.1  HGB 14.5 14.7 12.6*  HCT 45.7 47.1 39.8  MCV 92.1 92.7 92.8  PLT 158 126* 156   CBG: Recent Labs  Lab 05/09/20 2122 05/10/20 0755 05/10/20 1112  GLUCAP 77 105* 131*    Signed:  Barton Dubois MD.  [facility] 05/10/2020, 2:23 PM

## 2020-05-12 ENCOUNTER — Encounter: Payer: Self-pay | Admitting: Family Medicine

## 2020-05-12 ENCOUNTER — Other Ambulatory Visit: Payer: Self-pay

## 2020-05-12 ENCOUNTER — Ambulatory Visit (INDEPENDENT_AMBULATORY_CARE_PROVIDER_SITE_OTHER): Payer: Medicare PPO | Admitting: Family Medicine

## 2020-05-12 VITALS — BP 142/84 | HR 78 | Temp 98.0°F | Ht 71.0 in | Wt 332.0 lb

## 2020-05-12 DIAGNOSIS — J849 Interstitial pulmonary disease, unspecified: Secondary | ICD-10-CM

## 2020-05-12 DIAGNOSIS — A419 Sepsis, unspecified organism: Secondary | ICD-10-CM

## 2020-05-12 DIAGNOSIS — R0602 Shortness of breath: Secondary | ICD-10-CM | POA: Diagnosis not present

## 2020-05-12 DIAGNOSIS — K529 Noninfective gastroenteritis and colitis, unspecified: Secondary | ICD-10-CM

## 2020-05-12 DIAGNOSIS — N390 Urinary tract infection, site not specified: Secondary | ICD-10-CM

## 2020-05-12 LAB — URINE CULTURE: Culture: >=100000 — AB

## 2020-05-12 NOTE — Progress Notes (Signed)
   Subjective:    Patient ID: Gabriel Love, male    DOB: 27-Jun-1940, 80 y.o.   MRN: [id number]  HPI Here to follow up from a hospital stay from 05-08-20 to 05-10-20 and for complaints of SOB. At admission he presented with generalized weakness and several days of diarrhea. No abdominal pain or nausea or fever. No urinary burning or blood, though he urinates frequently form the Lasix he takes. He actually fell at home and EMS was called. He apparently did not injure himself  in the fall. He was found to have a pan-sensitive Pseudomonas UTI and a gastroenteritis. Blood cultures were negative. WBC was normal. Creatinine was normal. A CXR was negative for pneumonia or fluid overload, but coarse interstitial markings were described (these were not seen on a CXR from 2018). He is a former smoker. He was treated with IV antibiotics and fluids, and he was sent home on a 7 day supply of Cipro and Flagyl. Since going home he has felt better with less weakness and the diarrhea has resolved. However yesterday he began to have more SOB especially on exertion. No chest pain or cough. He has been using his albuterol inhaler once or twice a day with mixed results.    Review of Systems  Constitutional: Negative.   Respiratory: Positive for shortness of breath. Negative for cough.   Cardiovascular: Positive for leg swelling. Negative for chest pain and palpitations.  Gastrointestinal: Negative.   Genitourinary: Positive for frequency. Negative for dysuria and hematuria.       Objective:   Physical Exam Constitutional:      General: He is not in acute distress.    Appearance: He is obese.     Comments: In a wheelchair   Cardiovascular:     Rate and Rhythm: Normal rate and regular rhythm.     Pulses: Normal pulses.     Heart sounds: Normal heart sounds.  Pulmonary:     Effort: Pulmonary effort is normal. No respiratory distress.     Breath sounds: No rhonchi or rales.     Comments: Slight expiratory wheezes  and soft bibasilar crackles  Neurological:     Mental Status: He is alert.           Assessment & Plan:  He UTI and gastroenteritis appear to be resolving. He will finish up the antibiotics. His recent SOB may be related to some interstitial lung disease. We will refer him to Pulmonary to evaluate further.  Alysia Penna, MD

## 2020-05-13 LAB — CULTURE, BLOOD (ROUTINE X 2)
Culture: NO GROWTH
Special Requests: ADEQUATE

## 2020-05-13 LAB — CULTURE, BLOOD (SINGLE)
Culture: NO GROWTH
Special Requests: ADEQUATE

## 2020-05-30 ENCOUNTER — Other Ambulatory Visit: Payer: Self-pay | Admitting: Family Medicine

## 2020-06-10 ENCOUNTER — Encounter: Payer: Self-pay | Admitting: Podiatry

## 2020-06-10 ENCOUNTER — Other Ambulatory Visit: Payer: Self-pay

## 2020-06-10 ENCOUNTER — Ambulatory Visit: Payer: Medicare PPO | Admitting: Podiatry

## 2020-06-10 DIAGNOSIS — B351 Tinea unguium: Secondary | ICD-10-CM | POA: Diagnosis not present

## 2020-06-10 DIAGNOSIS — I739 Peripheral vascular disease, unspecified: Secondary | ICD-10-CM

## 2020-06-10 DIAGNOSIS — M79674 Pain in right toe(s): Secondary | ICD-10-CM

## 2020-06-10 DIAGNOSIS — M21371 Foot drop, right foot: Secondary | ICD-10-CM

## 2020-06-10 DIAGNOSIS — M79675 Pain in left toe(s): Secondary | ICD-10-CM

## 2020-06-10 DIAGNOSIS — M21372 Foot drop, left foot: Secondary | ICD-10-CM

## 2020-06-10 NOTE — Progress Notes (Signed)
Subjective: Gabriel Love is a pleasant 80 y.o. male patient seen today at [facility]. Pt has h/o NIDDM with PAD and painful thick toenails that are difficult to trim. Pain interferes with ambulation. Aggravating factors include wearing enclosed shoe gear. Pain is relieved with periodic professional debridement.   Patient's wife is present during today's visit.   Allergies  Allergen Reactions  . Ivp Dye [Iodinated Diagnostic Agents] Anaphylaxis    Iv dye allergy in 1985, anaphylaxix//a.calhoun- "shocked back to life"  . Penicillins Other (See Comments)    Urticaria Has patient had a PCN reaction causing immediate rash, facial/tongue/throat swelling, SOB or lightheadedness with hypotension: no Has patient had a PCN reaction causing severe rash involving mucus membranes or skin necrosis: no Has patient had a PCN reaction that required hospitalization no Has patient had a PCN reaction occurring within the last 10 years: yes If all of the above answers are "NO", then may proceed with Cephalosporin use.   Marland Kitchen Lisinopril Cough   Objective: Physical Exam  General: Gabriel Love is a pleasant 80 y.o. Caucasian male, morbidly obese in NAD. AAO x 3.   Vascular:  Capillary fill time to digits <3 seconds b/l lower extremities. Palpable DP pulse(s) b/l lower extremities Nonpalpable PT pulse(s) b/l lower extremities. Pedal hair absent. Lower extremity skin temperature gradient within normal limits. No pain with calf compression b/l. Trace edema noted b/l lower extremities.  Dermatological:  Pedal skin with normal turgor, texture and tone bilaterally. No open wounds bilaterally. No interdigital macerations bilaterally. Toenails 1-5 b/l elongated, discolored, dystrophic, thickened, crumbly with subungual debris and tenderness to dorsal palpation.  Musculoskeletal:  Muscle strength 5/5 with plantarflexion, inversion, and eversion b/l. Dropfoot b/l lower extremities. No pain crepitus or joint  limitation noted with ROM b/l. No gross bony deformities bilaterally.  Wearing brace in shoe for LLE.  Neurological:  Protective sensation intact 5/5 intact bilaterally with 10g monofilament b/l. Vibratory sensation intact b/l. Proprioception intact bilaterally.  Assessment and Plan:  1. Pain due to onychomycosis of toenails of both feet   2. Foot drop, bilateral   3. Peripheral vascular disease with claudication ([name])    -Examined patient. -No new findings. No new orders. -Patient to continue soft, supportive shoe gear daily. -Toenails 1-5 b/l were debrided in length and girth with sterile nail nippers and dremel without iatrogenic bleeding.  -Patient to report any pedal injuries to medical professional immediately. -Patient/POA to call should there be question/concern in the interim.  Return in about 3 months (around 09/10/2020).  Marzetta Board, DPM

## 2020-06-16 ENCOUNTER — Encounter: Payer: Self-pay | Admitting: Family Medicine

## 2020-06-20 ENCOUNTER — Other Ambulatory Visit: Payer: Self-pay | Admitting: Family Medicine

## 2020-06-30 ENCOUNTER — Telehealth: Payer: Self-pay | Admitting: Family Medicine

## 2020-06-30 NOTE — Telephone Encounter (Signed)
Left message for patient to call back and schedule Medicare Annual Wellness Visit (AWV) either virtually or in office. No detailed message left   Last AWV ;AWV-S per [place] 11/21/10   please schedule at anytime with [facility] Nurse Health Advisor 1 or 2   This should be a 45 minute visit.

## 2020-08-05 DIAGNOSIS — H04123 Dry eye syndrome of bilateral lacrimal glands: Secondary | ICD-10-CM | POA: Diagnosis not present

## 2020-08-06 ENCOUNTER — Other Ambulatory Visit: Payer: Self-pay | Admitting: Family Medicine

## 2020-09-10 ENCOUNTER — Other Ambulatory Visit: Payer: Self-pay | Admitting: Family Medicine

## 2020-09-11 ENCOUNTER — Ambulatory Visit: Payer: Medicare PPO

## 2020-09-15 ENCOUNTER — Other Ambulatory Visit: Payer: Self-pay | Admitting: Family Medicine

## 2020-09-17 ENCOUNTER — Ambulatory Visit: Payer: Medicare PPO

## 2020-09-23 ENCOUNTER — Telehealth: Payer: Self-pay

## 2020-09-23 NOTE — Telephone Encounter (Signed)
Called pt three times for 315 pm MWV , number continues to ring with a busy signal,  Patient my reschedule next available appointment.  L.Nesiah Jump,LPN

## 2020-09-23 NOTE — Progress Notes (Deleted)
Subjective:   Gabriel Love is a 80 y.o. male who presents for an Initial Medicare Annual Wellness Visit.  I connected with *** today by telephone and verified that I am speaking with the correct person using two identifiers. Location patient: home Location provider: work Persons participating in the virtual visit: patient, provider.   I discussed the limitations, risks, security and privacy concerns of performing an evaluation and management service by telephone and the availability of in person appointments. I also discussed with the patient that there may be a patient responsible charge related to this service. The patient expressed understanding and verbally consented to this telephonic visit.    Interactive audio and video telecommunications were attempted between this provider and patient, however failed, due to patient having technical difficulties OR patient did not have access to video capability.  We continued and completed visit with audio only.    Review of Systems    ***       Objective:    There were no vitals filed for this visit. There is no height or weight on file to calculate BMI.  Advanced Directives 05/09/2020 05/08/2020 10/21/2019 10/10/2019 02/07/2018 01/31/2018 01/26/2018  Does Patient Have a Medical Advance Directive? No No No No Yes Yes Yes  Type of Advance Directive - - - - Press photographer;Living will [place];Living will [place];Living will  Does patient want to make changes to medical advance directive? - - - - - - -  Copy of [place] in Chart? - - - - No - copy requested No - copy requested No - copy requested  Would patient like information on creating a medical advance directive? No - Patient declined No - Patient declined No - Patient declined - - - -  Pre-existing out of facility DNR order (yellow form or pink MOST form) - - - - - - -    Current Medications (verified) Outpatient  Encounter Medications as of 09/23/2020  Medication Sig   albuterol (VENTOLIN HFA) 108 (90 Base) MCG/ACT inhaler Inhale 2 puffs into the lungs every 6 (six) hours as needed for wheezing or shortness of breath.   Ascorbic Acid (VITAMIN C PO) Take 1 tablet by mouth daily.   aspirin EC 81 MG tablet Take 81 mg by mouth daily.   atorvastatin (LIPITOR) 40 MG tablet TAKE 1 TABLET(40 MG) BY MOUTH DAILY   buPROPion (WELLBUTRIN XL) 150 MG 24 hr tablet TAKE 1 TABLET(150 MG) BY MOUTH DAILY   clotrimazole-betamethasone (LOTRISONE) cream Apply 1 application topically daily. Applies to affected area. (Patient not taking: Reported on 05/12/2020)   furosemide (LASIX) 40 MG tablet TAKE 1 TABLET(40 MG) BY MOUTH TWICE DAILY   LEXAPRO 20 MG tablet TAKE 1 TABLET(20 MG) BY MOUTH DAILY   metoprolol succinate (TOPROL-XL) 50 MG 24 hr tablet TAKE 1 TABLET BY MOUTH EVERY DAY   Multiple Vitamins-Minerals (CENTRUM ADULTS PO) Take 1 tablet by mouth daily.   mupirocin ointment (BACTROBAN) 2 % Apply topically 2 (two) times daily. (Patient not taking: Reported on 05/12/2020)   potassium chloride SA (KLOR-CON) 20 MEQ tablet TAKE 1 TABLET(20 MEQ) BY MOUTH DAILY   No facility-administered encounter medications on file as of 09/23/2020.    Allergies (verified) Ivp dye [iodinated diagnostic agents], Penicillins, and Lisinopril   History: Past Medical History:  Diagnosis Date   Abdominal hernia    Allergy    Blood transfusion without reported diagnosis 12/2006   At [facility] with  hip replacement - 2 units transfused   Cancer (HCC)    skin cancers   Cataract    Depression    DJD (degenerative joint disease)    shoulders bilateral   Environmental allergies    Full dentures    Grade I diastolic dysfunction 1/61/0960   Hearing loss    bilateral hearing aids   History of colonic polyps    History of urinary tract infection    Hyperlipidemia    under control   Hypertension    "borderline"   Nonspecific abnormal finding in stool  contents    Other general symptoms(780.99)    Prostate hypertrophy    sees Dr. Gaynelle Arabian    Urinary leakage    Past Surgical History:  Procedure Laterality Date   CATARACT EXTRACTION W/PHACO Right 03/25/2014   Procedure: CATARACT EXTRACTION PHACO AND INTRAOCULAR LENS PLACEMENT; CDE:9.76;  Surgeon: Williams Che, MD;  Location: [facility];  Service: Ophthalmology;  Laterality: Right;   CATARACT EXTRACTION W/PHACO Left 06/24/2014   Procedure: CATARACT EXTRACTION PHACO AND INTRAOCULAR LENS PLACEMENT (IOC);  Surgeon: Williams Che, MD;  Location: [facility];  Service: Ophthalmology;  Laterality: Left;  CDE:8.45   CIRCUMCISION N/A 03/26/2016   Procedure: CIRCUMCISION ADULT;  Surgeon: Carolan Clines, MD;  Location: [facility];  Service: Urology;  Laterality: N/ADenton Me[place], [place]   COLONOSCOPY  06-19-13, 03/05/15   per Dr. 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 Percell Miller    LAPAROSCOPIC PARTIAL COLECTOMY N/A 08/07/2013   Procedure: LAPAROSCOPIC right PARTIAL COLECTOMY;  Surgeon: Leighton Ruff, MD;  Location: [facility];  Service: General;  Laterality: N/A;   MULTIPLE TOOTH EXTRACTIONS     PROSTATE SURGERY  03-29-11   had CTT per Dr. Gaynelle Arabian, in office   Family History  Problem Relation Age of Onset   Hypertension Mother    Diabetes Mother    Heart failure Mother        cad/mi with rupture ventricle   Hyperlipidemia Father    Colon polyps Father    Cancer Neg Hx        colon or prostate   Colon cancer Neg Hx    Esophageal cancer Neg Hx    Rectal cancer Neg Hx    Stomach cancer Neg Hx    Social History   Socioeconomic History   Marital status: Married    Spouse name: Not  on file   Number of children: 1   Years of education: Not on file   Highest education level: Not on file  Occupational History   Occupation: Retired  Tobacco Use   Smoking status: Former    Packs/day: 1.00    Years: 5.00    Pack years: 5.00    Types: Cigarettes    Quit date: 03/22/1968    Years since quitting: 52.5   Smokeless tobacco: Former    Types: Chew    Quit date: 09/20/2015   Tobacco comments:    occassionally  Substance and Sexual Activity   Alcohol use: No    Alcohol/week: 0.0 standard drinks   Drug use: No   Sexual activity: Not on file  Other Topics Concern   Not on  file  Social History Narrative   Married 1965   Very supportive wife      One daughter  15  One grandchild (girl  2002)   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 Y  Vision? N  Difficulty concentrating or making decisions? N  Walking or climbing stairs? Y  Dressing or bathing? N  Doing errands, shopping? N  Some recent data might be hidden    Patient Care Team: Laurey Morale, MD as PCP - General (Family Medicine)  Indicate any recent Medical Services you may have received from other than [facility] providers in the past year (date may be approximate).     Assessment:   This is a routine wellness examination for Gabriel Love.  Hearing/Vision screen No results found.  Dietary issues and exercise activities discussed:     Goals Addressed   None    Depression  Screen PHQ 2/9 Scores 05/12/2020 12/20/2017 03/02/2016 02/28/2015  PHQ - 2 Score 2 0 0 0  PHQ- 9 Score 5 - - -    Fall Risk Fall Risk  05/12/2020 12/20/2017 03/02/2016 02/28/2015 02/28/2015  Falls in the past year? 1 Yes No Yes No  Number falls in past yr: 0 1 - 1 -  Injury with Fall? 0 Yes - Yes -  Follow up - - - Falls evaluation completed -    FALL RISK PREVENTION PERTAINING TO THE HOME:  Any stairs in or around the home? {YES/NO:21197} If so, are there any without handrails? {YES/NO:21197} Home free of loose throw rugs in walkways, pet beds, electrical cords, etc? {YES/NO:21197} Adequate lighting in your home to reduce risk of falls? {YES/NO:21197}  ASSISTIVE DEVICES UTILIZED TO PREVENT FALLS:  Life alert? {YES/NO:21197} Use of a cane, walker or w/c? {YES/NO:21197} Grab bars in the bathroom? {YES/NO:21197} Shower chair or bench in shower? {YES/NO:21197} Elevated toilet seat or a handicapped toilet? {YES/NO:21197}  TIMED UP AND GO:  Was the test performed? {YES/NO:21197}.  Length of time to ambulate 10 feet: *** sec.   {Appearance of YIAX:6553748}  Cognitive Function:        Immunizations Immunization History  Administered Date(s) Administered   Fluad Quad(high Dose 65+) 01/21/2020   Influenza Split 02/24/2011, 01/26/2012   Influenza Whole 01/12/2008, 01/14/2009, 12/02/2009   Influenza, High Dose Seasonal PF 01/16/2015, 11/19/2015, 01/11/2017, 12/20/2017   Influenza,inj,Quad PF,6+ Mos 11/28/2012, 01/02/2014, 02/19/2019   Janssen (J&J) SARS-COV-2 Vaccination 05/30/2019   Pneumococcal Conjugate-13 01/02/2014   Pneumococcal Polysaccharide-23 05/22/2006   Td 01/12/2008    {TDAP status:2101805}  {Flu Vaccine status:2101806}  {Pneumococcal vaccine status:2101807}  {Covid-19 vaccine status:2101808}  Qualifies for Shingles Vaccine? {YES/NO:21197}  Zostavax completed {YES/NO:21197}  {Shingrix Completed?:2101804}  Screening Tests Health Maintenance  Topic Date  Due   Zoster Vaccines- Shingrix (1 of 2) Never done   COLONOSCOPY (Pts 45-63yrs Insurance coverage will need to be confirmed)  07/28/2016   TETANUS/TDAP  01/11/2018   COVID-19 Vaccine (2 - Booster for Janssen series) 07/25/2019   INFLUENZA VACCINE  10/20/2020   PNA vac Low Risk Adult  Completed   HPV VACCINES  Aged Out    Health Maintenance  Health Maintenance Due  Topic Date Due   Zoster Vaccines- Shingrix (1 of 2) Never done   COLONOSCOPY (Pts 45-38yrs Insurance coverage will need to be confirmed)  07/28/2016   TETANUS/TDAP  01/11/2018   COVID-19 Vaccine (2 - Booster for Janssen series) 07/25/2019    {Colorectal cancer screening:2101809}  Lung Cancer Screening: (Low Dose CT Chest recommended if Age 46-80 years, 30 pack-year currently smoking OR have quit w/in 15years.) {DOES NOT does:27190::"does not"} qualify.   Lung Cancer Screening Referral: ***  Additional Screening:  Hepatitis C Screening: {DOES NOT does:27190::"does not"} qualify; Completed ***  Vision Screening: Recommended annual ophthalmology exams for early detection of glaucoma and other disorders of the eye. Is the patient up to date with their annual eye exam?  {YES/NO:21197} Who is the provider or what is the name of the office in which the patient attends annual eye exams? *** If pt is not established with a provider, would they like to be referred to a provider to establish care? {YES/NO:21197}.   Dental Screening: Recommended annual dental exams for proper oral hygiene  Community Resource Referral / Chronic Care Management: CRR required this visit?  {YES/NO:21197}  CCM required this visit?  {YES/NO:21197}     Plan:     I have personally reviewed and noted the following in the patient's chart:   Medical and social history Use of alcohol, tobacco or illicit drugs  Current medications and supplements including opioid prescriptions. {Opioid Prescriptions:[phone]} Functional ability and  status Nutritional status Physical activity Advanced directives List of other physicians Hospitalizations, surgeries, and ER visits in previous 12 months Vitals Screenings to include cognitive, depression, and falls Referrals and appointments  In addition, I have reviewed and discussed with patient certain preventive protocols, quality metrics, and best practice recommendations. A written personalized care plan for preventive services as well as general preventive health recommendations were provided to patient.     Randel Pigg, LPN   03/23/4578   Nurse Notes: ***

## 2020-09-24 ENCOUNTER — Encounter: Payer: Self-pay | Admitting: Podiatry

## 2020-09-24 ENCOUNTER — Ambulatory Visit (INDEPENDENT_AMBULATORY_CARE_PROVIDER_SITE_OTHER): Payer: Medicare PPO | Admitting: Podiatry

## 2020-09-24 ENCOUNTER — Other Ambulatory Visit: Payer: Self-pay

## 2020-09-24 DIAGNOSIS — M79674 Pain in right toe(s): Secondary | ICD-10-CM

## 2020-09-24 DIAGNOSIS — B351 Tinea unguium: Secondary | ICD-10-CM

## 2020-09-24 DIAGNOSIS — I70219 Atherosclerosis of native arteries of extremities with intermittent claudication, unspecified extremity: Secondary | ICD-10-CM

## 2020-09-24 DIAGNOSIS — M79675 Pain in left toe(s): Secondary | ICD-10-CM | POA: Diagnosis not present

## 2020-09-27 NOTE — Progress Notes (Signed)
  Subjective:  Patient ID: Gabriel Love, male    DOB: 02/01/1941,  MRN: [id number]  Gabriel Love presents to clinic today for for at risk foot care. Patient has h/o PAD and painful thick toenails that are difficult to trim. Pain interferes with ambulation. Aggravating factors include wearing enclosed shoe gear. Pain is relieved with periodic professional debridement.  PCP is Laurey Morale, MD , and last visit was 05/12/2020.  Allergies  Allergen Reactions   Ivp Dye [Iodinated Diagnostic Agents] Anaphylaxis    Iv dye allergy in 1985, anaphylaxix//a.calhoun- "shocked back to life"   Penicillins Other (See Comments)    Urticaria Has patient had a PCN reaction causing immediate rash, facial/tongue/throat swelling, SOB or lightheadedness with hypotension: no Has patient had a PCN reaction causing severe rash involving mucus membranes or skin necrosis: no Has patient had a PCN reaction that required hospitalization no Has patient had a PCN reaction occurring within the last 10 years: yes If all of the above answers are "NO", then may proceed with Cephalosporin use.    Lisinopril Cough    Review of Systems: Negative except as noted in the HPI. Objective:   Constitutional Gabriel Love is a pleasant 80 y.o. Caucasian male, morbidly obese in NAD. AAO x 3.   Vascular Capillary fill time to digits <3 seconds b/l lower extremities. Palpable DP pulse(s) b/l lower extremities Nonpalpable PT pulse(s) b/l lower extremities. Pedal hair absent. Lower extremity skin temperature gradient within normal limits. No pain with calf compression b/l. Trace edema noted b/l lower extremities. No cyanosis or clubbing noted.  Neurologic Normal speech. Oriented to person, place, and time. Protective sensation intact 5/5 intact bilaterally with 10g monofilament b/l. Vibratory sensation intact b/l.  Dermatologic Pedal skin with normal turgor, texture and tone b/l lower extremities No open wounds b/l lower extremities  No interdigital macerations b/l lower extremities Toenails 1-5 b/l elongated, discolored, dystrophic, thickened, crumbly with subungual debris and tenderness to dorsal palpation.  Orthopedic: No pain crepitus or joint limitation noted with ROM b/l. No gross bony deformities bilaterally. Pes planus deformity noted LLE.   Radiographs: None Assessment:   1. Pain due to onychomycosis of toenails of both feet   2. Atherosclerotic peripheral vascular disease with intermittent claudication (HCC)    Plan:  -Toenails 1-5 b/l were debrided in length and girth with sterile nail nippers and dremel without iatrogenic bleeding.  -Patient to report any pedal injuries to medical professional immediately. -Patient/POA to call should there be question/concern in the interim.  Return in about 3 months (around 12/25/2020) for nail trim.  Marzetta Board, DPM

## 2020-11-16 ENCOUNTER — Other Ambulatory Visit: Payer: Self-pay | Admitting: Family Medicine

## 2020-12-01 ENCOUNTER — Telehealth: Payer: Self-pay | Admitting: Family Medicine

## 2020-12-01 NOTE — Telephone Encounter (Signed)
Left message for patient to call back and schedule Medicare Annual Wellness Visit (AWV) either virtually or in office. Left  my Gabriel Love number [phone]   AWV-S per [place] 11/21/10/rbh please schedule at anytime with [facility] Nurse Health Advisor 1 or 2   This should be a 45 minute visit.

## 2020-12-02 ENCOUNTER — Other Ambulatory Visit: Payer: Self-pay | Admitting: Family Medicine

## 2020-12-15 ENCOUNTER — Ambulatory Visit: Payer: Medicare PPO | Admitting: Family Medicine

## 2020-12-15 ENCOUNTER — Encounter: Payer: Self-pay | Admitting: Family Medicine

## 2020-12-15 ENCOUNTER — Other Ambulatory Visit: Payer: Self-pay

## 2020-12-15 VITALS — BP 128/82 | HR 61 | Temp 97.9°F | Wt 330.0 lb

## 2020-12-15 DIAGNOSIS — I10 Essential (primary) hypertension: Secondary | ICD-10-CM | POA: Diagnosis not present

## 2020-12-15 DIAGNOSIS — R600 Localized edema: Secondary | ICD-10-CM

## 2020-12-15 DIAGNOSIS — F418 Other specified anxiety disorders: Secondary | ICD-10-CM | POA: Diagnosis not present

## 2020-12-15 DIAGNOSIS — E876 Hypokalemia: Secondary | ICD-10-CM

## 2020-12-15 DIAGNOSIS — J069 Acute upper respiratory infection, unspecified: Secondary | ICD-10-CM | POA: Diagnosis not present

## 2020-12-15 LAB — BASIC METABOLIC PANEL
BUN: 16 mg/dL (ref 6–23)
CO2: 30 mEq/L (ref 19–32)
Calcium: 8.5 mg/dL (ref 8.4–10.5)
Chloride: 103 mEq/L (ref 96–112)
Creatinine, Ser: 0.77 mg/dL (ref 0.40–1.50)
GFR: 84.02 mL/min (ref >60.00)
Glucose, Bld: 88 mg/dL (ref 70–99)
Potassium: 4 mEq/L (ref 3.5–5.1)
Sodium: 141 mEq/L (ref 135–145)

## 2020-12-15 MED ORDER — BUPROPION HCL ER (XL) 150 MG PO TB24: ORAL_TABLET | ORAL | 3 refills | Status: DC

## 2020-12-15 MED ORDER — LEXAPRO 20 MG PO TABS: ORAL_TABLET | ORAL | 3 refills | Status: DC

## 2020-12-15 MED ORDER — FUROSEMIDE 40 MG PO TABS: ORAL_TABLET | ORAL | 3 refills | Status: DC

## 2020-12-15 NOTE — Progress Notes (Signed)
   Subjective:    Patient ID: Gabriel Love, male    DOB: Jan 15, 1941, 80 y.o.   MRN: [id number]  HPI Here to follow up on issues. He feels fine except he has had a dry cough for a few days. His wife is getting over a "cold" and he thinks he got it as well. No chest pain or fever. Using his inhaler once a day. His moods have been stable. He has lost 2 lbs since February.    Review of Systems  Constitutional: Negative.   HENT:  Positive for congestion. Negative for postnasal drip and sore throat.   Eyes: Negative.   Respiratory:  Positive for cough and shortness of breath. Negative for wheezing.   Cardiovascular: Negative.   Psychiatric/Behavioral: Negative.        Objective:   Physical Exam Constitutional:      Appearance: He is not ill-appearing.     Comments: Using his walker   HENT:     Right Ear: Tympanic membrane, ear canal and external ear normal.     Left Ear: Tympanic membrane, ear canal and external ear normal.     Nose: Nose normal.     Mouth/Throat:     Pharynx: Oropharynx is clear.  Eyes:     Conjunctiva/sclera: Conjunctivae normal.  Cardiovascular:     Rate and Rhythm: Normal rate and regular rhythm.     Pulses: Normal pulses.     Heart sounds: Normal heart sounds.  Pulmonary:     Effort: Pulmonary effort is normal. No respiratory distress.     Breath sounds: Rhonchi present. No wheezing or rales.  Lymphadenopathy:     Cervical: No cervical adenopathy.  Neurological:     Mental Status: He is alert.  Psychiatric:        Mood and Affect: Mood normal.        Behavior: Behavior normal.        Thought Content: Thought content normal.          Assessment & Plan:  His depression and anxiety are stable, so we refilled the Lexapro and the Wellbutrin. His edema is well controlled with BID Lasix. We will check a potassium level today. Leg edema is stable. He has a viral URI so he can take Mucinex BID as needed. We spent 35 minutes reviewing records and discussing  these issues.  Alysia Penna, MD  Alysia Penna, MD

## 2020-12-17 ENCOUNTER — Other Ambulatory Visit: Payer: Self-pay | Admitting: Family Medicine

## 2020-12-29 ENCOUNTER — Ambulatory Visit: Payer: Medicare PPO | Admitting: Podiatry

## 2020-12-29 ENCOUNTER — Other Ambulatory Visit: Payer: Self-pay

## 2020-12-29 DIAGNOSIS — M79675 Pain in left toe(s): Secondary | ICD-10-CM

## 2020-12-29 DIAGNOSIS — M79674 Pain in right toe(s): Secondary | ICD-10-CM

## 2020-12-29 DIAGNOSIS — I70219 Atherosclerosis of native arteries of extremities with intermittent claudication, unspecified extremity: Secondary | ICD-10-CM | POA: Diagnosis not present

## 2020-12-29 DIAGNOSIS — B351 Tinea unguium: Secondary | ICD-10-CM | POA: Diagnosis not present

## 2021-01-02 ENCOUNTER — Encounter: Payer: Self-pay | Admitting: Podiatry

## 2021-01-02 NOTE — Progress Notes (Signed)
  Subjective:  Patient ID: Gabriel Love, male    DOB: 19-Mar-1941,  MRN: [id number]  80 y.o. male presents with for at risk foot care. Patient has h/o PAD and thick, elongated toenails b/l feet which are tender when wearing enclosed shoe gear.Marland Kitchen    PCP: Laurey Morale, MD and last visit was: 12/15/2020.  Review of Systems: Negative except as noted in the HPI.   Allergies  Allergen Reactions   Ivp Dye [Iodinated Diagnostic Agents] Anaphylaxis    Iv dye allergy in 1985, anaphylaxix//a.calhoun- "shocked back to life"   Penicillins Other (See Comments)    Urticaria Has patient had a PCN reaction causing immediate rash, facial/tongue/throat swelling, SOB or lightheadedness with hypotension: no Has patient had a PCN reaction causing severe rash involving mucus membranes or skin necrosis: no Has patient had a PCN reaction that required hospitalization no Has patient had a PCN reaction occurring within the last 10 years: yes If all of the above answers are "NO", then may proceed with Cephalosporin use.    Lisinopril Cough    Objective:  There were no vitals filed for this visit. Constitutional Patient is a pleasant 80 y.o. Caucasian male morbidly obese in NAD. AAO x 3.  Vascular Capillary fill time to digits <3 seconds b/l lower extremities. Faintly palpable DP pulse(s) b/l lower extremities. Nonpalpable PT pulse(s) b/l lower extremities. Pedal hair absent. Lower extremity skin temperature gradient within normal limits. No pain with calf compression b/l. No cyanosis or clubbing noted.   Neurologic Normal speech. Protective sensation intact 5/5 intact bilaterally with 10g monofilament b/l. Vibratory sensation intact b/l.  Dermatologic Pedal skin is thin shiny, atrophic b/l lower extremities. No open wounds b/l lower extremities. No interdigital macerations b/l lower extremities. Toenails 1-5 b/l elongated, discolored, dystrophic, thickened, crumbly with subungual debris and tenderness to dorsal  palpation.   Orthopedic: Normal muscle strength 5/5 to all lower extremity muscle groups bilaterally. Pes planus deformity noted b/l lower extremities.   Hemoglobin A1C Latest Ref Rng & Units 01/21/2020  HGBA1C <5.7 % of total Hgb 5.3  Some recent data might be hidden   Assessment:   1. Pain due to onychomycosis of toenails of both feet   2. Atherosclerotic peripheral vascular disease with intermittent claudication ([place])    Plan:  Patient was evaluated and treated and all questions answered. Consent given for treatment as described below: -No new findings. No new orders. -Patient to continue soft, supportive shoe gear daily. -Toenails 1-5 b/l were debrided in length and girth with sterile nail nippers and dremel without iatrogenic bleeding.  -Patient to report any pedal injuries to medical professional immediately. -Patient/POA to call should there be question/concern in the interim.  Return in about 3 months (around 03/31/2021).  Marzetta Board, DPM

## 2021-01-17 ENCOUNTER — Other Ambulatory Visit: Payer: Self-pay | Admitting: Family Medicine

## 2021-01-19 ENCOUNTER — Telehealth: Payer: Self-pay | Admitting: Family Medicine

## 2021-01-19 NOTE — Telephone Encounter (Signed)
Left message for patient to call back and schedule Medicare Annual Wellness Visit (AWV) either virtually or in office. Left  my Herbie Drape number [phone]   AWV-S per [place] 11/21/10  please schedule at anytime with [facility] Nurse Health Advisor 1 or 2   This should be a 45 minute visit.   Per laura phone note patient no showed for 09/23/20 appt

## 2021-02-11 DIAGNOSIS — H02834 Dermatochalasis of left upper eyelid: Secondary | ICD-10-CM | POA: Diagnosis not present

## 2021-02-11 DIAGNOSIS — H02831 Dermatochalasis of right upper eyelid: Secondary | ICD-10-CM | POA: Diagnosis not present

## 2021-02-11 DIAGNOSIS — Z961 Presence of intraocular lens: Secondary | ICD-10-CM | POA: Diagnosis not present

## 2021-02-11 DIAGNOSIS — H26491 Other secondary cataract, right eye: Secondary | ICD-10-CM | POA: Diagnosis not present

## 2021-02-21 ENCOUNTER — Ambulatory Visit (INDEPENDENT_AMBULATORY_CARE_PROVIDER_SITE_OTHER): Payer: Medicare PPO

## 2021-02-21 ENCOUNTER — Other Ambulatory Visit: Payer: Self-pay

## 2021-02-21 ENCOUNTER — Ambulatory Visit
Admission: EM | Admit: 2021-02-21 | Discharge: 2021-02-21 | Disposition: A | Discharge status: Home / Self Care | Payer: Medicare PPO | Attending: Physician Assistant | Admitting: Physician Assistant

## 2021-02-21 DIAGNOSIS — J019 Acute sinusitis, unspecified: Secondary | ICD-10-CM

## 2021-02-21 DIAGNOSIS — R051 Acute cough: Secondary | ICD-10-CM | POA: Diagnosis not present

## 2021-02-21 DIAGNOSIS — R059 Cough, unspecified: Secondary | ICD-10-CM

## 2021-02-21 DIAGNOSIS — R062 Wheezing: Secondary | ICD-10-CM

## 2021-02-21 MED ORDER — DOXYCYCLINE HYCLATE 100 MG PO CAPS: 100.0000 mg | ORAL_CAPSULE | Freq: Two times a day (BID) | ORAL | 0 refills | Status: DC

## 2021-02-21 MED ORDER — PREDNISONE 10 MG (21) PO TBPK: ORAL_TABLET | Freq: Every day | ORAL | 0 refills | Status: DC

## 2021-02-21 NOTE — ED Provider Notes (Signed)
[facility]    CSN: [id number] Arrival date & time: 02/21/21  0857      History   Chief Complaint No chief complaint on file.   HPI Gabriel Love is a 80 y.o. male.   Pt complains of congestion, "chest congestion", wheezing, cough, and sinus pressure that started over a week ago.  He is taking Mucinex with temporary relief.  Denies fever, chills, night sweats.  Wife sick with similar sx about one week ago, her sx have resolved. He has an inhaler prescribed, but has not used it.    Past Medical History:  Diagnosis Date   Abdominal hernia    Allergy    Blood transfusion without reported diagnosis 12/2006   At [facility] with hip replacement - 2 units transfused   Cancer ([place])    skin cancers   Cataract    Depression    DJD (degenerative joint disease)    shoulders bilateral   Environmental allergies    Full dentures    Grade I diastolic dysfunction 3/53/6144   Hearing loss    bilateral hearing aids   History of colonic polyps    History of urinary tract infection    Hyperlipidemia    under control   Hypertension    "borderline"   Nonspecific abnormal finding in stool contents    Other general symptoms(780.99)    Prostate hypertrophy    sees Dr. Gaynelle Arabian    Urinary leakage     Patient Active Problem List   Diagnosis Date Noted   Acute gastroenteritis 05/09/2020   Sepsis secondary to UTI ([place]) 05/08/2020   Grade I diastolic dysfunction 31/54/0086   Class 1 obesity 05/08/2020   Hyponatremia 05/08/2020   Hypokalemia 05/08/2020   Hypoalbuminemia 05/08/2020   Mild protein malnutrition ([place]) 05/08/2020   Depression with anxiety 07/24/2019   Morbid obesity ([place]) 04/04/2018   Bilateral leg edema 02/07/2017   Osteoarthritis 01/28/2017   Urge and stress incontinence 01/28/2017   History of colonic polyps    Colon polyp 08/07/2013   Benign neoplasm of ileocecal valve, 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   Nonspecific abnormal finding in stool contents 05/29/2013    Atherosclerotic peripheral vascular disease with intermittent claudication ([place]) 06/14/2012   Spinal stenosis of lumbar region 06/14/2012   DEGENERATIVE DISC DISEASE, 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   Hyperlipidemia 11/23/2006   Essential hypertension 11/23/2006   HEMATURIA 11/23/2006   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    Past Surgical History:  Procedure Laterality Date   CATARACT EXTRACTION W/PHACO Right 03/25/2014   Procedure: CATARACT EXTRACTION PHACO AND INTRAOCULAR LENS PLACEMENT; CDE:9.76;  Surgeon: Williams Che, MD;  Location: [facility];  Service: Ophthalmology;  Laterality: Right;   CATARACT EXTRACTION W/PHACO Left 06/24/2014   Procedure: CATARACT EXTRACTION PHACO AND INTRAOCULAR LENS PLACEMENT (IOC);  Surgeon: Williams Che, MD;  Location: [facility];  Service: Ophthalmology;  Laterality: Left;  CDE:8.45   CIRCUMCISION N/A 03/26/2016   Procedure: CIRCUMCISION ADULT;  Surgeon: Carolan Clines, MD;  Location: [facility];  Service: Urology;  Laterality: N/ADenton Meek, [place]   COLONOSCOPY  06-19-13, 03/05/15   per Dr. 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 Percell Miller  LAPAROSCOPIC PARTIAL COLECTOMY N/A 08/07/2013   Procedure: LAPAROSCOPIC right PARTIAL COLECTOMY;  Surgeon: Leighton Ruff, MD;  Location: [facility];  Service: General;  Laterality: N/A;   MULTIPLE TOOTH EXTRACTIONS     PROSTATE SURGERY  03-29-11   had CTT per Dr. Gaynelle Arabian, in office       Home Medications    Prior to Admission medications   Medication Sig Start Date End Date Taking? Authorizing Provider  doxycycline (VIBRAMYCIN) 100 MG capsule Take 1 capsule (100 mg total) by mouth 2 (two) times  daily. 02/21/21  Yes Ward, Lenise Arena, PA-C  predniSONE (STERAPRED UNI-PAK 21 TAB) 10 MG (21) TBPK tablet Take by mouth daily. Take 6 tabs by mouth daily  for 2 days, then 5 tabs for 2 days, then 4 tabs for 2 days, then 3 tabs for 2 days, 2 tabs for 2 days, then 1 tab by mouth daily for 2 days 02/21/21  Yes Ward, Lenise Arena, PA-C  albuterol (VENTOLIN HFA) 108 (90 Base) MCG/ACT inhaler Inhale 2 puffs into the lungs every 6 (six) hours as needed for wheezing or shortness of breath. 05/10/20   Barton Dubois, MD  Ascorbic Acid (VITAMIN C PO) Take 1 tablet by mouth daily.    [provider]  aspirin EC 81 MG tablet Take 81 mg by mouth daily.    [provider]  atorvastatin (LIPITOR) 40 MG tablet TAKE 1 TABLET(40 MG) BY MOUTH DAILY 12/17/20   Laurey Morale, MD  buPROPion (WELLBUTRIN XL) 150 MG 24 hr tablet TAKE 1 TABLET(150 MG) BY MOUTH DAILY 12/15/20   Laurey Morale, MD  clotrimazole-betamethasone (LOTRISONE) cream Apply 1 application topically daily. Applies to affected area. 02/10/15   [provider]  furosemide (LASIX) 40 MG tablet TAKE 1 TABLET(40 MG) BY MOUTH TWICE DAILY 12/15/20   Laurey Morale, MD  LEXAPRO 20 MG tablet TAKE 1 TABLET(20 MG) BY MOUTH DAILY 12/15/20   Laurey Morale, MD  metoprolol succinate (TOPROL-XL) 50 MG 24 hr tablet TAKE 1 TABLET BY MOUTH EVERY DAY 09/10/20   Laurey Morale, MD  Multiple Vitamins-Minerals (CENTRUM ADULTS PO) Take 1 tablet by mouth daily.    [provider]  mupirocin ointment (BACTROBAN) 2 % Apply topically 2 (two) times daily. 12/20/18   Laurey Morale, MD  potassium chloride SA (KLOR-CON) 20 MEQ tablet TAKE 1 TABLET(20 MEQ) BY MOUTH DAILY 08/07/20   Laurey Morale, MD    Family History Family History  Problem Relation Age of Onset   Hypertension Mother    Diabetes Mother    Heart failure Mother        cad/mi with rupture ventricle   Hyperlipidemia Father    Colon polyps Father    Cancer Neg Hx        colon or prostate    Colon cancer Neg Hx    Esophageal cancer Neg Hx    Rectal cancer Neg Hx    Stomach cancer Neg Hx     Social History Social History   Tobacco Use   Smoking status: Former    Packs/day: 1.00    Years: 5.00    Pack years: 5.00    Types: Cigarettes    Quit date: 03/22/1968    Years since quitting: 52.9   Smokeless tobacco: Former    Types: Chew    Quit date: 09/20/2015   Tobacco comments:    occassionally  Substance Use Topics   Alcohol use: No    Alcohol/week: 0.0  standard drinks   Drug use: No     Allergies   Ivp dye [iodinated diagnostic agents], Penicillins, and Lisinopril   Review of Systems Review of Systems  Constitutional:  Negative for chills and fever.  HENT:  Positive for congestion and sinus pressure. Negative for ear pain and sore throat.   Eyes:  Negative for pain and visual disturbance.  Respiratory:  Positive for cough and wheezing. Negative for shortness of breath.   Cardiovascular:  Negative for chest pain and palpitations.  Gastrointestinal:  Negative for abdominal pain and vomiting.  Genitourinary:  Negative for dysuria and hematuria.  Musculoskeletal:  Negative for arthralgias and back pain.  Skin:  Negative for color change and rash.  Neurological:  Negative for seizures and syncope.  All other systems reviewed and are negative.   Physical Exam Triage Vital Signs ED Triage Vitals  Enc Vitals Group     BP 02/21/21 1055 136/78     Pulse Rate 02/21/21 1055 89     Resp 02/21/21 1055 20     Temp 02/21/21 1055 98.2 F (36.8 C)     Temp Source 02/21/21 1055 Oral     SpO2 02/21/21 1055 90 %     Weight --      Height --      Head Circumference --      Peak Flow --      Pain Score 02/21/21 1051 0     Pain Loc --      Pain Edu? --      Excl. in [place]? --    No data found.  Updated Vital Signs BP 136/78 (BP Location: Right Arm)   Pulse 89   Temp 98.2 F (36.8 C) (Oral)   Resp 20   SpO2 90%   Visual Acuity Right Eye Distance:   Left Eye  Distance:   Bilateral Distance:    Right Eye Near:   Left Eye Near:    Bilateral Near:     Physical Exam Vitals and nursing note reviewed.  Constitutional:      General: He is not in acute distress.    Appearance: He is well-developed.  HENT:     Head: Normocephalic and atraumatic.  Eyes:     Conjunctiva/sclera: Conjunctivae normal.  Cardiovascular:     Rate and Rhythm: Normal rate and regular rhythm.     Heart sounds: No murmur heard. Pulmonary:     Effort: Pulmonary effort is normal. No respiratory distress.     Breath sounds: Examination of the right-middle field reveals wheezing. Examination of the left-middle field reveals wheezing. Examination of the right-lower field reveals rhonchi. Examination of the left-lower field reveals rhonchi. Wheezing and rhonchi present.  Abdominal:     Palpations: Abdomen is soft.     Tenderness: There is no abdominal tenderness.  Musculoskeletal:        General: No swelling.     Cervical back: Neck supple.  Skin:    General: Skin is warm and dry.     Capillary Refill: Capillary refill takes less than 2 seconds.  Neurological:     Mental Status: He is alert.  Psychiatric:        Mood and Affect: Mood normal.     UC Treatments / Results  Labs (all labs ordered are listed, but only abnormal results are displayed) Labs Reviewed - No data to display  EKG   Radiology DG Chest 2 View  Result Date: 02/21/2021 CLINICAL DATA:  Cough and wheezing. EXAM:  CHEST - 2 VIEW COMPARISON:  05/08/2020 FINDINGS: The lungs are clear without focal pneumonia, edema, pneumothorax or pleural effusion. The cardiopericardial silhouette is within normal limits for size. The visualized bony structures of the thorax show no acute abnormality. Degenerative changes again noted both shoulders. IMPRESSION: No active cardiopulmonary disease. Electronically Signed   By: Misty Stanley M.D.   On: 02/21/2021 11:36    Procedures Procedures (including critical care  time)  Medications Ordered in UC Medications - No data to display  Initial Impression / Assessment and Plan / UC Course  I have reviewed the triage vital signs and the nursing notes.  Pertinent labs & imaging results that were available during my care of the patient were reviewed by me and considered in my medical decision making (see chart for details).     Sinusitis, cough. Chest xray negative. Antibiotic prescribed, prednisone prescribed. Advised to use inhaler as needed. Suportive care discussed. Return precautions discussed.  O2 91-93% on room air, pt in no acute distress.  Stable for discharge.  Final Clinical Impressions(s) / UC Diagnoses   Final diagnoses:  Acute non-recurrent sinusitis, unspecified location  Acute cough     Discharge Instructions      Recommend use of inhaler as needed Take antibiotics as prescribed Drink plenty of fluids Return if symptoms become worse.    ED Prescriptions     Medication Sig Dispense Auth. Provider   doxycycline (VIBRAMYCIN) 100 MG capsule Take 1 capsule (100 mg total) by mouth 2 (two) times daily. 20 capsule Ward, Lenise Arena, PA-C   predniSONE (STERAPRED UNI-PAK 21 TAB) 10 MG (21) TBPK tablet Take by mouth daily. Take 6 tabs by mouth daily  for 2 days, then 5 tabs for 2 days, then 4 tabs for 2 days, then 3 tabs for 2 days, 2 tabs for 2 days, then 1 tab by mouth daily for 2 days 42 tablet Ward, Lenise Arena, PA-C      PDMP not reviewed this encounter.   Ward, Lenise Arena, PA-C 02/21/21 1206

## 2021-02-21 NOTE — ED Triage Notes (Signed)
Patient states that last week he started getting sick and he started Mucinex. The Mucinex broke some of the congestion up but he is still wheezing and feeling bad.   Denies Fever.   Denies Exposure.

## 2021-02-21 NOTE — Discharge Instructions (Signed)
Recommend use of inhaler as needed Take antibiotics as prescribed Drink plenty of fluids Return if symptoms become worse.

## 2021-02-25 ENCOUNTER — Telehealth: Payer: Self-pay | Admitting: Family Medicine

## 2021-02-25 DIAGNOSIS — H26492 Other secondary cataract, left eye: Secondary | ICD-10-CM | POA: Diagnosis not present

## 2021-02-25 NOTE — Telephone Encounter (Signed)
Left message for patient to call back and schedule Medicare Annual Wellness Visit (AWV) either virtually or in office. Left  my Gabriel Love number [phone]   AWV-S per PALMETTO 11/21/10 please schedule at anytime with [facility] Nurse Health Advisor 1 or 2   This should be a 45 minute visit.

## 2021-02-26 ENCOUNTER — Other Ambulatory Visit: Payer: Self-pay | Admitting: Family Medicine

## 2021-03-31 ENCOUNTER — Ambulatory Visit: Payer: Medicare PPO | Admitting: Family Medicine

## 2021-03-31 ENCOUNTER — Encounter: Payer: Self-pay | Admitting: Family Medicine

## 2021-03-31 VITALS — BP 120/78 | HR 77 | Temp 98.6°F | Wt 336.0 lb

## 2021-03-31 DIAGNOSIS — L03115 Cellulitis of right lower limb: Secondary | ICD-10-CM

## 2021-03-31 MED ORDER — MUPIROCIN CALCIUM 2 % EX CREA: 1.0000 "application " | TOPICAL_CREAM | Freq: Two times a day (BID) | CUTANEOUS | 5 refills | Status: DC

## 2021-03-31 MED ORDER — CLOTRIMAZOLE-BETAMETHASONE 1-0.05 % EX CREA: 1.0000 "application " | TOPICAL_CREAM | Freq: Every day | CUTANEOUS | 5 refills | Status: DC

## 2021-03-31 MED ORDER — CEFTRIAXONE SODIUM 1 G IJ SOLR
1.0000 g | Freq: Once | INTRAMUSCULAR | Status: AC
  Administered 2021-03-31: 1 g via INTRAMUSCULAR

## 2021-03-31 MED ORDER — CEPHALEXIN 500 MG PO CAPS: 500.0000 mg | ORAL_CAPSULE | Freq: Four times a day (QID) | ORAL | 0 refills | Status: DC

## 2021-03-31 NOTE — Progress Notes (Signed)
° °  Subjective:    Patient ID: Gabriel Love, male    DOB: 1940-04-22, 81 y.o.   MRN: [id number]  HPI Here with his wife for 5 days of redness and swelling on the right lower leg. There has been clear fluid dripping from some areas. There is no pain and no fever. He has had cellulitis like this several times in the past few years.    Review of Systems  Constitutional: Negative.   Respiratory: Negative.    Cardiovascular:  Positive for leg swelling. Negative for chest pain and palpitations.  Skin:  Positive for color change.      Objective:   Physical Exam Constitutional:      Appearance: Normal appearance. He is not ill-appearing.  Cardiovascular:     Rate and Rhythm: Normal rate and regular rhythm.     Pulses: Normal pulses.     Heart sounds: Normal heart sounds.  Pulmonary:     Effort: Pulmonary effort is normal.     Breath sounds: Normal breath sounds.  Skin:    Comments: Right lower leg is swollen, red, and warm. Numerous areas have serous fluid draining from them. There is no tenderness.   Neurological:     Mental Status: He is alert.          Assessment & Plan:  Cellulitis. He is given a shot of Rocephin. He will follow this with 14 days of Keflex QID, and he will apply Mupiricin ointment BID. Recheck as needed.  Alysia Penna, MD

## 2021-03-31 NOTE — Addendum Note (Signed)
Addended by: Wyvonne Lenz on: 03/31/2021 04:08 PM   Modules accepted: Orders

## 2021-04-07 ENCOUNTER — Ambulatory Visit: Payer: Medicare PPO | Admitting: Podiatry

## 2021-04-24 ENCOUNTER — Ambulatory Visit: Payer: Medicare PPO | Admitting: Family

## 2021-04-28 ENCOUNTER — Ambulatory Visit: Payer: Medicare PPO | Admitting: Orthopedic Surgery

## 2021-04-28 ENCOUNTER — Other Ambulatory Visit: Payer: Self-pay

## 2021-04-28 ENCOUNTER — Ambulatory Visit (INDEPENDENT_AMBULATORY_CARE_PROVIDER_SITE_OTHER): Payer: Medicare PPO

## 2021-04-28 DIAGNOSIS — M25572 Pain in left ankle and joints of left foot: Secondary | ICD-10-CM

## 2021-04-28 DIAGNOSIS — S93432S Sprain of tibiofibular ligament of left ankle, sequela: Secondary | ICD-10-CM

## 2021-04-28 DIAGNOSIS — S8265XA Nondisplaced fracture of lateral malleolus of left fibula, initial encounter for closed fracture: Secondary | ICD-10-CM | POA: Diagnosis not present

## 2021-05-03 ENCOUNTER — Encounter: Payer: Self-pay | Admitting: Orthopedic Surgery

## 2021-05-03 NOTE — Progress Notes (Signed)
Office Visit Note   Patient: Gabriel Love           Date of Birth: 1940-05-14           MRN: [id number] Visit Date: 04/28/2021              Requested by: Laurey Morale, MD [place],  [place] [id number] PCP: Laurey Morale, MD  Chief Complaint  Patient presents with   Left Ankle - Follow-up      HPI: Patient is an 81 year old gentleman who presents in follow-up status post nondisplaced fibular fracture with calcification of the syndesmosis and collapse of the tibiotalar joint into valgus.  Patient has had a prescription for double upright brace his current brace is broken.  Assessment & Plan: Patient is currently on antibiotics. Visit Diagnoses:  1. Syndesmotic disruption of ankle, left, sequela   2. Closed nondisplaced fracture of lateral malleolus of left fibula, initial encounter     Plan: Patient was given a new prescription for [employer]otech for double upright brace extra-depth shoes custom orthotics.  Recommended exercise and elevation for the venous swelling.  Follow-Up Instructions: Return in about 4 weeks (around 05/26/2021).   Ortho Exam  Patient is alert, oriented, no adenopathy, well-dressed, normal affect, normal respiratory effort. Examination patient has a good dorsalis pedis pulse his foot is externally rotated with valgus of the hindfoot.  His current double upright brace is broken.  There are no open ulcers no cellulitis no signs of infection.  Imaging: No results found. No images are attached to the encounter.  Labs: Lab Results  Component Value Date   HGBA1C 5.3 01/21/2020   HGBA1C 5.5 02/19/2019   HGBA1C 5.6 02/28/2015   ESRSEDRATE 14 06/06/2013   REPTSTATUS 05/13/2020 FINAL 05/08/2020   CULT  05/08/2020    NO GROWTH 5 DAYS Performed at [facility], [address], [place], [place] [address]    LABORGA PSEUDOMONAS AERUGINOSA (A) 05/08/2020     Lab Results  Component Value Date   ALBUMIN 2.8 (L) 05/09/2020   ALBUMIN 3.3 (L)  05/08/2020   ALBUMIN 3.7 02/19/2019    Lab Results  Component Value Date   MG 1.7 05/08/2020   Lab Results  Component Value Date   VD25OH 44.91 05/09/2020    No results found for: PREALBUMIN CBC EXTENDED Latest Ref Rng & Units 05/10/2020 05/09/2020 05/08/2020  WBC 4.0 - 10.5 K/uL 7.4 7.4 8.4  RBC 4.22 - 5.81 MIL/uL 4.29 5.08 4.96  HGB 13.0 - 17.0 g/dL 12.6(L) 14.7 14.5  HCT 39.0 - 52.0 % 39.8 47.1 45.7  PLT 150 - 400 K/uL 156 126(L) 158  NEUTROABS 1.7 - 7.7 K/uL 5.1 5.3 6.9  LYMPHSABS 0.7 - 4.0 K/uL 1.4 1.4 1.0     There is no height or weight on file to calculate BMI.  Orders:  Orders Placed This Encounter  Procedures   XR Ankle Complete Left   No orders of the defined types were placed in this encounter.    Procedures: No procedures performed  Clinical Data: No additional findings.  ROS:  All other systems negative, except as noted in the HPI. Review of Systems  Objective: Vital Signs: There were no vitals taken for this visit.  Specialty Comments:  No specialty comments available.  PMFS History: Patient Active Problem List   Diagnosis Date Noted   Acute gastroenteritis 05/09/2020   Sepsis secondary to UTI ([place]) 05/08/2020   Grade I diastolic dysfunction 51/76/1607   Class  1 obesity 05/08/2020   Hyponatremia 05/08/2020   Hypokalemia 05/08/2020   Hypoalbuminemia 05/08/2020   Mild protein malnutrition ([place]) 05/08/2020   Depression with anxiety 07/24/2019   Morbid obesity ([place]) 04/04/2018   Bilateral leg edema 02/07/2017   Osteoarthritis 01/28/2017   Urge and stress incontinence 01/28/2017   History of colonic polyps    Colon polyp 08/07/2013   Benign neoplasm of ileocecal valve, 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   Nonspecific abnormal finding in stool contents 05/29/2013   Atherosclerotic peripheral vascular disease with intermittent claudication ([place]) 06/14/2012   Spinal stenosis of lumbar region 06/14/2012   DEGENERATIVE [place] DISEASE, LUMBAR SPINE  12/02/2009   Hyperlipidemia 11/23/2006   Essential hypertension 11/23/2006   HEMATURIA 11/23/2006   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   Past Medical History:  Diagnosis Date   Abdominal hernia    Allergy    Blood transfusion without reported diagnosis 12/2006   At [facility] with hip replacement - 2 units transfused   Cancer ([facility])    skin cancers   Cataract    Depression    DJD (degenerative joint disease)    shoulders bilateral   Environmental allergies    Full dentures    Grade I diastolic dysfunction 3/42/8768   Hearing loss    bilateral hearing aids   History of colonic polyps    History of urinary tract infection    Hyperlipidemia    under control   Hypertension    "borderline"   Nonspecific abnormal finding in stool contents    Other general symptoms(780.99)    Prostate hypertrophy    sees Dr. Gaynelle Arabian    Urinary leakage     Family History  Problem Relation Age of Onset   Hypertension Mother    Diabetes Mother    Heart failure Mother        cad/mi with rupture ventricle   Hyperlipidemia Father    Colon polyps Father    Cancer Neg Hx        colon or prostate   Colon cancer Neg Hx    Esophageal cancer Neg Hx    Rectal cancer Neg Hx    Stomach cancer Neg Hx     Past Surgical History:  Procedure Laterality Date   CATARACT EXTRACTION W/PHACO Right 03/25/2014   Procedure: CATARACT EXTRACTION PHACO AND INTRAOCULAR LENS PLACEMENT; CDE:9.76;  Surgeon: Williams Che, MD;  Location: [facility];  Service: Ophthalmology;  Laterality: Right;   CATARACT EXTRACTION W/PHACO Left 06/24/2014   Procedure: CATARACT EXTRACTION PHACO AND INTRAOCULAR LENS PLACEMENT (IOC);  Surgeon: Williams Che, MD;  Location: [facility];  Service: Ophthalmology;  Laterality: Left;  CDE:8.45   CIRCUMCISION N/A 03/26/2016   Procedure: CIRCUMCISION ADULT;  Surgeon: Carolan Clines, MD;  Location[facility];  Service: Urology;  Laterality: N/ADenton Meek, [place]   COLONOSCOPY   06-19-13, 03/05/15   per Dr. 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 Percell Miller    LAPAROSCOPIC PARTIAL COLECTOMY N/A 08/07/2013   Procedure: LAPAROSCOPIC right PARTIAL COLECTOMY;  Surgeon: Leighton Ruff, MD;  Location[facility];  Service: General;  Laterality: N/A;   MULTIPLE TOOTH EXTRACTIONS     PROSTATE SURGERY  03-29-11  had CTT per Dr. Gaynelle Arabian, in office   Social History   Occupational History   Occupation: Retired  Tobacco Use   Smoking status: Former    Packs/day: 1.00    Years: 5.00    Pack years: 5.00    Types: Cigarettes    Quit date: 03/22/1968    Years since quitting: 53.1   Smokeless tobacco: Former    Types: Chew    Quit date: 09/20/2015   Tobacco comments:    occassionally  Substance and Sexual Activity   Alcohol use: No    Alcohol/week: 0.0 standard drinks   Drug use: No   Sexual activity: Not on file

## 2021-05-06 ENCOUNTER — Other Ambulatory Visit: Payer: Self-pay

## 2021-05-06 ENCOUNTER — Ambulatory Visit: Payer: Medicare PPO | Admitting: Podiatry

## 2021-05-06 ENCOUNTER — Encounter: Payer: Self-pay | Admitting: Podiatry

## 2021-05-06 DIAGNOSIS — M79674 Pain in right toe(s): Secondary | ICD-10-CM

## 2021-05-06 DIAGNOSIS — I739 Peripheral vascular disease, unspecified: Secondary | ICD-10-CM | POA: Diagnosis not present

## 2021-05-06 DIAGNOSIS — M79675 Pain in left toe(s): Secondary | ICD-10-CM

## 2021-05-06 DIAGNOSIS — B351 Tinea unguium: Secondary | ICD-10-CM | POA: Diagnosis not present

## 2021-05-12 NOTE — Progress Notes (Signed)
Subjective: Gabriel Love is a 81 y.o. male patient seen today for follow up of  at risk foot care. Patient has history of PVD with intermittent claudication and painful elongated mycotic toenails 1-5 bilaterally which are tender when wearing enclosed shoe gear. Pain is relieved with periodic professional debridement..   New problem(s)/concern(s) today: None. Had dx of cellulitis of RLE in January treated with antibiotics by Dr. Sarajane Jews.  PCP is Laurey Morale, MD. Last visit was: 03/31/2021.  Allergies  Allergen Reactions   Ivp Dye [Iodinated Contrast Media] Anaphylaxis    Iv dye allergy in 1985, anaphylaxix//a.calhoun- "shocked back to life"   Penicillins Other (See Comments)    Urticaria Has patient had a PCN reaction causing immediate rash, facial/tongue/throat swelling, SOB or lightheadedness with hypotension: no Has patient had a PCN reaction causing severe rash involving mucus membranes or skin necrosis: no Has patient had a PCN reaction that required hospitalization no Has patient had a PCN reaction occurring within the last 10 years: yes If all of the above answers are "NO", then may proceed with Cephalosporin use.    Lisinopril Cough    Objective: Physical Exam  General: Patient is a pleasant 81 y.o. Caucasian male morbidly obese in NAD. AAO x 3.   Neurovascular Examination: CFT <3 seconds b/l LE. Faintly palpable DP pulses b/l LE. Diminished PT pulse(s) b/l LE. Pedal hair absent. No pain with calf compression b/l. Lower extremity skin temperature gradient within normal limits. Nonpitting edema noted BLE. Evidence of chronic venous insufficiency b/l LE. No cyanosis or clubbing noted b/l LE.  Protective sensation intact 5/5 intact bilaterally with 10g monofilament b/l. Vibratory sensation intact b/l.  Dermatological:  Pedal skin thin and atrophic b/l LE. No open wounds b/l LE. No interdigital macerations noted b/l LE. Toenails 1-5 b/l elongated, discolored, dystrophic,  thickened, crumbly with subungual debris and tenderness to dorsal palpation. No hyperkeratotic nor porokeratotic lesions present on today's visit.  Musculoskeletal:  Normal muscle strength 5/5 to all lower extremity muscle groups bilaterally. Pes planus deformity noted bilateral LE.Marland Kitchen No pain, crepitus or joint limitation noted with ROM b/l LE.  Patient ambulates independently without assistive aids.  Assessment: 1. Pain due to onychomycosis of toenails of both feet   2. Peripheral vascular disease with claudication ([place])    Plan: Patient was evaluated and treated and all questions answered. Consent given for treatment as described below: -Examined patient. -Mycotic toenails 1-5 bilaterally were debrided in length and girth with sterile nail nippers and dremel without incident. -Patient/POA to call should there be question/concern in the interim.  Return in about 3 months (around 08/03/2021).  Marzetta Board, DPM

## 2021-06-04 DIAGNOSIS — M21372 Foot drop, left foot: Secondary | ICD-10-CM | POA: Diagnosis not present

## 2021-06-19 ENCOUNTER — Telehealth: Payer: Self-pay | Admitting: Family Medicine

## 2021-06-19 NOTE — Telephone Encounter (Signed)
Left message for patient to call back and schedule Medicare Annual Wellness Visit (AWV) either virtually or in office. Left  my jabber number [phone] ? ? ? AWV-S per PAL[place] 11/21/10- ? please schedule at anytime with [facility] Nurse Health Advisor 1 or 2 ? ? ?This should be a 45 minute visit.  ?

## 2021-07-21 ENCOUNTER — Telehealth: Payer: Self-pay | Admitting: Family Medicine

## 2021-07-21 NOTE — Telephone Encounter (Signed)
Spoke to patient to schedule Medicare Annual Wellness Visit (AWV) either virtually or in office.  ? ?Patient declined stating he doesn't need this appointment ? ? ? Due  AWV-S per PALMET[place] 11/21/10 ? ? ? ? ?

## 2021-07-24 ENCOUNTER — Other Ambulatory Visit: Payer: Self-pay | Admitting: Family Medicine

## 2021-07-24 DIAGNOSIS — E785 Hyperlipidemia, unspecified: Secondary | ICD-10-CM

## 2021-07-27 ENCOUNTER — Ambulatory Visit: Payer: Medicare PPO | Admitting: Podiatry

## 2021-07-27 DIAGNOSIS — B351 Tinea unguium: Secondary | ICD-10-CM | POA: Diagnosis not present

## 2021-07-27 DIAGNOSIS — I739 Peripheral vascular disease, unspecified: Secondary | ICD-10-CM

## 2021-07-27 DIAGNOSIS — M79675 Pain in left toe(s): Secondary | ICD-10-CM | POA: Diagnosis not present

## 2021-07-27 DIAGNOSIS — M79674 Pain in right toe(s): Secondary | ICD-10-CM | POA: Diagnosis not present

## 2021-08-04 ENCOUNTER — Encounter: Payer: Self-pay | Admitting: Podiatry

## 2021-08-04 NOTE — Progress Notes (Signed)
Subjective: ?Gabriel Love is a 81 y.o. male patient seen today for follow up of  at risk foot care. Patient has history of PVD with intermittent claudication and painful elongated mycotic toenails 1-5 bilaterally which are tender when wearing enclosed shoe gear. Pain is relieved with periodic professional debridement..  ? ?New problem(s)/concern(s) today: None.  ? ?PCP is Gabriel Morale, MD. Last visit was: 03/31/2021. ? ?Allergies  ?Allergen Reactions  ? Ivp Dye [Iodinated Contrast Media] Anaphylaxis  ?  Iv dye allergy in 1985, anaphylaxix//a.calhoun- "shocked back to life"  ? Penicillins Other (See Comments)  ?  Urticaria ?Has patient had a PCN reaction causing immediate rash, facial/tongue/throat swelling, SOB or lightheadedness with hypotension: no ?Has patient had a PCN reaction causing severe rash involving mucus membranes or skin necrosis: no ?Has patient had a PCN reaction that required hospitalization no ?Has patient had a PCN reaction occurring within the last 10 years: yes ?If all of the above answers are "NO", then may proceed with Cephalosporin use. ?  ? Lisinopril Cough  ? ? ?Objective: No change in physical examination. ?Physical Exam ? ?General: Patient is a pleasant 81 y.o. Caucasian male morbidly obese in NAD. AAO x 3.  ? ?Neurovascular Examination: ?CFT <3 seconds b/l LE. Faintly palpable DP pulses b/l LE. Diminished PT pulse(s) b/l LE. Pedal hair absent. No pain with calf compression b/l. Lower extremity skin temperature gradient within normal limits. Nonpitting edema noted BLE. Evidence of chronic venous insufficiency b/l LE. No cyanosis or clubbing noted b/l LE. ? ?Protective sensation intact 5/5 intact bilaterally with 10g monofilament b/l. Vibratory sensation intact b/l. ? ?Dermatological:  ?Pedal skin thin and atrophic b/l LE. No open wounds b/l LE. No interdigital macerations noted b/l LE. Toenails 1-5 b/l elongated, discolored, dystrophic, thickened, crumbly with subungual debris and  tenderness to dorsal palpation. No hyperkeratotic nor porokeratotic lesions present on today's visit. ? ?Musculoskeletal:  ?Normal muscle strength 5/5 to all lower extremity muscle groups bilaterally. Pes planus deformity noted bilateral LE.Marland Kitchen No pain, crepitus or joint limitation noted with ROM b/l LE.  Patient ambulates independently without assistive aids. ? ?Assessment: ?1. Pain due to onychomycosis of toenails of both feet   ?2. Peripheral vascular disease with claudication ([name])   ? ?Plan: ?-Patient was evaluated and treated. All patient's and/or POA's questions/concerns answered on today's visit. ?-Patient to continue soft, supportive shoe gear daily. ?-Toenails 1-5 b/l were debrided in length and girth with sterile nail nippers and dremel without iatrogenic bleeding.  ?-Patient/POA to call should there be question/concern in the interim. ? ?Return in about 3 months (around 10/27/2021). ? ?Marzetta Board, DPM ?

## 2021-08-10 ENCOUNTER — Ambulatory Visit: Payer: Medicare PPO | Admitting: Podiatry

## 2021-10-27 ENCOUNTER — Other Ambulatory Visit: Payer: Self-pay | Admitting: Family Medicine

## 2021-10-27 DIAGNOSIS — E785 Hyperlipidemia, unspecified: Secondary | ICD-10-CM

## 2021-10-28 ENCOUNTER — Ambulatory Visit: Payer: Medicare PPO | Admitting: Podiatry

## 2021-11-04 ENCOUNTER — Ambulatory Visit: Payer: Medicare PPO | Admitting: Family

## 2021-11-04 DIAGNOSIS — L03115 Cellulitis of right lower limb: Secondary | ICD-10-CM | POA: Diagnosis not present

## 2021-11-06 ENCOUNTER — Encounter: Payer: Self-pay | Admitting: Family

## 2021-11-06 MED ORDER — SULFAMETHOXAZOLE-TRIMETHOPRIM 800-160 MG PO TABS: 1.0000 | ORAL_TABLET | Freq: Two times a day (BID) | ORAL | 0 refills | Status: DC

## 2021-11-06 NOTE — Progress Notes (Signed)
Office Visit Note   Patient: Gabriel Love           Date of Birth: 1940-07-05           MRN: [id number] Visit Date: 11/04/2021              Requested by: Laurey Morale, MD [place],  [place] [address] PCP: Laurey Morale, MD  Chief Complaint  Patient presents with   Right Leg - Wound Check      HPI: The patient is an 81 year old gentleman who presents today for concern of worsening swelling with a blister to his right lower extremity.  This has been a chronic problem the edema has been gradually worsening for the last 8 months.  He does follow with Dr. Sarajane Jews his family medicine doctor.  He last had cellulitis in January was given a course of Keflex.  He uses mupirocin to the wounds to his right lower extremity.  Assessment & Plan: Visit Diagnoses: No diagnosis found.  Plan: We will place him in a compression wrap today for his uncontrolled edema to the right lower extremity once this is under control hope to get him back into some compression garments.  He is currently unable to don these to his right lower extremity has significant difficulty getting them onto his left lower extremity at this time place him on a course of Bactrim as well  Follow-Up Instructions: No follow-ups on file.   Ortho Exam  Patient is alert, oriented, no adenopathy, well-dressed, normal affect, normal respiratory effort. On examination of the right lower extremity there is 2+ pitting edema with dry flaking skin there is erythema and warmth there is no ascending cellulitis does have cellulitis to his right lower extremity there is a serous fluid-filled blister to his medial foot  Imaging: No results found. No images are attached to the encounter.  Labs: Lab Results  Component Value Date   HGBA1C 5.3 01/21/2020   HGBA1C 5.5 02/19/2019   HGBA1C 5.6 02/28/2015   ESRSEDRATE 14 06/06/2013   REPTSTATUS 05/13/2020 FINAL 05/08/2020   CULT  05/08/2020    NO GROWTH 5 DAYS Performed  at [facility], [address] [place], [place] [address]    LABORGA PSEUDOMONAS AERUGINOSA (A) 05/08/2020     Lab Results  Component Value Date   ALBUMIN 2.8 (L) 05/09/2020   ALBUMIN 3.3 (L) 05/08/2020   ALBUMIN 3.7 02/19/2019    Lab Results  Component Value Date   MG 1.7 05/08/2020   Lab Results  Component Value Date   VD25OH 44.91 05/09/2020    No results found for: "PREALBUMIN"    Latest Ref Rng & Units 05/10/2020    5:52 AM 05/09/2020    5:22 AM 05/08/2020    5:40 PM  CBC EXTENDED  WBC 4.0 - 10.5 K/uL 7.4  7.4  8.4   RBC 4.22 - 5.81 MIL/uL 4.29  5.08  4.96   Hemoglobin 13.0 - 17.0 g/dL 12.6  14.7  14.5   HCT 39.0 - 52.0 % 39.8  47.1  45.7   Platelets 150 - 400 K/uL 156  126  158   NEUT# 1.7 - 7.7 K/uL 5.1  5.3  6.9   Lymph# 0.7 - 4.0 K/uL 1.4  1.4  1.0      There is no height or weight on file to calculate BMI.  Orders:  No orders of the defined types were placed in this encounter.  Meds ordered this encounter  Medications   sulfamethoxazole-trimethoprim (BACTRIM DS) 800-160 MG tablet    Sig: Take 1 tablet by mouth 2 (two) times daily.    Dispense:  20 tablet    Refill:  0     Procedures: No procedures performed  Clinical Data: No additional findings.  ROS:  All other systems negative, except as noted in the HPI. Review of Systems  Objective: Vital Signs: There were no vitals taken for this visit.  Specialty Comments:  No specialty comments available.  PMFS History: Patient Active Problem List   Diagnosis Date Noted   Acute gastroenteritis 05/09/2020   Sepsis secondary to UTI ([place]) 05/08/2020   Grade I diastolic dysfunction 42/68/3419   Class 1 obesity 05/08/2020   Hyponatremia 05/08/2020   Hypokalemia 05/08/2020   Hypoalbuminemia 05/08/2020   Mild protein malnutrition ([place]) 05/08/2020   Depression with anxiety 07/24/2019   Morbid obesity ([place]) 04/04/2018   Bilateral leg edema 02/07/2017   Osteoarthritis 01/28/2017   Urge and  stress incontinence 01/28/2017   History of colonic polyps    Colon polyp 08/07/2013   Benign neoplasm of ileocecal valve, 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   Nonspecific abnormal finding in stool contents 05/29/2013   Atherosclerotic peripheral vascular disease with intermittent claudication ([place]) 06/14/2012   Spinal stenosis of lumbar region 06/14/2012   DEGENERATIVE [place] DISEASE, 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   Hyperlipidemia 11/23/2006   Essential hypertension 11/23/2006   HEMATURIA 11/23/2006   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   Past Medical History:  Diagnosis Date   Abdominal hernia    Allergy    Blood transfusion without reported diagnosis 12/2006   At [facility] with hip replacement - 2 units transfused   Cancer (HCC)    skin cancers   Cataract    Depression    DJD (degenerative joint disease)    shoulders bilateral   Environmental allergies    Full dentures    Grade I diastolic dysfunction 09/10/2977   Hearing loss    bilateral hearing aids   History of colonic polyps    History of urinary tract infection    Hyperlipidemia    under control   Hypertension    "borderline"   Nonspecific abnormal finding in stool contents    Other general symptoms(780.99)    Prostate hypertrophy    sees Dr. Gaynelle Arabian    Urinary leakage     Family History  Problem Relation Age of Onset   Hypertension Mother    Diabetes Mother    Heart failure Mother        cad/mi with rupture ventricle   Hyperlipidemia Father    Colon polyps Father    Cancer Neg Hx        colon or prostate   Colon cancer Neg Hx    Esophageal cancer Neg Hx    Rectal cancer Neg Hx    Stomach cancer Neg Hx     Past Surgical History:  Procedure Laterality Date   CATARACT EXTRACTION W/PHACO Right 03/25/2014   Procedure: CATARACT EXTRACTION PHACO AND INTRAOCULAR LENS PLACEMENT; CDE:9.76;  Surgeon: Williams Che, MD;  Location: [facility];  Service: Ophthalmology;  Laterality: Right;   CATARACT EXTRACTION W/PHACO Left  06/24/2014   Procedure: CATARACT EXTRACTION PHACO AND INTRAOCULAR LENS PLACEMENT (IOC);  Surgeon: Williams Che, MD;  Location: [facility];  Service: Ophthalmology;  Laterality: Left;  CDE:8.45   CIRCUMCISION N/A 03/26/2016   Procedure: CIRCUMCISION ADULT;  Surgeon: Carolan Clines, MD;  Location: [facility];  Service: Urology;  Laterality: N/A;  CIRCUMCISION, NON-NEWBORN  1985   COLONOSCOPY  06-19-13, 03/05/15   per Dr. Deatra Ina, mass at the ileocecal valve plus several polyps, pathology benign, repeat in one year, polyp 2016    COLONOSCOPY WITH PROPOFOL N/A 07/29/2015   Procedure: COLONOSCOPY WITH PROPOFOL;  Surgeon: Manus Gunning, MD;  Location: [facility];  Service: Gastroenterology;  Laterality: N/A;   JOINT REPLACEMENT  12/2006   total left hip per Dr. Percell Miller    LAPAROSCOPIC PARTIAL COLECTOMY N/A 08/07/2013   Procedure: LAPAROSCOPIC right PARTIAL COLECTOMY;  Surgeon: Leighton Ruff, MD;  Location: [facility];  Service: General;  Laterality: N/A;   MULTIPLE TOOTH EXTRACTIONS     PROSTATE SURGERY  03-29-11   had CTT per Dr. Gaynelle Arabian, in office   Social History   Occupational History   Occupation: Retired  Tobacco Use   Smoking status: Former    Packs/day: 1.00    Years: 5.00    Total pack years: 5.00    Types: Cigarettes    Quit date: 03/22/1968    Years since quitting: 53.6   Smokeless tobacco: Former    Types: Chew    Quit date: 09/20/2015   Tobacco comments:    occassionally  Substance and Sexual Activity   Alcohol use: No    Alcohol/week: 0.0 standard drinks of alcohol   Drug use: No   Sexual activity: Not on file

## 2021-11-11 ENCOUNTER — Ambulatory Visit: Payer: Medicare PPO | Admitting: Family

## 2021-11-11 ENCOUNTER — Encounter: Payer: Self-pay | Admitting: Family

## 2021-11-11 DIAGNOSIS — L03115 Cellulitis of right lower limb: Secondary | ICD-10-CM

## 2021-11-11 DIAGNOSIS — R600 Localized edema: Secondary | ICD-10-CM

## 2021-11-11 NOTE — Progress Notes (Signed)
Office Visit Note   Patient: Gabriel Love           Date of Birth: [birth year]           MRN: [id number] Visit Date: 11/11/2021              Requested by: Laurey Morale, MD [place],  [place] [id number] PCP: Laurey Morale, MD  Chief Complaint  Patient presents with   Right Leg - Wound Check      HPI: The patient is an 81 year old gentleman who presents in follow-up for cellulitis and significant venous insufficiency edema to the right lower extremity.  Today he is concerned that his edema has spread into his thighs  He has chronic edema bilateral lower extremities.  He is wearing compression garments on his left lower extremity his wife is concerned as she has not been able to get compression garments onto his right lower extremity in the past  Assessment & Plan: Visit Diagnoses: No diagnosis found.  Plan: Placed in a 20 to 30 mm Hg graduated compression garment to the right lower extremity today double extra-large he was measured his calf is 53 cm he is a size 13 shoe he will continue and double extra-large compression stockings to bilateral lower extremities complete his course of antibiotics  Follow-Up Instructions: No follow-ups on file.   Ortho Exam  Patient is alert, oriented, no adenopathy, well-dressed, normal affect, normal respiratory effort. On examination of the right lower extremity reduced edema there is wrinkling of the skin from compression and dry flaking skin there is no erythema or warmth today he does have pitting edema in his lower thigh without sign of cellulitis  Imaging: No results found. No images are attached to the encounter.  Labs: Lab Results  Component Value Date   HGBA1C 5.3 01/21/2020   HGBA1C 5.5 02/19/2019   HGBA1C 5.6 02/28/2015   ESRSEDRATE 14 06/06/2013   REPTSTATUS 05/13/2020 FINAL 05/08/2020   CULT  05/08/2020    NO GROWTH 5 DAYS Performed at [facility], [address], [place], [place] [address]     LABORGA PSEUDOMONAS AERUGINOSA (A) 05/08/2020     Lab Results  Component Value Date   ALBUMIN 2.8 (L) 05/09/2020   ALBUMIN 3.3 (L) 05/08/2020   ALBUMIN 3.7 02/19/2019    Lab Results  Component Value Date   MG 1.7 05/08/2020   Lab Results  Component Value Date   VD25OH 44.91 05/09/2020    No results found for: "PREALBUMIN"    Latest Ref Rng & Units 05/10/2020    5:52 AM 05/09/2020    5:22 AM 05/08/2020    5:40 PM  CBC EXTENDED  WBC 4.0 - 10.5 K/uL 7.4  7.4  8.4   RBC 4.22 - 5.81 MIL/uL 4.29  5.08  4.96   Hemoglobin 13.0 - 17.0 g/dL 12.6  14.7  14.5   HCT 39.0 - 52.0 % 39.8  47.1  45.7   Platelets 150 - 400 K/uL 156  126  158   NEUT# 1.7 - 7.7 K/uL 5.1  5.3  6.9   Lymph# 0.7 - 4.0 K/uL 1.4  1.4  1.0      There is no height or weight on file to calculate BMI.  Orders:  No orders of the defined types were placed in this encounter.  No orders of the defined types were placed in this encounter.    Procedures: No procedures performed  Clinical Data: No additional findings.  ROS:  All other systems negative, except as noted in the HPI. Review of Systems  Objective: Vital Signs: There were no vitals taken for this visit.  Specialty Comments:  No specialty comments available.  PMFS History: Patient Active Problem List   Diagnosis Date Noted   Acute gastroenteritis 05/09/2020   Sepsis secondary to UTI ([place]) 05/08/2020   Grade I diastolic dysfunction 32/35/5732   Class 1 obesity 05/08/2020   Hyponatremia 05/08/2020   Hypokalemia 05/08/2020   Hypoalbuminemia 05/08/2020   Mild protein malnutrition ([place]) 05/08/2020   Depression with anxiety 07/24/2019   Morbid obesity ([place]) 04/04/2018   Bilateral leg edema 02/07/2017   Osteoarthritis 01/28/2017   Urge and stress incontinence 01/28/2017   History of colonic polyps    Colon polyp 08/07/2013   Benign neoplasm of ileocecal valve, 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   Nonspecific abnormal finding in stool contents  05/29/2013   Atherosclerotic peripheral vascular disease with intermittent claudication ([place]) 06/14/2012   Spinal stenosis of lumbar region 06/14/2012   DEGENERATIVE [place] DISEASE, 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   Hyperlipidemia 11/23/2006   Essential hypertension 11/23/2006   HEMATURIA 11/23/2006   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   Past Medical History:  Diagnosis Date   Abdominal hernia    Allergy    Blood transfusion without reported diagnosis 12/2006   At [facility] with hip replacement - 2 units transfused   Cancer (HCC)    skin cancers   Cataract    Depression    DJD (degenerative joint disease)    shoulders bilateral   Environmental allergies    Full dentures    Grade I diastolic dysfunction 04/23/5425   Hearing loss    bilateral hearing aids   History of colonic polyps    History of urinary tract infection    Hyperlipidemia    under control   Hypertension    "borderline"   Nonspecific abnormal finding in stool contents    Other general symptoms(780.99)    Prostate hypertrophy    sees Dr. Gaynelle Arabian    Urinary leakage     Family History  Problem Relation Age of Onset   Hypertension Mother    Diabetes Mother    Heart failure Mother        cad/mi with rupture ventricle   Hyperlipidemia Father    Colon polyps Father    Cancer Neg Hx        colon or prostate   Colon cancer Neg Hx    Esophageal cancer Neg Hx    Rectal cancer Neg Hx    Stomach cancer Neg Hx     Past Surgical History:  Procedure Laterality Date   CATARACT EXTRACTION W/PHACO Right 03/25/2014   Procedure: CATARACT EXTRACTION PHACO AND INTRAOCULAR LENS PLACEMENT; CDE:9.76;  Surgeon: Williams Che, MD;  Location: [facility];  Service: Ophthalmology;  Laterality: Right;   CATARACT EXTRACTION W/PHACO Left 06/24/2014   Procedure: CATARACT EXTRACTION PHACO AND INTRAOCULAR LENS PLACEMENT (IOC);  Surgeon: Williams Che, MD;  Location: [facility];  Service: Ophthalmology;  Laterality: Left;  CDE:8.45   CIRCUMCISION  N/A 03/26/2016   Procedure: CIRCUMCISION ADULT;  Surgeon: Carolan Clines, MD;  Location: [facility];  Service: Urology;  Laterality: N/[place], [place]   COLONOSCOPY  06-19-13, 03/05/15   per Dr. Deatra Ina, mass at the ileocecal valve plus several polyps, pathology benign, repeat in one year, polyp 2016    COLONOSCOPY WITH PROPOFOL N/A 07/29/2015   Procedure: COLONOSCOPY WITH PROPOFOL;  Surgeon: Renelda Loma  Havery Moros, MD;  Location: [facility];  Service: Gastroenterology;  Laterality: N/A;   JOINT REPLACEMENT  12/2006   total left hip per Dr. Percell Miller    LAPAROSCOPIC PARTIAL COLECTOMY N/A 08/07/2013   Procedure: LAPAROSCOPIC right PARTIAL COLECTOMY;  Surgeon: Leighton Ruff, MD;  Location: [facility];  Service: General;  Laterality: N/A;   MULTIPLE TOOTH EXTRACTIONS     PROSTATE SURGERY  03-29-11   had CTT per Dr. Gaynelle Arabian, in office   Social History   Occupational History   Occupation: Retired  Tobacco Use   Smoking status: Former    Packs/day: 1.00    Years: 5.00    Total pack years: 5.00    Types: Cigarettes    Quit date: 03/22/1968    Years since quitting: 53.6   Smokeless tobacco: Former    Types: Chew    Quit date: 09/20/2015   Tobacco comments:    occassionally  Substance and Sexual Activity   Alcohol use: No    Alcohol/week: 0.0 standard drinks of alcohol   Drug use: No   Sexual activity: Not on file

## 2021-11-25 ENCOUNTER — Encounter: Payer: Self-pay | Admitting: Family

## 2021-11-25 ENCOUNTER — Ambulatory Visit: Payer: Medicare PPO | Admitting: Family

## 2021-11-25 DIAGNOSIS — R600 Localized edema: Secondary | ICD-10-CM

## 2021-11-25 DIAGNOSIS — L03115 Cellulitis of right lower limb: Secondary | ICD-10-CM | POA: Diagnosis not present

## 2021-11-25 NOTE — Progress Notes (Signed)
Office Visit Note   Patient: Gabriel Love           Date of Birth: 26-Dec-1940           MRN: [id number] Visit Date: 11/25/2021              Requested by: Laurey Morale, MD [place],  [place] [address] PCP: Laurey Morale, MD  Chief Complaint  Patient presents with   Right Leg - Follow-up      HPI: The patient is an 81 year old gentleman who presents in follow-up for lower extremity edema.  Was placed in a medical compression stocking to the right lower extremity at last visit.  His wife states that she felt this stocking was easier to don and doff.  They are concerned about a new blister in the first webspace on the right foot  He has chronic edema bilateral lower extremities.  He is wearing compression garments on his left lower extremity his wife is concerned as she has not been able to get compression garments onto his right lower extremity in the past  Assessment & Plan: Visit Diagnoses: No diagnosis found.  Plan: Placed in a 20 to 30 mm Hg graduated compression garment to the right lower extremity today double extra-large he was measured his calf is 53 cm he is a size 13 shoe he will continue and double extra-large compression stockings to bilateral lower extremities  Given an order for new stockings they will go to [facility] to purchase 3 more pairs.  They would like to hold off on lymphedema pumps at this time.  We will follow-up low up in the office on an as-needed basis.  Follow-Up Instructions: No follow-ups on file.   Ortho Exam  Patient is alert, oriented, no adenopathy, well-dressed, normal affect, normal respiratory effort. On examination of the right lower extremity stable edema. dry flaking skin.  Fluid-filled blister in the first webspace this was lanced there is serous drainage there is no foul odor no surrounding erythema no sign of infection there is no erythema or warmth today he does have pitting edema in his lower thigh  without sign of cellulitis  Gauze placed in the webspace  Imaging: No results found. No images are attached to the encounter.  Labs: Lab Results  Component Value Date   HGBA1C 5.3 01/21/2020   HGBA1C 5.5 02/19/2019   HGBA1C 5.6 02/28/2015   ESRSEDRATE 14 06/06/2013   REPTSTATUS 05/13/2020 FINAL 05/08/2020   CULT  05/08/2020    NO GROWTH 5 DAYS Performed at [facility], [address] [place], [place] [address]    LABORGA PSEUDOMONAS AERUGINOSA (A) 05/08/2020     Lab Results  Component Value Date   ALBUMIN 2.8 (L) 05/09/2020   ALBUMIN 3.3 (L) 05/08/2020   ALBUMIN 3.7 02/19/2019    Lab Results  Component Value Date   MG 1.7 05/08/2020   Lab Results  Component Value Date   VD25OH 44.91 05/09/2020    No results found for: "PREALBUMIN"    Latest Ref Rng & Units 05/10/2020    5:52 AM 05/09/2020    5:22 AM 05/08/2020    5:40 PM  CBC EXTENDED  WBC 4.0 - 10.5 K/uL 7.4  7.4  8.4   RBC 4.22 - 5.81 MIL/uL 4.29  5.08  4.96   Hemoglobin 13.0 - 17.0 g/dL 12.6  14.7  14.5   HCT 39.0 - 52.0 % 39.8  47.1  45.7   Platelets  150 - 400 K/uL 156  126  158   NEUT# 1.7 - 7.7 K/uL 5.1  5.3  6.9   Lymph# 0.7 - 4.0 K/uL 1.4  1.4  1.0      There is no height or weight on file to calculate BMI.  Orders:  No orders of the defined types were placed in this encounter.  No orders of the defined types were placed in this encounter.    Procedures: No procedures performed  Clinical Data: No additional findings.  ROS:  All other systems negative, except as noted in the HPI. Review of Systems  Objective: Vital Signs: There were no vitals taken for this visit.  Specialty Comments:  No specialty comments available.  PMFS History: Patient Active Problem List   Diagnosis Date Noted   Acute gastroenteritis 05/09/2020   Sepsis secondary to UTI ([place]) 05/08/2020   Grade I diastolic dysfunction 03/50/0938   Class 1 obesity 05/08/2020   Hyponatremia 05/08/2020   Hypokalemia  05/08/2020   Hypoalbuminemia 05/08/2020   Mild protein malnutrition ([place]) 05/08/2020   Depression with anxiety 07/24/2019   Morbid obesity ([place]) 04/04/2018   Bilateral leg edema 02/07/2017   Osteoarthritis 01/28/2017   Urge and stress incontinence 01/28/2017   History of colonic polyps    Colon polyp 08/07/2013   Benign neoplasm of ileocecal valve, 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   Nonspecific abnormal finding in stool contents 05/29/2013   Atherosclerotic peripheral vascular disease with intermittent claudication ([place]) 06/14/2012   Spinal stenosis of lumbar region 06/14/2012   DEGENERATIVE [place] DISEASE, 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   Hyperlipidemia 11/23/2006   Essential hypertension 11/23/2006   HEMATURIA 11/23/2006   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   Past Medical History:  Diagnosis Date   Abdominal hernia    Allergy    Blood transfusion without reported diagnosis 12/2006   At [facility] with hip replacement - 2 units transfused   Cancer (HCC)    skin cancers   Cataract    Depression    DJD (degenerative joint disease)    shoulders bilateral   Environmental allergies    Full dentures    Grade I diastolic dysfunction 1/82/9937   Hearing loss    bilateral hearing aids   History of colonic polyps    History of urinary tract infection    Hyperlipidemia    under control   Hypertension    "borderline"   Nonspecific abnormal finding in stool contents    Other general symptoms(780.99)    Prostate hypertrophy    sees Dr. Gaynelle Arabian    Urinary leakage     Family History  Problem Relation Age of Onset   Hypertension Mother    Diabetes Mother    Heart failure Mother        cad/mi with rupture ventricle   Hyperlipidemia Father    Colon polyps Father    Cancer Neg Hx        colon or prostate   Colon cancer Neg Hx    Esophageal cancer Neg Hx    Rectal cancer Neg Hx    Stomach cancer Neg Hx     Past Surgical History:  Procedure Laterality Date   CATARACT EXTRACTION  W/PHACO Right 03/25/2014   Procedure: CATARACT EXTRACTION PHACO AND INTRAOCULAR LENS PLACEMENT; CDE:9.76;  Surgeon: Williams Che, MD;  Location: [facility];  Service: Ophthalmology;  Laterality: Right;   CATARACT EXTRACTION W/PHACO Left 06/24/2014   Procedure: CATARACT EXTRACTION PHACO AND INTRAOCULAR LENS PLACEMENT (IOC);  Surgeon: Williams Che,  MD;  Location: [facility];  Service: Ophthalmology;  Laterality: Left;  CDE:8.45   CIRCUMCISION N/A 03/26/2016   Procedure: CIRCUMCISION ADULT;  Surgeon: Carolan Clines, MD;  Location: [facility];  Service: Urology;  Laterality: N/ADenton Meek, [place]   COLONOSCOPY  06-19-13, 03/05/15   per Dr. 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 Percell Miller    LAPAROSCOPIC PARTIAL COLECTOMY N/A 08/07/2013   Procedure: LAPAROSCOPIC right PARTIAL COLECTOMY;  Surgeon: Leighton Ruff, MD;  Location: [facility];  Service: General;  Laterality: N/A;   MULTIPLE TOOTH EXTRACTIONS     PROSTATE SURGERY  03-29-11   had CTT per Dr. Gaynelle Arabian, in office   Social History   Occupational History   Occupation: Retired  Tobacco Use   Smoking status: Former    Packs/day: 1.00    Years: 5.00    Total pack years: 5.00    Types: Cigarettes    Quit date: 03/22/1968    Years since quitting: 53.7   Smokeless tobacco: Former    Types: Chew    Quit date: 09/20/2015   Tobacco comments:    occassionally  Substance and Sexual Activity   Alcohol use: No    Alcohol/week: 0.0 standard drinks of alcohol   Drug use: No   Sexual activity: Not on file

## 2021-11-27 ENCOUNTER — Other Ambulatory Visit: Payer: Self-pay | Admitting: Family Medicine

## 2021-11-27 DIAGNOSIS — E785 Hyperlipidemia, unspecified: Secondary | ICD-10-CM

## 2021-12-09 ENCOUNTER — Other Ambulatory Visit: Payer: Self-pay | Admitting: Family Medicine

## 2021-12-15 ENCOUNTER — Ambulatory Visit: Payer: Medicare PPO | Admitting: Podiatry

## 2021-12-28 ENCOUNTER — Ambulatory Visit
Admission: EM | Admit: 2021-12-28 | Discharge: 2021-12-28 | Disposition: A | Discharge status: Home / Self Care | Payer: Medicare PPO | Attending: Family Medicine | Admitting: Family Medicine

## 2021-12-28 ENCOUNTER — Ambulatory Visit (INDEPENDENT_AMBULATORY_CARE_PROVIDER_SITE_OTHER): Payer: Medicare PPO

## 2021-12-28 ENCOUNTER — Ambulatory Visit: Payer: Medicare PPO

## 2021-12-28 DIAGNOSIS — R062 Wheezing: Secondary | ICD-10-CM | POA: Diagnosis not present

## 2021-12-28 DIAGNOSIS — R0682 Tachypnea, not elsewhere classified: Secondary | ICD-10-CM

## 2021-12-28 DIAGNOSIS — J069 Acute upper respiratory infection, unspecified: Secondary | ICD-10-CM | POA: Diagnosis not present

## 2021-12-28 DIAGNOSIS — Z87891 Personal history of nicotine dependence: Secondary | ICD-10-CM | POA: Diagnosis not present

## 2021-12-28 DIAGNOSIS — R059 Cough, unspecified: Secondary | ICD-10-CM

## 2021-12-28 DIAGNOSIS — Z1152 Encounter for screening for COVID-19: Secondary | ICD-10-CM | POA: Insufficient documentation

## 2021-12-28 DIAGNOSIS — R0602 Shortness of breath: Secondary | ICD-10-CM

## 2021-12-28 LAB — RESP PANEL BY RT-PCR (FLU A&B, COVID) ARPGX2
Influenza A by PCR: NEGATIVE
Influenza B by PCR: NEGATIVE
SARS Coronavirus 2 by RT PCR: NEGATIVE

## 2021-12-28 MED ORDER — PREDNISONE 20 MG PO TABS: 40.0000 mg | ORAL_TABLET | Freq: Every day | ORAL | 0 refills | Status: DC

## 2021-12-28 MED ORDER — ALBUTEROL SULFATE HFA 108 (90 BASE) MCG/ACT IN AERS: 2.0000 | INHALATION_SPRAY | RESPIRATORY_TRACT | 2 refills | Status: DC | PRN

## 2021-12-28 MED ORDER — METHYLPREDNISOLONE SODIUM SUCC 125 MG IJ SOLR
80.0000 mg | Freq: Once | INTRAMUSCULAR | Status: AC
  Administered 2021-12-28: 80 mg via INTRAMUSCULAR

## 2021-12-28 MED ORDER — IPRATROPIUM-ALBUTEROL 0.5-2.5 (3) MG/3ML IN SOLN
3.0000 mL | Freq: Once | RESPIRATORY_TRACT | Status: AC
  Administered 2021-12-28: 3 mL via RESPIRATORY_TRACT

## 2021-12-28 NOTE — ED Provider Notes (Addendum)
[facility]    CSN: [id number] Arrival date & time: 12/28/21  7412      History   Chief Complaint Chief Complaint  Patient presents with   Cough   Wheezing    HPI Gabriel Love is a 81 y.o. male.   Patient presenting today with 2-day history of cough, congestion, wheezing, shortness of breath, chest tightness.  Denies fever, chills, chest pain, abdominal pain, nausea vomiting or diarrhea.  So far took a dose of NyQuil last night with no relief.  Otherwise not taking anything over-the-counter for symptoms.  Does have a history of significant seasonal allergies not currently on anything for this.  Denies any known history of chronic pulmonary disease.  Wife was sick with similar symptoms earlier in the week but has improved.   Past Medical History:  Diagnosis Date   Abdominal hernia    Allergy    Blood transfusion without reported diagnosis 12/2006   At [facility] with hip replacement - 2 units transfused   Cancer ([place])    skin cancers   Cataract    Depression    DJD (degenerative joint disease)    shoulders bilateral   Environmental allergies    Full dentures    Grade I diastolic dysfunction 8/78/6767   Hearing loss    bilateral hearing aids   History of colonic polyps    History of urinary tract infection    Hyperlipidemia    under control   Hypertension    "borderline"   Nonspecific abnormal finding in stool contents    Other general symptoms(780.99)    Prostate hypertrophy    sees Dr. Gaynelle Arabian    Urinary leakage    Patient Active Problem List   Diagnosis Date Noted   Acute gastroenteritis 05/09/2020   Sepsis secondary to UTI ([place]) 05/08/2020   Grade I diastolic dysfunction 20/94/7096   Class 1 obesity 05/08/2020   Hyponatremia 05/08/2020   Hypokalemia 05/08/2020   Hypoalbuminemia 05/08/2020   Mild protein malnutrition ([place]) 05/08/2020   Depression with anxiety 07/24/2019   Morbid obesity ([place]) 04/04/2018   Bilateral leg edema 02/07/2017    Osteoarthritis 01/28/2017   Urge and stress incontinence 01/28/2017   History of colonic polyps    Colon polyp 08/07/2013   Benign neoplasm of ileocecal valve, 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   Nonspecific abnormal finding in stool contents 05/29/2013   Atherosclerotic peripheral vascular disease with intermittent claudication ([place]) 06/14/2012   Spinal stenosis of lumbar region 06/14/2012   DEGENERATIVE [place] DISEASE, 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   Hyperlipidemia 11/23/2006   Essential hypertension 11/23/2006   HEMATURIA 11/23/2006   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   Past Surgical History:  Procedure Laterality Date   CATARACT EXTRACTION W/PHACO Right 03/25/2014   Procedure: CATARACT EXTRACTION PHACO AND INTRAOCULAR LENS PLACEMENT; CDE:9.76;  Surgeon: Williams Che, MD;  Location: [facility];  Service: Ophthalmology;  Laterality: Right;   CATARACT EXTRACTION W/PHACO Left 06/24/2014   Procedure: CATARACT EXTRACTION PHACO AND INTRAOCULAR LENS PLACEMENT (IOC);  Surgeon: Williams Che, MD;  Location: [facility];  Service: Ophthalmology;  Laterality: Left;  CDE:8.45   CIRCUMCISION N/A 03/26/2016   Procedure: CIRCUMCISION ADULT;  Surgeon: Carolan Clines, MD;  Location[facility];  Service: Urology;  Laterality: N/ADenton Meek, [place]   COLONOSCOPY  06-19-13, 03/05/15   per Dr. Deatra Ina, mass at the ileocecal valve plus several polyps, pathology benign, repeat in one year, polyp 2016    COLONOSCOPY WITH PROPOFOL N/A 07/29/2015  Procedure: COLONOSCOPY WITH PROPOFOL;  Surgeon: Manus Gunning, MD;  Location: [facility];  Service: Gastroenterology;  Laterality: N/A;   JOINT REPLACEMENT  12/2006   total left hip per Dr. Percell Miller    LAPAROSCOPIC PARTIAL COLECTOMY N/A 08/07/2013   Procedure: LAPAROSCOPIC right PARTIAL COLECTOMY;  Surgeon: Leighton Ruff, MD;  Location[facility];  Service: General;  Laterality: N/A;   MULTIPLE TOOTH EXTRACTIONS     PROSTATE SURGERY  03-29-11   had CTT per Dr.  Gaynelle Arabian, in office     Home Medications    Prior to Admission medications   Medication Sig Start Date End Date Taking? Authorizing Provider  predniSONE (DELTASONE) 20 MG tablet Take 2 tablets (40 mg total) by mouth daily with breakfast. 12/28/21  Yes Volney American, PA-C  albuterol (VENTOLIN HFA) 108 (90 Base) MCG/ACT inhaler Inhale 2 puffs into the lungs every 4 (four) hours as needed for wheezing or shortness of breath. 12/28/21   Volney American, PA-C  Ascorbic Acid (VITAMIN C PO) Take 1 tablet by mouth daily.    [provider]  aspirin EC 81 MG tablet Take 81 mg by mouth daily.    [provider]  atorvastatin (LIPITOR) 40 MG tablet TAKE 1 TABLET BY MOUTH DAILY 11/27/21   Laurey Morale, MD  cephALEXin (KEFLEX) 500 MG capsule Take 1 capsule (500 mg total) by mouth 4 (four) times daily. 03/31/21   Laurey Morale, MD  clotrimazole-betamethasone (LOTRISONE) cream Apply 1 application topically daily. Applies to affected area. 03/31/21   Laurey Morale, MD  furosemide (LASIX) 40 MG tablet TAKE 1 TABLET(40 MG) BY MOUTH TWICE DAILY 12/09/21   Laurey Morale, MD  LEXAPRO 20 MG tablet TAKE 1 TABLET(20 MG) BY MOUTH DAILY 12/15/20   Laurey Morale, MD  metoprolol succinate (TOPROL-XL) 50 MG 24 hr tablet TAKE 1 TABLET BY MOUTH EVERY DAY 02/26/21   Laurey Morale, MD  Multiple Vitamins-Minerals (CENTRUM ADULTS PO) Take 1 tablet by mouth daily.    [provider]  mupirocin cream (BACTROBAN) 2 % Apply 1 application topically 2 (two) times daily. 03/31/21   Laurey Morale, MD  mupirocin ointment (BACTROBAN) 2 % Apply topically 2 (two) times daily. 12/20/18   Laurey Morale, MD  potassium chloride SA (KLOR-CON M) 20 MEQ tablet TAKE 1 TABLET(20 MEQ) BY MOUTH DAILY 11/27/21   Laurey Morale, MD  predniSONE (DELTASONE) 10 MG tablet Take by mouth. 02/21/21   [provider]  sulfamethoxazole-trimethoprim (BACTRIM DS) 800-160 MG tablet Take 1 tablet by mouth 2 (two)  times daily. 11/06/21   Suzan Slick, NP    Family History Family History  Problem Relation Age of Onset   Hypertension Mother    Diabetes Mother    Heart failure Mother        cad/mi with rupture ventricle   Hyperlipidemia Father    Colon polyps Father    Cancer Neg Hx        colon or prostate   Colon cancer Neg Hx    Esophageal cancer Neg Hx    Rectal cancer Neg Hx    Stomach cancer Neg Hx     Social History Social History   Tobacco Use   Smoking status: Former    Packs/day: 1.00    Years: 5.00    Total pack years: 5.00    Types: Cigarettes    Quit date: 03/22/1968    Years since quitting: 53.8   Smokeless tobacco:  Former    Types: Chew    Quit date: 09/20/2015   Tobacco comments:    occassionally  Substance Use Topics   Alcohol use: No    Alcohol/week: 0.0 standard drinks of alcohol   Drug use: No     Allergies   Ivp dye [iodinated contrast media], Penicillins, and Lisinopril   Review of Systems Review of Systems Per HPI  Physical Exam Triage Vital Signs ED Triage Vitals  Enc Vitals Group     BP 12/28/21 0822 (!) 189/116     Pulse Rate 12/28/21 0822 93     Resp 12/28/21 0822 16     Temp 12/28/21 0822 98.5 F (36.9 C)     Temp src --      SpO2 12/28/21 0822 (!) 88 %     Weight --      Height --      Head Circumference --      Peak Flow --      Pain Score 12/28/21 0825 0     Pain Loc --      Pain Edu? --      Excl. in [place]? --    No data found.  Updated Vital Signs BP (!) 173/92 (BP Location: Right Arm)   Pulse 82   Temp 98.5 F (36.9 C)   Resp (!) 28   SpO2 91%   Visual Acuity Right Eye Distance:   Left Eye Distance:   Bilateral Distance:    Right Eye Near:   Left Eye Near:    Bilateral Near:     Physical Exam Vitals and nursing note reviewed.  Constitutional:      Appearance: He is well-developed.  HENT:     Head: Atraumatic.     Right Ear: External ear normal.     Left Ear: External ear normal.     Nose: Rhinorrhea  present.     Mouth/Throat:     Pharynx: Posterior oropharyngeal erythema present. No oropharyngeal exudate.  Eyes:     Conjunctiva/sclera: Conjunctivae normal.     Pupils: Pupils are equal, round, and reactive to light.  Cardiovascular:     Rate and Rhythm: Normal rate and regular rhythm.  Pulmonary:     Effort: Pulmonary effort is normal. No respiratory distress.     Breath sounds: Wheezing present. No rales.     Comments: Moderate to significant wheezes bilaterally.  Speaking in full sentences, no respiratory distress. Musculoskeletal:        General: Normal range of motion.     Cervical back: Normal range of motion and neck supple.  Lymphadenopathy:     Cervical: No cervical adenopathy.  Skin:    General: Skin is warm and dry.  Neurological:     Mental Status: He is alert and oriented to person, place, and time.  Psychiatric:        Behavior: Behavior normal.      UC Treatments / Results  Labs (all labs ordered are listed, but only abnormal results are displayed) Labs Reviewed  RESP PANEL BY RT-PCR (FLU A&B, COVID) [id number]    EKG   Radiology DG Chest 1 View  Result Date: 12/28/2021 CLINICAL DATA:  Cough with shortness of breath for 2 days. EXAM: CHEST  1 VIEW COMPARISON:  02/21/21 FINDINGS: Limitations: Bilateral costophrenic angles are excluded from the field of view. Within this limitation, no pleural effusion. No pneumothorax. No focal airspace opacity. There is slight prominence of the interstitium. Normal cardiac and mediastinal contours. No  displaced rib fractures. Mild degenerative changes of the left AC joint. The visualized upper abdomen is unremarkable. IMPRESSION: Limitation: Bilateral costophrenic angles are excluded from the field of view. Within this limitation- 1. No focal airspace opacity. 2. Slightly prominent interstitial opacities, nonspecific, but could be seen with bronchitis / small airways disease. Electronically Signed   By: Marin Roberts M.D.   On:  12/28/2021 09:18    Procedures Procedures (including critical care time)  Medications Ordered in UC Medications  ipratropium-albuterol (DUONEB) 0.5-2.5 (3) MG/3ML nebulizer solution 3 mL (3 mLs Nebulization Given 12/28/21 0844)  methylPREDNISolone sodium succinate (SOLU-MEDROL) 125 mg/2 mL injection 80 mg (80 mg Intramuscular Given 12/28/21 0937)    Initial Impression / Assessment and Plan / UC Course  I have reviewed the triage vital signs and the nursing notes.  Pertinent labs & imaging results that were available during my care of the patient were reviewed by me and considered in my medical decision making (see chart for details).     Tachypneic in triage, oxygen saturations ranging between 90 and 91% on room air, he is speaking in full sentences but significant audible wheezing throughout time in clinic.  Mild improvement with DuoNeb but still significant wheezes, tachypnea.  Chest x-ray was performed showing no pneumonia, bronchitis type changes.  Discussed that it would be appropriate to go to the emergency department at this time given his inability to hold his oxygen saturation with range of motion, as it is dipping down to 87% with ambulation and he is still highly symptomatic after nebulizer treatment but he declines going to the emergency department so we will give IM Solu-Medrol and sent home on a prednisone burst and albuterol inhaler which discussed to use every 2-4 hours until feeling much better, then can space out to as needed.  Discussed to go to the emergency department if continuing to worsen or not significantly improving over the next 12 to 24 hours.  50 minutes spent today in direct patient care, evaluation, education  Final Clinical Impressions(s) / UC Diagnoses   Final diagnoses:  Viral URI with cough  SOB (shortness of breath)  Wheezing  Tachypnea     Discharge Instructions      You may take Coricidin HBP, Flonase nasal spray twice daily, antihistamines such  as Zyrtec, plain Mucinex twice daily in addition to what has been prescribed.  Go to the emergency department at any point that you start feeling worse.  Follow-up with your primary care for a breathing recheck within the next few days.     ED Prescriptions     Medication Sig Dispense Auth. Provider   predniSONE (DELTASONE) 20 MG tablet Take 2 tablets (40 mg total) by mouth daily with breakfast. 10 tablet Volney American, PA-C   albuterol (VENTOLIN HFA) 108 (90 Base) MCG/ACT inhaler Inhale 2 puffs into the lungs every 4 (four) hours as needed for wheezing or shortness of breath. 18 g Volney American, [place]      PDMP not reviewed this encounter.   Volney American, PA-C 12/28/21 [place], [place] Annamary Buschman, PA-C 12/28/21 [phone]

## 2021-12-28 NOTE — ED Triage Notes (Signed)
Pt reports short of breath, coughing, wheezing, for 2 days. Pt took nyquil last night with not relief. Pt received flu shot last week

## 2021-12-28 NOTE — Discharge Instructions (Addendum)
You may take Coricidin HBP, Flonase nasal spray twice daily, antihistamines such as Zyrtec, plain Mucinex twice daily in addition to what has been prescribed.  Go to the emergency department at any point that you start feeling worse.  Follow-up with your primary care for a breathing recheck within the next few days.

## 2022-01-01 ENCOUNTER — Other Ambulatory Visit: Payer: Self-pay

## 2022-01-01 ENCOUNTER — Inpatient Hospital Stay (HOSPITAL_COMMUNITY)
Admission: EM | Admit: 2022-01-01 | Discharge: 2022-01-04 | DRG: 194 | Disposition: A | Discharge status: Home / Self Care | Payer: Medicare PPO | Attending: Internal Medicine | Admitting: Internal Medicine

## 2022-01-01 ENCOUNTER — Emergency Department (HOSPITAL_COMMUNITY): Payer: Medicare PPO

## 2022-01-01 ENCOUNTER — Encounter (HOSPITAL_COMMUNITY): Payer: Self-pay

## 2022-01-01 DIAGNOSIS — Z880 Allergy status to penicillin: Secondary | ICD-10-CM | POA: Diagnosis not present

## 2022-01-01 DIAGNOSIS — F418 Other specified anxiety disorders: Secondary | ICD-10-CM | POA: Diagnosis present

## 2022-01-01 DIAGNOSIS — B9789 Other viral agents as the cause of diseases classified elsewhere: Secondary | ICD-10-CM | POA: Diagnosis present

## 2022-01-01 DIAGNOSIS — I10 Essential (primary) hypertension: Secondary | ICD-10-CM | POA: Diagnosis present

## 2022-01-01 DIAGNOSIS — Z83438 Family history of other disorder of lipoprotein metabolism and other lipidemia: Secondary | ICD-10-CM

## 2022-01-01 DIAGNOSIS — R0602 Shortness of breath: Secondary | ICD-10-CM | POA: Diagnosis not present

## 2022-01-01 DIAGNOSIS — F32A Depression, unspecified: Secondary | ICD-10-CM | POA: Diagnosis present

## 2022-01-01 DIAGNOSIS — Z888 Allergy status to other drugs, medicaments and biological substances status: Secondary | ICD-10-CM

## 2022-01-01 DIAGNOSIS — Z91041 Radiographic dye allergy status: Secondary | ICD-10-CM

## 2022-01-01 DIAGNOSIS — Z7982 Long term (current) use of aspirin: Secondary | ICD-10-CM

## 2022-01-01 DIAGNOSIS — I110 Hypertensive heart disease with heart failure: Secondary | ICD-10-CM | POA: Diagnosis present

## 2022-01-01 DIAGNOSIS — Z96642 Presence of left artificial hip joint: Secondary | ICD-10-CM | POA: Diagnosis present

## 2022-01-01 DIAGNOSIS — I5032 Chronic diastolic (congestive) heart failure: Secondary | ICD-10-CM | POA: Diagnosis present

## 2022-01-01 DIAGNOSIS — R062 Wheezing: Secondary | ICD-10-CM | POA: Diagnosis not present

## 2022-01-01 DIAGNOSIS — J189 Pneumonia, unspecified organism: Secondary | ICD-10-CM | POA: Diagnosis not present

## 2022-01-01 DIAGNOSIS — E785 Hyperlipidemia, unspecified: Secondary | ICD-10-CM | POA: Diagnosis present

## 2022-01-01 DIAGNOSIS — R059 Cough, unspecified: Secondary | ICD-10-CM | POA: Diagnosis not present

## 2022-01-01 DIAGNOSIS — R195 Other fecal abnormalities: Secondary | ICD-10-CM | POA: Diagnosis present

## 2022-01-01 DIAGNOSIS — I5189 Other ill-defined heart diseases: Secondary | ICD-10-CM

## 2022-01-01 DIAGNOSIS — F419 Anxiety disorder, unspecified: Secondary | ICD-10-CM | POA: Diagnosis present

## 2022-01-01 DIAGNOSIS — Z79899 Other long term (current) drug therapy: Secondary | ICD-10-CM | POA: Diagnosis not present

## 2022-01-01 DIAGNOSIS — J45901 Unspecified asthma with (acute) exacerbation: Secondary | ICD-10-CM | POA: Diagnosis present

## 2022-01-01 DIAGNOSIS — Z6841 Body Mass Index (BMI) 40.0 and over, adult: Secondary | ICD-10-CM

## 2022-01-01 DIAGNOSIS — Z8249 Family history of ischemic heart disease and other diseases of the circulatory system: Secondary | ICD-10-CM | POA: Diagnosis not present

## 2022-01-01 DIAGNOSIS — Z87891 Personal history of nicotine dependence: Secondary | ICD-10-CM

## 2022-01-01 DIAGNOSIS — Z85828 Personal history of other malignant neoplasm of skin: Secondary | ICD-10-CM

## 2022-01-01 DIAGNOSIS — Z1152 Encounter for screening for COVID-19: Secondary | ICD-10-CM | POA: Diagnosis not present

## 2022-01-01 DIAGNOSIS — E782 Mixed hyperlipidemia: Secondary | ICD-10-CM | POA: Diagnosis present

## 2022-01-01 LAB — COMPREHENSIVE METABOLIC PANEL
ALT: 26 U/L (ref 0–44)
AST: 24 U/L (ref 15–41)
Albumin: 3.4 g/dL — ABNORMAL LOW (ref 3.5–5.0)
Alkaline Phosphatase: 72 U/L (ref 38–126)
Anion gap: 8 (ref 5–15)
BUN: 20 mg/dL (ref 8–23)
CO2: 30 mmol/L (ref 22–32)
Calcium: 8.5 mg/dL — ABNORMAL LOW (ref 8.9–10.3)
Chloride: 104 mmol/L (ref 98–111)
Creatinine, Ser: 0.96 mg/dL (ref 0.61–1.24)
GFR, Estimated: >60 mL/min (ref >60)
Glucose, Bld: 132 mg/dL — ABNORMAL HIGH (ref 70–99)
Potassium: 4.2 mmol/L (ref 3.5–5.1)
Sodium: 142 mmol/L (ref 135–145)
Total Bilirubin: 0.8 mg/dL (ref 0.3–1.2)
Total Protein: 6.5 g/dL (ref 6.5–8.1)

## 2022-01-01 LAB — CBC WITH DIFFERENTIAL/PLATELET
Abs Immature Granulocytes: 0.12 10*3/uL — ABNORMAL HIGH (ref 0.00–0.07)
Basophils Absolute: 0 10*3/uL (ref 0.0–0.1)
Basophils Relative: 0 %
Eosinophils Absolute: 0 10*3/uL (ref 0.0–0.5)
Eosinophils Relative: 0 %
HCT: 48.4 % (ref 39.0–52.0)
Hemoglobin: 15.5 g/dL (ref 13.0–17.0)
Immature Granulocytes: 1 %
Lymphocytes Relative: 11 %
Lymphs Abs: 1.5 10*3/uL (ref 0.7–4.0)
MCH: 29.9 pg (ref 26.0–34.0)
MCHC: 32 g/dL (ref 30.0–36.0)
MCV: 93.4 fL (ref 80.0–100.0)
Monocytes Absolute: 0.9 10*3/uL (ref 0.1–1.0)
Monocytes Relative: 7 %
Neutro Abs: 10.5 10*3/uL — ABNORMAL HIGH (ref 1.7–7.7)
Neutrophils Relative %: 81 %
Platelets: 260 10*3/uL (ref 150–400)
RBC: 5.18 MIL/uL (ref 4.22–5.81)
RDW: 14.4 % (ref 11.5–15.5)
WBC: 13 10*3/uL — ABNORMAL HIGH (ref 4.0–10.5)
nRBC: 0 % (ref 0.0–0.2)

## 2022-01-01 LAB — RESP PANEL BY RT-PCR (FLU A&B, COVID) ARPGX2
Influenza A by PCR: NEGATIVE
Influenza B by PCR: NEGATIVE
SARS Coronavirus 2 by RT PCR: NEGATIVE

## 2022-01-01 LAB — BRAIN NATRIURETIC PEPTIDE: B Natriuretic Peptide: 103 pg/mL — ABNORMAL HIGH (ref 0.0–100.0)

## 2022-01-01 NOTE — ED Triage Notes (Addendum)
Pt presents with a 1 week hx of ShOB that worsened today. Pt was seen at [facility] on 10/9. Pt has audible wheezes and crackles while in triage. Pt denies fevers.   Pt took two puffs of his personal albuterol inhaler during triage at [facility] instruction.

## 2022-01-01 NOTE — ED Provider Triage Note (Signed)
Emergency Medicine Provider Triage Evaluation Note  DEMONT LINFORD , a 81 y.o. male  was evaluated in triage.  Pt complains of cough going on since Monday, states its been productive, no associated fevers or chills, states she feels slightly short of breath but no actual chest pain no pleuritic chest pain, no cardiac history no history PEs or DVTs currently on hormone therapy, he denies any worsening leg swelling.  Patient states that he was seen at urgent care who started him on steroids as well as albuterol inhaler.-.  Review of Systems  Positive: Shortness of breath, cough Negative: Chest pain, abdominal pain  Physical Exam  BP (!) 156/86 (BP Location: Right Arm)   Pulse 72   Temp 98.3 F (36.8 C) (Oral)   Resp (!) 22   SpO2 93%  Gen:   Awake, no distress   Resp:  Normal effort  MSK:   Moves extremities without difficulty  Other:  Not in respiratory distress speaking in full sentences, slightly tachypneic, patient had rhonchi bilaterally which clears with coughing, he has expiratory wheezing present.  Medical Decision Making  Medically screening exam initiated at 12:24 PM.  Appropriate orders placed.  [name] was informed that the remainder of the evaluation will be completed by another provider, this initial triage assessment does not replace that evaluation, and the importance of remaining in the ED until their evaluation is complete.  Lab work imaging been ordered will need further work-up.  Patient has home albuterol he was instructed to take 2 puffs while in triage.   Marcello Fennel, PA-C 01/01/22 1226

## 2022-01-02 ENCOUNTER — Emergency Department (HOSPITAL_COMMUNITY): Payer: Medicare PPO

## 2022-01-02 DIAGNOSIS — Z96642 Presence of left artificial hip joint: Secondary | ICD-10-CM | POA: Diagnosis present

## 2022-01-02 DIAGNOSIS — F419 Anxiety disorder, unspecified: Secondary | ICD-10-CM | POA: Diagnosis present

## 2022-01-02 DIAGNOSIS — Z79899 Other long term (current) drug therapy: Secondary | ICD-10-CM | POA: Diagnosis not present

## 2022-01-02 DIAGNOSIS — Z83438 Family history of other disorder of lipoprotein metabolism and other lipidemia: Secondary | ICD-10-CM | POA: Diagnosis not present

## 2022-01-02 DIAGNOSIS — Z85828 Personal history of other malignant neoplasm of skin: Secondary | ICD-10-CM | POA: Diagnosis not present

## 2022-01-02 DIAGNOSIS — Z880 Allergy status to penicillin: Secondary | ICD-10-CM | POA: Diagnosis not present

## 2022-01-02 DIAGNOSIS — Z888 Allergy status to other drugs, medicaments and biological substances status: Secondary | ICD-10-CM | POA: Diagnosis not present

## 2022-01-02 DIAGNOSIS — Z6841 Body Mass Index (BMI) 40.0 and over, adult: Secondary | ICD-10-CM | POA: Diagnosis not present

## 2022-01-02 DIAGNOSIS — Z8249 Family history of ischemic heart disease and other diseases of the circulatory system: Secondary | ICD-10-CM | POA: Diagnosis not present

## 2022-01-02 DIAGNOSIS — I5032 Chronic diastolic (congestive) heart failure: Secondary | ICD-10-CM | POA: Diagnosis present

## 2022-01-02 DIAGNOSIS — B9789 Other viral agents as the cause of diseases classified elsewhere: Secondary | ICD-10-CM | POA: Diagnosis present

## 2022-01-02 DIAGNOSIS — Z7982 Long term (current) use of aspirin: Secondary | ICD-10-CM | POA: Diagnosis not present

## 2022-01-02 DIAGNOSIS — Z91041 Radiographic dye allergy status: Secondary | ICD-10-CM | POA: Diagnosis not present

## 2022-01-02 DIAGNOSIS — J189 Pneumonia, unspecified organism: Secondary | ICD-10-CM | POA: Diagnosis present

## 2022-01-02 DIAGNOSIS — I110 Hypertensive heart disease with heart failure: Secondary | ICD-10-CM | POA: Diagnosis present

## 2022-01-02 DIAGNOSIS — Z87891 Personal history of nicotine dependence: Secondary | ICD-10-CM | POA: Diagnosis not present

## 2022-01-02 DIAGNOSIS — J45901 Unspecified asthma with (acute) exacerbation: Secondary | ICD-10-CM | POA: Diagnosis present

## 2022-01-02 DIAGNOSIS — E785 Hyperlipidemia, unspecified: Secondary | ICD-10-CM | POA: Diagnosis present

## 2022-01-02 DIAGNOSIS — F32A Depression, unspecified: Secondary | ICD-10-CM | POA: Diagnosis present

## 2022-01-02 DIAGNOSIS — Z1152 Encounter for screening for COVID-19: Secondary | ICD-10-CM | POA: Diagnosis not present

## 2022-01-02 LAB — RESPIRATORY PANEL BY PCR

## 2022-01-02 LAB — TROPONIN I (HIGH SENSITIVITY)
Troponin I (High Sensitivity): 7 ng/L (ref <18)
Troponin I (High Sensitivity): 10 ng/L (ref <18)

## 2022-01-02 LAB — STREP PNEUMONIAE URINARY ANTIGEN: Strep Pneumo Urinary Antigen: NEGATIVE

## 2022-01-02 MED ORDER — METHYLPREDNISOLONE SODIUM SUCC 40 MG IJ SOLR
40.0000 mg | Freq: Once | INTRAMUSCULAR | Status: AC
  Administered 2022-01-02: 40 mg via INTRAVENOUS
  Filled 2022-01-02: qty 1

## 2022-01-02 MED ORDER — ATORVASTATIN CALCIUM 40 MG PO TABS
40.0000 mg | ORAL_TABLET | Freq: Every day | ORAL | Status: DC
  Administered 2022-01-02 – 2022-01-04 (×3): 40 mg via ORAL
  Filled 2022-01-02 (×3): qty 1

## 2022-01-02 MED ORDER — METOPROLOL SUCCINATE ER 50 MG PO TB24
50.0000 mg | ORAL_TABLET | Freq: Every day | ORAL | Status: DC
  Administered 2022-01-02 – 2022-01-04 (×3): 50 mg via ORAL
  Filled 2022-01-02: qty 2
  Filled 2022-01-02 (×2): qty 1

## 2022-01-02 MED ORDER — SODIUM CHLORIDE 0.9 % IV SOLN
500.0000 mg | Freq: Once | INTRAVENOUS | Status: AC
  Administered 2022-01-02: 500 mg via INTRAVENOUS
  Filled 2022-01-02: qty 5

## 2022-01-02 MED ORDER — IPRATROPIUM-ALBUTEROL 0.5-2.5 (3) MG/3ML IN SOLN
3.0000 mL | RESPIRATORY_TRACT | Status: AC
  Administered 2022-01-02 (×3): 3 mL via RESPIRATORY_TRACT
  Filled 2022-01-02: qty 3

## 2022-01-02 MED ORDER — ALBUTEROL SULFATE (2.5 MG/3ML) 0.083% IN NEBU
5.0000 mg | INHALATION_SOLUTION | Freq: Once | RESPIRATORY_TRACT | Status: AC
  Administered 2022-01-02: 5 mg via RESPIRATORY_TRACT
  Filled 2022-01-02: qty 6

## 2022-01-02 MED ORDER — IPRATROPIUM-ALBUTEROL 0.5-2.5 (3) MG/3ML IN SOLN
3.0000 mL | Freq: Four times a day (QID) | RESPIRATORY_TRACT | Status: DC
  Administered 2022-01-02 – 2022-01-04 (×6): 3 mL via RESPIRATORY_TRACT
  Filled 2022-01-02 (×7): qty 3

## 2022-01-02 MED ORDER — ENOXAPARIN SODIUM 80 MG/0.8ML IJ SOSY
80.0000 mg | PREFILLED_SYRINGE | INTRAMUSCULAR | Status: DC
  Administered 2022-01-02 – 2022-01-03 (×2): 80 mg via SUBCUTANEOUS
  Filled 2022-01-02 (×3): qty 0.8

## 2022-01-02 MED ORDER — SODIUM CHLORIDE 0.9 % IV SOLN
1.0000 g | Freq: Once | INTRAVENOUS | Status: AC
  Administered 2022-01-02: 1 g via INTRAVENOUS
  Filled 2022-01-02: qty 10

## 2022-01-02 MED ORDER — DIPHENHYDRAMINE HCL 25 MG PO CAPS: 50.0000 mg | ORAL_CAPSULE | Freq: Once | ORAL | Status: AC

## 2022-01-02 MED ORDER — FUROSEMIDE 10 MG/ML IJ SOLN
40.0000 mg | Freq: Once | INTRAMUSCULAR | Status: AC
  Administered 2022-01-02: 40 mg via INTRAVENOUS
  Filled 2022-01-02: qty 4

## 2022-01-02 MED ORDER — SODIUM CHLORIDE 0.9 % IV SOLN
500.0000 mg | INTRAVENOUS | Status: DC
  Administered 2022-01-03 – 2022-01-04 (×2): 500 mg via INTRAVENOUS
  Filled 2022-01-02 (×3): qty 5

## 2022-01-02 MED ORDER — IOHEXOL 350 MG/ML SOLN
50.0000 mL | Freq: Once | INTRAVENOUS | Status: AC | PRN
  Administered 2022-01-02: 50 mL via INTRAVENOUS

## 2022-01-02 MED ORDER — ASPIRIN 81 MG PO TBEC
81.0000 mg | DELAYED_RELEASE_TABLET | Freq: Every day | ORAL | Status: DC
  Administered 2022-01-02 – 2022-01-04 (×3): 81 mg via ORAL
  Filled 2022-01-02 (×3): qty 1

## 2022-01-02 MED ORDER — SODIUM CHLORIDE 0.9 % IV SOLN
1.0000 g | INTRAVENOUS | Status: DC
  Administered 2022-01-03 – 2022-01-04 (×2): 1 g via INTRAVENOUS
  Filled 2022-01-02 (×2): qty 10

## 2022-01-02 MED ORDER — ESCITALOPRAM OXALATE 10 MG PO TABS
20.0000 mg | ORAL_TABLET | Freq: Every day | ORAL | Status: DC
  Administered 2022-01-02 – 2022-01-04 (×3): 20 mg via ORAL
  Filled 2022-01-02 (×3): qty 2

## 2022-01-02 MED ORDER — PREDNISONE 20 MG PO TABS
40.0000 mg | ORAL_TABLET | Freq: Every day | ORAL | Status: DC
  Administered 2022-01-03 – 2022-01-04 (×2): 40 mg via ORAL
  Filled 2022-01-02 (×2): qty 2

## 2022-01-02 MED ORDER — AEROCHAMBER PLUS FLO-VU LARGE MISC: 1.0000 | Freq: Once | Status: DC

## 2022-01-02 MED ORDER — BUDESONIDE 0.25 MG/2ML IN SUSP
0.2500 mg | Freq: Two times a day (BID) | RESPIRATORY_TRACT | Status: DC
  Administered 2022-01-02 – 2022-01-04 (×4): 0.25 mg via RESPIRATORY_TRACT
  Filled 2022-01-02 (×4): qty 2

## 2022-01-02 MED ORDER — DIPHENHYDRAMINE HCL 50 MG/ML IJ SOLN
50.0000 mg | Freq: Once | INTRAMUSCULAR | Status: AC
  Administered 2022-01-02: 50 mg via INTRAVENOUS
  Filled 2022-01-02: qty 1

## 2022-01-02 NOTE — ED Notes (Signed)
Gina RN notified of the patient's current situation.

## 2022-01-02 NOTE — ED Notes (Signed)
CT ordered with emergent pre meds for anaphylaxis. Pt received meds and ct scan was completed. Upon completion patient stated he was short of breath. Pt taken back to room and RN was made aware. RN in room with pt at this time.

## 2022-01-02 NOTE — ED Notes (Signed)
Per nurse tech Ronnald Ramp, patient may be transported upstairs and report given to nurse Janine Ores at bedside

## 2022-01-02 NOTE — ED Provider Notes (Signed)
  Physical Exam  BP 139/73   Pulse 67   Temp 98 F (36.7 C) (Oral)   Resp 18   Ht 1.803 m ('5\' 11"'$ )   Wt (!) 154.2 kg   SpO2 90%   BMI 47.42 kg/m   Physical Exam  Procedures  Procedures  ED Course / MDM   Clinical Course as of 01/02/22 0915  Sat Jan 02, 2022  0845 Chest x-Gabriel Love with small area of bilateral airspace disease most likely representing multifocal pneumonia with generalized airway thickening and patchy air trapping.  No evidence of pulmonary embolism is noted. [DR]    Clinical Course User Index [DR] [name], MD   Medical Decision Making Amount and/or Complexity of Data Reviewed Radiology: ordered.  Risk Prescription drug management. Risk Details: Care discussed with Dr.Gerghe who will see for admission   81 yo male with cough and dyspnea.  Wife with recent uri/virus.  Patient afebrile went to urgent care Monday.  TX's with steroids and inhaler. Congestion and wheezing worse.  No fever.  Mild hypoxia, cough of clear and white sputum. Right leg edematous with history of asymmetrical swelling. Left with splint in place. Covid flu negative Lasix given and uop > 2 liters Albuterol with some ongoing wheezing. Troponin ok BNP 103 Mild leukocytosis CTA pending, patient with history of contrast allergy and being pretreated. CTA to occur at 730 Plan reevaluation after cta and will evaluate for d/c vs admission  9:15 AM CT reviewed and noted small areas of bilateral airspace disease most consistent with multifocal pneumonia. Plan antibiotics. Patient's oxygen saturations 90% at rest Will add oxygen and plan admission for pneumonia Lungs, with diffuse rhonchi and some expiratory wheezes       [name], MD 01/02/22 [phone]

## 2022-01-02 NOTE — H&P (Signed)
History and Physical    Gabriel Love:[id number] DOB: December 31, 1940 DOA: 01/01/2022  I have briefly reviewed the patient's prior medical records in [facility]  PCP: Laurey Morale, MD  Patient coming from: home  Chief Complaint: difficulties breathing  HPI: Gabriel Love is a 81 y.o. male with medical history significant of asthma, prior tobacco use, morbid obesity, hypertension, depression, chronic diastolic CHF, who comes to the hospital with difficulties breathing.  He has been feeling sick for the past week, with nasal and chest congestion.  His wife was sick before him and he believes he may have gotten this from her.  He denies any fever or chills.  He denies any sore throat.  He has no abdominal pain, nausea vomiting or diarrhea.  He has no chest pain.  Reports chronic lower extremity swelling which is at baseline.  He was seen in urgent care, sent home however due to persistent symptoms decided to come back to the ER.  ED Course: In the ER he is afebrile, normotensive, satting well on room air.  CT angiogram shows multifocal pneumonia.  Felt really short of breath with minimal ambulation in the ED, has persistent wheezing despite continuous nebs, he was placed on antibiotics and we are asked to admit.  Review of Systems: All systems reviewed, and apart from HPI, all negative  Past Medical History:  Diagnosis Date   Abdominal hernia    Allergy    Blood transfusion without reported diagnosis 12/2006   At [facility] with hip replacement - 2 units transfused   Cancer ([place])    skin cancers   Cataract    Depression    DJD (degenerative joint disease)    shoulders bilateral   Environmental allergies    Full dentures    Grade I diastolic dysfunction 10/30/9145   Hearing loss    bilateral hearing aids   History of colonic polyps    History of urinary tract infection    Hyperlipidemia    under control   Hypertension    "borderline"   Nonspecific abnormal finding in stool  contents    Other general symptoms(780.99)    Prostate hypertrophy    sees Dr. Gaynelle Arabian    Urinary leakage     Past Surgical History:  Procedure Laterality Date   CATARACT EXTRACTION W/PHACO Right 03/25/2014   Procedure: CATARACT EXTRACTION PHACO AND INTRAOCULAR LENS PLACEMENT; CDE:9.76;  Surgeon: Williams Che, MD;  Location: [facility];  Service: Ophthalmology;  Laterality: Right;   CATARACT EXTRACTION W/PHACO Left 06/24/2014   Procedure: CATARACT EXTRACTION PHACO AND INTRAOCULAR LENS PLACEMENT (IOC);  Surgeon: Williams Che, MD;  Location: [facility];  Service: Ophthalmology;  Laterality: Left;  CDE:8.45   CIRCUMCISION N/A 03/26/2016   Procedure: CIRCUMCISION ADULT;  Surgeon: Carolan Clines, MD;  Location: [facility];  Service: Urology;  Laterality: N/ADenton Meek, [place]   COLONOSCOPY  06-19-13, 03/05/15   per Dr. 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 Percell Miller    LAPAROSCOPIC PARTIAL COLECTOMY N/A 08/07/2013   Procedure: LAPAROSCOPIC right PARTIAL COLECTOMY;  Surgeon: Leighton Ruff, MD;  Location: [facility];  Service: General;  Laterality: N/A;   MULTIPLE  TOOTH EXTRACTIONS     PROSTATE SURGERY  03-29-11   had CTT per Dr. Gaynelle Arabian, in office     reports that he quit smoking about 53 years ago. His smoking use included cigarettes. He has a 5.00 pack-year smoking history. He quit smokeless tobacco use about 6 years ago.  His smokeless tobacco use included chew. He reports that he does not drink alcohol and does not use drugs.  Allergies  Allergen Reactions   Ivp Dye [Iodinated Contrast Media] Anaphylaxis    Iv dye allergy in 1985, anaphylaxix//a.calhoun- "shocked back to  life"   Penicillins Other (See Comments)    Urticaria Has patient had a PCN reaction causing immediate rash, facial/tongue/throat swelling, SOB or lightheadedness with hypotension: no Has patient had a PCN reaction causing severe rash involving mucus membranes or skin necrosis: no Has patient had a PCN reaction that required hospitalization no Has patient had a PCN reaction occurring within the last 10 years: yes If all of the above answers are "NO", then may proceed with Cephalosporin use.    Lisinopril Cough    Family History  Problem Relation Age of Onset   Hypertension Mother    Diabetes Mother    Heart failure Mother        cad/mi with rupture ventricle   Hyperlipidemia Father    Colon polyps Father    Cancer Neg Hx        colon or prostate   Colon cancer Neg Hx    Esophageal cancer Neg Hx    Rectal cancer Neg Hx    Stomach cancer Neg Hx     Prior to Admission medications   Medication Sig Start Date End Date Taking? Authorizing Provider  albuterol (VENTOLIN HFA) 108 (90 Base) MCG/ACT inhaler Inhale 2 puffs into the lungs every 4 (four) hours as needed for wheezing or shortness of breath. 12/28/21  Yes Volney American, PA-C  Ascorbic Acid (VITAMIN C PO) Take 1 tablet by mouth daily.   Yes [provider]  aspirin EC 81 MG tablet Take 81 mg by mouth daily.   Yes [provider]  atorvastatin (LIPITOR) 40 MG tablet TAKE 1 TABLET BY MOUTH DAILY 11/27/21  Yes Laurey Morale, MD  Cholecalciferol (VITAMIN D3 PO) Take 1 tablet by mouth daily at 12 noon.   Yes [provider]  furosemide (LASIX) 40 MG tablet TAKE 1 TABLET(40 MG) BY MOUTH TWICE DAILY Patient taking differently: Take 40 mg by mouth daily. 12/09/21  Yes Laurey Morale, MD  LEXAPRO 20 MG tablet TAKE 1 TABLET(20 MG) BY MOUTH DAILY 12/15/20  Yes Laurey Morale, MD  metoprolol succinate (TOPROL-XL) 50 MG 24 hr tablet TAKE 1 TABLET BY MOUTH EVERY DAY 02/26/21  Yes Laurey Morale, MD  Multiple  Vitamins-Minerals (CENTRUM ADULTS PO) Take 1 tablet by mouth daily.   Yes [provider]  mupirocin cream (BACTROBAN) 2 % Apply 1 application topically 2 (two) times daily. Patient taking differently: Apply 1 application  topically daily as needed (for irritation). 03/31/21  Yes Laurey Morale, MD  potassium chloride SA (KLOR-CON M) 20 MEQ tablet TAKE 1 TABLET(20 MEQ) BY MOUTH DAILY Patient taking differently: Take 20 mEq by mouth daily. 11/27/21  Yes Laurey Morale, MD  predniSONE (DELTASONE) 20 MG tablet Take 2 tablets (40 mg total) by mouth daily with breakfast. 12/28/21  Yes Volney American, PA-C  VITAMIN E PO Take 1 capsule by mouth daily at 12 noon.   Yes  [provider]    Physical Exam: Vitals:   01/02/22 0805 01/02/22 0815 01/02/22 0830 01/02/22 0920  BP: (!) 148/55 135/87 139/73   Pulse: 72 64 67   Resp: (!) 23 (!) 22 18   Temp:    98.3 F (36.8 C)  TempSrc:    Oral  SpO2: 90% 92% 90%   Weight:      Height:          Constitutional: NAD, calm, comfortable Eyes: PERRL, lids and conjunctivae normal ENMT: Mucous membranes are moist.  Neck: normal, supple Respiratory: Diffuse bilateral rhonchi, wheezing, increased respiratory effort, tachypneic Cardiovascular: Regular rate and rhythm, no murmurs / rubs / gallops. No extremity edema. 2+ pedal pulses.  Abdomen: no tenderness, no masses palpated. Bowel sounds positive.  Musculoskeletal: no clubbing / cyanosis. Normal muscle tone.  Skin: no rashes, lesions, ulcers. No induration Neurologic: CN 2-12 grossly intact. Strength 5/5 in all 4.  Psychiatric: Normal judgment and insight. Alert and oriented x 3. Normal mood.   Labs on Admission: I have personally reviewed following labs and imaging studies  CBC: Recent Labs  Lab 01/01/22 1232  WBC 13.0*  NEUTROABS 10.5*  HGB 15.5  HCT 48.4  MCV 93.4  PLT 818   Basic Metabolic Panel: Recent Labs  Lab 01/01/22 1232  NA 142  K 4.2  CL 104  CO2 30   GLUCOSE 132*  BUN 20  CREATININE 0.96  CALCIUM 8.5*   Liver Function Tests: Recent Labs  Lab 01/01/22 1232  AST 24  ALT 26  ALKPHOS 72  BILITOT 0.8  PROT 6.5  ALBUMIN 3.4*   Coagulation Profile: No results for input(s): "INR", "PROTIME" in the last 168 hours. BNP (last 3 results) No results for input(s): "PROBNP" in the last 8760 hours. CBG: No results for input(s): "GLUCAP" in the last 168 hours. Thyroid Function Tests: No results for input(s): "TSH", "T4TOTAL", "FREET4", "T3FREE", "THYROIDAB" in the last 72 hours. Urine analysis:    Component Value Date/Time   COLORURINE YELLOW 05/08/2020 1614   APPEARANCEUR HAZY (A) 05/08/2020 1614   LABSPEC 1.015 05/08/2020 1614   PHURINE 5.0 05/08/2020 1614   GLUCOSEU NEGATIVE 05/08/2020 1614   GLUCOSEU NEGATIVE 07/05/2006 0824   HGBUR NEGATIVE 05/08/2020 [place] 05/08/2020 1614   BILIRUBINUR neg 02/26/2016 1108   KETONESUR NEGATIVE 05/08/2020 1614   PROTEINUR 30 (A) 05/08/2020 1614   UROBILINOGEN 0.2 02/26/2016 1108   UROBILINOGEN 1.0 12/24/2012 1307   NITRITE NEGATIVE 05/08/2020 1614   LEUKOCYTESUR LARGE (A) 05/08/2020 1614     Radiological Exams on Admission: CT Angio Chest PE W and/or Wo Contrast  Result Date: 01/02/2022 CLINICAL DATA:  Pulmonary embolism. Shortness of breath with cough and wheezing EXAM: CT ANGIOGRAPHY CHEST WITH CONTRAST TECHNIQUE: Multidetector CT imaging of the chest was performed using the standard protocol during bolus administration of intravenous contrast. Multiplanar CT image reconstructions and MIPs were obtained to evaluate the vascular anatomy. RADIATION DOSE REDUCTION: This exam was performed according to the departmental dose-optimization program which includes automated exposure control, adjustment of the mA and/or kV according to patient size and/or use of iterative reconstruction technique. CONTRAST:  65m OMNIPAQUE IOHEXOL 350 MG/ML SOLN COMPARISON:  06/27/2013  FINDINGS: Cardiovascular: Satisfactory opacification of the pulmonary arteries to the segmental level. No evidence of pulmonary embolism when accounting for motion artifact which especially affects segmental and subsegmental branches at the bases. Normal heart size. No pericardial effusion. Atheromatous calcification of the aorta and coronaries Mediastinum/Nodes: Negative for  adenopathy or mass. Lungs/Pleura: Generalized airway thickening with patchy air trapping. Peripheral airway opacifications. Small area of nodular airspace opacity in the right upper lobe on 7:84 measuring 13 mm. Area of solid and sub solid opacity in the right middle lobe on 7:237. There are a few areas of patchy opacity in the left lung. No edema, effusion, or pneumothorax. Upper Abdomen: No acute finding Musculoskeletal: Generalized spondylosis with multi-level bridging osteophyte. Severe bilateral shoulder degeneration. Review of the MIP images confirms the above findings. IMPRESSION: 1. Small areas of bilateral airspace disease, most likely early multifocal pneumonia. There is a background of generalized airway thickening and patchy air trapping. Some of the opacities have nodular features and follow-up is recommended after treatment; will need CT follow-up given these areas are small and occult by radiography. 2. No evidence of pulmonary embolism when allowing for motion artifact. 3. Atherosclerosis including the coronary arteries. Electronically Signed   By: Jorje Guild M.D.   On: 01/02/2022 08:29   DG Chest 2 View  Result Date: 01/01/2022 CLINICAL DATA:  Shortness of breath EXAM: CHEST - 2 VIEW COMPARISON:  12/28/2021 FINDINGS: Normal cardiac and mediastinal contours given AP technique. No focal pulmonary opacity. No pleural effusion. No pneumothorax. No acute osseous abnormality. IMPRESSION: No active cardiopulmonary disease. Electronically Signed   By: Merilyn Baba M.D.   On: 01/01/2022 12:48    EKG: Independently reviewed.   Sinus rhythm  Assessment/Plan Principal Problem:   Multifocal pneumonia Active Problems:   Hyperlipidemia   Essential hypertension   Nonspecific abnormal finding in stool contents   Morbid obesity (HCC)   Depression with anxiety   Grade I diastolic dysfunction   Asthma exacerbation   Principal problem Multifocal pneumonia-patient has been placed on antibiotics with ceftriaxone and azithromycin, continue.  COVID and flu are negative, obtain respiratory virus panel, strep pneumo antigen, Legionella antigen.  Continue to closely monitor  Active problems Acute asthma exacerbation-due to #1, placed on Pulmicort, DuoNebs, steroids.  Still has wheezing and is tachypneic  Chronic diastolic CHF-most recent 2D echo in 2018 shows normal EF, grade 1 diastolic dysfunction.  Has chronic lower extremity swelling.  Has received Lasix in the ED but does not appear significantly fluid overloaded.  Hold further doses and reassess in the morning  Essential hypertension-resume home metoprolol  Hyperlipidemia-resume statin  Depression, anxiety-continue home medications  Morbid obesity-BMI 47, he would benefit from weight loss  DVT prophylaxis: Lovenox  Code Status: Full code  Family Communication: wife at bedside  Disposition Plan: home when ready  Bed Type: medsurg Consults called: none  Obs/Inp: obs   Marzetta Board, MD, PhD [facility]  Contact via [email or web address]  01/02/2022, 10:03 AM

## 2022-01-02 NOTE — Care Plan (Signed)
CT with Contrast ordered on this pt with Anaphylaxis reaction to IV contrast. Per MD, will do emergent pre-medication protocol. At this time, pt. Has not received an IV or pre-med. Will continue to monitor chart to see when pre-med is given.

## 2022-01-02 NOTE — ED Notes (Signed)
Charge nurse and patient made aware of the reason for the delay of care at this time. Both verbalize understanding.

## 2022-01-02 NOTE — ED Provider Notes (Signed)
[facility] EMERGENCY DEPARTMENT Provider Note   CSN: [id number] Arrival date & time: 01/01/22  1203     History  Chief Complaint  Patient presents with   Shortness of Gabriel Love is a 81 y.o. male.  81 year old male who presents the ER today with shortness of breath.  Patient states he has a history of heart failure is on Lasix has been compliant.  He has persistent lower extremity swelling which he states has not worsened recently.  States he gets real dyspneic when he lays flat and when he ambulates.  Him and his wife state that she had a upper respiratory infection last Saturday and his symptoms started on Monday with cough.  He went to urgent care where they gave him a shot of something, steroids and an inhaler.  He showed me how he uses his inhaler which is not necessarily appropriate but he has been using that and then the prednisone since then and has not really got much better so presents here for further evaluation.  He has felt some chills but no fevers.  No chest pain.  No lightheadedness.  Has generalized weakness but that is not new.   Shortness of Breath      Home Medications Prior to Admission medications   Medication Sig Start Date End Date Taking? Authorizing Provider  albuterol (VENTOLIN HFA) 108 (90 Base) MCG/ACT inhaler Inhale 2 puffs into the lungs every 4 (four) hours as needed for wheezing or shortness of breath. 12/28/21  Yes Volney American, PA-C  Ascorbic Acid (VITAMIN C PO) Take 1 tablet by mouth daily.   Yes [provider]  aspirin EC 81 MG tablet Take 81 mg by mouth daily.   Yes [provider]  atorvastatin (LIPITOR) 40 MG tablet TAKE 1 TABLET BY MOUTH DAILY 11/27/21  Yes Laurey Morale, MD  Cholecalciferol (VITAMIN D3 PO) Take 1 tablet by mouth daily at 12 noon.   Yes [provider]  furosemide (LASIX) 40 MG tablet TAKE 1 TABLET(40 MG) BY MOUTH TWICE DAILY Patient taking differently:  Take 40 mg by mouth daily. 12/09/21  Yes Laurey Morale, MD  LEXAPRO 20 MG tablet TAKE 1 TABLET(20 MG) BY MOUTH DAILY 12/15/20  Yes Laurey Morale, MD  metoprolol succinate (TOPROL-XL) 50 MG 24 hr tablet TAKE 1 TABLET BY MOUTH EVERY DAY 02/26/21  Yes Laurey Morale, MD  Multiple Vitamins-Minerals (CENTRUM ADULTS PO) Take 1 tablet by mouth daily.   Yes [provider]  mupirocin cream (BACTROBAN) 2 % Apply 1 application topically 2 (two) times daily. Patient taking differently: Apply 1 application  topically daily as needed (for irritation). 03/31/21  Yes Laurey Morale, MD  potassium chloride SA (KLOR-CON M) 20 MEQ tablet TAKE 1 TABLET(20 MEQ) BY MOUTH DAILY Patient taking differently: Take 20 mEq by mouth daily. 11/27/21  Yes Laurey Morale, MD  predniSONE (DELTASONE) 20 MG tablet Take 2 tablets (40 mg total) by mouth daily with breakfast. 12/28/21  Yes Volney American, PA-C  VITAMIN E PO Take 1 capsule by mouth daily at 12 noon.   Yes [provider]      Allergies    Ivp dye [iodinated contrast media], Penicillins, and Lisinopril    Review of Systems   Review of Systems  Respiratory:  Positive for shortness of breath.     Physical Exam Updated Vital Signs BP (!) 149/91   Pulse 79   Temp 98  F (36.7 C) (Oral)   Resp 19   Ht '5\' 11"'$  (1.803 m)   Wt (!) 154.2 kg   SpO2 91%   BMI 47.42 kg/m  Physical Exam Vitals and nursing note reviewed.  Constitutional:      Appearance: He is well-developed.  HENT:     Head: Normocephalic and atraumatic.  Cardiovascular:     Rate and Rhythm: Normal rate.  Pulmonary:     Effort: Pulmonary effort is normal. No respiratory distress.     Breath sounds: Wheezing and rales present.  Abdominal:     General: There is no distension.  Musculoskeletal:        General: Normal range of motion.     Cervical back: Normal range of motion.     Right lower leg: Edema (She states this is always swollen but it is very tight with some  fluid-filled small bulla and mildly erythematous) present.  Neurological:     Mental Status: He is alert.     ED Results / Procedures / Treatments   Labs (all labs ordered are listed, but only abnormal results are displayed) Labs Reviewed  COMPREHENSIVE METABOLIC PANEL - Abnormal; Notable for the following components:      Result Value   Glucose, Bld 132 (*)    Calcium 8.5 (*)    Albumin 3.4 (*)    All other components within normal limits  CBC WITH DIFFERENTIAL/PLATELET - Abnormal; Notable for the following components:   WBC 13.0 (*)    Neutro Abs 10.5 (*)    Abs Immature Granulocytes 0.12 (*)    All other components within normal limits  BRAIN NATRIURETIC PEPTIDE - Abnormal; Notable for the following components:   B Natriuretic Peptide 103.0 (*)    All other components within normal limits  RESP PANEL BY RT-PCR (FLU A&B, COVID) ARPGX2  TROPONIN I (HIGH SENSITIVITY)  TROPONIN I (HIGH SENSITIVITY)    EKG EKG Interpretation  Date/Time:  Friday January 01 2022 12:18:57 EDT Ventricular Rate:  78 PR Interval:  166 QRS Duration: 82 QT Interval:  362 QTC Calculation: 412 R Axis:   51 Text Interpretation: Normal sinus rhythm Cannot rule out Anterior infarct , age undetermined Abnormal ECG When compared with ECG of 08-May-2020 17:00, PREVIOUS ECG IS PRESENT Confirmed by Merrily Pew [phone]) on 01/01/2022 11:00:10 PM  Radiology DG Chest 2 View  Result Date: 01/01/2022 CLINICAL DATA:  Shortness of breath EXAM: CHEST - 2 VIEW COMPARISON:  12/28/2021 FINDINGS: Normal cardiac and mediastinal contours given AP technique. No focal pulmonary opacity. No pleural effusion. No pneumothorax. No acute osseous abnormality. IMPRESSION: No active cardiopulmonary disease. Electronically Signed   By: Merilyn Baba M.D.   On: 01/01/2022 12:48    Procedures .Critical Care  Performed by: Merrily Pew, MD Authorized by: Merrily Pew, MD   Critical care provider statement:    Critical care  time (minutes):  30   Critical care was necessary to treat or prevent imminent or life-threatening deterioration of the following conditions:  Respiratory failure   Critical care was time spent personally by me on the following activities:  Development of treatment plan with patient or surrogate, discussions with consultants, evaluation of patient's response to treatment, examination of patient, ordering and review of laboratory studies, ordering and review of radiographic studies, ordering and performing treatments and interventions, pulse oximetry, re-evaluation of patient's condition and review of old charts     Medications Ordered in ED Medications  diphenhydrAMINE (BENADRYL) capsule 50 mg (has no administration  in time range)    Or  diphenhydrAMINE (BENADRYL) injection 50 mg (has no administration in time range)  furosemide (LASIX) injection 40 mg (40 mg Intravenous Given 01/02/22 0252)  ipratropium-albuterol (DUONEB) 0.5-2.5 (3) MG/3ML nebulizer solution 3 mL (3 mLs Nebulization Given by Other 01/02/22 0459)  methylPREDNISolone sodium succinate (SOLU-MEDROL) 40 mg/mL injection 40 mg (40 mg Intravenous Given 01/02/22 0252)    ED Course/ Medical Decision Making/ A&P Clinical Course as of 01/02/22 2338  Sat Jan 02, 2022  0845 Chest x-ray with small area of bilateral airspace disease most likely representing multifocal pneumonia with generalized airway thickening and patchy air trapping.  No evidence of pulmonary embolism is noted. [DR]    Clinical Course User Index [DR] [name], MD                           Medical Decision Making Amount and/or Complexity of Data Reviewed Radiology: ordered.  Risk Prescription drug management. Decision regarding hospitalization.  Could be persistent URI type symptoms she does not have what sounds like pneumonia and his chest x-ray does not showed either.  His leg is significant swollen and tight consistent with fluid overload and he has  crackles on exam consistent as well.  The wheezing could be cardiac in nature.  Without swollen leg also concern for possible blood clots will need a CT scan to rule it out he has a contrast allergy so will need to be premedicated.  We will go and diurese in the meantime.  Also give some breathing treatments.  I showed him how to use the albuterol inhaler properly but will need a spacer prior to leaving.  We will decide on antibiotics after CT scan.  Reevaluation his leg edema has improved and is actually softer now although still swollen.  He states it is chronically swollen related to a past injury so I suspect he does have some venous insufficiency there that would probably explain the color and the persistent swelling but I think it is also compounded by new heart failure exacerbation.  His BNP was not significantly elevated but he is quite obese which could make that less reliable.  Diuresed a significant amount. Still with some wheezing on exam, but audible wheezing significantly improved. Care transferred pending ct scan.   Final Clinical Impression(s) / ED Diagnoses Final diagnoses:  None    Rx / DC Orders ED Discharge Orders     None         Mohogany Toppins, Corene Cornea, MD 01/02/22 2340

## 2022-01-03 DIAGNOSIS — J189 Pneumonia, unspecified organism: Secondary | ICD-10-CM | POA: Diagnosis not present

## 2022-01-03 LAB — BASIC METABOLIC PANEL
Anion gap: 9 (ref 5–15)
BUN: 18 mg/dL (ref 8–23)
CO2: 29 mmol/L (ref 22–32)
Calcium: 8.1 mg/dL — ABNORMAL LOW (ref 8.9–10.3)
Chloride: 102 mmol/L (ref 98–111)
Creatinine, Ser: 0.71 mg/dL (ref 0.61–1.24)
GFR, Estimated: >60 mL/min (ref >60)
Glucose, Bld: 78 mg/dL (ref 70–99)
Potassium: 3.5 mmol/L (ref 3.5–5.1)
Sodium: 140 mmol/L (ref 135–145)

## 2022-01-03 LAB — HIV ANTIBODY (ROUTINE TESTING W REFLEX): HIV Screen 4th Generation wRfx: NONREACTIVE

## 2022-01-03 MED ORDER — POTASSIUM CHLORIDE CRYS ER 20 MEQ PO TBCR
40.0000 meq | EXTENDED_RELEASE_TABLET | Freq: Once | ORAL | Status: AC
  Administered 2022-01-03: 40 meq via ORAL
  Filled 2022-01-03: qty 2

## 2022-01-03 MED ORDER — FUROSEMIDE 10 MG/ML IJ SOLN
40.0000 mg | Freq: Once | INTRAMUSCULAR | Status: AC
  Administered 2022-01-03: 40 mg via INTRAVENOUS
  Filled 2022-01-03: qty 4

## 2022-01-03 NOTE — Progress Notes (Signed)
PROGRESS NOTE  Gabriel Love MEQ:[id number] DOB: Aug 05, 1940 DOA: 01/01/2022 PCP: Laurey Morale, MD   LOS: 1 day   Brief Narrative / Interim history: 81 y.o. male with medical history significant of asthma, prior tobacco use, morbid obesity, hypertension, depression, chronic diastolic CHF, who comes to the hospital with difficulties breathing.  He has been feeling sick for the past week, with nasal and chest congestion.  His wife was sick before him and he believes he may have gotten this from her  Subjective / 24h Interval events: Feeling better, breathing is better.  Still has a lot of wheezing  Assesement and Plan: Principal Problem:   Multifocal pneumonia Active Problems:   Hyperlipidemia   Essential hypertension   Nonspecific abnormal finding in stool contents   Morbid obesity ([place])   Depression with anxiety   Grade I diastolic dysfunction   Asthma exacerbation   Principal problem Multifocal pneumonia, rhinovirus infection-patient has been placed on antibiotics with ceftriaxone and azithromycin, continue.  COVID and flu are negative, RVP positive for rhinovirus.  Overall improving   Active problems Acute asthma exacerbation-due to #1, placed on Pulmicort, DuoNebs, steroids.  Continues to have wheezing today   Chronic diastolic CHF-most recent 2D echo in 2018 shows normal EF, grade 1 diastolic dysfunction.  Has chronic lower extremity swelling.  Has received Lasix in the ED but does not appear significantly fluid overloaded.  Repeat BMP, may need another dose of Lasix this afternoon   Essential hypertension-continue home metoprolol   Hyperlipidemia-continue home statin   Depression, anxiety-continue home medications   Morbid obesity-BMI 47, he would benefit from weight loss  Scheduled Meds:  AeroChamber Plus Flo-Vu Large  1 each Other Once   aspirin EC  81 mg Oral Daily   atorvastatin  40 mg Oral Daily   budesonide (PULMICORT) nebulizer solution  0.25 mg Nebulization  BID   enoxaparin (LOVENOX) injection  80 mg Subcutaneous Q24H   escitalopram  20 mg Oral Daily   ipratropium-albuterol  3 mL Nebulization Q6H   metoprolol succinate  50 mg Oral Daily   predniSONE  40 mg Oral Q breakfast   Continuous Infusions:  azithromycin 500 mg (01/03/22 0743)   cefTRIAXone (ROCEPHIN)  IV 1 g (01/03/22 0635)   PRN Meds:.  Current Outpatient Medications  Medication Instructions   albuterol (VENTOLIN HFA) 108 (90 Base) MCG/ACT inhaler 2 puffs, Inhalation, Every 4 hours PRN   Ascorbic Acid (VITAMIN C PO) 1 tablet, Oral, Daily   aspirin EC 81 mg, Oral, Daily   atorvastatin (LIPITOR) 40 mg, Oral, Daily   Cholecalciferol (VITAMIN D3 PO) 1 tablet, Oral, Daily   furosemide (LASIX) 40 MG tablet TAKE 1 TABLET(40 MG) BY MOUTH TWICE DAILY   LEXAPRO 20 MG tablet TAKE 1 TABLET(20 MG) BY MOUTH DAILY   metoprolol succinate (TOPROL-XL) 50 MG 24 hr tablet TAKE 1 TABLET BY MOUTH EVERY DAY   Multiple Vitamins-Minerals (CENTRUM ADULTS PO) 1 tablet, Oral, Daily   mupirocin cream (BACTROBAN) 2 % 1 application , Topical, 2 times daily   potassium chloride SA (KLOR-CON M) 20 MEQ tablet TAKE 1 TABLET(20 MEQ) BY MOUTH DAILY   predniSONE (DELTASONE) 40 mg, Oral, Daily with breakfast   VITAMIN E PO 1 capsule, Oral, Daily    Diet Orders (From admission, onward)     Start     Ordered   01/02/22 1527  Diet Heart Room service appropriate? Yes; Fluid consistency: Thin  Diet effective now       Question Answer  Comment  Room service appropriate? Yes   Fluid consistency: Thin      01/02/22 1526            DVT prophylaxis:    Lab Results  Component Value Date   PLT 260 01/01/2022      Code Status: Full Code  Family Communication: Wife at bedside  Status is: Inpatient Remains inpatient appropriate because: Persistent wheezing   Level of care: Med-Surg  Consultants:  none  Objective: Vitals:   01/02/22 1808 01/02/22 2052 01/03/22 0405 01/03/22 0716  BP: (!) 171/83  (!) 136/124 (!) 148/83 (!) 160/78  Pulse: 66 74 66 60  Resp: (!) '22 17 18 18  '$ Temp: 98 F (36.7 C) 98.3 F (36.8 C) 98.7 F (37.1 C) 98 F (36.7 C)  TempSrc: Oral  Oral Oral  SpO2: 95% 94% 92% 93%  Weight:      Height:        Intake/Output Summary (Last 24 hours) at 01/03/2022 1314 Last data filed at 01/03/2022 1011 Gross per 24 hour  Intake 240 ml  Output 1900 ml  Net -1660 ml   Wt Readings from Last 3 Encounters:  01/01/22 (!) 154.2 kg  03/31/21 (!) 152.4 kg  12/15/20 (!) 149.7 kg    Examination:  Constitutional: NAD Eyes: no scleral icterus ENMT: Mucous membranes are moist.  Neck: normal, supple Respiratory: Diffuse bilateral rhonchi, and expiratory wheezing present Cardiovascular: Regular rate and rhythm, no murmurs / rubs / gallops.  1+ edema Abdomen: non distended, no tenderness. Bowel sounds positive.  Musculoskeletal: no clubbing / cyanosis.  Skin: no rashes Neurologic: non focal   Data Reviewed: I have independently reviewed following labs and imaging studies   CBC Recent Labs  Lab 01/01/22 1232  WBC 13.0*  HGB 15.5  HCT 48.4  PLT 260  MCV 93.4  MCH 29.9  MCHC 32.0  RDW 14.4  LYMPHSABS 1.5  MONOABS 0.9  EOSABS 0.0  BASOSABS 0.0    Recent Labs  Lab 01/01/22 1232  NA 142  K 4.2  CL 104  CO2 30  GLUCOSE 132*  BUN 20  CREATININE 0.96  CALCIUM 8.5*  AST 24  ALT 26  ALKPHOS 72  BILITOT 0.8  ALBUMIN 3.4*  BNP 103.0*    ------------------------------------------------------------------------------------------------------------------ No results for input(s): "CHOL", "HDL", "LDLCALC", "TRIG", "CHOLHDL", "LDLDIRECT" in the last 72 hours.  Lab Results  Component Value Date   HGBA1C 5.3 01/21/2020   ------------------------------------------------------------------------------------------------------------------ No results for input(s): "TSH", "T4TOTAL", "T3FREE", "THYROIDAB" in the last 72 hours.  Invalid input(s):  "FREET3"  Cardiac Enzymes No results for input(s): "CKMB", "TROPONINI", "MYOGLOBIN" in the last 168 hours.  Invalid input(s): "CK" ------------------------------------------------------------------------------------------------------------------    Component Value Date/Time   BNP 103.0 (H) 01/01/2022 1232    CBG: No results for input(s): "GLUCAP" in the last 168 hours.  Recent Results (from the past 240 hour(s))  Resp Panel by RT-PCR (Flu A&B, Covid) Anterior Nasal Swab     Status: None   Collection Time: 12/28/21  9:39 AM   Specimen: Anterior Nasal Swab  Result Value Ref Range Status   SARS Coronavirus 2 by RT PCR NEGATIVE NEGATIVE Final    Comment: (NOTE) SARS-CoV-2 target nucleic acids are NOT DETECTED.  The SARS-CoV-2 RNA is generally detectable in upper respiratory specimens during the acute phase of infection. The lowest concentration of SARS-CoV-2 viral copies this assay can detect is 138 copies/mL. A negative result does not preclude SARS-Cov-2 infection and should not be used as the  sole basis for treatment or other patient management decisions. A negative result may occur with  improper specimen collection/handling, submission of specimen other than nasopharyngeal swab, presence of viral mutation(s) within the areas targeted by this assay, and inadequate number of viral copies(<138 copies/mL). A negative result must be combined with clinical observations, patient history, and epidemiological information. The expected result is Negative.  Fact Sheet for Patients:  EntrepreneurPulse.com.au  Fact Sheet for Healthcare Providers:  IncredibleEmployment.be  This test is no t yet approved or cleared by the [place] FDA and  has been authorized for detection and/or diagnosis of SARS-CoV-2 by FDA under an Emergency Use Authorization (EUA). This EUA will remain  in effect (meaning this test can be used) for the duration of  the COVID-19 declaration under Section 564(b)(1) of the Act, 21 U.S.C.section 360bbb-3(b)(1), unless the authorization is terminated  or revoked sooner.       Influenza A by PCR NEGATIVE NEGATIVE Final   Influenza B by PCR NEGATIVE NEGATIVE Final    Comment: (NOTE) The Xpert Xpress SARS-CoV-2/FLU/RSV plus assay is intended as an aid in the diagnosis of influenza from Nasopharyngeal swab specimens and should not be used as a sole basis for treatment. Nasal washings and aspirates are unacceptable for Xpert Xpress SARS-CoV-2/FLU/RSV testing.  Fact Sheet for Patients: EntrepreneurPulse.com.au  Fact Sheet for Healthcare Providers: IncredibleEmployment.be  This test is not yet approved or cleared by the [place] FDA and has been authorized for detection and/or diagnosis of SARS-CoV-2 by FDA under an Emergency Use Authorization (EUA). This EUA will remain in effect (meaning this test can be used) for the duration of the COVID-19 declaration under Section 564(b)(1) of the Act, 21 U.S.C. section 360bbb-3(b)(1), unless the authorization is terminated or revoked.  Performed at [facility], [place] [address], [place], [place] [address]   Resp Panel by RT-PCR (Flu A&B, Covid) Anterior Nasal Swab     Status: None   Collection Time: 01/01/22 12:24 PM   Specimen: Anterior Nasal Swab  Result Value Ref Range Status   SARS Coronavirus 2 by RT PCR NEGATIVE NEGATIVE Final    Comment: (NOTE) SARS-CoV-2 target nucleic acids are NOT DETECTED.  The SARS-CoV-2 RNA is generally detectable in upper respiratory specimens during the acute phase of infection. The lowest concentration of SARS-CoV-2 viral copies this assay can detect is 138 copies/mL. A negative result does not preclude SARS-Cov-2 infection and should not be used as the sole basis for treatment or other patient management decisions. A negative result may occur with  improper specimen  collection/handling, submission of specimen other than nasopharyngeal swab, presence of viral mutation(s) within the areas targeted by this assay, and inadequate number of viral copies(<138 copies/mL). A negative result must be combined with clinical observations, patient history, and epidemiological information. The expected result is Negative.  Fact Sheet for Patients:  EntrepreneurPulse.com.au  Fact Sheet for Healthcare Providers:  IncredibleEmployment.be  This test is no t yet approved or cleared by the [place] FDA and  has been authorized for detection and/or diagnosis of SARS-CoV-2 by FDA under an Emergency Use Authorization (EUA). This EUA will remain  in effect (meaning this test can be used) for the duration of the COVID-19 declaration under Section 564(b)(1) of the Act, 21 U.S.C.section 360bbb-3(b)(1), unless the authorization is terminated  or revoked sooner.       Influenza A by PCR NEGATIVE NEGATIVE Final   Influenza B by PCR NEGATIVE NEGATIVE Final    Comment: (NOTE) The  Xpert Xpress SARS-CoV-2/FLU/RSV plus assay is intended as an aid in the diagnosis of influenza from Nasopharyngeal swab specimens and should not be used as a sole basis for treatment. Nasal washings and aspirates are unacceptable for Xpert Xpress SARS-CoV-2/FLU/RSV testing.  Fact Sheet for Patients: EntrepreneurPulse.com.au  Fact Sheet for Healthcare Providers: IncredibleEmployment.be  This test is not yet approved or cleared by the [place] FDA and has been authorized for detection and/or diagnosis of SARS-CoV-2 by FDA under an Emergency Use Authorization (EUA). This EUA will remain in effect (meaning this test can be used) for the duration of the COVID-19 declaration under Section 564(b)(1) of the Act, 21 U.S.C. section 360bbb-3(b)(1), unless the authorization is terminated or revoked.  Performed at [facility], [place] [address], [place], [place] [address]   Respiratory (~20 pathogens) panel by PCR     Status: Abnormal   Collection Time: 01/02/22  9:14 AM   Specimen: Nasopharyngeal Swab; Respiratory  Result Value Ref Range Status   Adenovirus NOT DETECTED NOT DETECTED Final   Coronavirus 229E NOT DETECTED NOT DETECTED Final    Comment: (NOTE) The Coronavirus on the Respiratory Panel, DOES NOT test for the novel  Coronavirus (2019 nCoV)    Coronavirus HKU1 NOT DETECTED NOT DETECTED Final   Coronavirus NL63 NOT DETECTED NOT DETECTED Final   Coronavirus OC43 NOT DETECTED NOT DETECTED Final   Metapneumovirus NOT DETECTED NOT DETECTED Final   Rhinovirus / Enterovirus DETECTED (A) NOT DETECTED Final   Influenza A NOT DETECTED NOT DETECTED Final   Influenza B NOT DETECTED NOT DETECTED Final   Parainfluenza Virus 1 NOT DETECTED NOT DETECTED Final   Parainfluenza Virus 2 NOT DETECTED NOT DETECTED Final   Parainfluenza Virus 3 NOT DETECTED NOT DETECTED Final   Parainfluenza Virus 4 NOT DETECTED NOT DETECTED Final   Respiratory Syncytial Virus NOT DETECTED NOT DETECTED Final   Bordetella pertussis NOT DETECTED NOT DETECTED Final   Bordetella Parapertussis NOT DETECTED NOT DETECTED Final   Chlamydophila pneumoniae NOT DETECTED NOT DETECTED Final   Mycoplasma pneumoniae NOT DETECTED NOT DETECTED Final    Comment: Performed at [facility], [address], [place], [place] [address]     Radiology Studies: No results found.   Marzetta Board, MD, PhD [facility]  Between 7 am - 7 pm I am available, please contact me via Amion (for emergencies) or Securechat (non urgent messages)  Between 7 pm - 7 am I am not available, please contact night coverage MD/APP via Amion

## 2022-01-03 NOTE — Evaluation (Signed)
Physical Therapy Evaluation Patient Details Name: Gabriel Love MRN: [id number] DOB: 1941/02/04 Today's Date: 01/03/2022  History of Present Illness  The pt is an 81 yo male presenting 10/13 with SOB, cough, congestion, and wheezing. CT indicates multifocal pneumonia. PMH includes: skin cancer, depression, HLD, HTN, morbid obesity, incontinence, and anxiety.   Clinical Impression  Pt in bed upon arrival of PT, agreeable to evaluation at this time. Prior to admission the pt was independent with use of RW or rollator for mobility in the home or community, and reports he was independent with ADLs. The pt now presents with limitations in functional mobility, activity tolerance, and endurance due to above dx, and will continue to benefit from skilled PT to address these deficits. The pt was able to complete sit-stand transfers and hallway ambulation with minG and use of RW, but demos increased SOB and 3/4 DOE with activity requiring seated rest to recover. With 5x sit-stand, the pt's SpO2 dropped to low of 86% on RA, but recovered to 90s within 20 seconds of seated rest. Pt and his wife educated on energy conservation strategies as well as progressive return to mobility and seated rest guided by pt sx. Both expressed understanding and agreement. Will maintain on caseload during acute stay but pt is safe to return home with current DME and assist as needed from wife when medically stable for d/c.         Recommendations for follow up therapy are one component of a multi-disciplinary discharge planning process, led by the attending physician.  Recommendations may be updated based on patient status, additional functional criteria and insurance authorization.  Follow Up Recommendations No PT follow up      Assistance Recommended at Discharge PRN  Patient can return home with the following  Assistance with cooking/housework;Assist for transportation;Help with stairs or ramp for entrance    Equipment  Recommendations None recommended by PT  Recommendations for Other Services       Functional Status Assessment Patient has had a recent decline in their functional status and demonstrates the ability to make significant improvements in function in a reasonable and predictable amount of time.     Precautions / Restrictions Precautions Precautions: Fall Precaution Comments: watch SpO2 Restrictions Weight Bearing Restrictions: No      Mobility  Bed Mobility               General bed mobility comments: pt OOB in recliner at start and end of session    Transfers Overall transfer level: Needs assistance Equipment used: Rolling walker (2 wheels) Transfers: Sit to/from Stand Sit to Stand: Min guard           General transfer comment: minG, completed x7 in session. SpO2 to 86% on RA with x5 in a row    Ambulation/Gait Ambulation/Gait assistance: Min guard Gait Distance (Feet): 150 Feet Assistive device: Rolling walker (2 wheels) Gait Pattern/deviations: Step-through pattern, Decreased stride length Gait velocity: decreased Gait velocity interpretation: 1.31 - 2.62 ft/sec, indicative of limited community ambulator   General Gait Details: pt with slow but steady gait, heavy reliance on BUE support especially with fatigue. 3/4 DOE but recovers with 1-2 min seated rest.     Balance Overall balance assessment: Mild deficits observed, not formally tested  Pertinent Vitals/Pain Pain Assessment Pain Assessment: No/denies pain    Home Living Family/patient expects to be discharged to:: Private residence Living Arrangements: Spouse/significant other Available Help at Discharge: Family;Available 24 hours/day Type of Home: House Home Access: Ramped entrance       Home Layout: One level Home Equipment: Conservation officer, nature (2 wheels);Rollator (4 wheels);BSC/3in1;Shower seat;Grab bars - tub/shower      Prior  Function Prior Level of Function : Independent/Modified Independent             Mobility Comments: independent with use of RW in the home or rollator in community. uses an old WC around his shop so that he can "scoot around" more easily ADLs Comments: independent     Hand Dominance        Extremity/Trunk Assessment   Upper Extremity Assessment Upper Extremity Assessment: Overall WFL for tasks assessed    Lower Extremity Assessment Lower Extremity Assessment: LLE deficits/detail LLE Deficits / Details: chronic weakness, brace present. pt reports he broke his leg 2 years ago, ambulates with significant external rotation but this is baseline LLE Sensation: WNL LLE Coordination: WNL    Cervical / Trunk Assessment Cervical / Trunk Assessment: Other exceptions Cervical / Trunk Exceptions: significant body habitus  Communication   Communication: No difficulties  Cognition Arousal/Alertness: Awake/alert Behavior During Therapy: WFL for tasks assessed/performed Overall Cognitive Status: Within Functional Limits for tasks assessed                                 General Comments: pt eager to prove he is ready to return home, but agreeable to education and cues        General Comments General comments (skin integrity, edema, etc.): SpO2 to low of 86% on RA with repeated sit-stand transfers but recovered in ~20 sec to 90s with seated rest. encouraged energy conservation techniques and seated rest based on pt sx    Exercises Other Exercises Other Exercises: 5x sit-stand   Assessment/Plan    PT Assessment Patient needs continued PT services  PT Problem List Cardiopulmonary status limiting activity;Decreased strength;Decreased activity tolerance       PT Treatment Interventions DME instruction;Stair training;Gait training;Functional mobility training;Therapeutic activities;Therapeutic exercise;Balance training;Patient/family education    PT Goals (Current  goals can be found in the Care Plan section)  Acute Rehab PT Goals Patient Stated Goal: return home ASAP PT Goal Formulation: With patient/family Time For Goal Achievement: 01/17/22 Potential to Achieve Goals: Good    Frequency Min 3X/week        AM-PAC PT "6 Clicks" Mobility  Outcome Measure Help needed turning from your back to your side while in a flat bed without using bedrails?: A Little Help needed moving from lying on your back to sitting on the side of a flat bed without using bedrails?: A Little Help needed moving to and from a bed to a chair (including a wheelchair)?: A Little Help needed standing up from a chair using your arms (e.g., wheelchair or bedside chair)?: A Little Help needed to walk in hospital room?: A Little Help needed climbing 3-5 steps with a railing? : A Little 6 Click Score: 18    End of Session Equipment Utilized During Treatment: Gait belt Activity Tolerance: Patient tolerated treatment well;Patient limited by fatigue Patient left: in chair;with call bell/phone within reach;with family/visitor present Nurse Communication: Mobility status PT Visit Diagnosis: Other abnormalities of gait and mobility (R26.89);Muscle weakness (generalized) (M62.81)  Time: 2778-2423 PT Time Calculation (min) (ACUTE ONLY): 20 min   Charges:   PT Evaluation $PT Eval Low Complexity: 1 Low          West Carbo, PT, DPT   Acute Rehabilitation Department  Sandra Cockayne 01/03/2022, 5:24 PM

## 2022-01-04 DIAGNOSIS — J189 Pneumonia, unspecified organism: Secondary | ICD-10-CM | POA: Diagnosis not present

## 2022-01-04 LAB — CBC
HCT: 45.3 % (ref 39.0–52.0)
Hemoglobin: 14.8 g/dL (ref 13.0–17.0)
MCH: 29.7 pg (ref 26.0–34.0)
MCHC: 32.7 g/dL (ref 30.0–36.0)
MCV: 90.8 fL (ref 80.0–100.0)
Platelets: 211 10*3/uL (ref 150–400)
RBC: 4.99 MIL/uL (ref 4.22–5.81)
RDW: 14.2 % (ref 11.5–15.5)
WBC: 13.1 10*3/uL — ABNORMAL HIGH (ref 4.0–10.5)
nRBC: 0 % (ref 0.0–0.2)

## 2022-01-04 LAB — BASIC METABOLIC PANEL
Anion gap: 10 (ref 5–15)
BUN: 25 mg/dL — ABNORMAL HIGH (ref 8–23)
CO2: 29 mmol/L (ref 22–32)
Calcium: 8.8 mg/dL — ABNORMAL LOW (ref 8.9–10.3)
Chloride: 101 mmol/L (ref 98–111)
Creatinine, Ser: 0.87 mg/dL (ref 0.61–1.24)
GFR, Estimated: >60 mL/min (ref >60)
Glucose, Bld: 91 mg/dL (ref 70–99)
Potassium: 3.7 mmol/L (ref 3.5–5.1)
Sodium: 140 mmol/L (ref 135–145)

## 2022-01-04 LAB — LEGIONELLA PNEUMOPHILA SEROGP 1 UR AG: L. pneumophila Serogp 1 Ur Ag: NEGATIVE

## 2022-01-04 LAB — MAGNESIUM: Magnesium: 2 mg/dL (ref 1.7–2.4)

## 2022-01-04 MED ORDER — PREDNISONE 10 MG PO TABS: ORAL_TABLET | ORAL | 0 refills | Status: DC

## 2022-01-04 MED ORDER — ALBUTEROL SULFATE HFA 108 (90 BASE) MCG/ACT IN AERS: 2.0000 | INHALATION_SPRAY | RESPIRATORY_TRACT | 2 refills | Status: DC | PRN

## 2022-01-04 MED ORDER — ALBUTEROL SULFATE HFA 108 (90 BASE) MCG/ACT IN AERS: 2.0000 | INHALATION_SPRAY | Freq: Four times a day (QID) | RESPIRATORY_TRACT | 2 refills | Status: DC | PRN

## 2022-01-04 MED ORDER — AZITHROMYCIN 500 MG PO TABS: 500.0000 mg | ORAL_TABLET | Freq: Every day | ORAL | 0 refills | Status: AC

## 2022-01-04 MED ORDER — IPRATROPIUM-ALBUTEROL 0.5-2.5 (3) MG/3ML IN SOLN: 3.0000 mL | Freq: Two times a day (BID) | RESPIRATORY_TRACT | Status: DC

## 2022-01-04 NOTE — Discharge Summary (Signed)
Physician Discharge Summary  Gabriel Love KKX:[id number] DOB: 1940/08/24 DOA: 01/01/2022  PCP: Laurey Morale, MD  Admit date: 01/01/2022 Discharge date: 01/04/2022  Admitted From: home Disposition:  home  Recommendations for Outpatient Follow-up:  Follow up with PCP in 1-2 weeks Please obtain BMP/CBC in one week  Home Health: none Equipment/Devices: none  Discharge Condition: stable CODE STATUS: Full code   HPI: Per admitting MD, Gabriel Love is a 81 y.o. male with medical history significant of asthma, prior tobacco use, morbid obesity, hypertension, depression, chronic diastolic CHF, who comes to the hospital with difficulties breathing.  He has been feeling sick for the past week, with nasal and chest congestion.  His wife was sick before him and he believes he may have gotten this from her.  He denies any fever or chills.  He denies any sore throat.  He has no abdominal pain, nausea vomiting or diarrhea.  He has no chest pain.  Reports chronic lower extremity swelling which is at baseline.  He was seen in urgent care, sent home however due to persistent symptoms decided to come back to the ER.  Hospital Course / Discharge diagnoses: Principal Problem:   Multifocal pneumonia Active Problems:   Hyperlipidemia   Essential hypertension   Nonspecific abnormal finding in stool contents   Morbid obesity ([place])   Depression with anxiety   Grade I diastolic dysfunction   Asthma exacerbation   Principal problem Multifocal pneumonia, rhinovirus infection-patient was admitted to the hospital with multifocal pneumonia.  COVID and flu were negative however respiratory virus panel was positive for rhinovirus.  He was also placed on ceftriaxone and azithromycin.  He improved, wheezing has improved significantly, he is able to ambulate without difficulties and back to baseline.  He will be discharged home in stable condition with 3 more days of antibiotics and a steroid  taper.  Active problems Acute asthma exacerbation-due to #1, placed on nebulizers and steroids.  Wheezing is improved, he is back to baseline, steroid taper on discharge, his home albuterol was refilled.  Chronic diastolic CHF-most recent 2D echo in 2018 shows normal EF, grade 1 diastolic dysfunction.  Has chronic lower extremity swelling.  Has received couple doses of Lasix as adjunctive treatment for #1 #2.  Appears euvolemic on discharge, resume home medications  Essential hypertension-continue home medications  Hyperlipidemia-continue home statin Depression, anxiety-continue home medications Morbid obesity-BMI 47, he would benefit from weight loss  Sepsis ruled out   Discharge Instructions   Allergies as of 01/04/2022       Reactions   Ivp Dye [iodinated Contrast Media] Anaphylaxis   Iv dye allergy in 1985, anaphylaxix//a.calhoun- "shocked back to life"   Penicillins Other (See Comments)   Urticaria Has patient had a PCN reaction causing immediate rash, facial/tongue/throat swelling, SOB or lightheadedness with hypotension: no Has patient had a PCN reaction causing severe rash involving mucus membranes or skin necrosis: no Has patient had a PCN reaction that required hospitalization no Has patient had a PCN reaction occurring within the last 10 years: yes If all of the above answers are "NO", then may proceed with Cephalosporin use.   Lisinopril Cough        Medication List     TAKE these medications    albuterol 108 (90 Base) MCG/ACT inhaler Commonly known as: VENTOLIN HFA Inhale 2 puffs into the lungs every 6 (six) hours as needed for wheezing or shortness of breath. What changed: when to take this   aspirin EC  81 MG tablet Take 81 mg by mouth daily.   atorvastatin 40 MG tablet Commonly known as: LIPITOR TAKE 1 TABLET BY MOUTH DAILY   azithromycin 500 MG tablet Commonly known as: Zithromax Take 1 tablet (500 mg total) by mouth daily for 3 days. Take 1 tablet  daily for 3 days.   CENTRUM ADULTS PO Take 1 tablet by mouth daily.   furosemide 40 MG tablet Commonly known as: LASIX TAKE 1 TABLET(40 MG) BY MOUTH TWICE DAILY What changed: See the new instructions.   Lexapro 20 MG tablet Generic drug: escitalopram TAKE 1 TABLET(20 MG) BY MOUTH DAILY   metoprolol succinate 50 MG 24 hr tablet Commonly known as: TOPROL-XL TAKE 1 TABLET BY MOUTH EVERY DAY   mupirocin cream 2 % Commonly known as: Bactroban Apply 1 application topically 2 (two) times daily. What changed:  when to take this reasons to take this   potassium chloride SA 20 MEQ tablet Commonly known as: KLOR-CON M TAKE 1 TABLET(20 MEQ) BY MOUTH DAILY What changed: See the new instructions.   predniSONE 10 MG tablet Commonly known as: DELTASONE Take 4 tablets (40 mg total) by mouth daily for 1 day, THEN 3 tablets (30 mg total) daily for 2 days, THEN 2 tablets (20 mg total) daily for 2 days, THEN 1 tablet (10 mg total) daily for 2 days. Start taking on: January 04, 2022 What changed:  medication strength See the new instructions.   VITAMIN C PO Take 1 tablet by mouth daily.   VITAMIN D3 PO Take 1 tablet by mouth daily at 12 noon.   VITAMIN E PO Take 1 capsule by mouth daily at 12 noon.        Consultations: none  Procedures/Studies:  CT Angio Chest PE W and/or Wo Contrast  Result Date: 01/02/2022 CLINICAL DATA:  Pulmonary embolism. Shortness of breath with cough and wheezing EXAM: CT ANGIOGRAPHY CHEST WITH CONTRAST TECHNIQUE: Multidetector CT imaging of the chest was performed using the standard protocol during bolus administration of intravenous contrast. Multiplanar CT image reconstructions and MIPs were obtained to evaluate the vascular anatomy. RADIATION DOSE REDUCTION: This exam was performed according to the departmental dose-optimization program which includes automated exposure control, adjustment of the mA and/or kV according to patient size and/or use of  iterative reconstruction technique. CONTRAST:  56m OMNIPAQUE IOHEXOL 350 MG/ML SOLN COMPARISON:  06/27/2013 FINDINGS: Cardiovascular: Satisfactory opacification of the pulmonary arteries to the segmental level. No evidence of pulmonary embolism when accounting for motion artifact which especially affects segmental and subsegmental branches at the bases. Normal heart size. No pericardial effusion. Atheromatous calcification of the aorta and coronaries Mediastinum/Nodes: Negative for adenopathy or mass. Lungs/Pleura: Generalized airway thickening with patchy air trapping. Peripheral airway opacifications. Small area of nodular airspace opacity in the right upper lobe on 7:84 measuring 13 mm. Area of solid and sub solid opacity in the right middle lobe on 7:237. There are a few areas of patchy opacity in the left lung. No edema, effusion, or pneumothorax. Upper Abdomen: No acute finding Musculoskeletal: Generalized spondylosis with multi-level bridging osteophyte. Severe bilateral shoulder degeneration. Review of the MIP images confirms the above findings. IMPRESSION: 1. Small areas of bilateral airspace disease, most likely early multifocal pneumonia. There is a background of generalized airway thickening and patchy air trapping. Some of the opacities have nodular features and follow-up is recommended after treatment; will need CT follow-up given these areas are small and occult by radiography. 2. No evidence of pulmonary embolism when allowing  for motion artifact. 3. Atherosclerosis including the coronary arteries. Electronically Signed   By: Jorje Guild M.D.   On: 01/02/2022 08:29   DG Chest 2 View  Result Date: 01/01/2022 CLINICAL DATA:  Shortness of breath EXAM: CHEST - 2 VIEW COMPARISON:  12/28/2021 FINDINGS: Normal cardiac and mediastinal contours given AP technique. No focal pulmonary opacity. No pleural effusion. No pneumothorax. No acute osseous abnormality. IMPRESSION: No active cardiopulmonary  disease. Electronically Signed   By: Merilyn Baba M.D.   On: 01/01/2022 12:48   DG Chest 1 View  Result Date: 12/28/2021 CLINICAL DATA:  Cough with shortness of breath for 2 days. EXAM: CHEST  1 VIEW COMPARISON:  02/21/21 FINDINGS: Limitations: Bilateral costophrenic angles are excluded from the field of view. Within this limitation, no pleural effusion. No pneumothorax. No focal airspace opacity. There is slight prominence of the interstitium. Normal cardiac and mediastinal contours. No displaced rib fractures. Mild degenerative changes of the left AC joint. The visualized upper abdomen is unremarkable. IMPRESSION: Limitation: Bilateral costophrenic angles are excluded from the field of view. Within this limitation- 1. No focal airspace opacity. 2. Slightly prominent interstitial opacities, nonspecific, but could be seen with bronchitis / small airways disease. Electronically Signed   By: Marin Roberts M.D.   On: 12/28/2021 09:18     Subjective: - no chest pain, shortness of breath, no abdominal pain, nausea or vomiting.   Discharge Exam: BP (!) 152/60 (BP Location: Right Arm)   Pulse 60   Temp 98.1 F (36.7 C) (Oral)   Resp 18   Ht '5\' 11"'$  (1.803 m)   Wt (!) 154.2 kg   SpO2 95%   BMI 47.42 kg/m   General: Pt is alert, awake, not in acute distress Cardiovascular: RRR, S1/S2 +, no rubs, no gallops Respiratory: CTA bilaterally, no wheezing, no rhonchi Abdominal: Soft, NT, ND, bowel sounds + Extremities: no edema, no cyanosis    The results of significant diagnostics from this hospitalization (including imaging, microbiology, ancillary and laboratory) are listed below for reference.     Microbiology: Recent Results (from the past 240 hour(s))  Resp Panel by RT-PCR (Flu A&B, Covid) Anterior Nasal Swab     Status: None   Collection Time: 12/28/21  9:39 AM   Specimen: Anterior Nasal Swab  Result Value Ref Range Status   SARS Coronavirus 2 by RT PCR NEGATIVE NEGATIVE Final     Comment: (NOTE) SARS-CoV-2 target nucleic acids are NOT DETECTED.  The SARS-CoV-2 RNA is generally detectable in upper respiratory specimens during the acute phase of infection. The lowest concentration of SARS-CoV-2 viral copies this assay can detect is 138 copies/mL. A negative result does not preclude SARS-Cov-2 infection and should not be used as the sole basis for treatment or other patient management decisions. A negative result may occur with  improper specimen collection/handling, submission of specimen other than nasopharyngeal swab, presence of viral mutation(s) within the areas targeted by this assay, and inadequate number of viral copies(<138 copies/mL). A negative result must be combined with clinical observations, patient history, and epidemiological information. The expected result is Negative.  Fact Sheet for Patients:  EntrepreneurPulse.com.au  Fact Sheet for Healthcare Providers:  IncredibleEmployment.be  This test is no t yet approved or cleared by the [place] FDA and  has been authorized for detection and/or diagnosis of SARS-CoV-2 by FDA under an Emergency Use Authorization (EUA). This EUA will remain  in effect (meaning this test can be used) for the duration of the COVID-19 declaration  under Section 564(b)(1) of the Act, 21 U.S.C.section 360bbb-3(b)(1), unless the authorization is terminated  or revoked sooner.       Influenza A by PCR NEGATIVE NEGATIVE Final   Influenza B by PCR NEGATIVE NEGATIVE Final    Comment: (NOTE) The Xpert Xpress SARS-CoV-2/FLU/RSV plus assay is intended as an aid in the diagnosis of influenza from Nasopharyngeal swab specimens and should not be used as a sole basis for treatment. Nasal washings and aspirates are unacceptable for Xpert Xpress SARS-CoV-2/FLU/RSV testing.  Fact Sheet for Patients: EntrepreneurPulse.com.au  Fact Sheet for Healthcare  Providers: IncredibleEmployment.be  This test is not yet approved or cleared by the [place] FDA and has been authorized for detection and/or diagnosis of SARS-CoV-2 by FDA under an Emergency Use Authorization (EUA). This EUA will remain in effect (meaning this test can be used) for the duration of the COVID-19 declaration under Section 564(b)(1) of the Act, 21 U.S.C. section 360bbb-3(b)(1), unless the authorization is terminated or revoked.  Performed at [facility], [place] [address] [place], [place] [address]   Resp Panel by RT-PCR (Flu A&B, Covid) Anterior Nasal Swab     Status: None   Collection Time: 01/01/22 12:24 PM   Specimen: Anterior Nasal Swab  Result Value Ref Range Status   SARS Coronavirus 2 by RT PCR NEGATIVE NEGATIVE Final    Comment: (NOTE) SARS-CoV-2 target nucleic acids are NOT DETECTED.  The SARS-CoV-2 RNA is generally detectable in upper respiratory specimens during the acute phase of infection. The lowest concentration of SARS-CoV-2 viral copies this assay can detect is 138 copies/mL. A negative result does not preclude SARS-Cov-2 infection and should not be used as the sole basis for treatment or other patient management decisions. A negative result may occur with  improper specimen collection/handling, submission of specimen other than nasopharyngeal swab, presence of viral mutation(s) within the areas targeted by this assay, and inadequate number of viral copies(<138 copies/mL). A negative result must be combined with clinical observations, patient history, and epidemiological information. The expected result is Negative.  Fact Sheet for Patients:  EntrepreneurPulse.com.au  Fact Sheet for Healthcare Providers:  IncredibleEmployment.be  This test is no t yet approved or cleared by the [place] FDA and  has been authorized for detection and/or diagnosis of SARS-CoV-2 by FDA under an  Emergency Use Authorization (EUA). This EUA will remain  in effect (meaning this test can be used) for the duration of the COVID-19 declaration under Section 564(b)(1) of the Act, 21 U.S.C.section 360bbb-3(b)(1), unless the authorization is terminated  or revoked sooner.       Influenza A by PCR NEGATIVE NEGATIVE Final   Influenza B by PCR NEGATIVE NEGATIVE Final    Comment: (NOTE) The Xpert Xpress SARS-CoV-2/FLU/RSV plus assay is intended as an aid in the diagnosis of influenza from Nasopharyngeal swab specimens and should not be used as a sole basis for treatment. Nasal washings and aspirates are unacceptable for Xpert Xpress SARS-CoV-2/FLU/RSV testing.  Fact Sheet for Patients: EntrepreneurPulse.com.au  Fact Sheet for Healthcare Providers: IncredibleEmployment.be  This test is not yet approved or cleared by the [place] FDA and has been authorized for detection and/or diagnosis of SARS-CoV-2 by FDA under an Emergency Use Authorization (EUA). This EUA will remain in effect (meaning this test can be used) for the duration of the COVID-19 declaration under Section 564(b)(1) of the Act, 21 U.S.C. section 360bbb-3(b)(1), unless the authorization is terminated or revoked.  Performed at [facility], [place]  [address] [place], [place] [address]   Respiratory (~20 pathogens) panel by PCR     Status: Abnormal   Collection Time: 01/02/22  9:14 AM   Specimen: Nasopharyngeal Swab; Respiratory  Result Value Ref Range Status   Adenovirus NOT DETECTED NOT DETECTED Final   Coronavirus 229E NOT DETECTED NOT DETECTED Final    Comment: (NOTE) The Coronavirus on the Respiratory Panel, DOES NOT test for the novel  Coronavirus (2019 nCoV)    Coronavirus HKU1 NOT DETECTED NOT DETECTED Final   Coronavirus NL63 NOT DETECTED NOT DETECTED Final   Coronavirus OC43 NOT DETECTED NOT DETECTED Final   Metapneumovirus NOT DETECTED NOT DETECTED Final    Rhinovirus / Enterovirus DETECTED (A) NOT DETECTED Final   Influenza A NOT DETECTED NOT DETECTED Final   Influenza B NOT DETECTED NOT DETECTED Final   Parainfluenza Virus 1 NOT DETECTED NOT DETECTED Final   Parainfluenza Virus 2 NOT DETECTED NOT DETECTED Final   Parainfluenza Virus 3 NOT DETECTED NOT DETECTED Final   Parainfluenza Virus 4 NOT DETECTED NOT DETECTED Final   Respiratory Syncytial Virus NOT DETECTED NOT DETECTED Final   Bordetella pertussis NOT DETECTED NOT DETECTED Final   Bordetella Parapertussis NOT DETECTED NOT DETECTED Final   Chlamydophila pneumoniae NOT DETECTED NOT DETECTED Final   Mycoplasma pneumoniae NOT DETECTED NOT DETECTED Final    Comment: Performed at [facility], [address] [place], [place] [address]     Labs: Basic Metabolic Panel: Recent Labs  Lab 01/01/22 1232 01/03/22 0718 01/04/22 0503  NA 142 140 140  K 4.2 3.5 3.7  CL 104 102 101  CO2 '30 29 29  '$ GLUCOSE 132* 78 91  BUN 20 18 25*  CREATININE 0.96 0.71 0.87  CALCIUM 8.5* 8.1* 8.8*  MG  --   --  2.0   Liver Function Tests: Recent Labs  Lab 01/01/22 1232  AST 24  ALT 26  ALKPHOS 72  BILITOT 0.8  PROT 6.5  ALBUMIN 3.4*   CBC: Recent Labs  Lab 01/01/22 1232 01/04/22 0503  WBC 13.0* 13.1*  NEUTROABS 10.5*  --   HGB 15.5 14.8  HCT 48.4 45.3  MCV 93.4 90.8  PLT 260 211   CBG: No results for input(s): "GLUCAP" in the last 168 hours. Hgb A1c No results for input(s): "HGBA1C" in the last 72 hours. Lipid Profile No results for input(s): "CHOL", "HDL", "LDLCALC", "TRIG", "CHOLHDL", "LDLDIRECT" in the last 72 hours. Thyroid function studies No results for input(s): "TSH", "T4TOTAL", "T3FREE", "THYROIDAB" in the last 72 hours.  Invalid input(s): "FREET3" Urinalysis    Component Value Date/Time   COLORURINE YELLOW 05/08/2020 1614   APPEARANCEUR HAZY (A) 05/08/2020 1614   LABSPEC 1.015 05/08/2020 1614   PHURINE 5.0 05/08/2020 1614   GLUCOSEU NEGATIVE 05/08/2020 1614    GLUCOSEU NEGATIVE 07/05/2006 0824   HGBUR NEGATIVE 05/08/2020 1614   [place] 05/08/2020 1614   BILIRUBINUR neg 02/26/2016 1108   KETONESUR NEGATIVE 05/08/2020 1614   PROTEINUR 30 (A) 05/08/2020 1614   UROBILINOGEN 0.2 02/26/2016 1108   UROBILINOGEN 1.0 12/24/2012 1307   NITRITE NEGATIVE 05/08/2020 1614   LEUKOCYTESUR LARGE (A) 05/08/2020 1614    FURTHER DISCHARGE INSTRUCTIONS:   Get Medicines reviewed and adjusted: Please take all your medications with you for your next visit with your Primary MD   Laboratory/radiological data: Please request your Primary MD to go over all hospital tests and procedure/radiological results at the follow up, please ask your Primary MD to get all Hospital records  sent to his/her office.   In some cases, they will be blood work, cultures and biopsy results pending at the time of your discharge. Please request that your primary care M.D. goes through all the records of your hospital data and follows up on these results.   Also Note the following: If you experience worsening of your admission symptoms, develop shortness of breath, life threatening emergency, suicidal or homicidal thoughts you must seek medical attention immediately by calling 911 or calling your MD immediately  if symptoms less severe.   You must read complete instructions/literature along with all the possible adverse reactions/side effects for all the Medicines you take and that have been prescribed to you. Take any new Medicines after you have completely understood and accpet all the possible adverse reactions/side effects.    Do not drive when taking Pain medications or sleeping medications (Benzodaizepines)   Do not take more than prescribed Pain, Sleep and Anxiety Medications. It is not advisable to combine anxiety,sleep and pain medications without talking with your primary care practitioner   Special Instructions: If you have smoked or chewed Tobacco  in the last 2  yrs please stop smoking, stop any regular Alcohol  and or any Recreational drug use.   Wear Seat belts while driving.   Please note: You were cared for by a hospitalist during your hospital stay. Once you are discharged, your primary care physician will handle any further medical issues. Please note that NO REFILLS for any discharge medications will be authorized once you are discharged, as it is imperative that you return to your primary care physician (or establish a relationship with a primary care physician if you do not have one) for your post hospital discharge needs so that they can reassess your need for medications and monitor your lab values.  Time coordinating discharge: 35 minutes  SIGNED:  Marzetta Board, MD, PhD 01/04/2022, 7:46 AM

## 2022-01-05 ENCOUNTER — Telehealth: Payer: Self-pay

## 2022-01-05 NOTE — Telephone Encounter (Signed)
Per wife  Transition Care Management Follow-up Telephone Call Date of discharge and from where: 01/04/2022 Gabriel Love How have you been since you were released from the hospital? Still congested Any questions or concerns? No  Items Reviewed: Did the pt receive and understand the discharge instructions provided? Yes  Medications obtained and verified? Yes  Other? No  Any new allergies since your discharge? No  Dietary orders reviewed? Yes Do you have support at home? Yes   Home Care and Equipment/Supplies: Were home health services ordered? not applicable If so, what is the name of the agency? N/a  Has the agency set up a time to come to the patient's home? not applicable Were any new equipment or medical supplies ordered?  No What is the name of the medical supply agency? N/a Were you able to get the supplies/equipment? not applicable Do you have any questions related to the use of the equipment or supplies? No  Functional Questionnaire: (I = Independent and D = Dependent) ADLs: I  Bathing/Dressing- I  Meal Prep- I  Eating- I  Maintaining continence- I  Transferring/Ambulation- I  Managing Meds- I  Follow up appointments reviewed:  PCP Hospital f/u appt confirmed? Yes  Scheduled to see Dr. Sarajane Jews on 01/11/2022 @ 1:00. Are transportation arrangements needed? No  If their condition worsens, is the pt aware to call PCP or go to the Emergency Dept.? Yes Was the patient provided with contact information for the PCP's office or ED? Yes Was to pt encouraged to call back with questions or concerns? Yes

## 2022-01-06 ENCOUNTER — Ambulatory Visit: Payer: Medicare PPO | Admitting: Podiatry

## 2022-01-06 ENCOUNTER — Encounter: Payer: Self-pay | Admitting: Podiatry

## 2022-01-06 DIAGNOSIS — M79674 Pain in right toe(s): Secondary | ICD-10-CM

## 2022-01-06 DIAGNOSIS — I739 Peripheral vascular disease, unspecified: Secondary | ICD-10-CM | POA: Diagnosis not present

## 2022-01-06 DIAGNOSIS — M79675 Pain in left toe(s): Secondary | ICD-10-CM

## 2022-01-06 DIAGNOSIS — B351 Tinea unguium: Secondary | ICD-10-CM | POA: Diagnosis not present

## 2022-01-06 NOTE — Progress Notes (Signed)
  Subjective:  Patient ID: Gabriel Love, male    DOB: May 29, 1940,  MRN: [id number]  Gabriel Love presents to clinic today for:  Chief Complaint  Patient presents with   Nail Problem    Routine foot care PCP-Gabriel Love PCP VST-Gabriel Love    New problem(s): None.   Accompanied by his wife on today's visit. Patient has been hospitalized for pneumonia recently. Also has remote episode of LE cellulitis b/l in August and was treated with antibiotics.  PCP is Gabriel Morale, MD , and last visit was  March 31, 2021.  Allergies  Allergen Reactions   Ivp Dye [Iodinated Contrast Media] Anaphylaxis    Iv dye allergy in 1985, anaphylaxix//a.calhoun- "shocked back to life"   Penicillins Other (See Comments)    Urticaria Has patient had a PCN reaction causing immediate rash, facial/tongue/throat swelling, SOB or lightheadedness with hypotension: no Has patient had a PCN reaction causing severe rash involving mucus membranes or skin necrosis: no Has patient had a PCN reaction that required hospitalization no Has patient had a PCN reaction occurring within the last 10 years: yes If all of the above answers are "NO", then may proceed with Cephalosporin use.    Lisinopril Cough    Review of Systems: Negative except as noted in the HPI.  Objective: No changes noted in today's physical examination.  Gabriel Love is a pleasant 81 y.o. male morbidly obese in NAD. AAO x 3.  Neurovascular Examination: CFT <3 seconds b/l LE. Faintly palpable DP pulses b/l LE. Diminished PT pulse(s) b/l LE. Pedal hair absent. No pain with calf compression b/l. Lower extremity skin temperature gradient within normal limits. Nonpitting edema noted BLE. Evidence of chronic venous insufficiency b/l LE. No cyanosis or clubbing noted b/l LE. Wearing compression hose b/l LE.  Protective sensation intact 5/5 intact bilaterally with 10g monofilament b/l. Vibratory sensation intact b/l.  Dermatological:  Pedal skin  thin and atrophic b/l LE. No open wounds b/l LE. No interdigital macerations noted b/l LE. Toenails 1-5 b/l elongated, discolored, dystrophic, thickened, crumbly with subungual debris and tenderness to dorsal palpation. No hyperkeratotic nor porokeratotic lesions present on today's visit.  Musculoskeletal:  Normal muscle strength 5/5 to RLE. Dropfoot LLE. Pes planus deformity noted bilateral LE.Marland Kitchen No pain, crepitus or joint limitation noted with ROM b/l LE.  Wearing shoe with brace LLE.  Assessment/Plan: 1. Pain due to onychomycosis of toenails of both feet   2. Peripheral vascular disease with claudication ([place])     No orders of the defined types were placed in this encounter.   -Patient's family member present. All questions/concerns addressed on today's visit. -No new findings. No new orders. -Continue compression hose daily for edema control. -Patient to continue soft, supportive shoe gear daily. -Mycotic toenails 1-5 bilaterally were debrided in length and girth with sterile nail nippers and dremel without incident. -Patient/POA to call should there be question/concern in the interim.   Return in about 3 months (around 04/08/2022).  Marzetta Board, DPM

## 2022-01-11 ENCOUNTER — Ambulatory Visit: Payer: Medicare PPO | Admitting: Family Medicine

## 2022-01-11 ENCOUNTER — Encounter: Payer: Self-pay | Admitting: Family Medicine

## 2022-01-11 VITALS — BP 124/78 | HR 67 | Temp 97.7°F | Wt 329.0 lb

## 2022-01-11 DIAGNOSIS — J189 Pneumonia, unspecified organism: Secondary | ICD-10-CM | POA: Diagnosis not present

## 2022-01-11 DIAGNOSIS — R600 Localized edema: Secondary | ICD-10-CM

## 2022-01-11 DIAGNOSIS — J45901 Unspecified asthma with (acute) exacerbation: Secondary | ICD-10-CM

## 2022-01-11 DIAGNOSIS — I10 Essential (primary) hypertension: Secondary | ICD-10-CM

## 2022-01-11 MED ORDER — LEXAPRO 20 MG PO TABS: ORAL_TABLET | ORAL | 3 refills | Status: DC

## 2022-01-11 NOTE — Progress Notes (Signed)
   Subjective:    Patient ID: Gabriel Love, male    DOB: [birth year], 81 y.o.   MRN: [id number]  HPI  Here with his wife for a transitional care follow up after a hospital stay from 01-01-22 to 01-04-22 for a multifocal pneumonia due to a rhinovirus infection. He presented with a cough and with dyspnea. A CT angiogram of the chest was negative for PE., but it showed he had a multifocal pneumonia. His WBC was 13.1, and the creatinine was 0.87. He was treated with IV Ceftriaxone and Azithromycin, and sent home with some Azithromycin and a steroid taper. Since going home he has done quite well. His appetite is good, and he is getting his strength back. He denies any SOB or cough. He has not used his inhaler at all since the DC.    Review of Systems  Constitutional: Negative.   Respiratory: Negative.    Cardiovascular: Negative.   Gastrointestinal: Negative.   Genitourinary: Negative.   Neurological: Negative.        Objective:   Physical Exam Constitutional:      General: He is not in acute distress.    Comments: Walks with a walker   Cardiovascular:     Rate and Rhythm: Normal rate and regular rhythm.     Pulses: Normal pulses.     Heart sounds: Normal heart sounds.  Pulmonary:     Effort: Pulmonary effort is normal.     Breath sounds: Normal breath sounds.  Musculoskeletal:     Comments: 2+ edema in both lower legs   Neurological:     Mental Status: He is alert.           Assessment & Plan:  He is recovering well from a viral pneumonia. He seems to be back to his baseline. He will recheck as needed.  Alysia Penna, MD

## 2022-01-23 ENCOUNTER — Other Ambulatory Visit: Payer: Self-pay | Admitting: Family Medicine

## 2022-02-20 ENCOUNTER — Other Ambulatory Visit: Payer: Self-pay | Admitting: Family Medicine

## 2022-03-05 ENCOUNTER — Other Ambulatory Visit: Payer: Self-pay | Admitting: Family Medicine

## 2022-03-05 DIAGNOSIS — E785 Hyperlipidemia, unspecified: Secondary | ICD-10-CM

## 2022-03-10 ENCOUNTER — Encounter: Payer: Self-pay | Admitting: Family Medicine

## 2022-03-10 ENCOUNTER — Ambulatory Visit: Payer: Medicare PPO | Admitting: Family Medicine

## 2022-03-10 VITALS — BP 120/70 | HR 65 | Temp 97.7°F | Wt 330.0 lb

## 2022-03-10 DIAGNOSIS — I10 Essential (primary) hypertension: Secondary | ICD-10-CM | POA: Diagnosis not present

## 2022-03-10 DIAGNOSIS — R600 Localized edema: Secondary | ICD-10-CM

## 2022-03-10 DIAGNOSIS — F418 Other specified anxiety disorders: Secondary | ICD-10-CM | POA: Diagnosis not present

## 2022-03-10 DIAGNOSIS — M159 Polyosteoarthritis, unspecified: Secondary | ICD-10-CM

## 2022-03-10 MED ORDER — LEXAPRO 20 MG PO TABS: ORAL_TABLET | ORAL | 3 refills | Status: DC

## 2022-03-10 MED ORDER — BUPROPION HCL ER (XL) 150 MG PO TB24: 150.0000 mg | ORAL_TABLET | Freq: Every day | ORAL | 2 refills | Status: DC

## 2022-03-10 MED ORDER — MELOXICAM 15 MG PO TABS: 15.0000 mg | ORAL_TABLET | Freq: Every day | ORAL | 3 refills | Status: DC

## 2022-03-10 NOTE — Progress Notes (Signed)
   Subjective:    Patient ID: Gabriel Love, male    DOB: May 07, 1940, 81 y.o.   MRN: [id number]  HPI Here for several issues. First he has no cough or SOB, so he clearly has recovered from the multifocal pneumonia he had in October. Today his wife is concerned abou this depression. The Lexapro seemed to help for awhile, but lately it does not seem to be helping as much. He often sits for hours without doing or saying anything, and he has frequent crying spells. He's till denies any suicidal thoughts. He does not sleep well. Also he has arthritis pains throughout his body, especially in the shoulders, hips, and knees. Tylenol and Ibuprofen do not help much. His leg swelling seems to be stable.    Review of Systems  Constitutional: Negative.   Respiratory: Negative.    Cardiovascular:  Positive for leg swelling. Negative for chest pain and palpitations.  Musculoskeletal:  Positive for arthralgias.  Psychiatric/Behavioral:  Positive for dysphoric mood and sleep disturbance. Negative for agitation, behavioral problems, confusion, decreased concentration, hallucinations, self-injury and suicidal ideas. The patient is not nervous/anxious.        Objective:   Physical Exam Constitutional:      Appearance: He is obese.     Comments: Using a rolling walker   Cardiovascular:     Rate and Rhythm: Normal rate and regular rhythm.     Pulses: Normal pulses.     Heart sounds: Normal heart sounds.  Pulmonary:     Effort: Pulmonary effort is normal.     Breath sounds: Normal breath sounds.  Musculoskeletal:     Comments: 4+ edema in both lower legs   Neurological:     Mental Status: He is alert and oriented to person, place, and time. Mental status is at baseline.  Psychiatric:        Mood and Affect: Mood normal.        Behavior: Behavior normal.        Thought Content: Thought content normal.           Assessment & Plan:  For the depression, we will add Wellbutrin XL 150 mg daily to the  Lexapro. For the OA, he will try Meloxicam 15 mg daily. Follow up on both issues in 3-4 weeks.  Alysia Penna, MD

## 2022-03-17 ENCOUNTER — Other Ambulatory Visit: Payer: Self-pay | Admitting: Family Medicine

## 2022-03-17 NOTE — Telephone Encounter (Addendum)
Last OV-03/10/22 Last refills-12/09/21-90 tabs, 0 refills  No future OV scheduled.

## 2022-04-12 ENCOUNTER — Encounter: Payer: Self-pay | Admitting: Podiatry

## 2022-04-17 ENCOUNTER — Ambulatory Visit
Admission: EM | Admit: 2022-04-17 | Discharge: 2022-04-17 | Disposition: A | Discharge status: Home / Self Care | Payer: Medicare PPO | Attending: Family Medicine | Admitting: Family Medicine

## 2022-04-17 ENCOUNTER — Other Ambulatory Visit: Payer: Self-pay

## 2022-04-17 ENCOUNTER — Ambulatory Visit: Payer: Medicare PPO

## 2022-04-17 ENCOUNTER — Encounter: Payer: Self-pay | Admitting: Emergency Medicine

## 2022-04-17 ENCOUNTER — Ambulatory Visit (INDEPENDENT_AMBULATORY_CARE_PROVIDER_SITE_OTHER): Payer: Medicare PPO

## 2022-04-17 DIAGNOSIS — J22 Unspecified acute lower respiratory infection: Secondary | ICD-10-CM

## 2022-04-17 DIAGNOSIS — Z8701 Personal history of pneumonia (recurrent): Secondary | ICD-10-CM

## 2022-04-17 DIAGNOSIS — R059 Cough, unspecified: Secondary | ICD-10-CM

## 2022-04-17 DIAGNOSIS — R000 Tachycardia, unspecified: Secondary | ICD-10-CM | POA: Diagnosis not present

## 2022-04-17 DIAGNOSIS — R062 Wheezing: Secondary | ICD-10-CM

## 2022-04-17 MED ORDER — AZITHROMYCIN 250 MG PO TABS: 250.0000 mg | ORAL_TABLET | Freq: Every day | ORAL | 0 refills | Status: DC

## 2022-04-17 MED ORDER — DEXAMETHASONE SODIUM PHOSPHATE 10 MG/ML IJ SOLN
10.0000 mg | Freq: Once | INTRAMUSCULAR | Status: AC
  Administered 2022-04-17: 10 mg via INTRAMUSCULAR

## 2022-04-17 MED ORDER — PROMETHAZINE-DM 6.25-15 MG/5ML PO SYRP: 5.0000 mL | ORAL_SOLUTION | Freq: Four times a day (QID) | ORAL | 0 refills | Status: DC | PRN

## 2022-04-17 MED ORDER — GUAIFENESIN ER 600 MG PO TB12: 600.0000 mg | ORAL_TABLET | Freq: Two times a day (BID) | ORAL | 0 refills | Status: DC

## 2022-04-17 NOTE — ED Triage Notes (Signed)
Pt reports cough, and chest congestion for last several weeks. Pt reports "I can't seem to get anything up." Reports history of pneumonia. Denies fever.

## 2022-04-21 NOTE — ED Provider Notes (Signed)
[facility]    CSN: [id number] Arrival date & time: 04/17/22  1348      History   Chief Complaint Chief Complaint  Patient presents with   Cough    HPI Gabriel Love is a 82 y.o. male.   Patient presenting today with several week history of productive cough, chest congestion and now some shortness of breath, weakness.  Denies fever, chills, congestion, sore throat, body aches, chest pain, abdominal pain, nausea vomiting or diarrhea.  Has been taking over-the-counter remedies with minimal relief.  History of pneumonia, asthma currently on albuterol as needed which is helping minimally.    Past Medical History:  Diagnosis Date   Abdominal hernia    Allergy    Blood transfusion without reported diagnosis 12/2006   At [facility] with hip replacement - 2 units transfused   Cancer ([place])    skin cancers   Cataract    Depression    DJD (degenerative joint disease)    shoulders bilateral   Environmental allergies    Full dentures    Grade I diastolic dysfunction 0/12/710   Hearing loss    bilateral hearing aids   History of colonic polyps    History of urinary tract infection    Hyperlipidemia    under control   Hypertension    "borderline"   Nonspecific abnormal finding in stool contents    Other general symptoms(780.99)    Prostate hypertrophy    sees Dr. Gaynelle Arabian    Urinary leakage     Patient Active Problem List   Diagnosis Date Noted   Multifocal pneumonia 01/02/2022   Asthma exacerbation 01/02/2022   Acute gastroenteritis 05/09/2020   Sepsis secondary to UTI ([place]) 05/08/2020   Grade I diastolic dysfunction 19/75/8832   Class 1 obesity 05/08/2020   Hyponatremia 05/08/2020   Hypokalemia 05/08/2020   Hypoalbuminemia 05/08/2020   Mild protein malnutrition ([place]) 05/08/2020   Depression with anxiety 07/24/2019   Morbid obesity (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   Nonspecific abnormal finding in stool contents 05/29/2013   Atherosclerotic peripheral vascular disease with intermittent claudication ([place]) 06/14/2012   Spinal stenosis of lumbar region 06/14/2012   DEGENERATIVE DISC DISEASE, 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   Hyperlipidemia 11/23/2006   Essential hypertension 11/23/2006   HEMATURIA 11/23/2006   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    Past Surgical History:  Procedure Laterality Date   CATARACT EXTRACTION W/PHACO Right 03/25/2014   Procedure: CATARACT EXTRACTION PHACO AND INTRAOCULAR LENS PLACEMENT; CDE:9.76;  Surgeon: Williams Che, MD;  Location: [facility];  Service: Ophthalmology;  Laterality: Right;   CATARACT EXTRACTION W/PHACO Left 06/24/2014   Procedure: CATARACT EXTRACTION PHACO AND INTRAOCULAR LENS PLACEMENT (IOC);  Surgeon: Williams Che, MD;  Location: [facility];  Service: Ophthalmology;  Laterality: Left;  CDE:8.45   CIRCUMCISION N/A 03/26/2016   Procedure: CIRCUMCISION ADULT;  Surgeon: Carolan Clines, MD;  Location: [facility];  Service: Urology;  Laterality: N/ADenton Meek, [name]   COLONOSCOPY  06-19-13, 03/05/15   per Dr. 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 Percell Miller    LAPAROSCOPIC PARTIAL COLECTOMY N/A 08/07/2013   Procedure: LAPAROSCOPIC right PARTIAL COLECTOMY;  Surgeon: Leighton Ruff, MD;  Location: [facility];  Service: General;  Laterality: N/A;   MULTIPLE TOOTH EXTRACTIONS     PROSTATE SURGERY  03-29-11   had CTT per Dr. Gaynelle Arabian, in office       Home Medications    Prior to  Admission medications   Medication Sig Start Date End Date Taking? Authorizing Provider  azithromycin (ZITHROMAX) 250 MG tablet Take 1 tablet (250 mg total) by mouth daily. Take first 2 tablets together, then 1 every day until finished. 04/17/22  Yes Volney American, PA-C  guaiFENesin (MUCINEX) 600 MG 12 hr tablet Take 1 tablet (600 mg total) by mouth 2 (two) times daily. 04/17/22  Yes Volney American, PA-C  promethazine-dextromethorphan (PROMETHAZINE-DM) 6.25-15 MG/5ML syrup Take 5 mLs by mouth 4 (four) times daily as needed. 04/17/22  Yes Volney American, PA-C  albuterol (VENTOLIN HFA) 108 (90 Base) MCG/ACT inhaler Inhale 2 puffs into the lungs every 6 (six) hours as needed for wheezing or shortness of breath. 01/04/22   Caren Griffins, MD  Ascorbic Acid (VITAMIN C PO) Take 1 tablet by mouth daily.    [provider]  aspirin EC 81 MG tablet Take 81 mg by mouth daily.    [provider]  atorvastatin (LIPITOR) 40 MG tablet TAKE 1 TABLET BY MOUTH DAILY 03/05/22   Laurey Morale, MD  buPROPion (WELLBUTRIN XL) 150 MG 24 hr tablet Take 1 tablet (150 mg total) by mouth daily. 03/10/22   Laurey Morale, MD  Cholecalciferol (VITAMIN D3 PO) Take 1 tablet by mouth daily at 12 noon.    [provider]  furosemide (LASIX) 40 MG tablet TAKE 1 TABLET(40 MG) BY MOUTH TWICE DAILY 03/17/22   Laurey Morale, MD  LEXAPRO 20 MG tablet TAKE 1 TABLET(20 MG) BY MOUTH DAILY 03/10/22   Laurey Morale, MD  meloxicam (MOBIC) 15 MG tablet Take 1 tablet (15 mg total) by mouth daily. 03/10/22   Laurey Morale, MD  metoprolol succinate (TOPROL-XL) 50 MG 24 hr tablet TAKE 1 TABLET BY MOUTH EVERY DAY 01/25/22   Laurey Morale, MD  Multiple Vitamins-Minerals (CENTRUM ADULTS PO) Take 1 tablet by mouth daily.    [provider]  mupirocin cream (BACTROBAN) 2 % Apply 1 application topically 2 (two) times daily. Patient taking differently: Apply 1 application  topically daily as  needed (for irritation). 03/31/21   Laurey Morale, MD  potassium chloride SA (KLOR-CON M) 20 MEQ tablet TAKE 1 TABLET(20 MEQ) BY MOUTH DAILY Patient taking differently: Take 20 mEq by mouth daily. 11/27/21   Laurey Morale, MD  VITAMIN E PO Take 1 capsule by mouth daily at 12 noon.    [provider]    Family History Family History  Problem Relation Age of Onset   Hypertension Mother    Diabetes Mother    Heart failure Mother        cad/mi with rupture ventricle   Hyperlipidemia Father    Colon polyps Father    Cancer Neg Hx        colon or prostate   Colon cancer Neg Hx    Esophageal cancer Neg Hx    Rectal cancer Neg Hx    Stomach cancer Neg Hx     Social History  Social History   Tobacco Use   Smoking status: Former    Packs/day: 1.00    Years: 5.00    Total pack years: 5.00    Types: Cigarettes    Quit date: 03/22/1968    Years since quitting: 54.1   Smokeless tobacco: Former    Types: Chew    Quit date: 09/20/2015   Tobacco comments:    occassionally  Substance Use Topics   Alcohol use: No    Alcohol/week: 0.0 standard drinks of alcohol   Drug use: No     Allergies   Ivp dye [iodinated contrast media], Penicillins, and Lisinopril   Review of Systems Review of Systems Per HPI  Physical Exam Triage Vital Signs ED Triage Vitals  Enc Vitals Group     BP 04/17/22 1443 (!) 152/97     Pulse Rate 04/17/22 1443 (!) 102     Resp 04/17/22 1443 20     Temp 04/17/22 1443 98.8 F (37.1 C)     Temp Source 04/17/22 1443 Oral     SpO2 04/17/22 1443 92 %     Weight --      Height --      Head Circumference --      Peak Flow --      Pain Score 04/17/22 1439 0     Pain Loc --      Pain Edu? --      Excl. in [place]? --    No data found.  Updated Vital Signs BP (!) 152/97 (BP Location: Right Arm)   Pulse (!) 102   Temp 98.8 F (37.1 C) (Oral)   Resp 20   SpO2 92%   Visual Acuity Right Eye Distance:   Left Eye Distance:   Bilateral Distance:     Right Eye Near:   Left Eye Near:    Bilateral Near:     Physical Exam Vitals and nursing note reviewed.  Constitutional:      Appearance: He is well-developed.  HENT:     Head: Atraumatic.     Right Ear: External ear normal.     Left Ear: External ear normal.     Nose: Congestion present.     Mouth/Throat:     Pharynx: Posterior oropharyngeal erythema present. No oropharyngeal exudate.  Eyes:     Conjunctiva/sclera: Conjunctivae normal.     Pupils: Pupils are equal, round, and reactive to light.  Cardiovascular:     Rate and Rhythm: Normal rate and regular rhythm.  Pulmonary:     Effort: Pulmonary effort is normal. No respiratory distress.     Breath sounds: Wheezing present. No rales.  Musculoskeletal:        General: Normal range of motion.     Cervical back: Normal range of motion and neck supple.  Lymphadenopathy:     Cervical: No cervical adenopathy.  Skin:    General: Skin is warm and dry.  Neurological:     Mental Status: He is alert and oriented to person, place, and time.  Psychiatric:        Behavior: Behavior normal.      UC Treatments / Results  Labs (all labs ordered are listed, but only abnormal results are displayed) Labs Reviewed - No data to display  EKG   Radiology No results found.  Procedures Procedures (including critical care time)  Medications Ordered in UC Medications  dexamethasone (DECADRON) injection 10 mg (10 mg Intramuscular Given 04/17/22 1540)    Initial Impression / Assessment  and Plan / UC Course  I have reviewed the triage vital signs and the nursing notes.  Pertinent labs & imaging results that were available during my care of the patient were reviewed by me and considered in my medical decision making (see chart for details).     Mildly tachycardic and hypertensive in triage, oxygen saturation 92% on room air and he appears in no acute respiratory distress.  Given duration, worsening course of symptoms as well as  new wheezing we will treat with IM Decadron, Zithromax, Mucinex, Phenergan DM and discussed continued albuterol as needed, supportive home care and return precautions.  Final Clinical Impressions(s) / UC Diagnoses   Final diagnoses:  Lower respiratory infection  Wheezing  Tachycardia  History of pneumonia   Discharge Instructions   None    ED Prescriptions     Medication Sig Dispense Auth. Provider   azithromycin (ZITHROMAX) 250 MG tablet Take 1 tablet (250 mg total) by mouth daily. Take first 2 tablets together, then 1 every day until finished. 6 tablet Volney American, PA-C   guaiFENesin (MUCINEX) 600 MG 12 hr tablet Take 1 tablet (600 mg total) by mouth 2 (two) times daily. 20 tablet Volney American, [place]   promethazine-dextromethorphan (PROMETHAZINE-DM) 6.25-15 MG/5ML syrup Take 5 mLs by mouth 4 (four) times daily as needed. 100 mL Volney American, [place]      PDMP not reviewed this encounter.   Volney American, [place] 04/21/22 [phone]

## 2022-04-27 ENCOUNTER — Encounter: Payer: Self-pay | Admitting: Podiatry

## 2022-04-27 ENCOUNTER — Ambulatory Visit: Payer: Medicare PPO | Admitting: Podiatry

## 2022-04-27 VITALS — BP 153/87 | HR 75 | Temp 98.1°F

## 2022-04-27 DIAGNOSIS — B351 Tinea unguium: Secondary | ICD-10-CM

## 2022-04-27 DIAGNOSIS — I739 Peripheral vascular disease, unspecified: Secondary | ICD-10-CM

## 2022-04-27 DIAGNOSIS — M79675 Pain in left toe(s): Secondary | ICD-10-CM

## 2022-04-27 DIAGNOSIS — M79674 Pain in right toe(s): Secondary | ICD-10-CM

## 2022-04-27 NOTE — Progress Notes (Unsigned)
  Subjective:  Patient ID: Gabriel Love, male    DOB: June 16, 1940,  MRN: [id number]  Gabriel Love presents to clinic today for {jgcomplaint:23593}  Chief Complaint  Patient presents with   Follow-up    Routine foot care Patient is not a diabetic PCP-Dr. Annie Main Fry/Last visit-03/10/22 Patient is taking all medications as prescribed.   New problem(s): None. {jgcomplaint:23593}  PCP is Laurey Morale, MD.  Allergies  Allergen Reactions   Ivp Dye [Iodinated Contrast Media] Anaphylaxis    Iv dye allergy in 1985, anaphylaxix//a.calhoun- "shocked back to life"   Penicillins Other (See Comments)    Urticaria Has patient had a PCN reaction causing immediate rash, facial/tongue/throat swelling, SOB or lightheadedness with hypotension: no Has patient had a PCN reaction causing severe rash involving mucus membranes or skin necrosis: no Has patient had a PCN reaction that required hospitalization no Has patient had a PCN reaction occurring within the last 10 years: yes If all of the above answers are "NO", then may proceed with Cephalosporin use.    Lisinopril Cough    Review of Systems: Negative except as noted in the HPI.  Objective: No changes noted in today's physical examination. Vitals:   04/27/22 1040  BP: (!) 153/87  Pulse: 75  Temp: 98.1 F (36.7 C)  SpO2: 90%   Gabriel Love is a pleasant 82 y.o. male morbidly obese in NAD. AAO x 3.  Neurovascular Examination: CFT <3 seconds b/l LE. Faintly palpable DP pulses b/l LE. Diminished PT pulse(s) b/l LE.   Pedal hair absent. No pain with calf compression b/l.   Lower extremity skin temperature gradient within normal limits.   Nonpitting edema noted BLE. Evidence of chronic venous insufficiency b/l LE. No cyanosis or clubbing noted b/l LE. Wearing compression hose b/l LE.  Protective sensation intact 5/5 intact bilaterally with 10g monofilament b/l. Vibratory sensation intact b/l.  Dermatological:  Pedal skin thin and  atrophic b/l LE. No open wounds b/l LE. No interdigital macerations noted b/l LE.   Toenails 1-5 b/l elongated, discolored, dystrophic, thickened, crumbly with subungual debris and tenderness to dorsal palpation.   No hyperkeratotic nor porokeratotic lesions present on today's visit.  Musculoskeletal:  Normal muscle strength 5/5 to RLE. Dropfoot LLE. Pes planus deformity noted bilateral LE.Marland Kitchen No pain, crepitus or joint limitation noted with ROM b/l LE.  Wearing shoe with brace LLE.  Assessment/Plan: 1. Pain due to onychomycosis of toenails of both feet   2. Peripheral vascular disease with claudication ([place])     No orders of the defined types were placed in this encounter.   None {Jgplan:23602::"-Patient/POA to call should there be question/concern in the interim."}   Return in about 3 months (around 07/26/2022).  Marzetta Board, DPM

## 2022-07-15 ENCOUNTER — Ambulatory Visit (INDEPENDENT_AMBULATORY_CARE_PROVIDER_SITE_OTHER): Payer: Medicare PPO

## 2022-07-15 ENCOUNTER — Other Ambulatory Visit: Payer: Self-pay

## 2022-07-15 ENCOUNTER — Encounter: Payer: Self-pay | Admitting: Emergency Medicine

## 2022-07-15 ENCOUNTER — Ambulatory Visit
Admission: EM | Admit: 2022-07-15 | Discharge: 2022-07-15 | Disposition: A | Discharge status: Home / Self Care | Payer: Medicare PPO | Attending: Family Medicine | Admitting: Family Medicine

## 2022-07-15 DIAGNOSIS — R0602 Shortness of breath: Secondary | ICD-10-CM | POA: Diagnosis not present

## 2022-07-15 DIAGNOSIS — R051 Acute cough: Secondary | ICD-10-CM

## 2022-07-15 DIAGNOSIS — R600 Localized edema: Secondary | ICD-10-CM | POA: Diagnosis not present

## 2022-07-15 DIAGNOSIS — R059 Cough, unspecified: Secondary | ICD-10-CM | POA: Diagnosis not present

## 2022-07-15 DIAGNOSIS — R062 Wheezing: Secondary | ICD-10-CM

## 2022-07-15 DIAGNOSIS — I10 Essential (primary) hypertension: Secondary | ICD-10-CM | POA: Diagnosis not present

## 2022-07-15 MED ORDER — PREDNISONE 20 MG PO TABS: 40.0000 mg | ORAL_TABLET | Freq: Every day | ORAL | 0 refills | Status: DC

## 2022-07-15 NOTE — ED Triage Notes (Addendum)
Pt reports productive cough and increased dyspnea with exertion and at night x2 days.   Denies any fever.  Pt also inquiring about recent flare-up of RLE redness and swelling. Reports history of similar and states "supposed to be wearing compression socks but broke my skin out even worse."

## 2022-07-15 NOTE — ED Provider Notes (Signed)
[facility]   [id number] 07/15/22 Arrival Time: 0906  ASSESSMENT & PLAN:  1. SOB (shortness of breath)   2. Acute cough   3. Wheezing   4. Edema of lower extremity   5. Elevated blood pressure reading with diagnosis of hypertension    I have personally viewed and independently interpreted the imaging studies obtained this visit. No acute changes on CXR; no pulmonary edema.  Pending: BASIC METABOLIC PANEL   Will notify of any significant results.  No resp distress but with significant wheezing here today. Begin: Meds ordered this encounter  Medications   predniSONE (DELTASONE) 20 MG tablet    Sig: Take 2 tablets (40 mg total) by mouth daily.    Dispense:  10 tablet    Refill:  0   Will elevate RLE over the next few days. Increase Lasix to 1.5 tablets QD for the next few days. No signs of skin infection.   Follow-up Information     Schedule an appointment as soon as possible for a visit  with Nelwyn Salisbury, MD.   Specialty: Family Medicine Why: To follow up on your leg swelling. Contact information: [address] [place] [address] [phone]                  Discharge Instructions      Take 1 1/2 of your fluid pills daily for the next several days. Elevated your right leg. This should help the swelling.  Swelling happens when fluid collects in small spaces around tissues and organs inside the body. Another word for swelling is "edema." Some common parts of the body where people can have swelling are the lower legs or hands. This typically is worse in the areas of the body that are closest to the ground (because of gravity)  Symptoms of swelling can include puffiness of the skin, which can cause the skin to look stretched and shiny. This often occurs with swelling in the lower legs and can be worse after you sit or stand for a long time.  Treatment of edema includes several components: treatment of the underlying cause (if  possible), reducing the amount of salt (sodium) in your diet, and, in many cases, use of a medication called a diuretic to eliminate excess fluid. Using compression stockings and elevating the legs may also be recommended.   You have had labs (blood test) sent today. We will call you with any significant abnormalities or if there is need to begin or change treatment or pursue further follow up.       Reviewed expectations re: course of current medical issues. Questions answered. Outlined signs and symptoms indicating need for more acute intervention. Patient verbalized understanding. After Visit Summary given.   SUBJECTIVE:  History from: patient. Gabriel Love is a 82 y.o. male who presents with complaint of cough and increased dyspnea with exertion and at night x2 days. Wheezing; feels like this is why he feels SOB. Denies CP. Denies fevers. Also with exacerbation of RLE edema. Wife reports he is not taking Lasix regularly. States "supposed to be wearing compression socks but broke my skin out even worse." Ambulatory with walker.  Social History   Tobacco Use  Smoking Status Former   Packs/day: 1.00   Years: 5.00   Additional pack years: 0.00   Total pack years: 5.00   Types: Cigarettes   Quit date: 03/22/1968   Years since quitting: 54.3  Smokeless Tobacco Former   Types: Sports administrator  Quit date: 09/20/2015  Tobacco Comments   occassionally   Social History   Substance and Sexual Activity  Alcohol Use No   Alcohol/week: 0.0 standard drinks of alcohol   Increased blood pressure noted today. Reports that he is treated for HTN. Not taking medications regularly.  OBJECTIVE:  Vitals:   07/15/22 0915  BP: (!) 166/83  Pulse: 73  Resp: 20  Temp: 98.6 F (37 C)  TempSrc: Oral  SpO2: 94%    General appearance: alert, oriented, no acute distress Eyes: PERRLA; EOMI; conjunctivae normal HENT: normocephalic; atraumatic Neck: supple with FROM Abd: obese Lungs: without  labored respirations; speaks full sentences without difficulty; bilateral exp wheezing present Heart: regular Chest Wall: without tenderness to palpation Extremities: with 2+ pitting edema of RLE; without calf swelling or tenderness; has compression sock and brace on LLE (not removed per his request) Skin: warm and dry Neuro: normal gait Psychological: alert and cooperative; normal mood and affect  Labs: Labs Reviewed  BASIC METABOLIC PANEL    Imaging: DG Chest 2 View  Result Date: 07/15/2022 CLINICAL DATA:  Cough, wheezing, and shortness of breath for 2 days EXAM: CHEST - 2 VIEW COMPARISON:  04/17/2022 FINDINGS: Normal heart size, mediastinal contours, and pulmonary vascularity. Atherosclerotic calcification aorta. Lungs clear. No pulmonary infiltrate, pleural effusion, or pneumothorax. Degenerative changes of the glenohumeral joints bilaterally. IMPRESSION: No acute abnormalities. Aortic Atherosclerosis (ICD10-I70.0). Electronically Signed   By: Ulyses Southward M.D.   On: 07/15/2022 09:46     Allergies  Allergen Reactions   Ivp Dye [Iodinated Contrast Media] Anaphylaxis    Iv dye allergy in 1985, anaphylaxix//a.calhoun- "shocked back to life"   Penicillins Other (See Comments)    Urticaria Has patient had a PCN reaction causing immediate rash, facial/tongue/throat swelling, SOB or lightheadedness with hypotension: no Has patient had a PCN reaction causing severe rash involving mucus membranes or skin necrosis: no Has patient had a PCN reaction that required hospitalization no Has patient had a PCN reaction occurring within the last 10 years: yes If all of the above answers are "NO", then may proceed with Cephalosporin use.    Lisinopril Cough    Past Medical History:  Diagnosis Date   Abdominal hernia    Allergy    Blood transfusion without reported diagnosis 12/2006   At [facility] with hip [facility] - 2 units transfused   Cancer    skin cancers   Cataract    Depression    DJD  (degenerative joint disease)    shoulders bilateral   Environmental allergies    Full dentures    Grade I diastolic dysfunction 05/08/2020   Hearing loss    bilateral hearing aids   History of colonic polyps    History of urinary tract infection    Hyperlipidemia    under control   Hypertension    "borderline"   Nonspecific abnormal finding in stool contents    Other general symptoms(780.99)    Prostate hypertrophy    sees Dr. Patsi Sears    Urinary leakage    Social History   Socioeconomic History   Marital status: Married    Spouse name: Not on file   Number of children: 1   Years of education: Not on file   Highest education level: Not on file  Occupational History   Occupation: Retired  Tobacco Use   Smoking status: Former    Packs/day: 1.00    Years: 5.00    Additional pack years: 0.00    Total pack  years: 5.00    Types: Cigarettes    Quit date: 03/22/1968    Years since quitting: 54.3   Smokeless tobacco: Former    Types: Chew    Quit date: 09/20/2015   Tobacco comments:    occassionally  Substance and Sexual Activity   Alcohol use: No    Alcohol/week: 0.0 standard drinks of alcohol   Drug use: No   Sexual activity: Not on file  Other Topics Concern   Not on file  Social History Narrative   Married 1965   Very supportive wife      One daughter  16  One grandchild (girl  2002)   Retired from Recruitment consultant work         Social Determinants of Corporate investment banker Strain: Not on Ship broker Insecurity: Not on file  Transportation Needs: Not on file  Physical Activity: Not on file  Stress: Not on file  Social Connections: Not on file  Intimate Partner Violence: Not on file   Family History  Problem Relation Age of Onset   Hypertension Mother    Diabetes Mother    Heart failure Mother        cad/mi with rupture ventricle   Hyperlipidemia Father    Colon polyps Father    Cancer Neg Hx        colon or prostate   Colon cancer Neg Hx     Esophageal cancer Neg Hx    Rectal cancer Neg Hx    Stomach cancer Neg Hx    Past Surgical History:  Procedure Laterality Date   CATARACT EXTRACTION W/PHACO Right 03/25/2014   Procedure: CATARACT EXTRACTION PHACO AND INTRAOCULAR LENS PLACEMENT; CDE:9.76;  Surgeon: Susa Simmonds, MD;  Location: [facility];  Service: Ophthalmology;  Laterality: Right;   CATARACT EXTRACTION W/PHACO Left 06/24/2014   Procedure: CATARACT EXTRACTION PHACO AND INTRAOCULAR LENS PLACEMENT (IOC);  Surgeon: Susa Simmonds, MD;  Location: [facility];  Service: Ophthalmology;  Laterality: Left;  CDE:8.45   CIRCUMCISION N/A 03/26/2016   Procedure: CIRCUMCISION ADULT;  Surgeon: Jethro Bolus, MD;  Location: [facility];  Service: Urology;  Laterality: N/AAlycia Patten, NON-NEWBORN  1985   COLONOSCOPY  06-19-13, 03/05/15   per Dr. Arlyce Dice, mass at the ileocecal valve plus several polyps, pathology benign, repeat in one year, polyp 2016    COLONOSCOPY WITH PROPOFOL N/A 07/29/2015   Procedure: COLONOSCOPY WITH PROPOFOL;  Surgeon: Ruffin Frederick, MD;  Location: [facility];  Service: Gastroenterology;  Laterality: N/A;   JOINT REPLACEMENT  12/2006   total left hip per Dr. Eulah Pont    LAPAROSCOPIC PARTIAL COLECTOMY N/A 08/07/2013   Procedure: LAPAROSCOPIC right PARTIAL COLECTOMY;  Surgeon: Romie Levee, MD;  Location: [facility];  Service: General;  Laterality: N/A;   MULTIPLE TOOTH EXTRACTIONS     PROSTATE SURGERY  03-29-11   had CTT per Dr. Patsi Sears, in office      Mardella Layman, MD 07/15/22 1059

## 2022-07-15 NOTE — Discharge Instructions (Addendum)
Take 1 1/2 of your fluid pills daily for the next several days. Elevated your right leg. This should help the swelling.  Swelling happens when fluid collects in small spaces around tissues and organs inside the body. Another word for swelling is "edema." Some common parts of the body where people can have swelling are the lower legs or hands. This typically is worse in the areas of the body that are closest to the ground (because of gravity)  Symptoms of swelling can include puffiness of the skin, which can cause the skin to look stretched and shiny. This often occurs with swelling in the lower legs and can be worse after you sit or stand for a long time.  Treatment of edema includes several components: treatment of the underlying cause (if possible), reducing the amount of salt (sodium) in your diet, and, in many cases, use of a medication called a diuretic to eliminate excess fluid. Using compression stockings and elevating the legs may also be recommended.   You have had labs (blood test) sent today. We will call you with any significant abnormalities or if there is need to begin or change treatment or pursue further follow up.

## 2022-07-16 LAB — BASIC METABOLIC PANEL
BUN/Creatinine Ratio: 22 (ref 10–24)
BUN: 19 mg/dL (ref 8–27)
CO2: 22 mmol/L (ref 20–29)
Calcium: 8.5 mg/dL — ABNORMAL LOW (ref 8.6–10.2)
Chloride: 103 mmol/L (ref 96–106)
Creatinine, Ser: 0.88 mg/dL (ref 0.76–1.27)
Glucose: 97 mg/dL (ref 70–99)
Potassium: 4.6 mmol/L (ref 3.5–5.2)
Sodium: 141 mmol/L (ref 134–144)
eGFR: 86 mL/min/{1.73_m2} (ref >59)

## 2022-07-26 ENCOUNTER — Ambulatory Visit: Payer: Medicare PPO | Admitting: Podiatry

## 2022-08-04 ENCOUNTER — Ambulatory Visit: Payer: Medicare PPO | Admitting: Podiatry

## 2022-08-04 ENCOUNTER — Encounter: Payer: Self-pay | Admitting: Podiatry

## 2022-08-04 DIAGNOSIS — B351 Tinea unguium: Secondary | ICD-10-CM

## 2022-08-04 DIAGNOSIS — M79675 Pain in left toe(s): Secondary | ICD-10-CM | POA: Diagnosis not present

## 2022-08-04 DIAGNOSIS — I739 Peripheral vascular disease, unspecified: Secondary | ICD-10-CM

## 2022-08-04 DIAGNOSIS — M79674 Pain in right toe(s): Secondary | ICD-10-CM | POA: Diagnosis not present

## 2022-08-07 NOTE — Progress Notes (Signed)
  Subjective:  Patient ID: Gabriel Love, male    DOB: 11-21-1940,  MRN: [id number]  Gabriel Love presents to clinic today for at risk foot care. Patient has h/o PAD and painful elongated mycotic toenails 1-5 bilaterally which are tender when wearing enclosed shoe gear. Pain is relieved with periodic professional debridement.  Chief Complaint  Patient presents with   Nail Problem    [facility] PCP-Fry PCP [facility]- 2 or 3 months ago    New problem(s): None.   PCP is Nelwyn Salisbury, MD.  Allergies  Allergen Reactions   Ivp Dye [Iodinated Contrast Media] Anaphylaxis    Iv dye allergy in 1985, anaphylaxix//a.calhoun- "shocked back to life"   Penicillins Other (See Comments)    Urticaria Has patient had a PCN reaction causing immediate rash, facial/tongue/throat swelling, SOB or lightheadedness with hypotension: no Has patient had a PCN reaction causing severe rash involving mucus membranes or skin necrosis: no Has patient had a PCN reaction that required hospitalization no Has patient had a PCN reaction occurring within the last 10 years: yes If all of the above answers are "NO", then may proceed with Cephalosporin use.    Lisinopril Cough    Review of Systems: Negative except as noted in the HPI.  Objective: No changes noted in today's physical examination. There were no vitals filed for this visit. Gabriel Love is a pleasant 82 y.o. male morbidly obese in NAD. AAO x 3.  Neurovascular Examination: CFT <3 seconds b/l LE. Faintly palpable DP pulses b/l LE. Diminished PT pulse(s) b/l LE.   Pedal hair absent. No pain with calf compression b/l.   Lower extremity skin temperature gradient within normal limits.   Nonpitting edema noted BLE. Evidence of chronic venous insufficiency b/l LE. No cyanosis or clubbing noted b/l LE. Wearing compression hose b/l LE.  Protective sensation intact 5/5 intact bilaterally with 10g monofilament b/l. Vibratory sensation intact  b/l.  Assessment/Plan: 1. Pain due to onychomycosis of toenails of both feet   2. Peripheral vascular disease with claudication (HCC)     -Consent given for treatment as described below: -Examined patient. -Patient to continue soft, supportive shoe gear daily. -Mycotic toenails 1-5 bilaterally were debrided in length and girth with sterile nail nippers and dremel without incident. -Patient/POA to call should there be question/concern in the interim.   Return in about 3 months (around 11/04/2022).  Gabriel Love, DPM

## 2022-08-10 ENCOUNTER — Other Ambulatory Visit: Payer: Self-pay | Admitting: Family Medicine

## 2022-09-01 ENCOUNTER — Ambulatory Visit
Admission: EM | Admit: 2022-09-01 | Discharge: 2022-09-01 | Disposition: A | Discharge status: Home / Self Care | Payer: Medicare PPO | Attending: Nurse Practitioner | Admitting: Nurse Practitioner

## 2022-09-01 ENCOUNTER — Ambulatory Visit: Payer: Medicare PPO

## 2022-09-01 DIAGNOSIS — H6123 Impacted cerumen, bilateral: Secondary | ICD-10-CM

## 2022-09-01 NOTE — ED Provider Notes (Signed)
[facility]    CSN: [id number] Arrival date & time: 09/01/22  0957      History   Chief Complaint No chief complaint on file.   HPI Gabriel Love is a 82 y.o. male.   The history is provided by the patient and the spouse.   The patient presents for complaints of his ears feeling clogged for the past 2 weeks.  Patient's spouse states that he went to see his audiologist, and he commented that the patient had "a lot of earwax in both ears".  Patient complains of decreased hearing from both ears.  He denies fever, chills, ear drainage, ear pain, dizziness, or headache.  Patient spouse states she has been using earwax softener for his ears.  Past Medical History:  Diagnosis Date   Abdominal hernia    Allergy    Blood transfusion without reported diagnosis 12/2006   At [facility] with hip replacement - 2 units transfused   Cancer (HCC)    skin cancers   Cataract    Depression    DJD (degenerative joint disease)    shoulders bilateral   Environmental allergies    Full dentures    Grade I diastolic dysfunction 05/08/2020   Hearing loss    bilateral hearing aids   History of colonic polyps    History of urinary tract infection    Hyperlipidemia    under control   Hypertension    "borderline"   Nonspecific abnormal finding in stool contents    Other general symptoms(780.99)    Prostate hypertrophy    sees Dr. Patsi Sears    Urinary leakage     Patient Active Problem List   Diagnosis Date Noted   Pain due to onychomycosis of toenails of both feet 04/27/2022   Multifocal pneumonia 01/02/2022   Asthma exacerbation 01/02/2022   Acute gastroenteritis 05/09/2020   Sepsis secondary to UTI (HCC) 05/08/2020   Grade I diastolic dysfunction 05/08/2020   Class 1 obesity 05/08/2020   Hyponatremia 05/08/2020   Hypokalemia 05/08/2020   Hypoalbuminemia 05/08/2020   Mild protein malnutrition (HCC) 05/08/2020   Depression with anxiety 07/24/2019   Morbid obesity (HCC)  04/04/2018   Bilateral leg edema 02/07/2017   Osteoarthritis 01/28/2017   Urge and stress incontinence 01/28/2017   History of colonic polyps    Colon polyp 08/07/2013   Benign neoplasm of ileocecal valve, 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   Nonspecific abnormal finding in stool contents 05/29/2013   Atherosclerotic peripheral vascular disease with intermittent claudication (HCC) 06/14/2012   Spinal stenosis of lumbar region 06/14/2012   DEGENERATIVE DISC DISEASE, 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   Hyperlipidemia 11/23/2006   Essential hypertension 11/23/2006   HEMATURIA 11/23/2006   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    Past Surgical History:  Procedure Laterality Date   CATARACT EXTRACTION W/PHACO Right 03/25/2014   Procedure: CATARACT EXTRACTION PHACO AND INTRAOCULAR LENS PLACEMENT; CDE:9.76;  Surgeon: Susa Simmonds, MD;  Location: [facility];  Service: Ophthalmology;  Laterality: Right;   CATARACT EXTRACTION W/PHACO Left 06/24/2014   Procedure: CATARACT EXTRACTION PHACO AND INTRAOCULAR LENS PLACEMENT (IOC);  Surgeon: Susa Simmonds, MD;  Location: [facility];  Service: Ophthalmology;  Laterality: Left;  CDE:8.45   CIRCUMCISION N/A 03/26/2016   Procedure: CIRCUMCISION ADULT;  Surgeon: Jethro Bolus, MD;  Location: [facility];  Service: Urology;  Laterality: N/AAlycia Patten, NON-NEWBORN  1985   COLONOSCOPY  06-19-13, 03/05/15   per Dr. Arlyce Dice, mass at the ileocecal valve plus several polyps, pathology benign, repeat  in one year, polyp 2016    COLONOSCOPY WITH PROPOFOL N/A 07/29/2015   Procedure: COLONOSCOPY WITH PROPOFOL;  Surgeon: Ruffin Frederick, MD;  [facility];  Service: Gastroenterology;  Laterality: N/A;   JOINT REPLACEMENT  12/2006   total left hip per Dr. Eulah Pont    LAPAROSCOPIC PARTIAL COLECTOMY N/A 08/07/2013   Procedure: LAPAROSCOPIC right PARTIAL COLECTOMY;  Surgeon: Romie Levee, MD;  Location: [facility];  Service: General;  Laterality: N/A;   MULTIPLE TOOTH EXTRACTIONS      PROSTATE SURGERY  03-29-11   had CTT per Dr. Patsi Sears, in office       Home Medications    Prior to Admission medications   Medication Sig Start Date End Date Taking? Authorizing Provider  albuterol (VENTOLIN HFA) 108 (90 Base) MCG/ACT inhaler Inhale 2 puffs into the lungs every 6 (six) hours as needed for wheezing or shortness of breath. 01/04/22   Leatha Gilding, MD  Ascorbic Acid (VITAMIN C PO) Take 1 tablet by mouth daily.    [provider]  aspirin EC 81 MG tablet Take 81 mg by mouth daily.    [provider]  atorvastatin (LIPITOR) 40 MG tablet TAKE 1 TABLET BY MOUTH DAILY 03/05/22   Nelwyn Salisbury, MD  buPROPion (WELLBUTRIN XL) 150 MG 24 hr tablet Take 1 tablet (150 mg total) by mouth daily. 03/10/22   Nelwyn Salisbury, MD  Cholecalciferol (VITAMIN D3 PO) Take 1 tablet by mouth daily at 12 noon.    [provider]  furosemide (LASIX) 40 MG tablet TAKE 1 TABLET(40 MG) BY MOUTH TWICE DAILY 03/17/22   Nelwyn Salisbury, MD  guaiFENesin (MUCINEX) 600 MG 12 hr tablet Take 1 tablet (600 mg total) by mouth 2 (two) times daily. 04/17/22   Particia Nearing, PA-C  LEXAPRO 20 MG tablet TAKE 1 TABLET(20 MG) BY MOUTH DAILY 03/10/22   Nelwyn Salisbury, MD  meloxicam (MOBIC) 15 MG tablet Take 1 tablet (15 mg total) by mouth daily. 03/10/22   Nelwyn Salisbury, MD  metoprolol succinate (TOPROL-XL) 50 MG 24 hr tablet TAKE 1 TABLET BY MOUTH EVERY DAY 08/10/22   Nelwyn Salisbury, MD  Multiple Vitamins-Minerals (CENTRUM ADULTS PO) Take 1 tablet by mouth daily.    [provider]  mupirocin cream (BACTROBAN) 2 % Apply 1 application topically 2 (two) times daily. Patient taking differently: Apply 1 application  topically daily as needed (for irritation). 03/31/21   Nelwyn Salisbury, MD  potassium chloride SA (KLOR-CON M) 20 MEQ tablet TAKE 1 TABLET(20 MEQ) BY MOUTH DAILY Patient taking differently: Take 20 mEq by mouth daily. 11/27/21   Nelwyn Salisbury, MD  predniSONE  (DELTASONE) 20 MG tablet Take 2 tablets (40 mg total) by mouth daily. 07/15/22   Mardella Layman, MD  promethazine-dextromethorphan (PROMETHAZINE-DM) 6.25-15 MG/5ML syrup Take 5 mLs by mouth 4 (four) times daily as needed. 04/17/22   Particia Nearing, PA-C  VITAMIN E PO Take 1 capsule by mouth daily at 12 noon.    [provider]    Family History Family History  Problem Relation Age of Onset   Hypertension Mother    Diabetes Mother    Heart failure Mother        cad/mi with rupture ventricle   Hyperlipidemia Father    Colon polyps Father    Cancer Neg Hx        colon or prostate   Colon cancer Neg Hx    Esophageal cancer Neg  Hx    Rectal cancer Neg Hx    Stomach cancer Neg Hx     Social History Social History   Tobacco Use   Smoking status: Former    Packs/day: 1.00    Years: 5.00    Additional pack years: 0.00    Total pack years: 5.00    Types: Cigarettes    Quit date: 03/22/1968    Years since quitting: 54.4   Smokeless tobacco: Former    Types: Chew    Quit date: 09/20/2015   Tobacco comments:    occassionally  Substance Use Topics   Alcohol use: No    Alcohol/week: 0.0 standard drinks of alcohol   Drug use: No     Allergies   Ivp dye [iodinated contrast media], Penicillins, and Lisinopril   Review of Systems Review of Systems Per HPI  Physical Exam Triage Vital Signs ED Triage Vitals  Enc Vitals Group     BP 09/01/22 1019 (!) 146/88     Pulse Rate 09/01/22 1019 65     Resp 09/01/22 1019 20     Temp 09/01/22 1019 97.9 F (36.6 C)     Temp Source 09/01/22 1019 Oral     SpO2 09/01/22 1019 92 %     Weight --      Height --      Head Circumference --      Peak Flow --      Pain Score 09/01/22 1020 0     Pain Loc --      Pain Edu? --      Excl. in GC? --    No data found.  Updated Vital Signs BP (!) 146/88 (BP Location: Left Arm)   Pulse 65   Temp 97.9 F (36.6 C) (Oral)   Resp 20   SpO2 92%   Visual Acuity Right Eye  Distance:   Left Eye Distance:   Bilateral Distance:    Right Eye Near:   Left Eye Near:    Bilateral Near:     Physical Exam Vitals and nursing note reviewed.  Constitutional:      General: He is not in acute distress.    Appearance: Normal appearance.  HENT:     Head: Normocephalic.     Right Ear: External ear normal. Decreased hearing noted. There is impacted cerumen.     Left Ear: External ear normal. Decreased hearing noted. There is impacted cerumen.     Ears:     Comments: Wears hearing aids bilaterally    Nose: Nose normal.     Mouth/Throat:     Mouth: Mucous membranes are moist.  Eyes:     Extraocular Movements: Extraocular movements intact.     Pupils: Pupils are equal, round, and reactive to light.  Pulmonary:     Effort: Pulmonary effort is normal.  Musculoskeletal:     Cervical back: Normal range of motion.  Skin:    General: Skin is warm and dry.  Neurological:     Mental Status: He is alert and oriented to person, place, and time.  Psychiatric:        Mood and Affect: Mood normal.        Behavior: Behavior normal.      UC Treatments / Results  Labs (all labs ordered are listed, but only abnormal results are displayed) Labs Reviewed - No data to display  EKG   Radiology No results found.  Procedures Procedures (including critical care time)  Medications Ordered in  UC Medications - No data to display  Initial Impression / Assessment and Plan / UC Course  I have reviewed the triage vital signs and the nursing notes.  Pertinent labs & imaging results that were available during my care of the patient were reviewed by me and considered in my medical decision making (see chart for details).  The patient is well-appearing, he is in no acute distress, vital signs are stable.  Irrigation of both ears was performed, with complete removal of the earwax bilaterally.  Patient tolerated well.  Recommend continuing earwax softener 2-3 times per week.   Supportive care recommendations were discussed with the patient to include discontinuing use of Q-tips, and use of peroxide as needed in the ears.  Patient and spouse were advised to follow-up in this clinic over their PCPs office for earwax irrigation as needed.  Patient and spouse verbalized understanding.  All questions were answered.  Patient stable for discharge.  Final Clinical Impressions(s) / UC Diagnoses   Final diagnoses:  Bilateral impacted cerumen     Discharge Instructions      Ear irrigation was performed, with complete removal of earwax from both ears. Continue use of the earwax softener.  Recommend applying the eardrops 2-3 times per week. Also recommend over-the-counter Zyrtec for seasonal allergies. Recommend following up with your primary care physician's office or in this clinic in the future for earwax irrigation. Follow-up as needed.     ED Prescriptions   None    PDMP not reviewed this encounter.   Abran Cantor, NP 09/01/22 1111

## 2022-09-01 NOTE — ED Triage Notes (Signed)
Pt reports he has some bilateral ear wax build up 2 weeks. Pt's wife states he wheezes really bad and has a cough

## 2022-09-01 NOTE — Discharge Instructions (Addendum)
Ear irrigation was performed, with complete removal of earwax from both ears. Continue use of the earwax softener.  Recommend applying the eardrops 2-3 times per week. Also recommend over-the-counter Zyrtec for seasonal allergies. Recommend following up with your primary care physician's office or in this clinic in the future for earwax irrigation. Follow-up as needed.

## 2022-09-12 ENCOUNTER — Other Ambulatory Visit: Payer: Self-pay | Admitting: Family Medicine

## 2022-09-12 DIAGNOSIS — E785 Hyperlipidemia, unspecified: Secondary | ICD-10-CM

## 2022-09-17 ENCOUNTER — Ambulatory Visit
Admission: EM | Admit: 2022-09-17 | Discharge: 2022-09-17 | Disposition: A | Discharge status: Home / Self Care | Payer: Medicare PPO | Attending: Family Medicine | Admitting: Family Medicine

## 2022-09-17 ENCOUNTER — Ambulatory Visit (INDEPENDENT_AMBULATORY_CARE_PROVIDER_SITE_OTHER): Payer: Medicare PPO

## 2022-09-17 ENCOUNTER — Encounter: Payer: Self-pay | Admitting: Emergency Medicine

## 2022-09-17 DIAGNOSIS — R062 Wheezing: Secondary | ICD-10-CM

## 2022-09-17 DIAGNOSIS — J4541 Moderate persistent asthma with (acute) exacerbation: Secondary | ICD-10-CM | POA: Diagnosis not present

## 2022-09-17 DIAGNOSIS — R058 Other specified cough: Secondary | ICD-10-CM | POA: Diagnosis not present

## 2022-09-17 DIAGNOSIS — R0602 Shortness of breath: Secondary | ICD-10-CM

## 2022-09-17 MED ORDER — PROMETHAZINE-DM 6.25-15 MG/5ML PO SYRP: 5.0000 mL | ORAL_SOLUTION | Freq: Four times a day (QID) | ORAL | 0 refills | Status: DC | PRN

## 2022-09-17 MED ORDER — PREDNISONE 20 MG PO TABS: 40.0000 mg | ORAL_TABLET | Freq: Every day | ORAL | 0 refills | Status: DC

## 2022-09-17 MED ORDER — IPRATROPIUM-ALBUTEROL 0.5-2.5 (3) MG/3ML IN SOLN
3.0000 mL | Freq: Once | RESPIRATORY_TRACT | Status: AC
  Administered 2022-09-17: 3 mL via RESPIRATORY_TRACT

## 2022-09-17 MED ORDER — ALBUTEROL SULFATE HFA 108 (90 BASE) MCG/ACT IN AERS: 2.0000 | INHALATION_SPRAY | RESPIRATORY_TRACT | 0 refills | Status: DC | PRN

## 2022-09-17 NOTE — ED Triage Notes (Signed)
Productive cough and wheezing.  Was seen here on 6/12 for same and was told it was allergies.  States SOB has gotten worse.  Has been taking mucinex without relief.

## 2022-09-17 NOTE — ED Provider Notes (Signed)
[facility]    CSN: [id number] Arrival date & time: 09/17/22  1147      History   Chief Complaint No chief complaint on file.   HPI Gabriel Love is a 82 y.o. male.   Patient presenting today with going on 2 months of progressively worsening shortness of breath, wheezing, productive cough.  Denies fever, chills, chest pain, abdominal pain, nausea vomiting or diarrhea.  Was given prednisone at initial presentation and has albuterol for as needed use, these have helped in the short-term but not fully resolving the issue.  Was told he had seasonal allergies so has been taking antihistamines daily but has not noticed much benefit from this.  Does have a history of asthma.    Past Medical History:  Diagnosis Date   Abdominal hernia    Allergy    Blood transfusion without reported diagnosis 12/2006   At [facility] with hip replacement - 2 units transfused   Cancer (HCC)    skin cancers   Cataract    Depression    DJD (degenerative joint disease)    shoulders bilateral   Environmental allergies    Full dentures    Grade I diastolic dysfunction 05/08/2020   Hearing loss    bilateral hearing aids   History of colonic polyps    History of urinary tract infection    Hyperlipidemia    under control   Hypertension    "borderline"   Nonspecific abnormal finding in stool contents    Other general symptoms(780.99)    Prostate hypertrophy    sees Dr. Patsi Sears    Urinary leakage     Patient Active Problem List   Diagnosis Date Noted   Pain due to onychomycosis of toenails of both feet 04/27/2022   Multifocal pneumonia 01/02/2022   Asthma exacerbation 01/02/2022   Acute gastroenteritis 05/09/2020   Sepsis secondary to UTI (HCC) 05/08/2020   Grade I diastolic dysfunction 05/08/2020   Class 1 obesity 05/08/2020   Hyponatremia 05/08/2020   Hypokalemia 05/08/2020   Hypoalbuminemia 05/08/2020   Mild protein malnutrition (HCC) 05/08/2020   Depression with anxiety  07/24/2019   Morbid obesity (HCC) 04/04/2018   Bilateral leg edema 02/07/2017   Osteoarthritis 01/28/2017   Urge and stress incontinence 01/28/2017   History of colonic polyps    Colon polyp 08/07/2013   Benign neoplasm of ileocecal valve, 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   Nonspecific abnormal finding in stool contents 05/29/2013   Atherosclerotic peripheral vascular disease with intermittent claudication (HCC) 06/14/2012   Spinal stenosis of lumbar region 06/14/2012   DEGENERATIVE DISC DISEASE, 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   Hyperlipidemia 11/23/2006   Essential hypertension 11/23/2006   HEMATURIA 11/23/2006   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    Past Surgical History:  Procedure Laterality Date   CATARACT EXTRACTION W/PHACO Right 03/25/2014   Procedure: CATARACT EXTRACTION PHACO AND INTRAOCULAR LENS PLACEMENT; CDE:9.76;  Surgeon: Susa Simmonds, MD;  Location: [facility];  Service: Ophthalmology;  Laterality: Right;   CATARACT EXTRACTION W/PHACO Left 06/24/2014   Procedure: CATARACT EXTRACTION PHACO AND INTRAOCULAR LENS PLACEMENT (IOC);  Surgeon: Susa Simmonds, MD;  Location: [facility];  Service: Ophthalmology;  Laterality: Left;  CDE:8.45   CIRCUMCISION N/A 03/26/2016   Procedure: CIRCUMCISION ADULT;  Surgeon: Jethro Bolus, MD;  Location: [facility];  Service: Urology;  Laterality: N/AAlycia Patten, NON-NEWBORN  1985   COLONOSCOPY  06-19-13, 03/05/15   per Dr. Arlyce Dice, mass at the ileocecal valve plus several polyps, pathology benign, repeat in  one year, polyp 2016    COLONOSCOPY WITH PROPOFOL N/A 07/29/2015   Procedure: COLONOSCOPY WITH PROPOFOL;  Surgeon: Ruffin Frederick, MD;  Location: [facility];  Service: Gastroenterology;  Laterality: N/A;   JOINT REPLACEMENT  12/2006   total left hip per Dr. Eulah Pont    LAPAROSCOPIC PARTIAL COLECTOMY N/A 08/07/2013   Procedure: LAPAROSCOPIC right PARTIAL COLECTOMY;  Surgeon: Romie Levee, MD;  Location: [facility];  Service: General;  Laterality:  N/A;   MULTIPLE TOOTH EXTRACTIONS     PROSTATE SURGERY  03-29-11   had CTT per Dr. Patsi Sears, in office       Home Medications    Prior to Admission medications   Medication Sig Start Date End Date Taking? Authorizing Provider  predniSONE (DELTASONE) 20 MG tablet Take 2 tablets (40 mg total) by mouth daily with breakfast. 09/17/22  Yes Particia Nearing, PA-C  promethazine-dextromethorphan (PROMETHAZINE-DM) 6.25-15 MG/5ML syrup Take 5 mLs by mouth 4 (four) times daily as needed. 09/17/22  Yes Particia Nearing, PA-C  albuterol (VENTOLIN HFA) 108 (90 Base) MCG/ACT inhaler Inhale 2 puffs into the lungs every 4 (four) hours as needed for wheezing or shortness of breath. 09/17/22   Particia Nearing, PA-C  Ascorbic Acid (VITAMIN C PO) Take 1 tablet by mouth daily.    [provider]  aspirin EC 81 MG tablet Take 81 mg by mouth daily.    [provider]  atorvastatin (LIPITOR) 40 MG tablet TAKE 1 TABLET BY MOUTH DAILY 09/13/22   Nelwyn Salisbury, MD  buPROPion (WELLBUTRIN XL) 150 MG 24 hr tablet Take 1 tablet (150 mg total) by mouth daily. 03/10/22   Nelwyn Salisbury, MD  Cholecalciferol (VITAMIN D3 PO) Take 1 tablet by mouth daily at 12 noon.    [provider]  furosemide (LASIX) 40 MG tablet TAKE 1 TABLET(40 MG) BY MOUTH TWICE DAILY 03/17/22   Nelwyn Salisbury, MD  guaiFENesin (MUCINEX) 600 MG 12 hr tablet Take 1 tablet (600 mg total) by mouth 2 (two) times daily. 04/17/22   Particia Nearing, PA-C  LEXAPRO 20 MG tablet TAKE 1 TABLET(20 MG) BY MOUTH DAILY 03/10/22   Nelwyn Salisbury, MD  meloxicam (MOBIC) 15 MG tablet Take 1 tablet (15 mg total) by mouth daily. 03/10/22   Nelwyn Salisbury, MD  metoprolol succinate (TOPROL-XL) 50 MG 24 hr tablet TAKE 1 TABLET BY MOUTH EVERY DAY 08/10/22   Nelwyn Salisbury, MD  Multiple Vitamins-Minerals (CENTRUM ADULTS PO) Take 1 tablet by mouth daily.    [provider]  mupirocin cream (BACTROBAN) 2 % Apply 1 application  topically 2 (two) times daily. Patient taking differently: Apply 1 application  topically daily as needed (for irritation). 03/31/21   Nelwyn Salisbury, MD  potassium chloride SA (KLOR-CON M) 20 MEQ tablet TAKE 1 TABLET(20 MEQ) BY MOUTH DAILY Patient taking differently: Take 20 mEq by mouth daily. 11/27/21   Nelwyn Salisbury, MD  promethazine-dextromethorphan (PROMETHAZINE-DM) 6.25-15 MG/5ML syrup Take 5 mLs by mouth 4 (four) times daily as needed. 04/17/22   Particia Nearing, PA-C  VITAMIN E PO Take 1 capsule by mouth daily at 12 noon.    [provider]    Family History Family History  Problem Relation Age of Onset   Hypertension Mother    Diabetes Mother    Heart failure Mother        cad/mi with rupture ventricle   Hyperlipidemia Father    Colon polyps Father  Cancer Neg Hx        colon or prostate   Colon cancer Neg Hx    Esophageal cancer Neg Hx    Rectal cancer Neg Hx    Stomach cancer Neg Hx     Social History Social History   Tobacco Use   Smoking status: Former    Packs/day: 1.00    Years: 5.00    Additional pack years: 0.00    Total pack years: 5.00    Types: Cigarettes    Quit date: 03/22/1968    Years since quitting: 54.5   Smokeless tobacco: Former    Types: Chew    Quit date: 09/20/2015   Tobacco comments:    occassionally  Substance Use Topics   Alcohol use: No    Alcohol/week: 0.0 standard drinks of alcohol   Drug use: No     Allergies   Ivp dye [iodinated contrast media], Penicillins, and Lisinopril   Review of Systems Review of Systems Per HPI  Physical Exam Triage Vital Signs ED Triage Vitals  Enc Vitals Group     BP 09/17/22 1156 (!) 173/82     Pulse Rate 09/17/22 1156 70     Resp 09/17/22 1156 20     Temp 09/17/22 1156 (!) 97.5 F (36.4 C)     Temp Source 09/17/22 1156 Oral     SpO2 09/17/22 1156 94 %     Weight --      Height --      Head Circumference --      Peak Flow --      Pain Score 09/17/22 1158 0     Pain  Loc --      Pain Edu? --      Excl. in GC? --    No data found.  Updated Vital Signs BP (!) 173/82 (BP Location: Right Arm)   Pulse 74   Temp (!) 97.5 F (36.4 C) (Oral)   Resp 20   SpO2 96%   Visual Acuity Right Eye Distance:   Left Eye Distance:   Bilateral Distance:    Right Eye Near:   Left Eye Near:    Bilateral Near:     Physical Exam Vitals and nursing note reviewed.  Constitutional:      Appearance: He is well-developed.  HENT:     Head: Atraumatic.     Right Ear: External ear normal.     Left Ear: External ear normal.     Nose: Nose normal.     Mouth/Throat:     Mouth: Mucous membranes are moist.     Pharynx: Oropharynx is clear. No oropharyngeal exudate.  Eyes:     Conjunctiva/sclera: Conjunctivae normal.     Pupils: Pupils are equal, round, and reactive to light.  Cardiovascular:     Rate and Rhythm: Normal rate and regular rhythm.  Pulmonary:     Effort: Pulmonary effort is normal. No respiratory distress.     Breath sounds: Wheezing present. No rales.  Musculoskeletal:        General: Normal range of motion.     Cervical back: Normal range of motion and neck supple.  Lymphadenopathy:     Cervical: No cervical adenopathy.  Skin:    General: Skin is warm and dry.  Neurological:     Mental Status: He is alert and oriented to person, place, and time.  Psychiatric:        Behavior: Behavior normal.      UC Treatments /  Results  Labs (all labs ordered are listed, but only abnormal results are displayed) Labs Reviewed - No data to display  EKG   Radiology DG Chest 2 View  Result Date: 09/17/2022 CLINICAL DATA:  Worsening productive cough, wheezing EXAM: CHEST - 2 VIEW COMPARISON:  07/15/2022 FINDINGS: Cardiac and mediastinal contours are within normal limits. No focal pulmonary opacity. No pleural effusion or pneumothorax. No acute osseous abnormality. Degenerative changes in the shoulders. IMPRESSION: No acute cardiopulmonary process.  Electronically Signed   By: Wiliam Ke M.D.   On: 09/17/2022 13:05    Procedures Procedures (including critical care time)  Medications Ordered in UC Medications  ipratropium-albuterol (DUONEB) 0.5-2.5 (3) MG/3ML nebulizer solution 3 mL (3 mLs Nebulization Given 09/17/22 1302)    Initial Impression / Assessment and Plan / UC Course  I have reviewed the triage vital signs and the nursing notes.  Pertinent labs & imaging results that were available during my care of the patient were reviewed by me and considered in my medical decision making (see chart for details).     Hypertensive in triage, otherwise vital signs within normal limits.  Overall exam reassuring today apart from diffuse wheezes.  DuoNeb treatment in clinic significantly improved symptoms and lung sounds, chest x-ray negative for acute cardiopulmonary abnormality.  Suspect asthma exacerbation, treat with prednisone, refill albuterol inhaler and discuss possibly getting on maintenance asthma inhaler with PCP in the near future due to frequency and duration of exacerbation symptoms.  Final Clinical Impressions(s) / UC Diagnoses   Final diagnoses:  Moderate persistent asthma with acute exacerbation  Wheezing  SOB (shortness of breath)   Discharge Instructions   None    ED Prescriptions     Medication Sig Dispense Auth. Provider   predniSONE (DELTASONE) 20 MG tablet Take 2 tablets (40 mg total) by mouth daily with breakfast. 10 tablet Particia Nearing, PA-C   promethazine-dextromethorphan (PROMETHAZINE-DM) 6.25-15 MG/5ML syrup Take 5 mLs by mouth 4 (four) times daily as needed. 100 mL Particia Nearing, PA-C   albuterol (VENTOLIN HFA) 108 (90 Base) MCG/ACT inhaler Inhale 2 puffs into the lungs every 4 (four) hours as needed for wheezing or shortness of breath. 18 g Particia Nearing, [place]      PDMP not reviewed this encounter.   Particia Nearing, [place] 09/17/22 1349

## 2022-09-27 ENCOUNTER — Ambulatory Visit: Payer: Medicare PPO | Admitting: Family Medicine

## 2022-09-27 ENCOUNTER — Encounter: Payer: Self-pay | Admitting: Family Medicine

## 2022-09-27 VITALS — BP 126/62 | HR 60 | Temp 98.3°F | Wt 330.0 lb

## 2022-09-27 DIAGNOSIS — M25561 Pain in right knee: Secondary | ICD-10-CM | POA: Diagnosis not present

## 2022-09-27 DIAGNOSIS — L989 Disorder of the skin and subcutaneous tissue, unspecified: Secondary | ICD-10-CM | POA: Diagnosis not present

## 2022-09-27 DIAGNOSIS — G8929 Other chronic pain: Secondary | ICD-10-CM

## 2022-09-27 DIAGNOSIS — R0602 Shortness of breath: Secondary | ICD-10-CM | POA: Diagnosis not present

## 2022-09-27 LAB — CBC WITH DIFFERENTIAL/PLATELET
Basophils Absolute: 0.1 10*3/uL (ref 0.0–0.1)
Basophils Relative: 0.6 % (ref 0.0–3.0)
Eosinophils Absolute: 0.1 10*3/uL (ref 0.0–0.7)
Eosinophils Relative: 1 % (ref 0.0–5.0)
HCT: 46.5 % (ref 39.0–52.0)
Hemoglobin: 15 g/dL (ref 13.0–17.0)
Lymphocytes Relative: 23.6 % (ref 12.0–46.0)
Lymphs Abs: 2.9 10*3/uL (ref 0.7–4.0)
MCHC: 32.2 g/dL (ref 30.0–36.0)
MCV: 91.8 fl (ref 78.0–100.0)
Monocytes Absolute: 1.1 10*3/uL — ABNORMAL HIGH (ref 0.1–1.0)
Monocytes Relative: 9.1 % (ref 3.0–12.0)
Neutro Abs: 8 10*3/uL — ABNORMAL HIGH (ref 1.4–7.7)
Neutrophils Relative %: 65.7 % (ref 43.0–77.0)
Platelets: 226 10*3/uL (ref 150.0–400.0)
RBC: 5.07 Mil/uL (ref 4.22–5.81)
RDW: 14.8 % (ref 11.5–15.5)
WBC: 12.2 10*3/uL — ABNORMAL HIGH (ref 4.0–10.5)

## 2022-09-27 LAB — BRAIN NATRIURETIC PEPTIDE: Pro B Natriuretic peptide (BNP): 52 pg/mL (ref 0.0–100.0)

## 2022-09-27 MED ORDER — BUDESONIDE-FORMOTEROL FUMARATE 160-4.5 MCG/ACT IN AERO: 2.0000 | INHALATION_SPRAY | Freq: Two times a day (BID) | RESPIRATORY_TRACT | 5 refills | Status: DC

## 2022-09-27 NOTE — Progress Notes (Unsigned)
Subjective:    Patient ID: UNIQUE DARTY, male    DOB: Aug 29, 1940, 82 y.o.   MRN: [id number]  HPI Here to follow up on two urgent care visits for the same issue, one on 07-15-22 and the most recent on 09-17-22. Both times he presented with SOB, a non-productive cough, and wheezing. No fevers. CXR was clear both times. He was diagnosed with asthma both times, and was treated for this. On 09-17-22 he was given a nebulizer treatment with Duoneb, and he says it really helped his breathing for a few hours. Then the symptoms returned. He says he is SOB every day with wheezing and coughing. He uses his albuterol inhaler 2-3 times every day, and he gets brief relief with this. No chest pains. He has had LE edema for years, and he takes Lasix daily. A BMET on 07-15-22 showed normal kidney function. He had an ECHO on 11--30-18 showing an EF of 60-65% with LVH, and with Grade I diastolic dysfunction. Of note he was hospitalized last October for cough and SOB, and a chest CTA revealed a multifocal pneumonia. No follow up scan has been done. In addition he complains of chronic pain in the right knee, and he wants [place] to check a lesion on the back of his neck that appeared 2 years ago. It gets sore and bleeds at times. Lastly he complains of chronic pain in the right knee that started several years ago.    Review of Systems  Constitutional: Negative.   Respiratory:  Positive for cough, shortness of breath and wheezing.   Cardiovascular:  Positive for leg swelling. Negative for chest pain and palpitations.  Musculoskeletal:  Positive for arthralgias.       Objective:   Physical Exam Constitutional:      Appearance: He is obese.     Comments: In a wheelchair  Cardiovascular:     Rate and Rhythm: Normal rate and regular rhythm.     Pulses: Normal pulses.     Heart sounds: Normal heart sounds.  Pulmonary:     Effort: Pulmonary effort is normal.     Breath sounds: Wheezing and rhonchi present. No rales.   Abdominal:     General: Abdomen is flat. Bowel sounds are normal. There is no distension.     Palpations: Abdomen is soft. There is no mass.     Tenderness: There is no abdominal tenderness. There is no right CVA tenderness, guarding or rebound.     Hernia: No hernia is present.  Musculoskeletal:     Comments: 4+ edema in both lower legs   Skin:    Comments: There is a 8 mm crusted lesion on the back of his neck that is red and has a clear raised border  Neurological:     General: No focal deficit present.     Mental Status: He is alert and oriented to person, place, and time.           Assessment & Plan:  He has had 6 months of daily SOB, dry cough, and wheezing which is consistent with asthma. We will start him on Symbicort BID as a maintenance inhaler, and he will continue to use albuterol as needed for rescue. Due to his recent hx of a multifocal pneumonia, we will set up a contrasted chest CT soon. Depending on these results, we may consider referring him to Pulmonology. I do not think he has CHF, but we will get a BNP today and we will set  up another ECHO. For the right knee pain, we will refer him to Orthopedics. 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 We will refer him to Dermatology to remove this.  Gershon Crane, MD

## 2022-09-29 ENCOUNTER — Telehealth: Payer: Self-pay | Admitting: Family Medicine

## 2022-09-29 NOTE — Telephone Encounter (Signed)
Pt wife is calling and would like blood work results. Pt wife is not on DPR pt is [facility]

## 2022-09-30 ENCOUNTER — Telehealth: Payer: Self-pay | Admitting: Family Medicine

## 2022-09-30 NOTE — Telephone Encounter (Signed)
Pt wife is calling and ct chest scan needs to be done at hosp due to pt is allergic to IVP dye

## 2022-10-01 NOTE — Addendum Note (Signed)
Addended by: Gershon Crane A on: 10/01/2022 03:53 PM   Modules accepted: Orders

## 2022-10-01 NOTE — Telephone Encounter (Signed)
Message sent to pt PCP for advise 

## 2022-10-01 NOTE — Telephone Encounter (Signed)
Spouse said MD would call her today and they are still waiting?

## 2022-10-01 NOTE — Telephone Encounter (Signed)
Because this needs a special procedure due to his IV dye allergy, I had to cancel the original chest CT order. I have ordered this again to be done at [facility]

## 2022-10-05 NOTE — Telephone Encounter (Signed)
Spoke with pt and spouse advised of Dr Clent Ridges recommendation, verbalized understanding

## 2022-10-22 ENCOUNTER — Other Ambulatory Visit (HOSPITAL_COMMUNITY): Payer: Medicare PPO

## 2022-10-26 DIAGNOSIS — H02834 Dermatochalasis of left upper eyelid: Secondary | ICD-10-CM | POA: Diagnosis not present

## 2022-10-26 DIAGNOSIS — H02831 Dermatochalasis of right upper eyelid: Secondary | ICD-10-CM | POA: Diagnosis not present

## 2022-10-26 DIAGNOSIS — Z961 Presence of intraocular lens: Secondary | ICD-10-CM | POA: Diagnosis not present

## 2022-11-01 ENCOUNTER — Ambulatory Visit (HOSPITAL_COMMUNITY): Payer: Medicare PPO | Attending: Cardiovascular Disease

## 2022-11-01 DIAGNOSIS — R0602 Shortness of breath: Secondary | ICD-10-CM | POA: Diagnosis not present

## 2022-11-01 LAB — ECHOCARDIOGRAM COMPLETE
Area-P 1/2: 4.06 cm2
S' Lateral: 3.1 cm

## 2022-11-08 ENCOUNTER — Ambulatory Visit: Payer: Medicare PPO | Admitting: Podiatry

## 2022-11-08 DIAGNOSIS — M79675 Pain in left toe(s): Secondary | ICD-10-CM

## 2022-11-08 DIAGNOSIS — L03115 Cellulitis of right lower limb: Secondary | ICD-10-CM

## 2022-11-08 DIAGNOSIS — I739 Peripheral vascular disease, unspecified: Secondary | ICD-10-CM

## 2022-11-08 DIAGNOSIS — B351 Tinea unguium: Secondary | ICD-10-CM

## 2022-11-08 DIAGNOSIS — M79674 Pain in right toe(s): Secondary | ICD-10-CM | POA: Diagnosis not present

## 2022-11-08 MED ORDER — DOXYCYCLINE HYCLATE 100 MG PO CAPS: 100.0000 mg | ORAL_CAPSULE | Freq: Two times a day (BID) | ORAL | 0 refills | Status: AC

## 2022-11-08 NOTE — Progress Notes (Unsigned)
Subjective:  Patient ID: Gabriel Love, male    DOB: 18-Jul-1940,  MRN: [id number]  Gabriel Love presents to clinic today for:  Chief Complaint  Patient presents with   Nail Problem    [facility] PCP-Dr. Gershon Crane, MD PCP VST- 09/27/22  Painful toenails both feet     Patient notes nails are thick and elongated, causing pain in shoe gear when ambulating.  And notes that he has issues with swelling in the legs.  He and his wife feel that the right leg is more red and tender than the left and they are getting concerned about it.  They have not seen any other specialist for this yet.  PCP is Nelwyn Salisbury, MD. - last seen 09/27/2022  Past Medical History:  Diagnosis Date   Abdominal hernia    Allergy    Blood transfusion without reported diagnosis 12/2006   At [facility] with hip replacement - 2 units transfused   Cancer (HCC)    skin cancers   Cataract    Depression    DJD (degenerative joint disease)    shoulders bilateral   Environmental allergies    Full dentures    Grade I diastolic dysfunction 05/08/2020   Hearing loss    bilateral hearing aids   History of colonic polyps    History of urinary tract infection    Hyperlipidemia    under control   Hypertension    "borderline"   Nonspecific abnormal finding in stool contents    Other general symptoms(780.99)    Prostate hypertrophy    sees Dr. Patsi Sears    Urinary leakage     Allergies  Allergen Reactions   Ivp Dye [Iodinated Contrast Media] Anaphylaxis    Iv dye allergy in 1985, anaphylaxix//a.calhoun- "shocked back to life"   Penicillins Other (See Comments)    Urticaria Has patient had a PCN reaction causing immediate rash, facial/tongue/throat swelling, SOB or lightheadedness with hypotension: no Has patient had a PCN reaction causing severe rash involving mucus membranes or skin necrosis: no Has patient had a PCN reaction that required hospitalization no Has patient had a PCN reaction occurring within the last 10  years: yes If all of the above answers are "NO", then may proceed with Cephalosporin use.    Lisinopril Cough   Review of Systems: Negative except as noted in the HPI.  Objective:  There were no vitals filed for this visit.  Gabriel Love is a pleasant 82 y.o. male in NAD. AAO x 3.  Vascular Examination: Patient has palpable DP pulse, absent PT pulse bilateral.  Delayed capillary refill bilateral toes.  Sparse digital hair bilateral.  Proximal to distal cooling WNL bilateral.  Mild +1 pitting edema, with the right leg slightly more swollen than the left.  No pain with calf compression  Dermatological Examination: Interspaces are clear with no open lesions noted bilateral.  Skin is shiny and atrophic bilateral.  Nails are 3-34mm thick, with yellowish/brown discoloration, subungual debris and distal onycholysis x10.  There is pain with compression of nails x10.  There is erythema and slight calor noted to the right lower one third aspect of the leg.  No open or draining lesions are noted.  Patient qualifies for at-risk foot care because of PVD.  Assessment/Plan: 1. Cellulitis of leg, right   2. PVD (peripheral vascular disease) (HCC)   3. Pain due to onychomycosis of toenails of both feet     Meds ordered this encounter  Medications  doxycycline (VIBRAMYCIN) 100 MG capsule    Sig: Take 1 capsule (100 mg total) by mouth 2 (two) times daily for 7 days.    Dispense:  14 capsule    Refill:  0   Mycotic nails x10 were sharply debrided with sterile nail nippers and power debriding burr to decrease bulk and length.  Prescription for doxycycline 100 mg, 1 tab p.o. twice daily x 7 days was sent to his pharmacy to treat the cellulitis on the right leg.  He was instructed to follow-up with his PCP for recheck on this within the next 10 days.  Return in about 3 months (around 02/08/2023) for RFC.   Clerance Lav, DPM, FACFAS [facility]     [address] [place], [place] [address]                Office [phone]  Fax [phone]

## 2022-12-22 ENCOUNTER — Ambulatory Visit: Payer: Medicare PPO

## 2022-12-22 DIAGNOSIS — C44629 Squamous cell carcinoma of skin of left upper limb, including shoulder: Secondary | ICD-10-CM | POA: Diagnosis not present

## 2022-12-22 DIAGNOSIS — C4441 Basal cell carcinoma of skin of scalp and neck: Secondary | ICD-10-CM | POA: Diagnosis not present

## 2022-12-22 DIAGNOSIS — D485 Neoplasm of uncertain behavior of skin: Secondary | ICD-10-CM | POA: Diagnosis not present

## 2022-12-22 DIAGNOSIS — L570 Actinic keratosis: Secondary | ICD-10-CM | POA: Diagnosis not present

## 2022-12-23 ENCOUNTER — Other Ambulatory Visit: Payer: Self-pay | Admitting: Family Medicine

## 2022-12-23 DIAGNOSIS — E785 Hyperlipidemia, unspecified: Secondary | ICD-10-CM

## 2023-01-12 ENCOUNTER — Other Ambulatory Visit: Payer: Self-pay | Admitting: Family Medicine

## 2023-01-17 DIAGNOSIS — C4441 Basal cell carcinoma of skin of scalp and neck: Secondary | ICD-10-CM | POA: Diagnosis not present

## 2023-01-17 DIAGNOSIS — L905 Scar conditions and fibrosis of skin: Secondary | ICD-10-CM | POA: Diagnosis not present

## 2023-01-28 ENCOUNTER — Emergency Department (HOSPITAL_COMMUNITY)
Admission: EM | Admit: 2023-01-28 | Discharge: 2023-01-28 | Disposition: A | Discharge status: Home / Self Care | Payer: Medicare PPO | Attending: Student | Admitting: Student

## 2023-01-28 ENCOUNTER — Other Ambulatory Visit: Payer: Self-pay

## 2023-01-28 ENCOUNTER — Encounter (HOSPITAL_COMMUNITY): Payer: Self-pay

## 2023-01-28 ENCOUNTER — Emergency Department (HOSPITAL_COMMUNITY): Payer: Medicare PPO

## 2023-01-28 ENCOUNTER — Ambulatory Visit
Admission: EM | Admit: 2023-01-28 | Discharge: 2023-01-28 | Disposition: A | Discharge status: Home / Self Care | Payer: Medicare PPO | Attending: Nurse Practitioner | Admitting: Nurse Practitioner

## 2023-01-28 DIAGNOSIS — I959 Hypotension, unspecified: Secondary | ICD-10-CM | POA: Diagnosis not present

## 2023-01-28 DIAGNOSIS — R059 Cough, unspecified: Secondary | ICD-10-CM | POA: Diagnosis not present

## 2023-01-28 DIAGNOSIS — M7989 Other specified soft tissue disorders: Secondary | ICD-10-CM | POA: Insufficient documentation

## 2023-01-28 DIAGNOSIS — Z720 Tobacco use: Secondary | ICD-10-CM | POA: Diagnosis not present

## 2023-01-28 DIAGNOSIS — R0682 Tachypnea, not elsewhere classified: Secondary | ICD-10-CM

## 2023-01-28 DIAGNOSIS — Z20822 Contact with and (suspected) exposure to covid-19: Secondary | ICD-10-CM | POA: Insufficient documentation

## 2023-01-28 DIAGNOSIS — Z7982 Long term (current) use of aspirin: Secondary | ICD-10-CM | POA: Insufficient documentation

## 2023-01-28 DIAGNOSIS — J9601 Acute respiratory failure with hypoxia: Secondary | ICD-10-CM

## 2023-01-28 DIAGNOSIS — R0602 Shortness of breath: Secondary | ICD-10-CM | POA: Diagnosis not present

## 2023-01-28 DIAGNOSIS — I509 Heart failure, unspecified: Secondary | ICD-10-CM | POA: Insufficient documentation

## 2023-01-28 LAB — CBC WITH DIFFERENTIAL/PLATELET
Abs Immature Granulocytes: 0.04 10*3/uL (ref 0.00–0.07)
Basophils Absolute: 0.1 10*3/uL (ref 0.0–0.1)
Basophils Relative: 1 %
Eosinophils Absolute: 0.1 10*3/uL (ref 0.0–0.5)
Eosinophils Relative: 1 %
HCT: 47.1 % (ref 39.0–52.0)
Hemoglobin: 14.8 g/dL (ref 13.0–17.0)
Immature Granulocytes: 0 %
Lymphocytes Relative: 16 %
Lymphs Abs: 1.5 10*3/uL (ref 0.7–4.0)
MCH: 29.4 pg (ref 26.0–34.0)
MCHC: 31.4 g/dL (ref 30.0–36.0)
MCV: 93.6 fL (ref 80.0–100.0)
Monocytes Absolute: 1 10*3/uL (ref 0.1–1.0)
Monocytes Relative: 10 %
Neutro Abs: 6.5 10*3/uL (ref 1.7–7.7)
Neutrophils Relative %: 72 %
Platelets: 185 10*3/uL (ref 150–400)
RBC: 5.03 MIL/uL (ref 4.22–5.81)
RDW: 13.7 % (ref 11.5–15.5)
WBC: 9.1 10*3/uL (ref 4.0–10.5)
nRBC: 0 % (ref 0.0–0.2)

## 2023-01-28 LAB — RESP PANEL BY RT-PCR (RSV, FLU A&B, COVID)  RVPGX2
Influenza A by PCR: NEGATIVE
Influenza B by PCR: NEGATIVE
Resp Syncytial Virus by PCR: NEGATIVE
SARS Coronavirus 2 by RT PCR: NEGATIVE

## 2023-01-28 LAB — BRAIN NATRIURETIC PEPTIDE: B Natriuretic Peptide: 85 pg/mL (ref 0.0–100.0)

## 2023-01-28 LAB — BASIC METABOLIC PANEL
Anion gap: 8 (ref 5–15)
BUN: 21 mg/dL (ref 8–23)
CO2: 33 mmol/L — ABNORMAL HIGH (ref 22–32)
Calcium: 8.9 mg/dL (ref 8.9–10.3)
Chloride: 102 mmol/L (ref 98–111)
Creatinine, Ser: 0.89 mg/dL (ref 0.61–1.24)
GFR, Estimated: >60 mL/min (ref >60)
Glucose, Bld: 104 mg/dL — ABNORMAL HIGH (ref 70–99)
Potassium: 4.2 mmol/L (ref 3.5–5.1)
Sodium: 143 mmol/L (ref 135–145)

## 2023-01-28 MED ORDER — ALBUTEROL SULFATE (2.5 MG/3ML) 0.083% IN NEBU
7.5000 mg/h | INHALATION_SOLUTION | Freq: Once | RESPIRATORY_TRACT | Status: DC
Start: 2023-01-28 — End: 2023-01-28
  Filled 2023-01-28: qty 9

## 2023-01-28 MED ORDER — METHYLPREDNISOLONE SODIUM SUCC 125 MG IJ SOLR
125.0000 mg | Freq: Once | INTRAMUSCULAR | Status: AC
Start: 2023-01-28 — End: 2023-01-28
  Administered 2023-01-28: 125 mg via INTRAVENOUS
  Filled 2023-01-28: qty 2

## 2023-01-28 MED ORDER — IPRATROPIUM-ALBUTEROL 0.5-2.5 (3) MG/3ML IN SOLN
3.0000 mL | Freq: Once | RESPIRATORY_TRACT | Status: AC
  Administered 2023-01-28: 3 mL via RESPIRATORY_TRACT

## 2023-01-28 MED ORDER — AZITHROMYCIN 250 MG PO TABS
500.0000 mg | ORAL_TABLET | Freq: Once | ORAL | Status: AC
  Administered 2023-01-28: 500 mg via ORAL
  Filled 2023-01-28: qty 2

## 2023-01-28 MED ORDER — ALBUTEROL SULFATE HFA 108 (90 BASE) MCG/ACT IN AERS
1.0000 | INHALATION_SPRAY | Freq: Once | RESPIRATORY_TRACT | Status: AC
  Administered 2023-01-28: 1 via RESPIRATORY_TRACT
  Filled 2023-01-28: qty 6.7

## 2023-01-28 MED ORDER — PREDNISONE 10 MG PO TABS: 40.0000 mg | ORAL_TABLET | Freq: Every day | ORAL | 0 refills | Status: AC

## 2023-01-28 MED ORDER — AZITHROMYCIN 250 MG PO TABS: 250.0000 mg | ORAL_TABLET | Freq: Every day | ORAL | 0 refills | Status: DC

## 2023-01-28 MED ORDER — ALBUTEROL SULFATE (2.5 MG/3ML) 0.083% IN NEBU
2.5000 mg | INHALATION_SOLUTION | Freq: Once | RESPIRATORY_TRACT | Status: AC
  Administered 2023-01-28: 2.5 mg via RESPIRATORY_TRACT

## 2023-01-28 MED ORDER — IPRATROPIUM BROMIDE 0.02 % IN SOLN
0.5000 mg | Freq: Once | RESPIRATORY_TRACT | Status: AC
  Administered 2023-01-28: 0.5 mg via RESPIRATORY_TRACT
  Filled 2023-01-28: qty 2.5

## 2023-01-28 MED ORDER — ALBUTEROL SULFATE HFA 108 (90 BASE) MCG/ACT IN AERS
1.0000 | INHALATION_SPRAY | Freq: Four times a day (QID) | RESPIRATORY_TRACT | 5 refills | Status: DC | PRN
Start: 2023-01-28 — End: 2023-03-21

## 2023-01-28 NOTE — ED Notes (Signed)
Pt en route to [facility] ED. Report given to EMS.

## 2023-01-28 NOTE — ED Triage Notes (Signed)
Pt reports he is SOB and upper abdominal pain x 1 week.

## 2023-01-28 NOTE — Discharge Instructions (Signed)
Evaluation today was overall reassuring.  I do recommend you follow-up with a pulmonologist.  I have sent azithromycin which is an antibiotic and prednisone to your pharmacy.  Also sent an albuterol inhaler to your pharmacy as well with refills if you need them.  If you have worsening shortness of breath, chest pain, cough with fever or any other concerning symptom please return emergency department further evaluation.

## 2023-01-28 NOTE — ED Provider Notes (Signed)
[place] EMERGENCY DEPARTMENT AT [facility] Provider Note   CSN: [id number] Arrival date & time: 01/28/23  1240     History  Chief Complaint  Patient presents with   Shortness of Breath   HPI Gabriel Love is a 82 y.o. 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  Symptoms started a week ago with a dry cough.  Symptoms are worse with exertion and cold temperatures.  Denies fever.  Denies chest pain.  Was seen earlier today by urgent care was found to have O2 sats in the high 80s.  Was placed on 2 L and sats improved to the low 90s.  Sent here for further evaluation.  Patient does endorse chronic swelling of both legs but states over the last week swelling has been improved overall.  States he "chews tobacco" but has not smoked in over 50 years.   Shortness of Breath      Home Medications Prior to Admission medications   Medication Sig Start Date End Date Taking? Authorizing Provider  albuterol (VENTOLIN HFA) 108 (90 Base) MCG/ACT inhaler Inhale 1-2 puffs into the lungs every 6 (six) hours as needed for wheezing or shortness of breath. 01/28/23  Yes Gareth Eagle, PA-C  azithromycin (ZITHROMAX) 250 MG tablet Take 1 tablet (250 mg total) by mouth daily. Take first 2 tablets together, then 1 every day until finished. 01/28/23  Yes Gareth Eagle, PA-C  predniSONE (DELTASONE) 10 MG tablet Take 4 tablets (40 mg total) by mouth daily for 5 days. 01/28/23 02/02/23 Yes Gareth Eagle, PA-C  aspirin EC 81 MG tablet Take 81 mg by mouth daily.    [provider]  atorvastatin (LIPITOR) 40 MG tablet TAKE 1 TABLET BY MOUTH DAILY 12/28/22   Nelwyn Salisbury, MD  budesonide-formoterol [facility]) 160-4.5 MCG/ACT inhaler Inhale 2 puffs into the lungs in the morning and at bedtime. 09/27/22   Nelwyn Salisbury, MD  Cholecalciferol (VITAMIN D3 PO) Take 1 tablet by mouth daily at 12 noon.    [provider]  escitalopram (LEXAPRO) 20 MG tablet TAKE 1 TABLET BY MOUTH EVERY DAY 01/13/23   Nelwyn Salisbury, MD  furosemide (LASIX) 40 MG tablet TAKE 1 TABLET(40 MG) BY MOUTH TWICE DAILY 03/17/22   Nelwyn Salisbury, MD  metoprolol succinate (TOPROL-XL) 50 MG 24 hr tablet TAKE 1 TABLET BY MOUTH EVERY DAY 08/10/22   Nelwyn Salisbury, MD  Multiple Vitamins-Minerals (CENTRUM ADULTS PO) Take 1 tablet by mouth daily.    [provider]  mupirocin cream (BACTROBAN) 2 % Apply 1 application topically 2 (two) times daily. Patient taking differently: Apply 1 application  topically daily as needed (for irritation). 03/31/21   Nelwyn Salisbury, MD  potassium chloride SA (KLOR-CON M) 20 MEQ tablet TAKE 1 TABLET(20 MEQ) BY MOUTH DAILY Patient taking differently: Take 20 mEq by mouth daily. 11/27/21   Nelwyn Salisbury, MD  promethazine-dextromethorphan (PROMETHAZINE-DM) 6.25-15 MG/5ML syrup Take 5 mLs by mouth 4 (four) times daily as needed. Patient not taking: Reported on 09/27/2022 04/17/22   Particia Nearing, PA-C  VITAMIN E PO Take 1 capsule by mouth daily at 12 noon.    [provider]      Allergies    Ivp dye [iodinated contrast media], Penicillins, and Lisinopril    Review of Systems   Review of Systems  Respiratory:  Positive for shortness of breath.  Physical Exam   Vitals:   01/28/23 1606 01/28/23 1611  BP: (!) 157/80   Pulse: 66   Resp:    Temp:    SpO2:  92%    CONSTITUTIONAL:  well-appearing, NAD NEURO:  Alert and oriented x 3, CN 3-12 grossly intact EYES:  eyes equal and reactive ENT/NECK:  Supple, no stridor  CARDIO:  regular rate and rhythm, appears well-perfused  PULM:  No respiratory distress, deiffuse course sounds, no wheezing or rales GI/GU:  non-distended, soft MSK/SPINE:  No gross deformities, no edema, moves all extremities  SKIN:  no rash, atraumatic  *Additional and/or pertinent findings included in MDM below   ED Results / Procedures /  Treatments   Labs (all labs ordered are listed, but only abnormal results are displayed) Labs Reviewed  BASIC METABOLIC PANEL - Abnormal; Notable for the following components:      Result Value   CO2 33 (*)    Glucose, Bld 104 (*)    All other components within normal limits  RESP PANEL BY RT-PCR (RSV, FLU A&B, COVID)  [id number]  CBC WITH DIFFERENTIAL/PLATELET  BRAIN NATRIURETIC PEPTIDE    EKG None  Radiology DG Chest Port 1 View  Result Date: 01/28/2023 CLINICAL DATA:  Shortness of breath. EXAM: PORTABLE CHEST 1 VIEW COMPARISON:  September 17, 2022. FINDINGS: The heart size and mediastinal contours are within normal limits. Both lungs are clear. The visualized skeletal structures are unremarkable. IMPRESSION: No active disease. Electronically Signed   By: Lupita Raider M.D.   On: 01/28/2023 16:16    Procedures Procedures    Medications Ordered in ED Medications  azithromycin (ZITHROMAX) tablet 500 mg (has no administration in time range)  albuterol (VENTOLIN HFA) 108 (90 Base) MCG/ACT inhaler 1 puff (has no administration in time range)  methylPREDNISolone sodium succinate (SOLU-MEDROL) 125 mg/2 mL injection 125 mg (125 mg Intravenous Given 01/28/23 1403)  ipratropium (ATROVENT) nebulizer solution 0.5 mg (0.5 mg Nebulization Given 01/28/23 1401)  albuterol (PROVENTIL) (2.5 MG/3ML) 0.083% nebulizer solution 2.5 mg (2.5 mg Nebulization Given 01/28/23 1401)    ED Course/ Medical Decision Making/ A&P                                 Medical Decision Making Amount and/or Complexity of Data Reviewed Labs: ordered. Radiology: ordered.  Risk Prescription drug management.   Initial Impression and Ddx 82 year old well-appearing male presenting for shortness of breath.  Exam notable for coarse breath sounds but otherwise reassuring.  DDx includes COPD versus asthma exacerbation, CHF, ACS, PE, pneumonia, other. 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  Patient Reassessment and Ultimate Disposition/Management On reassessment patient stated that symptoms had improved significantly.  Able to maintain O2 sats on room air without evidence of respiratory distress.  Ambulated patient around department and he stated that he "felt great", without shortness of breath and O2 sats were 93 to 94% which appears to be near his baseline per chart review.  Suspect mild COPD versus asthma exacerbation did start him  on azithromycin, sent albuterol and prednisone to his pharmacy.  Advised him to follow-up with pulmonology.  Discussed return precautions.  Vital stable discharge in good condition.  Patient management required discussion with the following services or consulting groups:  None  Complexity of Problems Addressed Acute complicated illness or Injury  Additional Data Reviewed and Analyzed Further history obtained from: Further history from spouse/family member, Past medical history and medications listed in the EMR, and Prior ED visit notes  Patient Encounter Risk Assessment Prescriptions         Final Clinical Impression(s) / ED Diagnoses Final diagnoses:  SOB (shortness of breath)    Rx / DC Orders ED Discharge Orders          Ordered    predniSONE (DELTASONE) 10 MG tablet  Daily        01/28/23 1649    albuterol (VENTOLIN HFA) 108 (90 Base) MCG/ACT inhaler  Every 6 hours PRN        01/28/23 1654    azithromycin (ZITHROMAX) 250 MG tablet  Daily        01/28/23 1655              Gareth Eagle, PA-C 01/28/23 1659    Kommor, Wyn Forster, MD 01/28/23 1720

## 2023-01-28 NOTE — ED Notes (Signed)
Pt ambulated in hallway and maintained O2 sats of 90 to 93% with no signs of distress and no complaints.

## 2023-01-28 NOTE — ED Notes (Signed)
Pt 86% on 1 liter O2. Increased to 2 liters. Pt 96%. Per provider verbal order, if 02 greater than 95% titrate pt o2 back down to 1 liter [place]. Pt maintaining 92-94% on 1 liter [place] at this time. NP aware.

## 2023-01-28 NOTE — ED Provider Notes (Signed)
[facility]    CSN: [id number] Arrival date & time: 01/28/23  1140      History   Chief Complaint No chief complaint on file.   HPI Gabriel Love is a 82 y.o. male.   Patient presents today with wife for 1 week history of congested cough, shortness of breath, wheezing, chest congestion, and fatigue.  He also endorses weakness and states "I feel like I am going to die."  Reports all symptoms have been ongoing for the past week and he has mentioned numerous times how he feels like he is going to die over the past week.  Has used albuterol inhaler last night without much improvement.  No fevers, chest pain, runny or stuffy nose, sore throat, headache, ear pain, change in appetite, or change in behavior per wife's report.    Past Medical History:  Diagnosis Date   Abdominal hernia    Allergy    Blood transfusion without reported diagnosis 12/2006   At [facility] with hip replacement - 2 units transfused   Cancer (HCC)    skin cancers   Cataract    Depression    DJD (degenerative joint disease)    shoulders bilateral   Environmental allergies    Full dentures    Grade I diastolic dysfunction 05/08/2020   Hearing loss    bilateral hearing aids   History of colonic polyps    History of urinary tract infection    Hyperlipidemia    under control   Hypertension    "borderline"   Nonspecific abnormal finding in stool contents    Other general symptoms(780.99)    Prostate hypertrophy    sees Dr. Patsi Sears    Urinary leakage     Patient Active Problem List   Diagnosis Date Noted   Pain due to onychomycosis of toenails of both feet 04/27/2022   Multifocal pneumonia 01/02/2022   Asthma exacerbation 01/02/2022   Acute gastroenteritis 05/09/2020   Sepsis secondary to UTI (HCC) 05/08/2020   Grade I diastolic dysfunction 05/08/2020   Class 1 obesity 05/08/2020   Hyponatremia 05/08/2020   Hypokalemia 05/08/2020   Hypoalbuminemia 05/08/2020   Mild protein malnutrition  (HCC) 05/08/2020   Depression with anxiety 07/24/2019   Morbid obesity (HCC) 04/04/2018   Bilateral leg edema 02/07/2017   Osteoarthritis 01/28/2017   Urge and stress incontinence 01/28/2017   History of colonic polyps    Colon polyp 08/07/2013   Benign neoplasm of ileocecal valve, 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   Nonspecific abnormal finding in stool contents 05/29/2013   Atherosclerotic peripheral vascular disease with intermittent claudication (HCC) 06/14/2012   Spinal stenosis of lumbar region 06/14/2012   DEGENERATIVE DISC DISEASE, 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   Hyperlipidemia 11/23/2006   Essential hypertension 11/23/2006   HEMATURIA 11/23/2006   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    Past Surgical History:  Procedure Laterality Date   CATARACT EXTRACTION W/PHACO Right 03/25/2014   Procedure: CATARACT EXTRACTION PHACO AND INTRAOCULAR LENS PLACEMENT; CDE:9.76;  Surgeon: Susa Simmonds, MD;  Location: [facility];  Service: Ophthalmology;  Laterality: Right;   CATARACT EXTRACTION W/PHACO Left 06/24/2014   Procedure: CATARACT EXTRACTION PHACO AND INTRAOCULAR LENS PLACEMENT (IOC);  Surgeon: Susa Simmonds, MD;  Location: [facility];  Service: Ophthalmology;  Laterality: Left;  CDE:8.45   CIRCUMCISION N/A 03/26/2016   Procedure: CIRCUMCISION ADULT;  Surgeon: Jethro Bolus, MD;  Location: [facility];  Service: Urology;  Laterality: N/AAlycia Patten, NON-NEWBORN  1985   COLONOSCOPY  06-19-13, 03/05/15  per Dr. Arlyce Dice, mass at the ileocecal valve plus several polyps, pathology benign, repeat in one year, polyp 2016    COLONOSCOPY WITH PROPOFOL N/A 07/29/2015   Procedure: COLONOSCOPY WITH PROPOFOL;  Surgeon: Ruffin Frederick, MD;  Location: [facility];  Service: Gastroenterology;  Laterality: N/A;   JOINT REPLACEMENT  12/2006   total left hip per Dr. Eulah Pont    LAPAROSCOPIC PARTIAL COLECTOMY N/A 08/07/2013   Procedure: LAPAROSCOPIC right PARTIAL COLECTOMY;  Surgeon: Romie Levee, MD;   Location: [facility];  Service: General;  Laterality: N/A;   MULTIPLE TOOTH EXTRACTIONS     PROSTATE SURGERY  03-29-11   had CTT per Dr. Patsi Sears, in office       Home Medications    Prior to Admission medications   Medication Sig Start Date End Date Taking? Authorizing Provider  albuterol (VENTOLIN HFA) 108 (90 Base) MCG/ACT inhaler Inhale 2 puffs into the lungs every 4 (four) hours as needed for wheezing or shortness of breath. 09/17/22   Particia Nearing, PA-C  aspirin EC 81 MG tablet Take 81 mg by mouth daily.    [provider]  atorvastatin (LIPITOR) 40 MG tablet TAKE 1 TABLET BY MOUTH DAILY 12/28/22   Nelwyn Salisbury, MD  budesonide-formoterol [facility]) 160-4.5 MCG/ACT inhaler Inhale 2 puffs into the lungs in the morning and at bedtime. 09/27/22   Nelwyn Salisbury, MD  Cholecalciferol (VITAMIN D3 PO) Take 1 tablet by mouth daily at 12 noon.    [provider]  escitalopram (LEXAPRO) 20 MG tablet TAKE 1 TABLET BY MOUTH EVERY DAY 01/13/23   Nelwyn Salisbury, MD  furosemide (LASIX) 40 MG tablet TAKE 1 TABLET(40 MG) BY MOUTH TWICE DAILY 03/17/22   Nelwyn Salisbury, MD  metoprolol succinate (TOPROL-XL) 50 MG 24 hr tablet TAKE 1 TABLET BY MOUTH EVERY DAY 08/10/22   Nelwyn Salisbury, MD  Multiple Vitamins-Minerals (CENTRUM ADULTS PO) Take 1 tablet by mouth daily.    [provider]  mupirocin cream (BACTROBAN) 2 % Apply 1 application topically 2 (two) times daily. Patient taking differently: Apply 1 application  topically daily as needed (for irritation). 03/31/21   Nelwyn Salisbury, MD  potassium chloride SA (KLOR-CON M) 20 MEQ tablet TAKE 1 TABLET(20 MEQ) BY MOUTH DAILY Patient taking differently: Take 20 mEq by mouth daily. 11/27/21   Nelwyn Salisbury, MD  promethazine-dextromethorphan (PROMETHAZINE-DM) 6.25-15 MG/5ML syrup Take 5 mLs by mouth 4 (four) times daily as needed. Patient not taking: Reported on 09/27/2022 04/17/22   Particia Nearing, PA-C  VITAMIN E PO Take 1  capsule by mouth daily at 12 noon.    [provider]    Family History Family History  Problem Relation Age of Onset   Hypertension Mother    Diabetes Mother    Heart failure Mother        cad/mi with rupture ventricle   Hyperlipidemia Father    Colon polyps Father    Cancer Neg Hx        colon or prostate   Colon cancer Neg Hx    Esophageal cancer Neg Hx    Rectal cancer Neg Hx    Stomach cancer Neg Hx     Social History Social History   Tobacco Use   Smoking status: Former    Current packs/day: 0.00    Average packs/day: 1 pack/day for 5.0 years (5.0 ttl pk-yrs)    Types: Cigarettes    Start date: 03/23/1963    Quit date:  03/22/1968    Years since quitting: 54.8   Smokeless tobacco: Former    Types: Chew    Quit date: 09/20/2015   Tobacco comments:    occassionally  Substance Use Topics   Alcohol use: No    Alcohol/week: 0.0 standard drinks of alcohol   Drug use: No     Allergies   Ivp dye [iodinated contrast media], Penicillins, and Lisinopril   Review of Systems Review of Systems Per HPI  Physical Exam Triage Vital Signs ED Triage Vitals  Encounter Vitals Group     BP 01/28/23 1144 (!) 184/88     Systolic BP Percentile --      Diastolic BP Percentile --      Pulse Rate 01/28/23 1144 95     Resp 01/28/23 1144 (!) 22     Temp --      Temp Source 01/28/23 1144 Oral     SpO2 01/28/23 1144 93 %     Weight --      Height --      Head Circumference --      Peak Flow --      Pain Score 01/28/23 1148 0     Pain Loc --      Pain Education --      Exclude from Growth Chart --    No data found.  Updated Vital Signs BP (!) 184/88 (BP Location: Right Arm)   Pulse 95   Resp (!) 22   SpO2 93%   Visual Acuity Right Eye Distance:   Left Eye Distance:   Bilateral Distance:    Right Eye Near:   Left Eye Near:    Bilateral Near:     Physical Exam   UC Treatments / Results  Labs (all labs ordered are listed, but only abnormal results are  displayed) Labs Reviewed - No data to display  EKG   Radiology No results found.  Procedures Procedures (including critical care time)  Medications Ordered in UC Medications  ipratropium-albuterol (DUONEB) 0.5-2.5 (3) MG/3ML nebulizer solution 3 mL (3 mLs Nebulization Given 01/28/23 1150)    Initial Impression / Assessment and Plan / UC Course  I have reviewed the triage vital signs and the nursing notes.  Pertinent labs & imaging results that were available during my care of the patient were reviewed by me and considered in my medical decision making (see chart for details).   Initially in triage, patient is ill-appearing, tachypneic, SpO2 is 89% on room air.  Patient is afebrile and not tachycardic.  1. Acute respiratory failure with hypoxia (HCC) DuoNeb performed with initial improvement up to 95% on room air, however upon reassessment at rest, SpO2 89% room air Patient placed on 1 L of oxygen by nasal cannula, SpO2 increased to 92% on room air EKG is reassuring without ST segment or T wave changes from previous EKG Given ongoing hypoxia after DuoNeb, I recommended further evaluation and management in emergency room Recommended transportation by EMS and patient and wife are in agreement IV initiated, patient placed on 1 L of oxygen by nasal cannula EMS called Patient left urgent care in stable condition  The patient was given the opportunity to ask questions.  All questions answered to their satisfaction.  The patient is in agreement to this plan.    Final Clinical Impressions(s) / UC Diagnoses   Final diagnoses:  Acute respiratory failure with hypoxia (HCC)  Tachypnea   Discharge Instructions   None    ED Prescriptions  None    PDMP not reviewed this encounter.   Valentino Nose, NP 01/28/23 1230

## 2023-01-28 NOTE — ED Triage Notes (Signed)
Pt reports cough and shortness of breath x 1 week and sent here from UC.

## 2023-01-28 NOTE — ED Notes (Signed)
Report called to [facility] ED.

## 2023-01-28 NOTE — ED Notes (Signed)
Patient is being discharged from the Urgent Care and sent to the Emergency Department via POV . Per provider, patient is in need of higher level of care due to hypoxia and shortness of breath. Patient is aware and verbalizes understanding of plan of care.  Vitals:   01/28/23 1144 01/28/23 1159  BP: (!) 184/88   Pulse: 95   Resp: (!) 22   SpO2: 93% (S) 95%

## 2023-01-28 NOTE — ED Notes (Signed)
EMS notified an en route.

## 2023-02-10 ENCOUNTER — Other Ambulatory Visit: Payer: Self-pay | Admitting: Family Medicine

## 2023-02-11 ENCOUNTER — Telehealth: Payer: Self-pay

## 2023-02-11 NOTE — Telephone Encounter (Signed)
Transition Care Management Follow-up Telephone Call Date of discharge and from where: 01/28/2023 [facility] How have you been since you were released from the hospital? Patient stated he is feeling better. Any questions or concerns? No  Items Reviewed: Did the pt receive and understand the discharge instructions provided? Yes  Medications obtained and verified? Yes  Other? No  Any new allergies since your discharge? No  Dietary orders reviewed? Yes Do you have support at home? Yes   Follow up appointments reviewed:  PCP Hospital f/u appt confirmed? No  Scheduled to see  on  @ . Specialist Hospital f/u appt confirmed? No  Scheduled to see  on  @ . Are transportation arrangements needed? No  If their condition worsens, is the pt aware to call PCP or go to the Emergency Dept.? Yes Was the patient provided with contact information for the PCP's office or ED? Yes Was to pt encouraged to call back with questions or concerns? Yes   Miquela Costabile [facility]  [facility] Guide Direct Dial: [phone]  Website: [email or web address]

## 2023-02-14 ENCOUNTER — Other Ambulatory Visit: Payer: Self-pay | Admitting: Family Medicine

## 2023-02-15 ENCOUNTER — Ambulatory Visit: Payer: Medicare PPO | Admitting: Podiatry

## 2023-02-15 ENCOUNTER — Encounter: Payer: Self-pay | Admitting: Podiatry

## 2023-02-15 DIAGNOSIS — B351 Tinea unguium: Secondary | ICD-10-CM

## 2023-02-15 DIAGNOSIS — M79674 Pain in right toe(s): Secondary | ICD-10-CM | POA: Diagnosis not present

## 2023-02-15 DIAGNOSIS — I739 Peripheral vascular disease, unspecified: Secondary | ICD-10-CM | POA: Diagnosis not present

## 2023-02-15 DIAGNOSIS — M79675 Pain in left toe(s): Secondary | ICD-10-CM

## 2023-02-19 NOTE — Progress Notes (Signed)
Subjective:  Patient ID: Gabriel Love, male    DOB: Mar 16, 1941,  MRN: [id number]  82 y.o. male presents with at risk foot care. Patient has h/o PAD and painful thick toenails that are difficult to trim. Pain interferes with ambulation. Aggravating factors include wearing enclosed shoe gear. Pain is relieved with periodic professional debridement. He is accompanied by his wife on today's visit. He had episode of cellulitis right LE and it resolved with course of oral antibiotics. Patient states right great toe is a little tender. He denies any redness, drainage or swelling. Chief Complaint  Patient presents with   Routine Post Op    PATIENT STATES THAT HIS FEET HAVE BEEN GOOD SINCE THE LAST VISIT , PATIENT STATES HE SAW HIS pcp WITHIN THE LAST 3 TO 4 MONTHS      PCP: Nelwyn Salisbury, MD.  Review of Systems: Negative except as noted in the HPI.   Allergies  Allergen Reactions   Ivp Dye [Iodinated Contrast Media] Anaphylaxis    Iv dye allergy in 1985, anaphylaxix//a.calhoun- "shocked back to life"   Penicillins Other (See Comments)    Urticaria Has patient had a PCN reaction causing immediate rash, facial/tongue/throat swelling, SOB or lightheadedness with hypotension: no Has patient had a PCN reaction causing severe rash involving mucus membranes or skin necrosis: no Has patient had a PCN reaction that required hospitalization no Has patient had a PCN reaction occurring within the last 10 years: yes If all of the above answers are "NO", then may proceed with Cephalosporin use.    Lisinopril Cough    Objective:  There were no vitals filed for this visit. Constitutional Patient is a pleasant 82 y.o. male morbidly obese in NAD. AAO x 3.  Vascular CFT <3 seconds b/l LE. Faintly palpable DP pulses b/l LE. Diminished PT pulse(s) b/l LE.   Pedal hair absent. No pain with calf compression b/l.   Lower extremity skin temperature gradient within normal limits.   Nonpitting edema noted BLE.  Evidence of chronic venous insufficiency b/l LE. No cyanosis or clubbing noted b/l LE.   Neurologic Protective sensation intact 5/5 intact bilaterally with 10g monofilament b/l. Vibratory sensation intact b/l. No clonus b/l.   Dermatologic Pedal skin is thin, shiny and atrophic b/l.  No open wounds b/l lower extremities. No interdigital macerations b/l lower extremities. Toenails 1-5 b/l elongated, discolored, dystrophic, thickened, crumbly with subungual debris and tenderness to dorsal palpation.   Incurvated nailplate medial border right hallux.  Nail border hypertrophy minimal. There is tenderness to palpation. Sign(s) of infection: no clinical signs of infection noted on examination today..  Orthopedic: Normal muscle strength 5/5 to RLE. Dropfoot LLE. Pes planus deformity noted bilateral LE.Marland Kitchen No pain, crepitus or joint limitation noted with ROM b/l LE.  Wearing shoe with brace LLE.   Last HgA1c:      No data to display         Assessment:   1. Pain due to onychomycosis of toenails of both feet   2. PVD (peripheral vascular disease) (HCC)    Plan:  -Patient's family member present. All questions/concerns addressed on today's visit. -Continue foot and shoe inspections daily. Monitor blood glucose per PCP/Endocrinologist's recommendations. -Continue supportive shoe gear daily. -Toenails were debrided in length and girth 2-5 bilaterally and left great toe with sterile nail nippers and dremel without iatrogenic bleeding.  -No invasive procedure(s) performed. Offending nail border debrided and curretaged right great toe utilizing sterile nail nipper and currette. Border(s) cleansed with  alcohol and triple antibiotic ointment applied. Patient/POA/Caregiver/Facility instructed to apply Neosporin Cream  to right great toe once daily for 7 days. Call office if there are any concerns. -Patient/POA to call should there be question/concern in the interim.  Return in about 3 months (around  05/18/2023).  [name], DPM      [place] LOCATION: [address], [place] [address]                   Office [phone]   [facility] LOCATION: [address] [place], [place] [address] Office [phone]

## 2023-02-23 DIAGNOSIS — C44629 Squamous cell carcinoma of skin of left upper limb, including shoulder: Secondary | ICD-10-CM | POA: Diagnosis not present

## 2023-03-19 ENCOUNTER — Encounter (HOSPITAL_COMMUNITY): Payer: Self-pay

## 2023-03-19 ENCOUNTER — Inpatient Hospital Stay (HOSPITAL_COMMUNITY)
Admission: EM | Admit: 2023-03-19 | Discharge: 2023-03-21 | DRG: 202 | Disposition: A | Discharge status: Home Health | Payer: Medicare PPO | Attending: Family Medicine | Admitting: Family Medicine

## 2023-03-19 ENCOUNTER — Other Ambulatory Visit: Payer: Self-pay

## 2023-03-19 ENCOUNTER — Emergency Department (HOSPITAL_COMMUNITY): Payer: Medicare PPO

## 2023-03-19 DIAGNOSIS — J9601 Acute respiratory failure with hypoxia: Secondary | ICD-10-CM | POA: Diagnosis present

## 2023-03-19 DIAGNOSIS — J441 Chronic obstructive pulmonary disease with (acute) exacerbation: Secondary | ICD-10-CM | POA: Diagnosis present

## 2023-03-19 DIAGNOSIS — J209 Acute bronchitis, unspecified: Principal | ICD-10-CM | POA: Diagnosis present

## 2023-03-19 DIAGNOSIS — Z85828 Personal history of other malignant neoplasm of skin: Secondary | ICD-10-CM

## 2023-03-19 DIAGNOSIS — E66813 Obesity, class 3: Secondary | ICD-10-CM | POA: Diagnosis present

## 2023-03-19 DIAGNOSIS — R0902 Hypoxemia: Secondary | ICD-10-CM | POA: Diagnosis not present

## 2023-03-19 DIAGNOSIS — J4521 Mild intermittent asthma with (acute) exacerbation: Secondary | ICD-10-CM | POA: Diagnosis present

## 2023-03-19 DIAGNOSIS — Z91041 Radiographic dye allergy status: Secondary | ICD-10-CM

## 2023-03-19 DIAGNOSIS — Z8249 Family history of ischemic heart disease and other diseases of the circulatory system: Secondary | ICD-10-CM | POA: Diagnosis not present

## 2023-03-19 DIAGNOSIS — I10 Essential (primary) hypertension: Secondary | ICD-10-CM | POA: Diagnosis not present

## 2023-03-19 DIAGNOSIS — Z7982 Long term (current) use of aspirin: Secondary | ICD-10-CM | POA: Diagnosis not present

## 2023-03-19 DIAGNOSIS — J45901 Unspecified asthma with (acute) exacerbation: Secondary | ICD-10-CM | POA: Diagnosis present

## 2023-03-19 DIAGNOSIS — I5032 Chronic diastolic (congestive) heart failure: Secondary | ICD-10-CM | POA: Diagnosis present

## 2023-03-19 DIAGNOSIS — R7989 Other specified abnormal findings of blood chemistry: Secondary | ICD-10-CM | POA: Diagnosis present

## 2023-03-19 DIAGNOSIS — Z79899 Other long term (current) drug therapy: Secondary | ICD-10-CM

## 2023-03-19 DIAGNOSIS — Z83438 Family history of other disorder of lipoprotein metabolism and other lipidemia: Secondary | ICD-10-CM | POA: Diagnosis not present

## 2023-03-19 DIAGNOSIS — J9602 Acute respiratory failure with hypercapnia: Secondary | ICD-10-CM | POA: Diagnosis present

## 2023-03-19 DIAGNOSIS — F32A Depression, unspecified: Secondary | ICD-10-CM | POA: Diagnosis present

## 2023-03-19 DIAGNOSIS — I110 Hypertensive heart disease with heart failure: Secondary | ICD-10-CM | POA: Diagnosis present

## 2023-03-19 DIAGNOSIS — Z9842 Cataract extraction status, left eye: Secondary | ICD-10-CM

## 2023-03-19 DIAGNOSIS — E782 Mixed hyperlipidemia: Secondary | ICD-10-CM | POA: Diagnosis present

## 2023-03-19 DIAGNOSIS — Z880 Allergy status to penicillin: Secondary | ICD-10-CM

## 2023-03-19 DIAGNOSIS — Z6841 Body Mass Index (BMI) 40.0 and over, adult: Secondary | ICD-10-CM

## 2023-03-19 DIAGNOSIS — N400 Enlarged prostate without lower urinary tract symptoms: Secondary | ICD-10-CM | POA: Diagnosis present

## 2023-03-19 DIAGNOSIS — Z96642 Presence of left artificial hip joint: Secondary | ICD-10-CM | POA: Diagnosis present

## 2023-03-19 DIAGNOSIS — Z9841 Cataract extraction status, right eye: Secondary | ICD-10-CM

## 2023-03-19 DIAGNOSIS — Z720 Tobacco use: Secondary | ICD-10-CM

## 2023-03-19 DIAGNOSIS — R0602 Shortness of breath: Secondary | ICD-10-CM | POA: Diagnosis not present

## 2023-03-19 DIAGNOSIS — J440 Chronic obstructive pulmonary disease with acute lower respiratory infection: Secondary | ICD-10-CM | POA: Diagnosis present

## 2023-03-19 DIAGNOSIS — Z833 Family history of diabetes mellitus: Secondary | ICD-10-CM

## 2023-03-19 DIAGNOSIS — Z83719 Family history of colon polyps, unspecified: Secondary | ICD-10-CM

## 2023-03-19 DIAGNOSIS — J208 Acute bronchitis due to other specified organisms: Secondary | ICD-10-CM | POA: Diagnosis not present

## 2023-03-19 DIAGNOSIS — Z1152 Encounter for screening for COVID-19: Secondary | ICD-10-CM

## 2023-03-19 DIAGNOSIS — Z974 Presence of external hearing-aid: Secondary | ICD-10-CM

## 2023-03-19 DIAGNOSIS — R000 Tachycardia, unspecified: Secondary | ICD-10-CM | POA: Diagnosis not present

## 2023-03-19 DIAGNOSIS — R069 Unspecified abnormalities of breathing: Secondary | ICD-10-CM | POA: Diagnosis not present

## 2023-03-19 DIAGNOSIS — J9811 Atelectasis: Secondary | ICD-10-CM | POA: Diagnosis present

## 2023-03-19 DIAGNOSIS — R918 Other nonspecific abnormal finding of lung field: Secondary | ICD-10-CM | POA: Diagnosis not present

## 2023-03-19 DIAGNOSIS — Z888 Allergy status to other drugs, medicaments and biological substances status: Secondary | ICD-10-CM

## 2023-03-19 DIAGNOSIS — I5031 Acute diastolic (congestive) heart failure: Secondary | ICD-10-CM | POA: Diagnosis not present

## 2023-03-19 DIAGNOSIS — Z7951 Long term (current) use of inhaled steroids: Secondary | ICD-10-CM

## 2023-03-19 DIAGNOSIS — Z961 Presence of intraocular lens: Secondary | ICD-10-CM | POA: Diagnosis present

## 2023-03-19 LAB — CBC
HCT: 49 % (ref 39.0–52.0)
Hemoglobin: 15.8 g/dL (ref 13.0–17.0)
MCH: 30.3 pg (ref 26.0–34.0)
MCHC: 32.2 g/dL (ref 30.0–36.0)
MCV: 94 fL (ref 80.0–100.0)
Platelets: 185 10*3/uL (ref 150–400)
RBC: 5.21 MIL/uL (ref 4.22–5.81)
RDW: 13.5 % (ref 11.5–15.5)
WBC: 10.2 10*3/uL (ref 4.0–10.5)
nRBC: 0 % (ref 0.0–0.2)

## 2023-03-19 LAB — BLOOD GAS, VENOUS
Acid-Base Excess: 4.4 mmol/L — ABNORMAL HIGH (ref 0.0–2.0)
Bicarbonate: 32.7 mmol/L — ABNORMAL HIGH (ref 20.0–28.0)
Drawn by: 442
O2 Saturation: 53.5 %
Patient temperature: 36.7
pCO2, Ven: 61 mm[Hg] — ABNORMAL HIGH (ref 44–60)
pH, Ven: 7.33 (ref 7.25–7.43)
pO2, Ven: <31 mm[Hg] — CL (ref 32–45)

## 2023-03-19 LAB — COMPREHENSIVE METABOLIC PANEL
ALT: 20 U/L (ref 0–44)
AST: 27 U/L (ref 15–41)
Albumin: 3.5 g/dL (ref 3.5–5.0)
Alkaline Phosphatase: 90 U/L (ref 38–126)
Anion gap: 6 (ref 5–15)
BUN: 21 mg/dL (ref 8–23)
CO2: 29 mmol/L (ref 22–32)
Calcium: 8.7 mg/dL — ABNORMAL LOW (ref 8.9–10.3)
Chloride: 104 mmol/L (ref 98–111)
Creatinine, Ser: 0.82 mg/dL (ref 0.61–1.24)
GFR, Estimated: >60 mL/min (ref >60)
Glucose, Bld: 101 mg/dL — ABNORMAL HIGH (ref 70–99)
Potassium: 4.2 mmol/L (ref 3.5–5.1)
Sodium: 139 mmol/L (ref 135–145)
Total Bilirubin: 0.7 mg/dL (ref <1.2)
Total Protein: 6.4 g/dL — ABNORMAL LOW (ref 6.5–8.1)

## 2023-03-19 LAB — RESP PANEL BY RT-PCR (RSV, FLU A&B, COVID)  RVPGX2
Influenza A by PCR: NEGATIVE
Influenza B by PCR: NEGATIVE
Resp Syncytial Virus by PCR: NEGATIVE
SARS Coronavirus 2 by RT PCR: NEGATIVE

## 2023-03-19 LAB — BRAIN NATRIURETIC PEPTIDE: B Natriuretic Peptide: 160 pg/mL — ABNORMAL HIGH (ref 0.0–100.0)

## 2023-03-19 LAB — TROPONIN I (HIGH SENSITIVITY): Troponin I (High Sensitivity): 6 ng/L (ref <18)

## 2023-03-19 MED ORDER — MAGNESIUM SULFATE 2 GM/50ML IV SOLN
2.0000 g | Freq: Once | INTRAVENOUS | Status: AC
  Administered 2023-03-19: 2 g via INTRAVENOUS
  Filled 2023-03-19: qty 50

## 2023-03-19 MED ORDER — ALBUTEROL SULFATE (2.5 MG/3ML) 0.083% IN NEBU
2.5000 mg | INHALATION_SOLUTION | Freq: Once | RESPIRATORY_TRACT | Status: AC
  Administered 2023-03-19: 2.5 mg via RESPIRATORY_TRACT

## 2023-03-19 MED ORDER — ALBUTEROL SULFATE (2.5 MG/3ML) 0.083% IN NEBU
10.0000 mg | INHALATION_SOLUTION | Freq: Once | RESPIRATORY_TRACT | Status: AC
  Administered 2023-03-19: 10 mg via RESPIRATORY_TRACT
  Filled 2023-03-19: qty 12

## 2023-03-19 MED ORDER — IPRATROPIUM BROMIDE 0.02 % IN SOLN
0.5000 mg | Freq: Once | RESPIRATORY_TRACT | Status: AC
  Administered 2023-03-19: 0.5 mg via RESPIRATORY_TRACT
  Filled 2023-03-19: qty 2.5

## 2023-03-19 NOTE — ED Triage Notes (Signed)
ED MD at bedside, RT at bedside.  Pt arrives from home via [facility] with reports of respiratory distress. Pt was found to be 82-88% room air. Fire got initial CBG of 105. EMS reports decreased lung sounds in all fields. Placed pt on CPAP which brought him up to 96-99% and gave 125 solumedrol through 18 g L AC. HR 110, BP 176/82.

## 2023-03-19 NOTE — ED Notes (Signed)
Pt also endorses diarrhea today.

## 2023-03-19 NOTE — H&P (Signed)
History and Physical    Patient: Gabriel Love:[id number] DOB: 01-28-1941 DOA: 03/19/2023 DOS: the patient was seen and examined on 03/19/2023 PCP: Nelwyn Salisbury, MD  Patient coming from: {Point_of_Origin:[address]}  Chief Complaint:  Chief Complaint  Patient presents with   Respiratory Distress   HPI: Gabriel Love is a 82 y.o. male with medical history significant of ***  Review of Systems: {ROS_Text:26778} Past Medical History:  Diagnosis Date   Abdominal hernia    Allergy    Blood transfusion without reported diagnosis 12/2006   At [facility] with hip replacement - 2 units transfused   Cancer (HCC)    skin cancers   Cataract    Depression    DJD (degenerative joint disease)    shoulders bilateral   Environmental allergies    Full dentures    Grade I diastolic dysfunction 05/08/2020   Hearing loss    bilateral hearing aids   History of colonic polyps    History of urinary tract infection    Hyperlipidemia    under control   Hypertension    "borderline"   Nonspecific abnormal finding in stool contents    Other general symptoms(780.99)    Prostate hypertrophy    sees Dr. Patsi Sears    Urinary leakage    Past Surgical History:  Procedure Laterality Date   CATARACT EXTRACTION W/PHACO Right 03/25/2014   Procedure: CATARACT EXTRACTION PHACO AND INTRAOCULAR LENS PLACEMENT; CDE:9.76;  Surgeon: Susa Simmonds, MD;  Location: [facility];  Service: Ophthalmology;  Laterality: Right;   CATARACT EXTRACTION W/PHACO Left 06/24/2014   Procedure: CATARACT EXTRACTION PHACO AND INTRAOCULAR LENS PLACEMENT (IOC);  Surgeon: Susa Simmonds, MD;  Location: [facility];  Service: Ophthalmology;  Laterality: Left;  CDE:8.45   CIRCUMCISION N/A 03/26/2016   Procedure: CIRCUMCISION ADULT;  Surgeon: Jethro Bolus, MD;  Location: [facility];  Service: Urology;  Laterality: N/AAlycia Patten, NON-NEWBORN  1985   COLONOSCOPY  06-19-13, 03/05/15   per Dr. Arlyce Dice, mass at the ileocecal valve plus several  polyps, pathology benign, repeat in one year, polyp 2016    COLONOSCOPY WITH PROPOFOL N/A 07/29/2015   Procedure: COLONOSCOPY WITH PROPOFOL;  Surgeon: Ruffin Frederick, MD;  [facility];  Service: Gastroenterology;  Laterality: N/A;   JOINT REPLACEMENT  12/2006   total left hip per Dr. Eulah Pont    LAPAROSCOPIC PARTIAL COLECTOMY N/A 08/07/2013   Procedure: LAPAROSCOPIC right PARTIAL COLECTOMY;  Surgeon: Romie Levee, MD;  Location: [facility];  Service: General;  Laterality: N/A;   MULTIPLE TOOTH EXTRACTIONS     PROSTATE SURGERY  03-29-11   had CTT per Dr. Patsi Sears, in office   Social History:  reports that he quit smoking about 55 years ago. His smoking use included cigarettes. He started smoking about 60 years ago. He has a 5 pack-year smoking history. His smokeless tobacco use includes chew. He reports that he does not drink alcohol and does not use drugs.  Allergies  Allergen Reactions   Ivp Dye [Iodinated Contrast Media] Anaphylaxis    Iv dye allergy in 1985, anaphylaxix//a.calhoun- "shocked back to life"   Penicillins Other (See Comments)    Urticaria Has patient had a PCN reaction causing immediate rash, facial/tongue/throat swelling, SOB or lightheadedness with hypotension: no Has patient had a PCN reaction causing severe rash involving mucus membranes or skin necrosis: no Has patient had a PCN reaction that required hospitalization no Has patient had a PCN reaction occurring within the last 10 years: yes If all  of the above answers are "NO", then may proceed with Cephalosporin use.    Lisinopril Cough    Family History  Problem Relation Age of Onset   Hypertension Mother    Diabetes Mother    Heart failure Mother        cad/mi with rupture ventricle   Hyperlipidemia Father    Colon polyps Father    Cancer Neg Hx        colon or prostate   Colon cancer Neg Hx    Esophageal cancer Neg Hx    Rectal cancer Neg Hx    Stomach cancer Neg Hx     Prior to Admission  medications   Medication Sig Start Date End Date Taking? Authorizing Provider  albuterol (VENTOLIN HFA) 108 (90 Base) MCG/ACT inhaler Inhale 1-2 puffs into the lungs every 6 (six) hours as needed for wheezing or shortness of breath. 01/28/23   Gareth Eagle, PA-C  aspirin EC 81 MG tablet Take 81 mg by mouth daily.    [provider]  atorvastatin (LIPITOR) 40 MG tablet TAKE 1 TABLET BY MOUTH DAILY 12/28/22   Nelwyn Salisbury, MD  azithromycin (ZITHROMAX) 250 MG tablet Take 1 tablet (250 mg total) by mouth daily. Take first 2 tablets together, then 1 every day until finished. 01/28/23   Gareth Eagle, PA-C  budesonide-formoterol [facility]) 160-4.5 MCG/ACT inhaler Inhale 2 puffs into the lungs in the morning and at bedtime. 09/27/22   Nelwyn Salisbury, MD  Cholecalciferol (VITAMIN D3 PO) Take 1 tablet by mouth daily at 12 noon.    [provider]  escitalopram (LEXAPRO) 20 MG tablet TAKE 1 TABLET BY MOUTH EVERY DAY 01/13/23   Nelwyn Salisbury, MD  furosemide (LASIX) 40 MG tablet TAKE 1 TABLET(40 MG) BY MOUTH TWICE DAILY 03/17/22   Nelwyn Salisbury, MD  metoprolol succinate (TOPROL-XL) 50 MG 24 hr tablet TAKE 1 TABLET BY MOUTH EVERY DAY 02/10/23   Nelwyn Salisbury, MD  Multiple Vitamins-Minerals (CENTRUM ADULTS PO) Take 1 tablet by mouth daily.    [provider]  mupirocin cream (BACTROBAN) 2 % Apply 1 application topically 2 (two) times daily. Patient taking differently: Apply 1 application  topically daily as needed (for irritation). 03/31/21   Nelwyn Salisbury, MD  potassium chloride SA (KLOR-CON M) 20 MEQ tablet TAKE 1 TABLET(20 MEQ) BY MOUTH DAILY 02/16/23   Nelwyn Salisbury, MD  promethazine-dextromethorphan (PROMETHAZINE-DM) 6.25-15 MG/5ML syrup Take 5 mLs by mouth 4 (four) times daily as needed. 04/17/22   Particia Nearing, PA-C  VITAMIN E PO Take 1 capsule by mouth daily at 12 noon.    [provider]    Physical Exam: Vitals:   03/19/23 2142 03/19/23 2144  03/19/23 2146 03/19/23 2315  BP: (!) 159/95   (!) 160/80  Pulse: 69   74  Resp: 16   (!) 23  Temp:   98.1 F (36.7 C)   TempSrc:   Axillary   SpO2: 99%   95%  Weight:  (!) 149.7 kg    Height:  6' (1.829 m)     *** Data Reviewed: {Tip this will not be part of the note when signed- Document your independent interpretation of telemetry tracing, EKG, lab, Radiology test or any other diagnostic tests. Add any new diagnostic test ordered today. (Optional):26781} {Results:26384}  Assessment and Plan: No notes have been filed under this hospital service. Service: Hospitalist     Advance Care Planning:   Code Status:  Full Code ***  Consults: ***  Family Communication: ***  Severity of Illness: {Observation/Inpatient:21159}  Author: Frankey Shown, DO 03/19/2023 11:37 PM  For on call review [email or web address].

## 2023-03-19 NOTE — ED Notes (Signed)
ED TO INPATIENT HANDOFF REPORT  ED Nurse Name and Phone #: Johnney Killian Name/Age/Gender Gabriel Love 82 y.o. male Room/Bed: APA02/APA02  Code Status   Code Status: Full Code  Home/SNF/Other Home Patient oriented to: self, place, time, and situation Is this baseline? 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  Pt arrives from home via [facility] with reports of respiratory distress. Pt was found to be 82-88% room air. Fire got initial CBG of 105. EMS reports decreased lung sounds in all fields. Placed pt on CPAP which brought him up to 96-99% and gave 125 solumedrol through 18 g L AC. HR 110, BP 176/82.    Allergies Allergies  Allergen Reactions   Ivp Dye [Iodinated Contrast Media] Anaphylaxis    Iv dye allergy in 1985, anaphylaxix//a.calhoun- "shocked back to life"   Penicillins Other (See Comments)    Urticaria Has patient had a PCN reaction causing immediate rash, facial/tongue/throat swelling, SOB or lightheadedness with hypotension: no Has patient had a PCN reaction causing severe rash involving mucus membranes or skin necrosis: no Has patient had a PCN reaction that required hospitalization no Has patient had a PCN reaction occurring within the last 10 years: yes If all of the above answers are "NO", then may proceed with Cephalosporin use.    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) (HCC) [191478]  Admitting Physician: Frankey Shown [[id number]]  Attending Physician: Frankey Shown [[id number]]           B Medical/Surgery History Past Medical History:  Diagnosis Date   Abdominal hernia    Allergy    Blood transfusion without reported diagnosis 12/2006   At [facility] with hip replacement - 2 units transfused   Cancer (HCC)    skin cancers   Cataract    Depression    DJD (degenerative joint disease)    shoulders bilateral   Environmental allergies    Full dentures    Grade I diastolic dysfunction 05/08/2020   Hearing loss    bilateral hearing aids   History of colonic polyps    History of urinary tract infection    Hyperlipidemia    under control   Hypertension    "borderline"   Nonspecific abnormal finding in stool contents    Other general symptoms(780.99)    Prostate hypertrophy    sees Dr. Patsi Sears    Urinary leakage    Past Surgical History:  Procedure Laterality Date   CATARACT EXTRACTION W/PHACO Right 03/25/2014   Procedure: CATARACT EXTRACTION PHACO AND INTRAOCULAR LENS PLACEMENT; CDE:9.76;  Surgeon: Susa Simmonds, MD;  Location: [facility];  Service: Ophthalmology;  Laterality: Right;   CATARACT EXTRACTION W/PHACO Left 06/24/2014   Procedure: CATARACT EXTRACTION PHACO AND INTRAOCULAR LENS PLACEMENT (IOC);  Surgeon: Susa Simmonds, MD;  Location: [facility];  Service: Ophthalmology;  Laterality: Left;  CDE:8.45   CIRCUMCISION N/A 03/26/2016   Procedure: CIRCUMCISION ADULT;  Surgeon: Jethro Bolus, MD;  Location: [facility];  Service: Urology;  Laterality: N/A;   CIRCUMCISION, NON-NEWBORN  1985  COLONOSCOPY  06-19-13, 03/05/15   per Dr. Arlyce Dice, mass at the ileocecal valve plus several polyps, pathology benign, repeat in one year, polyp 2016    COLONOSCOPY WITH PROPOFOL N/A 07/29/2015   Procedure: COLONOSCOPY WITH PROPOFOL;  Surgeon: Ruffin Frederick, MD;  Location: [facility];  Service: Gastroenterology;  Laterality: N/A;   JOINT REPLACEMENT  12/2006   total left hip per Dr. Eulah Pont    LAPAROSCOPIC PARTIAL COLECTOMY N/A 08/07/2013   Procedure: LAPAROSCOPIC right  PARTIAL COLECTOMY;  Surgeon: Romie Levee, MD;  Location: [facility];  Service: General;  Laterality: N/A;   MULTIPLE TOOTH EXTRACTIONS     PROSTATE SURGERY  03-29-11   had CTT per Dr. Patsi Sears, in office     A IV Location/Drains/Wounds Patient Lines/Drains/Airways Status     Active Line/Drains/Airways     Name Placement date Placement time Site Days   Peripheral IV 03/19/23 20 G Anterior;Left;Proximal Forearm 03/19/23  2124  Forearm  less than 1   Incision - 3 Ports Abdomen 1: Right 2: Left 3: Umbilicus;Lower 08/07/13  --  -- 3511            Intake/Output Last 24 hours No intake or output data in the 24 hours ending 03/19/23 2342  Labs/Imaging Results for orders placed or performed during the hospital encounter of 03/19/23 (from the past 48 hours)  Comprehensive metabolic panel     Status: Abnormal   Collection Time: 03/19/23  9:45 PM  Result Value Ref Range   Sodium 139 135 - 145 mmol/L   Potassium 4.2 3.5 - 5.1 mmol/L   Chloride 104 98 - 111 mmol/L   CO2 29 22 - 32 mmol/L   Glucose, Bld 101 (H) 70 - 99 mg/dL    Comment: Glucose reference range applies only to samples taken after fasting for at least 8 hours.   BUN 21 8 - 23 mg/dL   Creatinine, Ser 9.60 0.61 - 1.24 mg/dL   Calcium 8.7 (L) 8.9 - 10.3 mg/dL   Total Protein 6.4 (L) 6.5 - 8.1 g/dL   Albumin 3.5 3.5 - 5.0 g/dL   AST 27 15 - 41 U/L   ALT 20 0 - 44 U/L   Alkaline Phosphatase 90 38 - 126 U/L   Total Bilirubin 0.7 <1.2 mg/dL   GFR, Estimated >45 >40 mL/min    Comment: (NOTE) Calculated using the CKD-EPI Creatinine Equation (2021)    Anion gap 6 5 - 15    Comment: Performed at [facility], [address] [place], [place] [address]  CBC     Status: None   Collection Time: 03/19/23  9:45 PM  Result Value Ref Range   WBC 10.2 4.0 - 10.5 K/uL   RBC 5.21 4.22 - 5.81 MIL/uL   Hemoglobin 15.8 13.0 - 17.0 g/dL   HCT 14.7 82.9 - 56.2 %   MCV 94.0 80.0 - 100.0 fL   MCH 30.3 26.0 - 34.0 pg   MCHC 32.2 30.0 -  36.0 g/dL   RDW 13.0 86.5 - 78.4 %   Platelets 185 150 - 400 K/uL   nRBC 0.0 0.0 - 0.2 %    Comment: Performed at [facility], [address] [place], [place] [address]  Blood gas, venous     Status: Abnormal   Collection Time: 03/19/23  9:45 PM  Result Value Ref Range   pH, Ven 7.33 7.25 - 7.43   pCO2, Ven 61 (H) 44 - 60 mmHg   pO2, Ven <31 (LL) 32 -  45 mmHg    Comment: CRITICAL RESULT CALLED TO, READ BACK BY AND VERIFIED WITH: Odus Clasby,H @ 2216 ON 03/19/23 BY JUW    Bicarbonate 32.7 (H) 20.0 - 28.0 mmol/L   Acid-Base Excess 4.4 (H) 0.0 - 2.0 mmol/L   O2 Saturation 53.5 %   Patient temperature 36.7    Collection site BLOOD RIGHT ARM    Drawn by 442     Comment: Performed at [facility], [address], [place], [place] [address]  Brain natriuretic peptide     Status: Abnormal   Collection Time: 03/19/23  9:45 PM  Result Value Ref Range   B Natriuretic Peptide 160.0 (H) 0.0 - 100.0 pg/mL    Comment: Performed at [facility], [address] [place], [place] [address]  Troponin I (High Sensitivity)     Status: None   Collection Time: 03/19/23  9:45 PM  Result Value Ref Range   Troponin I (High Sensitivity) 6 <18 ng/L    Comment: (NOTE) Elevated high sensitivity troponin I (hsTnI) values and significant  changes across serial measurements may suggest ACS but many other  chronic and acute conditions are known to elevate hsTnI results.  Refer to the "Links" section for chest pain algorithms and additional  guidance. Performed at [facility], [address] [place], [place] [address]   Resp panel by RT-PCR (RSV, Flu A&B, Covid) Anterior Nasal Swab     Status: None   Collection Time: 03/19/23  9:46 PM   Specimen: Anterior Nasal Swab  Result Value Ref Range   SARS Coronavirus 2 by RT PCR NEGATIVE NEGATIVE    Comment: (NOTE) SARS-CoV-2 target nucleic acids are NOT DETECTED.  The SARS-CoV-2 RNA is generally detectable in upper respiratory specimens during the acute phase of  infection. The lowest concentration of SARS-CoV-2 viral copies this assay can detect is 138 copies/mL. A negative result does not preclude SARS-Cov-2 infection and should not be used as the sole basis for treatment or other patient management decisions. A negative result may occur with  improper specimen collection/handling, submission of specimen other than nasopharyngeal swab, presence of viral mutation(s) within the areas targeted by this assay, and inadequate number of viral copies(<138 copies/mL). A negative result must be combined with clinical observations, patient history, and epidemiological information. The expected result is Negative.  Fact Sheet for Patients:  BloggerCourse.com  Fact Sheet for Healthcare Providers:  SeriousBroker.it  This test is no t yet approved or cleared by the [place] FDA and  has been authorized for detection and/or diagnosis of SARS-CoV-2 by FDA under an Emergency Use Authorization (EUA). This EUA will remain  in effect (meaning this test can be used) for the duration of the COVID-19 declaration under Section 564(b)(1) of the Act, 21 U.S.C.section 360bbb-3(b)(1), unless the authorization is terminated  or revoked sooner.       Influenza A by PCR NEGATIVE NEGATIVE   Influenza B by PCR NEGATIVE NEGATIVE    Comment: (NOTE) The Xpert Xpress SARS-CoV-2/FLU/RSV plus assay is intended as an aid in the diagnosis of influenza from Nasopharyngeal swab specimens and should not be used as a sole basis for treatment. Nasal washings and aspirates are unacceptable for Xpert Xpress SARS-CoV-2/FLU/RSV testing.  Fact Sheet for Patients: BloggerCourse.com  Fact Sheet for Healthcare Providers: SeriousBroker.it  This test is not yet approved or cleared by the [place] FDA and has been authorized for detection and/or diagnosis of SARS-CoV-2 by FDA  under an Emergency Use Authorization (EUA). This EUA will  remain in effect (meaning this test can be used) for the duration of the COVID-19 declaration under Section 564(b)(1) of the Act, 21 U.S.C. section 360bbb-3(b)(1), unless the authorization is terminated or revoked.     Resp Syncytial Virus by PCR NEGATIVE NEGATIVE    Comment: (NOTE) Fact Sheet for Patients: BloggerCourse.com  Fact Sheet for Healthcare Providers: SeriousBroker.it  This test is not yet approved or cleared by the [place] FDA and has been authorized for detection and/or diagnosis of SARS-CoV-2 by FDA under an Emergency Use Authorization (EUA). This EUA will remain in effect (meaning this test can be used) for the duration of the COVID-19 declaration under Section 564(b)(1) of the Act, 21 U.S.C. section 360bbb-3(b)(1), unless the authorization is terminated or revoked.  Performed at [facility], [address], [place], [place] [address]    DG Chest Port 1 View Result Date: 03/19/2023 CLINICAL DATA:  Shortness of breath. Respiratory distress and hypoxia. EXAM: PORTABLE CHEST 1 VIEW COMPARISON:  Radiograph 01/28/2023.  CT 01/02/2022 FINDINGS: The lungs are hyperinflated. The heart is borderline enlarged but stable. Stable mediastinal contours. Mild central bronchial thickening. Subsegmental atelectasis at the left lung base. No pneumothorax or large pleural effusion. No pulmonary edema. IMPRESSION: 1. Hyperinflation with central bronchial thickening, can be seen with bronchitis or asthma. 2. Subsegmental atelectasis at the left lung base. Electronically Signed   By: Narda Rutherford M.D.   On: 03/19/2023 22:09    Pending Labs Unresulted Labs (From admission, onward)    None       Vitals/Pain Today's Vitals   03/19/23 2142 03/19/23 2144 03/19/23 2146 03/19/23 2315  BP: (!) 159/95   (!) 160/80  Pulse: 69   74  Resp: 16   (!) 23  Temp:   98.1 F (36.7  C)   TempSrc:   Axillary   SpO2: 99%   95%  Weight:  (!) 149.7 kg    Height:  6' (1.829 m)    PainSc: 0-No pain       Isolation Precautions No active isolations  Medications Medications  albuterol (PROVENTIL) (2.5 MG/3ML) 0.083% nebulizer solution 10 mg (10 mg Nebulization Given 03/19/23 2202)  ipratropium (ATROVENT) nebulizer solution 0.5 mg (0.5 mg Nebulization Given 03/19/23 2202)  magnesium sulfate IVPB 2 g 50 mL (2 g Intravenous New Bag/Given 03/19/23 2207)  albuterol (PROVENTIL) (2.5 MG/3ML) 0.083% nebulizer solution 2.5 mg (2.5 mg Nebulization Given 03/19/23 2340)    Mobility non-ambulatory     Focused Assessments Pulmonary Assessment Handoff:  Lung sounds: Bilateral Breath Sounds: Diminished, Expiratory wheezes O2 Device: Nasal Cannula O2 Flow Rate (L/min): 2 L/min    R Recommendations: See Admitting Provider Note  Report given to:   Additional Notes: began on Bipap, now on [facility], wife at bedside

## 2023-03-19 NOTE — ED Provider Notes (Signed)
[facility] AT [facility] Provider Note   CSN: [id number] Arrival date & time: 03/19/23  2138     History  Chief Complaint  Patient presents with   Respiratory Distress    Gabriel Love is a 82 y.o. male.  82 year old male with history of heart failure with preserved ejection fraction, hypertension, hyperlipidemia, and asthma who presents to the emergency department with shortness of breath.  Over the past few days has been sick.  Proximately an hour prior to arrival started developing severe shortness of breath and so called 911.  Has been trying his inhaler which she reports helped some.  Was given Solu-Medrol by EMS.  Denies any chest pain.  Says that his lower extremity swelling is at baseline.       Home Medications Prior to Admission medications   Medication Sig Start Date End Date Taking? Authorizing Provider  albuterol (VENTOLIN HFA) 108 (90 Base) MCG/ACT inhaler Inhale 1-2 puffs into the lungs every 6 (six) hours as needed for wheezing or shortness of breath. 01/28/23   Gareth Eagle, PA-C  aspirin EC 81 MG tablet Take 81 mg by mouth daily.    [provider]  atorvastatin (LIPITOR) 40 MG tablet TAKE 1 TABLET BY MOUTH DAILY 12/28/22   Nelwyn Salisbury, MD  azithromycin (ZITHROMAX) 250 MG tablet Take 1 tablet (250 mg total) by mouth daily. Take first 2 tablets together, then 1 every day until finished. 01/28/23   Gareth Eagle, PA-C  budesonide-formoterol [facility]) 160-4.5 MCG/ACT inhaler Inhale 2 puffs into the lungs in the morning and at bedtime. 09/27/22   Nelwyn Salisbury, MD  Cholecalciferol (VITAMIN D3 PO) Take 1 tablet by mouth daily at 12 noon.    [provider]  escitalopram (LEXAPRO) 20 MG tablet TAKE 1 TABLET BY MOUTH EVERY DAY 01/13/23   Nelwyn Salisbury, MD  furosemide (LASIX) 40 MG tablet TAKE 1 TABLET(40 MG) BY MOUTH TWICE DAILY 03/17/22   Nelwyn Salisbury, MD  metoprolol succinate (TOPROL-XL) 50 MG 24 hr tablet  TAKE 1 TABLET BY MOUTH EVERY DAY 02/10/23   Nelwyn Salisbury, MD  Multiple Vitamins-Minerals (CENTRUM ADULTS PO) Take 1 tablet by mouth daily.    [provider]  mupirocin cream (BACTROBAN) 2 % Apply 1 application topically 2 (two) times daily. Patient taking differently: Apply 1 application  topically daily as needed (for irritation). 03/31/21   Nelwyn Salisbury, MD  potassium chloride SA (KLOR-CON M) 20 MEQ tablet TAKE 1 TABLET(20 MEQ) BY MOUTH DAILY 02/16/23   Nelwyn Salisbury, MD  promethazine-dextromethorphan (PROMETHAZINE-DM) 6.25-15 MG/5ML syrup Take 5 mLs by mouth 4 (four) times daily as needed. 04/17/22   Particia Nearing, PA-C  VITAMIN E PO Take 1 capsule by mouth daily at 12 noon.    [provider]      Allergies    Ivp dye [iodinated contrast media], Penicillins, and Lisinopril    Review of Systems   Review of Systems  Physical Exam Updated Vital Signs BP (!) 159/95   Pulse 69   Temp 98.1 F (36.7 C) (Axillary)   Resp 16   Ht 6' (1.829 m)   Wt (!) 149.7 kg   SpO2 99% Comment: CPAP  BMI 44.76 kg/m  Physical Exam Constitutional:      General: He is in acute distress.     Appearance: He is ill-appearing.     Comments: On BiPAP  HENT:     Head: Normocephalic.  Right Ear: External ear normal.     Left Ear: External ear normal.     Nose: Nose normal.  Eyes:     Conjunctiva/sclera: Conjunctivae normal.     Pupils: Pupils are equal, round, and reactive to light.  Cardiovascular:     Rate and Rhythm: Regular rhythm. Tachycardia present.     Pulses: Normal pulses.     Heart sounds: Normal heart sounds.  Pulmonary:     Effort: Respiratory distress present.     Breath sounds: Wheezing present.  Musculoskeletal:     Right lower leg: Edema present.     Left lower leg: Edema present.  Neurological:     Mental Status: He is alert and oriented to person, place, and time. Mental status is at baseline.     ED Results / Procedures / Treatments    Labs (all labs ordered are listed, but only abnormal results are displayed) Labs Reviewed  COMPREHENSIVE METABOLIC PANEL - Abnormal; Notable for the following components:      Result Value   Glucose, Bld 101 (*)    Calcium 8.7 (*)    Total Protein 6.4 (*)    All other components within normal limits  BLOOD GAS, VENOUS - Abnormal; Notable for the following components:   pCO2, Ven 61 (*)    pO2, Ven <31 (*)    Bicarbonate 32.7 (*)    Acid-Base Excess 4.4 (*)    All other components within normal limits  BRAIN NATRIURETIC PEPTIDE - Abnormal; Notable for the following components:   B Natriuretic Peptide 160.0 (*)    All other components within normal limits  RESP PANEL BY RT-PCR (RSV, FLU A&B, COVID)  RVPGX2  CBC  TROPONIN I (HIGH SENSITIVITY)  TROPONIN I (HIGH SENSITIVITY)    EKG EKG Interpretation Date/Time:  Saturday March 19 2023 21:42:01 EST Ventricular Rate:  71 PR Interval:  194 QRS Duration:  89 QT Interval:  389 QTC Calculation: 423 R Axis:   35  Text Interpretation: Sinus rhythm Multiple ventricular premature complexes Low voltage, precordial leads Confirmed by Vonita Moss [phone]) on 03/19/2023 10:39:18 PM  Radiology DG Chest Port 1 View Result Date: 03/19/2023 CLINICAL DATA:  Shortness of breath. Respiratory distress and hypoxia. EXAM: PORTABLE CHEST 1 VIEW COMPARISON:  Radiograph 01/28/2023.  CT 01/02/2022 FINDINGS: The lungs are hyperinflated. The heart is borderline enlarged but stable. Stable mediastinal contours. Mild central bronchial thickening. Subsegmental atelectasis at the left lung base. No pneumothorax or large pleural effusion. No pulmonary edema. IMPRESSION: 1. Hyperinflation with central bronchial thickening, can be seen with bronchitis or asthma. 2. Subsegmental atelectasis at the left lung base. Electronically Signed   By: Narda Rutherford M.D.   On: 03/19/2023 22:09    Procedures Procedures    Medications Ordered in ED Medications   albuterol (PROVENTIL) (2.5 MG/3ML) 0.083% nebulizer solution 2.5 mg (has no administration in time range)  albuterol (PROVENTIL) (2.5 MG/3ML) 0.083% nebulizer solution 10 mg (10 mg Nebulization Given 03/19/23 2202)  ipratropium (ATROVENT) nebulizer solution 0.5 mg (0.5 mg Nebulization Given 03/19/23 2202)  magnesium sulfate IVPB 2 g 50 mL (2 g Intravenous New Bag/Given 03/19/23 2207)    ED Course/ Medical Decision Making/ A&P                                 Medical Decision Making Amount and/or Complexity of Data Reviewed Labs: ordered. Radiology: ordered.  Risk Prescription drug management. Decision regarding  hospitalization.   Gabriel Love is a 82 y.o. male with comorbidities that complicate the patient evaluation including heart failure with preserved ejection fraction, hypertension, hyperlipidemia, and asthma who presents to the emergency department with shortness of breath.    Initial Ddx:  COPD exacerbation, heart failure aspiration, anxiety, MI, pneumonia, URI  MDM/Course:  Patient presents to the emergency department with shortness of breath.  Appears that he been sick recently and about an hour prior to arrival became acutely dyspneic.  On exam does have diffuse wheezing.  He was initially on BiPAP but was able to be de-escalated quickly.  Was given Solu-Medrol by EMS.  Was given nebulizers by [place] as well as magnesium.  pCO2 is modestly elevated at 61 but patient was not acidotic.  BNP mildly elevated.  COVID and flu were negative.  Chest x-ray without pneumonia.  Upon re-evaluation patient was stable and was on nasal cannula.  Admitted to hospitalist for further management.  This patient presents to the ED for concern of complaints listed in HPI, this involves an extensive number of treatment options, and is a complaint that carries with it a high risk of complications and morbidity. Disposition including potential need for admission considered.   Dispo: Admit to  Floor  Additional history obtained from spouse Records reviewed Outpatient Clinic Notes The following labs were independently interpreted: Chemistry and show DKA I independently reviewed the following imaging with scope of interpretation limited to determining acute life threatening conditions related to emergency care: Chest x-ray and agree with the radiologist interpretation with the following exceptions: none I personally reviewed and interpreted cardiac monitoring: normal sinus rhythm  I personally reviewed and interpreted the pt's EKG: see above for interpretation  I have reviewed the patients home medications and made adjustments as needed Consults: Hospitalist Social Determinants of health:  Elderly  Portions of this note were generated with Scientist, clinical (histocompatibility and immunogenetics). Dictation errors may occur despite best attempts at proofreading.    CRITICAL CARE Performed by: Rondel Baton   Total critical care time: 30 minutes  Critical care time was exclusive of separately billable procedures and treating other patients.  Critical care was necessary to treat or prevent imminent or life-threatening deterioration.  Critical care was time spent personally by me on the following activities: development of treatment plan with patient and/or surrogate as well as nursing, discussions with consultants, evaluation of patient's response to treatment, examination of patient, obtaining history from patient or surrogate, ordering and performing treatments and interventions, ordering and review of laboratory studies, ordering and review of radiographic studies, pulse oximetry and re-evaluation of patient's condition.   Final Clinical Impression(s) / ED Diagnoses Final diagnoses:  COPD exacerbation (HCC)  Acute respiratory failure with hypercapnia [facility])    Rx / DC Orders ED Discharge Orders     None         Rondel Baton, MD 03/19/23 2324

## 2023-03-20 ENCOUNTER — Observation Stay (HOSPITAL_BASED_OUTPATIENT_CLINIC_OR_DEPARTMENT_OTHER): Payer: Medicare PPO

## 2023-03-20 DIAGNOSIS — R7989 Other specified abnormal findings of blood chemistry: Secondary | ICD-10-CM | POA: Diagnosis present

## 2023-03-20 DIAGNOSIS — J9602 Acute respiratory failure with hypercapnia: Secondary | ICD-10-CM | POA: Diagnosis present

## 2023-03-20 DIAGNOSIS — Z6841 Body Mass Index (BMI) 40.0 and over, adult: Secondary | ICD-10-CM | POA: Diagnosis not present

## 2023-03-20 DIAGNOSIS — E782 Mixed hyperlipidemia: Secondary | ICD-10-CM | POA: Diagnosis present

## 2023-03-20 DIAGNOSIS — F32A Depression, unspecified: Secondary | ICD-10-CM | POA: Diagnosis present

## 2023-03-20 DIAGNOSIS — J208 Acute bronchitis due to other specified organisms: Secondary | ICD-10-CM | POA: Diagnosis not present

## 2023-03-20 DIAGNOSIS — I10 Essential (primary) hypertension: Secondary | ICD-10-CM | POA: Diagnosis not present

## 2023-03-20 DIAGNOSIS — E66813 Obesity, class 3: Secondary | ICD-10-CM | POA: Diagnosis present

## 2023-03-20 DIAGNOSIS — Z85828 Personal history of other malignant neoplasm of skin: Secondary | ICD-10-CM | POA: Diagnosis not present

## 2023-03-20 DIAGNOSIS — Z720 Tobacco use: Secondary | ICD-10-CM | POA: Diagnosis not present

## 2023-03-20 DIAGNOSIS — Z8249 Family history of ischemic heart disease and other diseases of the circulatory system: Secondary | ICD-10-CM | POA: Diagnosis not present

## 2023-03-20 DIAGNOSIS — Z1152 Encounter for screening for COVID-19: Secondary | ICD-10-CM | POA: Diagnosis not present

## 2023-03-20 DIAGNOSIS — Z7982 Long term (current) use of aspirin: Secondary | ICD-10-CM | POA: Diagnosis not present

## 2023-03-20 DIAGNOSIS — I110 Hypertensive heart disease with heart failure: Secondary | ICD-10-CM | POA: Diagnosis present

## 2023-03-20 DIAGNOSIS — J440 Chronic obstructive pulmonary disease with acute lower respiratory infection: Secondary | ICD-10-CM | POA: Diagnosis present

## 2023-03-20 DIAGNOSIS — J9811 Atelectasis: Secondary | ICD-10-CM | POA: Diagnosis present

## 2023-03-20 DIAGNOSIS — Z83438 Family history of other disorder of lipoprotein metabolism and other lipidemia: Secondary | ICD-10-CM | POA: Diagnosis not present

## 2023-03-20 DIAGNOSIS — I5031 Acute diastolic (congestive) heart failure: Secondary | ICD-10-CM

## 2023-03-20 DIAGNOSIS — N400 Enlarged prostate without lower urinary tract symptoms: Secondary | ICD-10-CM | POA: Diagnosis present

## 2023-03-20 DIAGNOSIS — J209 Acute bronchitis, unspecified: Secondary | ICD-10-CM | POA: Diagnosis present

## 2023-03-20 DIAGNOSIS — J9601 Acute respiratory failure with hypoxia: Secondary | ICD-10-CM | POA: Diagnosis present

## 2023-03-20 DIAGNOSIS — Z83719 Family history of colon polyps, unspecified: Secondary | ICD-10-CM | POA: Diagnosis not present

## 2023-03-20 DIAGNOSIS — Z833 Family history of diabetes mellitus: Secondary | ICD-10-CM | POA: Diagnosis not present

## 2023-03-20 DIAGNOSIS — J4521 Mild intermittent asthma with (acute) exacerbation: Secondary | ICD-10-CM | POA: Diagnosis present

## 2023-03-20 DIAGNOSIS — J441 Chronic obstructive pulmonary disease with (acute) exacerbation: Secondary | ICD-10-CM | POA: Diagnosis present

## 2023-03-20 DIAGNOSIS — I5032 Chronic diastolic (congestive) heart failure: Secondary | ICD-10-CM | POA: Diagnosis present

## 2023-03-20 DIAGNOSIS — Z7951 Long term (current) use of inhaled steroids: Secondary | ICD-10-CM | POA: Diagnosis not present

## 2023-03-20 LAB — CBC
HCT: 48.3 % (ref 39.0–52.0)
Hemoglobin: 15.2 g/dL (ref 13.0–17.0)
MCH: 29.5 pg (ref 26.0–34.0)
MCHC: 31.5 g/dL (ref 30.0–36.0)
MCV: 93.6 fL (ref 80.0–100.0)
Platelets: 166 10*3/uL (ref 150–400)
RBC: 5.16 MIL/uL (ref 4.22–5.81)
RDW: 13.2 % (ref 11.5–15.5)
WBC: 8.6 10*3/uL (ref 4.0–10.5)
nRBC: 0 % (ref 0.0–0.2)

## 2023-03-20 LAB — TROPONIN I (HIGH SENSITIVITY): Troponin I (High Sensitivity): 6 ng/L (ref <18)

## 2023-03-20 LAB — COMPREHENSIVE METABOLIC PANEL
ALT: 19 U/L (ref 0–44)
AST: 25 U/L (ref 15–41)
Albumin: 3.3 g/dL — ABNORMAL LOW (ref 3.5–5.0)
Alkaline Phosphatase: 74 U/L (ref 38–126)
Anion gap: 9 (ref 5–15)
BUN: 19 mg/dL (ref 8–23)
CO2: 24 mmol/L (ref 22–32)
Calcium: 8.3 mg/dL — ABNORMAL LOW (ref 8.9–10.3)
Chloride: 106 mmol/L (ref 98–111)
Creatinine, Ser: 0.72 mg/dL (ref 0.61–1.24)
GFR, Estimated: >60 mL/min (ref >60)
Glucose, Bld: 155 mg/dL — ABNORMAL HIGH (ref 70–99)
Potassium: 3.5 mmol/L (ref 3.5–5.1)
Sodium: 139 mmol/L (ref 135–145)
Total Bilirubin: 0.6 mg/dL (ref <1.2)
Total Protein: 6.1 g/dL — ABNORMAL LOW (ref 6.5–8.1)

## 2023-03-20 LAB — ECHOCARDIOGRAM COMPLETE
AR max vel: 1.86 cm2
AV Area VTI: 2.09 cm2
AV Area mean vel: 1.81 cm2
AV Mean grad: 8 mm[Hg]
AV Peak grad: 13.7 mm[Hg]
Ao pk vel: 1.85 m/s
Area-P 1/2: 2.59 cm2
Height: 72 in
S' Lateral: 2.85 cm
Weight: 5530.9 [oz_av]

## 2023-03-20 LAB — MAGNESIUM: Magnesium: 2.4 mg/dL (ref 1.7–2.4)

## 2023-03-20 LAB — PHOSPHORUS: Phosphorus: 2.2 mg/dL — ABNORMAL LOW (ref 2.5–4.6)

## 2023-03-20 MED ORDER — IPRATROPIUM-ALBUTEROL 0.5-2.5 (3) MG/3ML IN SOLN: 3.0000 mL | RESPIRATORY_TRACT | Status: DC | PRN

## 2023-03-20 MED ORDER — ATORVASTATIN CALCIUM 40 MG PO TABS
40.0000 mg | ORAL_TABLET | Freq: Every day | ORAL | Status: DC
Start: 2023-03-20 — End: 2023-03-21
  Administered 2023-03-20 – 2023-03-21 (×2): 40 mg via ORAL
  Filled 2023-03-20 (×2): qty 1

## 2023-03-20 MED ORDER — SODIUM CHLORIDE 0.9 % IV SOLN
2.0000 g | INTRAVENOUS | Status: DC
  Administered 2023-03-20: 2 g via INTRAVENOUS
  Filled 2023-03-20 (×2): qty 20

## 2023-03-20 MED ORDER — PANTOPRAZOLE SODIUM 40 MG PO TBEC
40.0000 mg | DELAYED_RELEASE_TABLET | Freq: Every day | ORAL | Status: DC
  Administered 2023-03-20 – 2023-03-21 (×2): 40 mg via ORAL
  Filled 2023-03-20 (×2): qty 1

## 2023-03-20 MED ORDER — ASPIRIN 81 MG PO TBEC
81.0000 mg | DELAYED_RELEASE_TABLET | Freq: Every day | ORAL | Status: DC
  Administered 2023-03-20 – 2023-03-21 (×2): 81 mg via ORAL
  Filled 2023-03-20 (×2): qty 1

## 2023-03-20 MED ORDER — ONDANSETRON HCL 4 MG PO TABS
4.0000 mg | ORAL_TABLET | Freq: Four times a day (QID) | ORAL | Status: DC | PRN
Start: 2023-03-20 — End: 2023-03-21

## 2023-03-20 MED ORDER — AZITHROMYCIN 250 MG PO TABS
500.0000 mg | ORAL_TABLET | Freq: Every day | ORAL | Status: AC
  Administered 2023-03-20: 500 mg via ORAL
  Filled 2023-03-20: qty 2

## 2023-03-20 MED ORDER — METHYLPREDNISOLONE SODIUM SUCC 40 MG IJ SOLR
40.0000 mg | Freq: Two times a day (BID) | INTRAMUSCULAR | Status: DC
  Administered 2023-03-20 – 2023-03-21 (×3): 40 mg via INTRAVENOUS
  Filled 2023-03-20 (×3): qty 1

## 2023-03-20 MED ORDER — METOPROLOL SUCCINATE ER 50 MG PO TB24
50.0000 mg | ORAL_TABLET | Freq: Every day | ORAL | Status: DC
  Administered 2023-03-20 – 2023-03-21 (×2): 50 mg via ORAL
  Filled 2023-03-20 (×2): qty 1

## 2023-03-20 MED ORDER — AZITHROMYCIN 250 MG PO TABS
250.0000 mg | ORAL_TABLET | Freq: Every day | ORAL | Status: DC
  Administered 2023-03-21: 250 mg via ORAL
  Filled 2023-03-20: qty 1

## 2023-03-20 MED ORDER — DM-GUAIFENESIN ER 30-600 MG PO TB12
1.0000 | ORAL_TABLET | Freq: Two times a day (BID) | ORAL | Status: DC
  Administered 2023-03-20 – 2023-03-21 (×4): 1 via ORAL
  Filled 2023-03-20 (×4): qty 1

## 2023-03-20 MED ORDER — ONDANSETRON HCL 4 MG/2ML IJ SOLN: 4.0000 mg | Freq: Four times a day (QID) | INTRAMUSCULAR | Status: DC | PRN

## 2023-03-20 MED ORDER — FUROSEMIDE 40 MG PO TABS
40.0000 mg | ORAL_TABLET | Freq: Once | ORAL | Status: AC
  Administered 2023-03-20: 40 mg via ORAL
  Filled 2023-03-20: qty 1

## 2023-03-20 MED ORDER — ENOXAPARIN SODIUM 80 MG/0.8ML IJ SOSY
78.0000 mg | PREFILLED_SYRINGE | INTRAMUSCULAR | Status: DC
  Administered 2023-03-20: 78 mg via SUBCUTANEOUS
  Filled 2023-03-20 (×2): qty 0.8

## 2023-03-20 MED ORDER — ALBUTEROL SULFATE (2.5 MG/3ML) 0.083% IN NEBU: 2.5000 mg | INHALATION_SOLUTION | RESPIRATORY_TRACT | Status: DC | PRN

## 2023-03-20 MED ORDER — ACETAMINOPHEN 325 MG PO TABS
650.0000 mg | ORAL_TABLET | Freq: Four times a day (QID) | ORAL | Status: DC | PRN
Start: 2023-03-20 — End: 2023-03-21

## 2023-03-20 MED ORDER — IPRATROPIUM-ALBUTEROL 0.5-2.5 (3) MG/3ML IN SOLN: 3.0000 mL | Freq: Four times a day (QID) | RESPIRATORY_TRACT | Status: DC

## 2023-03-20 MED ORDER — FUROSEMIDE 40 MG PO TABS: 40.0000 mg | ORAL_TABLET | Freq: Every day | ORAL | Status: DC

## 2023-03-20 MED ORDER — K PHOS MONO-SOD PHOS DI & MONO 155-852-130 MG PO TABS
250.0000 mg | ORAL_TABLET | Freq: Three times a day (TID) | ORAL | Status: DC
  Administered 2023-03-20 – 2023-03-21 (×4): 250 mg via ORAL
  Filled 2023-03-20 (×5): qty 1

## 2023-03-20 MED ORDER — ACETAMINOPHEN 650 MG RE SUPP: 650.0000 mg | Freq: Four times a day (QID) | RECTAL | Status: DC | PRN

## 2023-03-20 MED ORDER — FUROSEMIDE 40 MG PO TABS
40.0000 mg | ORAL_TABLET | Freq: Two times a day (BID) | ORAL | Status: DC
  Administered 2023-03-21: 40 mg via ORAL
  Filled 2023-03-20: qty 1

## 2023-03-20 MED ORDER — IPRATROPIUM-ALBUTEROL 0.5-2.5 (3) MG/3ML IN SOLN
3.0000 mL | Freq: Three times a day (TID) | RESPIRATORY_TRACT | Status: DC
  Administered 2023-03-20 – 2023-03-21 (×5): 3 mL via RESPIRATORY_TRACT
  Filled 2023-03-20 (×5): qty 3

## 2023-03-20 MED ORDER — MOMETASONE FURO-FORMOTEROL FUM 200-5 MCG/ACT IN AERO
2.0000 | INHALATION_SPRAY | Freq: Two times a day (BID) | RESPIRATORY_TRACT | Status: DC
  Administered 2023-03-20 – 2023-03-21 (×3): 2 via RESPIRATORY_TRACT
  Filled 2023-03-20: qty 8.8

## 2023-03-20 MED ORDER — GUAIFENESIN-DM 100-10 MG/5ML PO SYRP: 5.0000 mL | ORAL_SOLUTION | ORAL | Status: DC | PRN

## 2023-03-20 MED ORDER — PERFLUTREN LIPID MICROSPHERE
1.0000 mL | INTRAVENOUS | Status: AC | PRN
  Administered 2023-03-20: 3 mL via INTRAVENOUS

## 2023-03-20 NOTE — Progress Notes (Signed)
°  Echocardiogram 2D Echocardiogram has been performed.  Leda Roys RDCS 03/20/2023, 3:15 PM

## 2023-03-20 NOTE — Plan of Care (Signed)

## 2023-03-20 NOTE — Care Management Obs Status (Signed)
MEDICARE OBSERVATION STATUS NOTIFICATION   Patient Details  Name: Gabriel Love MRN: [id number] Date of Birth: 09-21-1940   Medicare Observation Status Notification Given:  Yes    Larrie Kass, LCSW 03/20/2023, 3:20 PM

## 2023-03-20 NOTE — Plan of Care (Signed)

## 2023-03-20 NOTE — Progress Notes (Signed)
Patient was removed form BIPAP to trial to see if his SOB was improved;RT placed on 4lpm [place]. Patient was breathing easy and vitals were stable. Patient continues to have a slight upper respiratory wheeze and a follow up HHN was given.

## 2023-03-20 NOTE — Progress Notes (Signed)
PROGRESS NOTE  Gabriel Love, is a 82 y.o. male, DOB - [birth year], ZOX:[id number]  Admit date - 03/19/2023   Admitting Physician Emmogene Simson Mariea Clonts, MD  Outpatient Primary MD for the patient is Nelwyn Salisbury, MD  LOS - 0  Chief Complaint  Patient presents with   Respiratory Distress      Brief Narrative:  82 y.o. male with medical history significant of hypertension, chronic diastolic CHF, asthma, prior tobacco use, morbid obesity, depression admitted on 03/19/2023 with acute hypoxic respiratory failure in the setting of acute bronchitis    -Assessment and Plan: 1) acute hypoxic respiratory failure--- in the setting of acute bronchitis -Patient initially required BiPAP--respiratory status improving with interventions -- Unable to completely weaned off oxygen at this time -Continue bronchodilators mucolytic's Solu-Medrol Rocephin/azithromycin -- Incentive spirometry advised (chest x-ray with atelectasis and central bronchial thickening) -Flu, RSV and COVID-negative No leukocytosis  2)History of mild intermittent asthma--patient appears to be having asthma flareup due to acute bronchitis -Solu-Medrol and bronchodilators as above #1 -Attempting to wean off oxygen as able  3) chronic diastolic dysfunction CHF--- -stable, no acute exacerbation, -BNP is mild elevated at 160, chest x-ray without CHF type findings -Echo from 03/20/2023 with EF of 60 to 65%, no regional wall motion normalities -No mitral stenosis, no aortic stenosis -Okay to resume PTA Lasix and metoprolol    4)Morbid Obesity- -Low calorie diet, portion control and increase physical activity discussed with patient -Body mass index is 46.88 kg/m.  Status is: Inpatient   Disposition: The patient is from: Home              Anticipated d/c is to: Home              Anticipated d/c date is: 1 day              Patient currently is not medically stable to d/c. Barriers: Not Clinically Stable-   Code Status :  -  Code  Status: Full Code   Family Communication:   (patient is alert, awake and coherent)  Discussed with wife at bedside  DVT Prophylaxis  :   - SCDs   SCDs Start: 03/20/23 0257   Lab Results  Component Value Date   PLT 166 03/20/2023    Inpatient Medications  Scheduled Meds:  aspirin EC  81 mg Oral Daily   atorvastatin  40 mg Oral Daily   [START ON 03/21/2023] azithromycin  250 mg Oral Daily   dextromethorphan-guaiFENesin  1 tablet Oral BID   enoxaparin (LOVENOX) injection  78 mg Subcutaneous Q24H   ipratropium-albuterol  3 mL Nebulization TID   methylPREDNISolone (SOLU-MEDROL) injection  40 mg Intravenous Q12H   metoprolol succinate  50 mg Oral Daily   mometasone-formoterol  2 puff Inhalation BID   pantoprazole  40 mg Oral Daily   phosphorus  250 mg Oral TID   Continuous Infusions:  cefTRIAXone (ROCEPHIN)  IV     PRN Meds:.acetaminophen **OR** acetaminophen, albuterol, guaiFENesin-dextromethorphan, ondansetron **OR** ondansetron (ZOFRAN) IV, perflutren lipid microspheres (DEFINITY) IV suspension   Anti-infectives (From admission, onward)    Start     Dose/Rate Route Frequency Ordered Stop   03/21/23 1000  azithromycin (ZITHROMAX) tablet 250 mg       Placed in "Followed by" Linked Group   250 mg Oral Daily 03/20/23 0251 03/25/23 0959   03/20/23 2200  cefTRIAXone (ROCEPHIN) 2 g in sodium chloride 0.9 % 100 mL IVPB        2 g 200 mL/hr over  30 Minutes Intravenous Every 24 hours 03/20/23 1604 03/25/23 2159   03/20/23 1000  azithromycin (ZITHROMAX) tablet 500 mg       Placed in "Followed by" Linked Group   500 mg Oral Daily 03/20/23 0251 03/20/23 0844         Subjective: Gabriel Love today has no fevers, no emesis,  No chest pain,   -Cough and dyspnea persist -Unable to wean off oxygen at this time -Wife at bedside, questions answered   Objective: Vitals:   03/20/23 0731 03/20/23 0905 03/20/23 1215 03/20/23 1441  BP: (!) 151/83  (!) 108/54   Pulse: 88  67   Resp:  14  18   Temp: 97.8 F (36.6 C)     TempSrc: Oral     SpO2: 97% 93% 93% 94%  Weight:      Height:        Intake/Output Summary (Last 24 hours) at 03/20/2023 1613 Last data filed at 03/20/2023 1300 Gross per 24 hour  Intake 530 ml  Output --  Net 530 ml   Filed Weights   03/19/23 2144 03/20/23 0042  Weight: (!) 149.7 kg (!) 156.8 kg    Physical Exam  Gen:- Awake Alert,  in no apparent distress  HEENT:- [place].AT, No sclera icterus Nose- [place] 2L/min Neck-Supple Neck,No JVD,.  Lungs-improving air movement, few scattered wheezes CV- S1, S2 normal, regular  Abd-  +ve B.Sounds, Abd Soft, No tenderness, increased truncal adiposity Psych-affect is appropriate, oriented x3 Neuro-generalized weakness, no new focal deficits, no tremors  Data Reviewed: I have personally reviewed following labs and imaging studies  CBC: Recent Labs  Lab 03/19/23 2145 03/20/23 0341  WBC 10.2 8.6  HGB 15.8 15.2  HCT 49.0 48.3  MCV 94.0 93.6  PLT 185 166   Basic Metabolic Panel: Recent Labs  Lab 03/19/23 2145 03/20/23 0341  NA 139 139  K 4.2 3.5  CL 104 106  CO2 29 24  GLUCOSE 101* 155*  BUN 21 19  CREATININE 0.82 0.72  CALCIUM 8.7* 8.3*  MG  --  2.4  PHOS  --  2.2*   GFR: Estimated Creatinine Clearance: 108.2 mL/min (by C-G formula based on SCr of 0.72 mg/dL). Liver Function Tests: Recent Labs  Lab 03/19/23 2145 03/20/23 0341  AST 27 25  ALT 20 19  ALKPHOS 90 74  BILITOT 0.7 0.6  PROT 6.4* 6.1*  ALBUMIN 3.5 3.3*   BNP (last 3 results) Recent Labs    09/27/22 1420  PROBNP 52.0   Recent Results (from the past 240 hours)  Resp panel by RT-PCR (RSV, Flu A&B, Covid) Anterior Nasal Swab     Status: None   Collection Time: 03/19/23  9:46 PM   Specimen: Anterior Nasal Swab  Result Value Ref Range Status   SARS Coronavirus 2 by RT PCR NEGATIVE NEGATIVE Final    Comment: (NOTE) SARS-CoV-2 target nucleic acids are NOT DETECTED.  The SARS-CoV-2 RNA is generally detectable in  upper respiratory specimens during the acute phase of infection. The lowest concentration of SARS-CoV-2 viral copies this assay can detect is 138 copies/mL. A negative result does not preclude SARS-Cov-2 infection and should not be used as the sole basis for treatment or other patient management decisions. A negative result may occur with  improper specimen collection/handling, submission of specimen other than nasopharyngeal swab, presence of viral mutation(s) within the areas targeted by this assay, and inadequate number of viral copies(<138 copies/mL). A negative result must be combined with clinical observations,  patient history, and epidemiological information. The expected result is Negative.  Fact Sheet for Patients:  BloggerCourse.com  Fact Sheet for Healthcare Providers:  SeriousBroker.it  This test is no t yet approved or cleared by the [place] FDA and  has been authorized for detection and/or diagnosis of SARS-CoV-2 by FDA under an Emergency Use Authorization (EUA). This EUA will remain  in effect (meaning this test can be used) for the duration of the COVID-19 declaration under Section 564(b)(1) of the Act, 21 U.S.C.section 360bbb-3(b)(1), unless the authorization is terminated  or revoked sooner.       Influenza A by PCR NEGATIVE NEGATIVE Final   Influenza B by PCR NEGATIVE NEGATIVE Final    Comment: (NOTE) The Xpert Xpress SARS-CoV-2/FLU/RSV plus assay is intended as an aid in the diagnosis of influenza from Nasopharyngeal swab specimens and should not be used as a sole basis for treatment. Nasal washings and aspirates are unacceptable for Xpert Xpress SARS-CoV-2/FLU/RSV testing.  Fact Sheet for Patients: BloggerCourse.com  Fact Sheet for Healthcare Providers: SeriousBroker.it  This test is not yet approved or cleared by the [place] FDA and has been  authorized for detection and/or diagnosis of SARS-CoV-2 by FDA under an Emergency Use Authorization (EUA). This EUA will remain in effect (meaning this test can be used) for the duration of the COVID-19 declaration under Section 564(b)(1) of the Act, 21 U.S.C. section 360bbb-3(b)(1), unless the authorization is terminated or revoked.     Resp Syncytial Virus by PCR NEGATIVE NEGATIVE Final    Comment: (NOTE) Fact Sheet for Patients: BloggerCourse.com  Fact Sheet for Healthcare Providers: SeriousBroker.it  This test is not yet approved or cleared by the [place] FDA and has been authorized for detection and/or diagnosis of SARS-CoV-2 by FDA under an Emergency Use Authorization (EUA). This EUA will remain in effect (meaning this test can be used) for the duration of the COVID-19 declaration under Section 564(b)(1) of the Act, 21 U.S.C. section 360bbb-3(b)(1), unless the authorization is terminated or revoked.  Performed at [facility], [address], [place], [place] [address]     Radiology Studies: ECHOCARDIOGRAM COMPLETE Result Date: 03/20/2023    ECHOCARDIOGRAM REPORT   Patient Name:   Gabriel Love Date of Exam: 03/20/2023 Medical Rec #:  [id number]      Height:       72.0 in Accession #:    [id number]     Weight:       345.7 lb Date of Birth:  10/18/1940       BSA:          2.688 m Patient Age:    82 years       BP:           151/83 mmHg Patient Gender: M              HR:           62 bpm. [facility] Procedure: 2D Echo, Color Doppler, Cardiac Doppler and Intracardiac            Opacification Agent Indications:    CHF I50.31  History:        Patient has prior history of Echocardiogram examinations, most                 recent 11/01/2022.  Sonographer:    Harriette Bouillon RDCS Referring Phys: [id number] OLADAPO ADEFESO IMPRESSIONS  1. Left ventricular ejection fraction, by estimation, is 60 to 65%. The left ventricle has  normal function. The left ventricle has no regional wall motion abnormalities. There is mild concentric left ventricular hypertrophy. Left ventricular diastolic parameters are indeterminate.  2. Right ventricular systolic function is normal. The right ventricular size is normal.  3. The mitral valve is normal in structure. No evidence of mitral valve regurgitation. No evidence of mitral stenosis.  4. The aortic valve is normal in structure. There is mild thickening of the aortic valve. Aortic valve regurgitation is not visualized. No aortic stenosis is present.  5. There is borderline dilatation of the aortic root, measuring 36 mm.  6. The inferior vena cava is normal in size with <50% respiratory variability, suggesting right atrial pressure of 8 mmHg. FINDINGS  Left Ventricle: Left ventricular ejection fraction, by estimation, is 60 to 65%. The left ventricle has normal function. The left ventricle has no regional wall motion abnormalities. The left ventricular internal cavity size was normal in size. There is  mild concentric left ventricular hypertrophy. Left ventricular diastolic parameters are indeterminate. Right Ventricle: The right ventricular size is normal. No increase in right ventricular wall thickness. Right ventricular systolic function is normal. Left Atrium: Left atrial size was normal in size. Right Atrium: Right atrial size was normal in size. Pericardium: There is no evidence of pericardial effusion. Presence of epicardial fat layer. Mitral Valve: The mitral valve is normal in structure. No evidence of mitral valve regurgitation. No evidence of mitral valve stenosis. Tricuspid Valve: The tricuspid valve is normal in structure. Tricuspid valve regurgitation is not demonstrated. No evidence of tricuspid stenosis. Aortic Valve: The aortic valve is normal in structure. There is mild thickening of the aortic valve. There is mild aortic valve annular calcification. Aortic valve regurgitation is not  visualized. No aortic stenosis is present. Aortic valve mean gradient  measures 8.0 mmHg. Aortic valve peak gradient measures 13.7 mmHg. Aortic valve area, by VTI measures 2.09 cm. Pulmonic Valve: The pulmonic valve was normal in structure. Pulmonic valve regurgitation is not visualized. No evidence of pulmonic stenosis. Aorta: There is borderline dilatation of the aortic root, measuring 36 mm. Venous: The inferior vena cava is normal in size with less than 50% respiratory variability, suggesting right atrial pressure of 8 mmHg. IAS/Shunts: No atrial level shunt detected by color flow Doppler.  LEFT VENTRICLE PLAX 2D LVIDd:         4.10 cm   Diastology LVIDs:         2.85 cm   LV e' medial:    5.22 cm/s LV PW:         1.30 cm   LV E/e' medial:  16.7 LV IVS:        1.20 cm   LV e' lateral:   6.64 cm/s LVOT diam:     2.10 cm   LV E/e' lateral: 13.1 LV SV:         86 LV SV Index:   32 LVOT Area:     3.46 cm  RIGHT VENTRICLE         IVC TAPSE (M-mode): 2.8 cm  IVC diam: 2.60 cm LEFT ATRIUM           Index        RIGHT ATRIUM           Index LA diam:      3.40 cm 1.26 cm/m   RA Area:     20.50 cm LA Vol (A4C): 50.8 ml 18.90 ml/m  RA Volume:   56.30 ml  20.94 ml/m  AORTIC VALVE AV Area (Vmax):    1.86 cm AV Area (Vmean):   1.81 cm AV Area (VTI):     2.09 cm AV Vmax:           185.00 cm/s AV Vmean:          129.000 cm/s AV VTI:            0.413 m AV Peak Grad:      13.7 mmHg AV Mean Grad:      8.0 mmHg LVOT Vmax:         99.20 cm/s LVOT Vmean:        67.400 cm/s LVOT VTI:          0.249 m LVOT/AV VTI ratio: 0.60  AORTA Ao Root diam: 2.90 cm Ao Asc diam:  3.60 cm MITRAL VALVE MV Area (PHT): 2.59 cm     SHUNTS MV Decel Time: 293 msec     Systemic VTI:  0.25 m MV E velocity: 87.00 cm/s   Systemic Diam: 2.10 cm MV A velocity: 110.00 cm/s MV E/A ratio:  0.79 Kardie Tobb DO Electronically signed by Thomasene Ripple DO Signature Date/Time: 03/20/2023/3:59:57 PM    Final    DG Chest Port 1 View Result Date:  03/19/2023 CLINICAL DATA:  Shortness of breath. Respiratory distress and hypoxia. EXAM: PORTABLE CHEST 1 VIEW COMPARISON:  Radiograph 01/28/2023.  CT 01/02/2022 FINDINGS: The lungs are hyperinflated. The heart is borderline enlarged but stable. Stable mediastinal contours. Mild central bronchial thickening. Subsegmental atelectasis at the left lung base. No pneumothorax or large pleural effusion. No pulmonary edema. IMPRESSION: 1. Hyperinflation with central bronchial thickening, can be seen with bronchitis or asthma. 2. Subsegmental atelectasis at the left lung base. Electronically Signed   By: Gabriel Love M.D.   On: 03/19/2023 22:09   Scheduled Meds:  aspirin EC  81 mg Oral Daily   atorvastatin  40 mg Oral Daily   [START ON 03/21/2023] azithromycin  250 mg Oral Daily   dextromethorphan-guaiFENesin  1 tablet Oral BID   enoxaparin (LOVENOX) injection  78 mg Subcutaneous Q24H   ipratropium-albuterol  3 mL Nebulization TID   methylPREDNISolone (SOLU-MEDROL) injection  40 mg Intravenous Q12H   metoprolol succinate  50 mg Oral Daily   mometasone-formoterol  2 puff Inhalation BID   pantoprazole  40 mg Oral Daily   phosphorus  250 mg Oral TID   Continuous Infusions:  cefTRIAXone (ROCEPHIN)  IV      LOS: 0 days   Shon Hale M.D on 03/20/2023 at 4:13 PM  Go to [email or web address] - for contact info  [facility] - Office  [phone]  If 7PM-7AM, please contact night-coverage [email or web address] 03/20/2023, 4:13 PM

## 2023-03-21 DIAGNOSIS — J208 Acute bronchitis due to other specified organisms: Secondary | ICD-10-CM | POA: Diagnosis not present

## 2023-03-21 DIAGNOSIS — I10 Essential (primary) hypertension: Secondary | ICD-10-CM | POA: Diagnosis not present

## 2023-03-21 DIAGNOSIS — J4521 Mild intermittent asthma with (acute) exacerbation: Secondary | ICD-10-CM | POA: Diagnosis not present

## 2023-03-21 LAB — BASIC METABOLIC PANEL
Anion gap: 7 (ref 5–15)
BUN: 21 mg/dL (ref 8–23)
CO2: 26 mmol/L (ref 22–32)
Calcium: 8.2 mg/dL — ABNORMAL LOW (ref 8.9–10.3)
Chloride: 104 mmol/L (ref 98–111)
Creatinine, Ser: 0.75 mg/dL (ref 0.61–1.24)
GFR, Estimated: >60 mL/min (ref >60)
Glucose, Bld: 125 mg/dL — ABNORMAL HIGH (ref 70–99)
Potassium: 3.9 mmol/L (ref 3.5–5.1)
Sodium: 137 mmol/L (ref 135–145)

## 2023-03-21 MED ORDER — ENOXAPARIN SODIUM 80 MG/0.8ML IJ SOSY
80.0000 mg | PREFILLED_SYRINGE | INTRAMUSCULAR | Status: DC
  Administered 2023-03-21: 80 mg via SUBCUTANEOUS

## 2023-03-21 MED ORDER — DOXYCYCLINE HYCLATE 100 MG PO TABS: 100.0000 mg | ORAL_TABLET | Freq: Two times a day (BID) | ORAL | 0 refills | Status: AC

## 2023-03-21 MED ORDER — ACETAMINOPHEN 325 MG PO TABS: 650.0000 mg | ORAL_TABLET | Freq: Four times a day (QID) | ORAL | Status: AC | PRN

## 2023-03-21 MED ORDER — DM-GUAIFENESIN ER 30-600 MG PO TB12: 1.0000 | ORAL_TABLET | Freq: Two times a day (BID) | ORAL | 0 refills | Status: AC

## 2023-03-21 MED ORDER — BUDESONIDE-FORMOTEROL FUMARATE 160-4.5 MCG/ACT IN AERO: 2.0000 | INHALATION_SPRAY | Freq: Two times a day (BID) | RESPIRATORY_TRACT | 5 refills | Status: AC

## 2023-03-21 MED ORDER — ALBUTEROL SULFATE HFA 108 (90 BASE) MCG/ACT IN AERS: 2.0000 | INHALATION_SPRAY | RESPIRATORY_TRACT | 5 refills | Status: AC | PRN

## 2023-03-21 MED ORDER — FUROSEMIDE 40 MG PO TABS: 40.0000 mg | ORAL_TABLET | Freq: Two times a day (BID) | ORAL | 5 refills | Status: DC

## 2023-03-21 MED ORDER — PREDNISONE 20 MG PO TABS: 40.0000 mg | ORAL_TABLET | Freq: Every day | ORAL | 0 refills | Status: AC

## 2023-03-21 MED ORDER — ASPIRIN EC 81 MG PO TBEC: 81.0000 mg | DELAYED_RELEASE_TABLET | Freq: Every day | ORAL | 5 refills | Status: AC

## 2023-03-21 NOTE — TOC Transition Note (Signed)
Transition of Care [facility]) - Discharge Note   Patient Details  Name: Gabriel Love MRN: [id number] Date of Birth: 08-10-1940  Transition of Care [facility]) CM/SW Contact:  Beather Arbour Phone Number: 03/21/2023, 12:48 PM   Clinical Narrative:    Patient was admitted for Acute Bronchitis. CSW assessment patient with spouse who was at bedside.Patient lives in a single family home with spouse. Spouse shared that pt has had HH before but could not recall the name . CSW shared that [facility] could assist with HHPT, spouse agreeable. Patient was also recommended for a heavy duty walker, order placed with [employer], and spouse agreeable with delivery to home. TOC signing off.    Final next level of care: Home w Home Health Services Barriers to Discharge: Barriers Resolved   Patient Goals and CMS Choice Patient states their goals for this hospitalization and ongoing recovery are:: return home CMS Medicare.gov Compare Post Acute Care list provided to:: Patient Choice offered to / list presented to : Patient      Discharge Placement             Return Home     Discharge Plan and Services Additional resources added to the After Visit Summary for   In-house Referral: Clinical Social Work   Post Acute Care Choice: Home Health, Durable Medical Equipment          DME Arranged:  (Heavy Duty) DME Agency: [employer] Date DME Agency Contacted: 03/21/23 Time DME Agency Contacted: [phone] Representative spoke with at DME Agency: [facility] HH Arranged: PT HH Agency: [facility] Date [facility] Agency Contacted: 03/21/23 Time HH Agency Contacted: 1244 Representative spoke with at [facility] Agency: Cindie  Social Drivers of Health (SDOH) Interventions SDOH Screenings   Food Insecurity: No Food Insecurity (03/20/2023)  Housing: Low Risk  (03/20/2023)  Transportation Needs: No Transportation Needs (03/20/2023)  Utilities: Not At Risk (03/20/2023)  Depression (PHQ2-9): Medium Risk (03/10/2022)   Tobacco Use: High Risk (03/19/2023)     Readmission Risk Interventions    03/21/2023   12:41 PM  Readmission Risk Prevention Plan  Transportation Screening Complete  Home Care Screening Complete  Medication Review (RN CM) Complete

## 2023-03-21 NOTE — Discharge Instructions (Signed)
1)Very low-salt diet advised---Less than 2 gm of Sodium per day advised----ok to use Mrs DASH salt substitute 2)Weigh yourself daily, call if you gain more than 3 pounds in 1 day or more than 5 pounds in 1 week as your diuretic medications may need to be adjusted 3)Please Take Medications as Prescribed

## 2023-03-21 NOTE — Plan of Care (Signed)

## 2023-03-21 NOTE — Evaluation (Signed)
Physical Therapy Evaluation Patient Details Name: Gabriel Love MRN: [id number] DOB: 1940-09-21 Today's Date: 03/21/2023  History of Present Illness  Gabriel Love is a 82 y.o. male with medical history significant of hypertension, chronic diastolic CHF, asthma, prior tobacco use, morbid obesity, depression who presents to the emergency department due to several weeks of on and off shortness of breath which rapidly worsened this afternoon (about 1 hour PTA).  He used his inhaler twice without much improvement in shortness of breath.  Patient states that he stopped using his Lasix since last 2 days due to trying to avoid increased urinary frequency during the Christmastime.  EMS was activated, on arrival of EMS the, he was treated with Solu-Medrol and placed on CPAP at FiO2 of 40% with improved O2 sat at 99%.  Patient endorsed chronic lower extremity swelling which remains at baseline and denies any chest pain.   Clinical Impression  Patient required assist for donning his walking boot for old left ankle fracture with deformity.  Patient demonstrates labored movement sitting up at bedside with spouse assisting, good return for transferring to/from chair and ambulating in room/hallway without loss of balance. Patient ambulated on room air with SpO2 at 92% and tolerated sitting up in chair after therapy. Patient will benefit from continued skilled physical therapy in hospital and recommended venue below to increase strength, balance, endurance for safe ADLs and gait.         If plan is discharge home, recommend the following: A little help with walking and/or transfers;A little help with bathing/dressing/bathroom;Help with stairs or ramp for entrance;Assistance with cooking/housework   Can travel by private vehicle        Equipment Recommendations Rolling walker (2 wheels);Other (comment) (Heavy Duty Rolling Walker (2 wheels))  Recommendations for Other Services       Functional Status  Assessment Patient has had a recent decline in their functional status and demonstrates the ability to make significant improvements in function in a reasonable and predictable amount of time.     Precautions / Restrictions Precautions Precautions: Fall Restrictions Weight Bearing Restrictions Per Provider Order: No      Mobility  Bed Mobility Overal bed mobility: Needs Assistance Bed Mobility: Supine to Sit     Supine to sit: Min assist, HOB elevated     General bed mobility comments: increased time, labored movement with HOB raised    Transfers Overall transfer level: Needs assistance Equipment used: Rolling walker (2 wheels) Transfers: Sit to/from Stand, Bed to chair/wheelchair/BSC Sit to Stand: Contact guard assist   Step pivot transfers: Contact guard assist       General transfer comment: increased time, labored movement    Ambulation/Gait Ambulation/Gait assistance: Contact guard assist Gait Distance (Feet): 45 Feet Assistive device: Rolling walker (2 wheels) Gait Pattern/deviations: Decreased step length - right, Decreased step length - left, Decreased stride length Gait velocity: decreased     General Gait Details: slow labored cadence without loss of balance, limited due to fatigue, on room air with SpO2 at 92%  Stairs            Wheelchair Mobility     Tilt Bed    Modified Rankin (Stroke Patients Only)       Balance Overall balance assessment: Needs assistance Sitting-balance support: Feet supported, No upper extremity supported Sitting balance-Leahy Scale: Fair Sitting balance - Comments: fair/good seated at EOB   Standing balance support: During functional activity, Reliant on assistive device for balance, Bilateral upper extremity supported Standing  balance-Leahy Scale: Fair Standing balance comment: fair/good using RW                             Pertinent Vitals/Pain Pain Assessment Pain Assessment: No/denies pain     Home Living Family/patient expects to be discharged to:: Private residence Living Arrangements: Spouse/significant other Available Help at Discharge: Available 24 hours/day Type of Home: House Home Access: Ramped entrance       Home Layout: One level Home Equipment: Agricultural consultant (2 wheels);Rollator (4 wheels);Shower seat;BSC/3in1;Grab bars - tub/shower      Prior Function Prior Level of Function : Needs assist       Physical Assist : Mobility (physical);ADLs (physical) Mobility (physical): Bed mobility;Transfers;Gait;Stairs   Mobility Comments: Household ambulation using RW in home, Rollator for longer distances ADLs Comments: Assisted by family     Extremity/Trunk Assessment   Upper Extremity Assessment Upper Extremity Assessment: Overall WFL for tasks assessed    Lower Extremity Assessment Lower Extremity Assessment: Generalized weakness    Cervical / Trunk Assessment Cervical / Trunk Assessment: Kyphotic  Communication   Communication Communication: No apparent difficulties  Cognition Arousal: Alert Behavior During Therapy: WFL for tasks assessed/performed Overall Cognitive Status: Within Functional Limits for tasks assessed                                          General Comments      Exercises     Assessment/Plan    PT Assessment Patient needs continued PT services  PT Problem List Decreased strength;Decreased activity tolerance;Decreased balance;Decreased mobility       PT Treatment Interventions DME instruction;Gait training;Stair training;Functional mobility training;Therapeutic activities;Therapeutic exercise;Balance training;Patient/family education    PT Goals (Current goals can be found in the Care Plan section)  Acute Rehab PT Goals Patient Stated Goal: return home with family to assist PT Goal Formulation: With patient/family Time For Goal Achievement: 03/25/23 Potential to Achieve Goals: Good    Frequency Min  3X/week     Co-evaluation               AM-PAC PT "6 Clicks" Mobility  Outcome Measure Help needed turning from your back to your side while in a flat bed without using bedrails?: None Help needed moving from lying on your back to sitting on the side of a flat bed without using bedrails?: A Little Help needed moving to and from a bed to a chair (including a wheelchair)?: A Little Help needed standing up from a chair using your arms (e.g., wheelchair or bedside chair)?: A Little Help needed to walk in hospital room?: A Little Help needed climbing 3-5 steps with a railing? : A Lot 6 Click Score: 18    End of Session   Activity Tolerance: Patient tolerated treatment well;Patient limited by fatigue Patient left: in chair;with call bell/phone within reach;with family/visitor present Nurse Communication: Mobility status PT Visit Diagnosis: Unsteadiness on feet (R26.81);Other abnormalities of gait and mobility (R26.89);Muscle weakness (generalized) (M62.81)    Time: 2440-1027 PT Time Calculation (min) (ACUTE ONLY): 29 min   Charges:   PT Evaluation $PT Eval Moderate Complexity: 1 Mod PT Treatments $Therapeutic Activity: 23-37 mins PT General Charges $$ ACUTE PT VISIT: 1 Visit         12:25 PM, 03/21/23 Ocie Bob, MPT Physical Therapist with [facility] [phone] office  [phone] mobile phone

## 2023-03-21 NOTE — Plan of Care (Signed)
  Problem: Acute Rehab PT Goals(only PT should resolve) Goal: Pt Will Go Supine/Side To Sit Outcome: Progressing Flowsheets (Taken 03/21/2023 1226) Pt will go Supine/Side to Sit:  with contact guard assist  with supervision Goal: Patient Will Transfer Sit To/From Stand Outcome: Progressing Flowsheets (Taken 03/21/2023 1226) Patient will transfer sit to/from stand: with supervision Goal: Pt Will Transfer Bed To Chair/Chair To Bed Outcome: Progressing Flowsheets (Taken 03/21/2023 1226) Pt will Transfer Bed to Chair/Chair to Bed: with supervision Goal: Pt Will Ambulate Outcome: Progressing Flowsheets (Taken 03/21/2023 1226) Pt will Ambulate:  75 feet  with supervision  with rolling walker   12:27 PM, 03/21/23 Ocie Bob, MPT Physical Therapist with [facility] [phone] office [phone] mobile phone

## 2023-03-21 NOTE — Discharge Summary (Signed)
RAYMEL VANDONGEN, is a 82 y.o. male  DOB 16-Jun-1940  MRN [id number].  Admission date:  03/19/2023  Admitting Physician  Shon Hale, MD  Discharge Date:  03/21/2023   Primary MD  Nelwyn Salisbury, MD  Recommendations for primary care physician for things to follow:  1)Very low-salt diet advised---Less than 2 gm of Sodium per day advised----ok to use Mrs DASH salt substitute 2)Weigh yourself daily, call if you gain more than 3 pounds in 1 day or more than 5 pounds in 1 week as your diuretic medications may need to be adjusted 3)Please Take Medications as Prescribed  Admission Diagnosis  Acute exacerbation of chronic obstructive pulmonary disease (COPD) (HCC) [J44.1] COPD exacerbation (HCC) [J44.1] Acute respiratory failure with hypercapnia (HCC) [J96.02] Acute respiratory failure with hypoxia (HCC) [J96.01]   Discharge Diagnosis  Acute exacerbation of chronic obstructive pulmonary disease (COPD) (HCC) [J44.1] COPD exacerbation (HCC) [J44.1] Acute respiratory failure with hypercapnia (HCC) [J96.02] Acute respiratory failure with hypoxia (HCC) [J96.01]    Principal Problem:   Acute bronchitis Active Problems:   Mixed hyperlipidemia   Essential hypertension   Obesity, Class III, BMI 40-49.9 (morbid obesity) (HCC)   Asthma exacerbation   Acute respiratory failure with hypoxia and hypercapnia (HCC)   Elevated brain natriuretic peptide (BNP) level   Acute respiratory failure with hypoxia (HCC)      Past Medical History:  Diagnosis Date   Abdominal hernia    Allergy    Blood transfusion without reported diagnosis 12/2006   At [facility] with hip replacement - 2 units transfused   Cancer (HCC)    skin cancers   Cataract    Depression    DJD (degenerative joint disease)    shoulders bilateral   Environmental allergies    Full dentures    Grade I diastolic dysfunction 05/08/2020   Hearing loss     bilateral hearing aids   History of colonic polyps    History of urinary tract infection    Hyperlipidemia    under control   Hypertension    "borderline"   Nonspecific abnormal finding in stool contents    Other general symptoms(780.99)    Prostate hypertrophy    sees Dr. Patsi Sears    Urinary leakage     Past Surgical History:  Procedure Laterality Date   CATARACT EXTRACTION W/PHACO Right 03/25/2014   Procedure: CATARACT EXTRACTION PHACO AND INTRAOCULAR LENS PLACEMENT; CDE:9.76;  Surgeon: Susa Simmonds, MD;  Location: [facility];  Service: Ophthalmology;  Laterality: Right;   CATARACT EXTRACTION W/PHACO Left 06/24/2014   Procedure: CATARACT EXTRACTION PHACO AND INTRAOCULAR LENS PLACEMENT (IOC);  Surgeon: Susa Simmonds, MD;  Location: [facility];  Service: Ophthalmology;  Laterality: Left;  CDE:8.45   CIRCUMCISION N/A 03/26/2016   Procedure: CIRCUMCISION ADULT;  Surgeon: Jethro Bolus, MD;  Location: [facility];  Service: Urology;  Laterality: N/AAlycia Patten, NON-NEWBORN  1985   COLONOSCOPY  06-19-13, 03/05/15   per Dr. Arlyce Dice, mass at the ileocecal valve plus several polyps, pathology benign, repeat in one  year, polyp 2016    COLONOSCOPY WITH PROPOFOL N/A 07/29/2015   Procedure: COLONOSCOPY WITH PROPOFOL;  Surgeon: Ruffin Frederick, MD;  Location: [facility];  Service: Gastroenterology;  Laterality: N/A;   JOINT REPLACEMENT  12/2006   total left hip per Dr. Eulah Pont    LAPAROSCOPIC PARTIAL COLECTOMY N/A 08/07/2013   Procedure: LAPAROSCOPIC right PARTIAL COLECTOMY;  Surgeon: Romie Levee, MD;  Location: [facility];  Service: General;  Laterality: N/A;   MULTIPLE TOOTH EXTRACTIONS     PROSTATE SURGERY  03-29-11   had CTT per Dr. Patsi Sears, in office     HPI  from the history and physical done on the day of admission:     HPI: ZAILYN REPPUCCI is a 82 y.o. male with medical history significant of hypertension, chronic diastolic CHF, asthma, prior tobacco use, morbid obesity,  depression who presents to the emergency department due to several weeks of on and off shortness of breath which rapidly worsened this afternoon (about 1 hour PTA).  He used his inhaler twice without much improvement in shortness of breath.  Patient states that he stopped using his Lasix since last 2 days due to trying to avoid increased urinary frequency during the Christmastime.  EMS was activated, on arrival of EMS the, he was treated with Solu-Medrol and placed on CPAP at FiO2 of 40% with improved O2 sat at 99%.  Patient endorsed chronic lower extremity swelling which remains at baseline and denies any chest pain.   ED Course:  In the emergency department, BP was 159/95, other vital signs were within normal range.  Workup in the ED showed normal CBC and BMP except for blood glucose of 101.  VBG showed pH of 7.33, but pCO2 of 61, pO2 less than 31 mmHg, bicarb 32.7.  Troponin x 2 was flat at 6, BNP 160 (this was 85 about a month ago).  Influenza A, B, SARS coronavirus 2, RSV was negative. Chest x-ray showed hyperinflation with central bronchial thickening, can be seen with bronchitis or asthma.  Subsegmental atelectasis at the left lung base. Patient was provided with breathing treatment with albuterol and Atrovent.  IV magnesium 2 g x 1 was given Hospitalist was asked to admit patient for further evaluation and management.   Review of Systems: Review of systems as noted in the HPI. All other systems reviewed and are negative.    Hospital Course:     Brief Narrative:  82 y.o. male with medical history significant of hypertension, chronic diastolic CHF, asthma, prior tobacco use, morbid obesity, depression admitted on 03/19/2023 with acute hypoxic respiratory failure in the setting of acute bronchitis     -Assessment and Plan: 1) acute hypoxic respiratory failure--- in the setting of acute bronchitis -Patient initially required BiPAP--respiratory status improving with interventions --Treated  with bronchodilators mucolytic's Solu-Medrol Rocephin/azithromycin -- Incentive spirometry advised (chest x-ray with atelectasis and central bronchial thickening) -Flu, RSV and COVID-negative No leukocytosis -Weaned off oxygen completely---O2/post ambulation on room air is 92 to 93% -Respiratory status has improved significantly per patient's wife back to baseline -Okay to discharge on doxycycline and prednisone   2)History of mild intermittent asthma--patient appears to be having asthma flareup due to acute bronchitis -Much improved with Solu-Medrol and bronchodilators as above #1 -Please see 1 above   3) chronic diastolic dysfunction CHF--- -stable, no acute exacerbation, -BNP is mild elevated at 160, chest x-ray without CHF type findings -Echo from 03/20/2023 with EF of 60 to 65%, no regional wall motion normalities -No mitral  stenosis, no aortic stenosis -- resume PTA Lasix and metoprolol     4)Morbid Obesity- -Low calorie diet, portion control and increase physical activity discussed with patient -Body mass index is 46.88 kg/m.    Disposition: The patient is from: Home              Anticipated d/c is to: Home     Discharge Condition: -stable  Follow UP   Follow-up Information     [facility] Follow up.   Why: This agency will reach out to you to start services for Physical Therapy.        Nelwyn Salisbury, MD. Schedule an appointment as soon as possible for a visit in 1 week(s).   Specialty: Family Medicine Why: Repeat BMP and CBC Blood Tests Contact information: [address] [place] [address] [phone]                 Diet and Activity recommendation:  As advised  Discharge Instructions    Discharge Instructions     Call MD for:  difficulty breathing, headache or visual disturbances   Complete by: As directed    Call MD for:  persistant dizziness or light-headedness   Complete by: As directed    Call MD for:  persistant  nausea and vomiting   Complete by: As directed    Call MD for:  temperature >100.4   Complete by: As directed    Diet - low sodium heart healthy   Complete by: As directed    Discharge instructions   Complete by: As directed    1)Very low-salt diet advised---Less than 2 gm of Sodium per day advised----ok to use Mrs DASH salt substitute 2)Weigh yourself daily, call if you gain more than 3 pounds in 1 day or more than 5 pounds in 1 week as your diuretic medications may need to be adjusted 3)Please Take Medications as Prescribed   Increase activity slowly   Complete by: As directed         Discharge Medications     Allergies as of 03/21/2023       Reactions   Ivp Dye [iodinated Contrast Media] Anaphylaxis   Iv dye allergy in 1985, anaphylaxix//a.calhoun- "shocked back to life"   Penicillins Other (See Comments)   Urticaria Has patient had a PCN reaction causing immediate rash, facial/tongue/throat swelling, SOB or lightheadedness with hypotension: no Has patient had a PCN reaction causing severe rash involving mucus membranes or skin necrosis: no Has patient had a PCN reaction that required hospitalization no Has patient had a PCN reaction occurring within the last 10 years: yes If all of the above answers are "NO", then may proceed with Cephalosporin use.   Lisinopril Cough        Medication List     STOP taking these medications    azithromycin 250 MG tablet Commonly known as: ZITHROMAX   mupirocin cream 2 % Commonly known as: Bactroban   promethazine-dextromethorphan 6.25-15 MG/5ML syrup Commonly known as: PROMETHAZINE-DM   VITAMIN E PO       TAKE these medications    acetaminophen 325 MG tablet Commonly known as: TYLENOL Take 2 tablets (650 mg total) by mouth every 6 (six) hours as needed for mild pain (pain score 1-3), headache or fever (or Fever >/= 101).   albuterol 108 (90 Base) MCG/ACT inhaler Commonly known as: VENTOLIN HFA Inhale 2 puffs into  the lungs every 4 (four) hours as needed for wheezing or shortness of  breath. What changed:  how much to take when to take this   aspirin EC 81 MG tablet Take 1 tablet (81 mg total) by mouth daily with breakfast. What changed: when to take this   atorvastatin 40 MG tablet Commonly known as: LIPITOR TAKE 1 TABLET BY MOUTH DAILY   budesonide-formoterol 160-4.5 MCG/ACT inhaler Commonly known as: Symbicort Inhale 2 puffs into the lungs in the morning and at bedtime.   CENTRUM ADULTS PO Take 1 tablet by mouth daily.   dextromethorphan-guaiFENesin 30-600 MG 12hr tablet Commonly known as: MUCINEX DM Take 1 tablet by mouth 2 (two) times daily for 10 days.   doxycycline 100 MG tablet Commonly known as: VIBRA-TABS Take 1 tablet (100 mg total) by mouth 2 (two) times daily for 5 days.   escitalopram 20 MG tablet Commonly known as: Lexapro TAKE 1 TABLET BY MOUTH EVERY DAY What changed:  how much to take how to take this when to take this additional instructions   furosemide 40 MG tablet Commonly known as: LASIX Take 1 tablet (40 mg total) by mouth 2 (two) times daily. What changed: See the new instructions.   metoprolol succinate 50 MG 24 hr tablet Commonly known as: TOPROL-XL TAKE 1 TABLET BY MOUTH EVERY DAY   potassium chloride SA 20 MEQ tablet Commonly known as: KLOR-CON M TAKE 1 TABLET(20 MEQ) BY MOUTH DAILY What changed: See the new instructions.   predniSONE 20 MG tablet Commonly known as: DELTASONE Take 2 tablets (40 mg total) by mouth daily with breakfast for 4 days. Start taking on: March 22, 2023   VITAMIN D3 PO Take 1 tablet by mouth daily at 12 noon.               Durable Medical Equipment  (From admission, onward)           Start     Ordered   03/21/23 1207  For home use only DME Walker  Once       Comments: Heavy-duty walker  Question:  Patient needs a walker to treat with the following condition  Answer:  Weakness   03/21/23 1207    03/21/23 1153  For home use only DME Walker rolling  Once       Comments: Patient unsteady on feet and at risk for falls, patient will benefit from use of Heavy Duty Rolling Walker for safety with good return for use demonstrated.  Question Answer Comment  Walker: With 5 Inch Wheels   Patient needs a walker to treat with the following condition Gait difficulty      03/21/23 1153            Major procedures and Radiology Reports - PLEASE review detailed and final reports for all details, in brief -   ECHOCARDIOGRAM COMPLETE Result Date: 03/20/2023    ECHOCARDIOGRAM REPORT   Patient Name:   KENICHI TREU Jiles Date of Exam: 03/20/2023 Medical Rec #:  [id number]      Height:       72.0 in Accession #:    [id number]     Weight:       345.7 lb Date of Birth:  [birth year]       BSA:          2.688 m Patient Age:    82 years       BP:           151/83 mmHg Patient Gender: M  HR:           62 bpm. [facility] Procedure: 2D Echo, Color Doppler, Cardiac Doppler and Intracardiac            Opacification Agent Indications:    CHF I50.31  History:        Patient has prior history of Echocardiogram examinations, most                 recent 11/01/2022.  Sonographer:    Harriette Bouillon RDCS Referring Phys: [id number] OLADAPO ADEFESO IMPRESSIONS  1. Left ventricular ejection fraction, by estimation, is 60 to 65%. The left ventricle has normal function. The left ventricle has no regional wall motion abnormalities. There is mild concentric left ventricular hypertrophy. Left ventricular diastolic parameters are indeterminate.  2. Right ventricular systolic function is normal. The right ventricular size is normal.  3. The mitral valve is normal in structure. No evidence of mitral valve regurgitation. No evidence of mitral stenosis.  4. The aortic valve is normal in structure. There is mild thickening of the aortic valve. Aortic valve regurgitation is not visualized. No aortic stenosis is present.  5. There  is borderline dilatation of the aortic root, measuring 36 mm.  6. The inferior vena cava is normal in size with <50% respiratory variability, suggesting right atrial pressure of 8 mmHg. FINDINGS  Left Ventricle: Left ventricular ejection fraction, by estimation, is 60 to 65%. The left ventricle has normal function. The left ventricle has no regional wall motion abnormalities. The left ventricular internal cavity size was normal in size. There is  mild concentric left ventricular hypertrophy. Left ventricular diastolic parameters are indeterminate. Right Ventricle: The right ventricular size is normal. No increase in right ventricular wall thickness. Right ventricular systolic function is normal. Left Atrium: Left atrial size was normal in size. Right Atrium: Right atrial size was normal in size. Pericardium: There is no evidence of pericardial effusion. Presence of epicardial fat layer. Mitral Valve: The mitral valve is normal in structure. No evidence of mitral valve regurgitation. No evidence of mitral valve stenosis. Tricuspid Valve: The tricuspid valve is normal in structure. Tricuspid valve regurgitation is not demonstrated. No evidence of tricuspid stenosis. Aortic Valve: The aortic valve is normal in structure. There is mild thickening of the aortic valve. There is mild aortic valve annular calcification. Aortic valve regurgitation is not visualized. No aortic stenosis is present. Aortic valve mean gradient  measures 8.0 mmHg. Aortic valve peak gradient measures 13.7 mmHg. Aortic valve area, by VTI measures 2.09 cm. Pulmonic Valve: The pulmonic valve was normal in structure. Pulmonic valve regurgitation is not visualized. No evidence of pulmonic stenosis. Aorta: There is borderline dilatation of the aortic root, measuring 36 mm. Venous: The inferior vena cava is normal in size with less than 50% respiratory variability, suggesting right atrial pressure of 8 mmHg. IAS/Shunts: No atrial level shunt detected by  color flow Doppler.  LEFT VENTRICLE PLAX 2D LVIDd:         4.10 cm   Diastology LVIDs:         2.85 cm   LV e' medial:    5.22 cm/s LV PW:         1.30 cm   LV E/e' medial:  16.7 LV IVS:        1.20 cm   LV e' lateral:   6.64 cm/s LVOT diam:     2.10 cm   LV E/e' lateral: 13.1 LV SV:  86 LV SV Index:   32 LVOT Area:     3.46 cm  RIGHT VENTRICLE         IVC TAPSE (M-mode): 2.8 cm  IVC diam: 2.60 cm LEFT ATRIUM           Index        RIGHT ATRIUM           Index LA diam:      3.40 cm 1.26 cm/m   RA Area:     20.50 cm LA Vol (A4C): 50.8 ml 18.90 ml/m  RA Volume:   56.30 ml  20.94 ml/m  AORTIC VALVE AV Area (Vmax):    1.86 cm AV Area (Vmean):   1.81 cm AV Area (VTI):     2.09 cm AV Vmax:           185.00 cm/s AV Vmean:          129.000 cm/s AV VTI:            0.413 m AV Peak Grad:      13.7 mmHg AV Mean Grad:      8.0 mmHg LVOT Vmax:         99.20 cm/s LVOT Vmean:        67.400 cm/s LVOT VTI:          0.249 m LVOT/AV VTI ratio: 0.60  AORTA Ao Root diam: 2.90 cm Ao Asc diam:  3.60 cm MITRAL VALVE MV Area (PHT): 2.59 cm     SHUNTS MV Decel Time: 293 msec     Systemic VTI:  0.25 m MV E velocity: 87.00 cm/s   Systemic Diam: 2.10 cm MV A velocity: 110.00 cm/s MV E/A ratio:  0.79 Kardie Tobb DO Electronically signed by Thomasene Ripple DO Signature Date/Time: 03/20/2023/3:59:57 PM    Final    DG Chest Port 1 View Result Date: 03/19/2023 CLINICAL DATA:  Shortness of breath. Respiratory distress and hypoxia. EXAM: PORTABLE CHEST 1 VIEW COMPARISON:  Radiograph 01/28/2023.  CT 01/02/2022 FINDINGS: The lungs are hyperinflated. The heart is borderline enlarged but stable. Stable mediastinal contours. Mild central bronchial thickening. Subsegmental atelectasis at the left lung base. No pneumothorax or large pleural effusion. No pulmonary edema. IMPRESSION: 1. Hyperinflation with central bronchial thickening, can be seen with bronchitis or asthma. 2. Subsegmental atelectasis at the left lung base. Electronically Signed    By: Narda Rutherford M.D.   On: 03/19/2023 22:09    Micro Results   Recent Results (from the past 240 hours)  Resp panel by RT-PCR (RSV, Flu A&B, Covid) Anterior Nasal Swab     Status: None   Collection Time: 03/19/23  9:46 PM   Specimen: Anterior Nasal Swab  Result Value Ref Range Status   SARS Coronavirus 2 by RT PCR NEGATIVE NEGATIVE Final    Comment: (NOTE) SARS-CoV-2 target nucleic acids are NOT DETECTED.  The SARS-CoV-2 RNA is generally detectable in upper respiratory specimens during the acute phase of infection. The lowest concentration of SARS-CoV-2 viral copies this assay can detect is 138 copies/mL. A negative result does not preclude SARS-Cov-2 infection and should not be used as the sole basis for treatment or other patient management decisions. A negative result may occur with  improper specimen collection/handling, submission of specimen other than nasopharyngeal swab, presence of viral mutation(s) within the areas targeted by this assay, and inadequate number of viral copies(<138 copies/mL). A negative result must be combined with clinical observations, patient history, and epidemiological information. The expected result  is Negative.  Fact Sheet for Patients:  BloggerCourse.com  Fact Sheet for Healthcare Providers:  SeriousBroker.it  This test is no t yet approved or cleared by the [place] FDA and  has been authorized for detection and/or diagnosis of SARS-CoV-2 by FDA under an Emergency Use Authorization (EUA). This EUA will remain  in effect (meaning this test can be used) for the duration of the COVID-19 declaration under Section 564(b)(1) of the Act, 21 U.S.C.section 360bbb-3(b)(1), unless the authorization is terminated  or revoked sooner.       Influenza A by PCR NEGATIVE NEGATIVE Final   Influenza B by PCR NEGATIVE NEGATIVE Final    Comment: (NOTE) The Xpert Xpress SARS-CoV-2/FLU/RSV plus  assay is intended as an aid in the diagnosis of influenza from Nasopharyngeal swab specimens and should not be used as a sole basis for treatment. Nasal washings and aspirates are unacceptable for Xpert Xpress SARS-CoV-2/FLU/RSV testing.  Fact Sheet for Patients: BloggerCourse.com  Fact Sheet for Healthcare Providers: SeriousBroker.it  This test is not yet approved or cleared by the [place] FDA and has been authorized for detection and/or diagnosis of SARS-CoV-2 by FDA under an Emergency Use Authorization (EUA). This EUA will remain in effect (meaning this test can be used) for the duration of the COVID-19 declaration under Section 564(b)(1) of the Act, 21 U.S.C. section 360bbb-3(b)(1), unless the authorization is terminated or revoked.     Resp Syncytial Virus by PCR NEGATIVE NEGATIVE Final    Comment: (NOTE) Fact Sheet for Patients: BloggerCourse.com  Fact Sheet for Healthcare Providers: SeriousBroker.it  This test is not yet approved or cleared by the [place] FDA and has been authorized for detection and/or diagnosis of SARS-CoV-2 by FDA under an Emergency Use Authorization (EUA). This EUA will remain in effect (meaning this test can be used) for the duration of the COVID-19 declaration under Section 564(b)(1) of the Act, 21 U.S.C. section 360bbb-3(b)(1), unless the authorization is terminated or revoked.  Performed at [facility], [address] [place], [place] [address]     Today   Subjective    Quinzell Follett today has no new concerns No fever  Or chills  -Wife at bedside, questions answered Ambulatory with physical therapist without desaturation on room air        Patient has been seen and examined prior to discharge   Objective   Blood pressure (!) 141/60, pulse 69, temperature 98.2 F (36.8 C), temperature source Oral, resp. rate 20, height 6'  (1.829 m), weight (!) 156.8 kg, SpO2 94%.   Intake/Output Summary (Last 24 hours) at 03/21/2023 1330 Last data filed at 03/21/2023 0933 Gross per 24 hour  Intake 565.64 ml  Output 1550 ml  Net -984.36 ml   Exam Gen:- Awake Alert, no acute distress , no conversational dyspnea HEENT:- [place].AT, No sclera icterus Neck-Supple Neck,No JVD,.  Lungs-improved air movement, no wheezing CV- S1, S2 normal, regular Abd-  +ve B.Sounds, Abd Soft, No tenderness, increased truncal adiposity Extremity/Skin:- +ve edema, chronic venous stasis, good pulses Psych-affect is appropriate, oriented x3 Neuro-generalized weakness, no new focal deficits, no tremors    Data Review   CBC w Diff:  Lab Results  Component Value Date   WBC 8.6 03/20/2023   HGB 15.2 03/20/2023   HCT 48.3 03/20/2023   PLT 166 03/20/2023   LYMPHOPCT 16 01/28/2023   MONOPCT 10 01/28/2023   EOSPCT 1 01/28/2023   BASOPCT 1 01/28/2023    CMP:  Lab Results  Component Value Date  NA 137 03/21/2023   NA 141 07/15/2022   K 3.9 03/21/2023   CL 104 03/21/2023   CO2 26 03/21/2023   BUN 21 03/21/2023   BUN 19 07/15/2022   CREATININE 0.75 03/21/2023   CREATININE 0.78 01/21/2020   PROT 6.1 (L) 03/20/2023   ALBUMIN 3.3 (L) 03/20/2023   BILITOT 0.6 03/20/2023   ALKPHOS 74 03/20/2023   AST 25 03/20/2023   ALT 19 03/20/2023  .  Total Discharge time is about 33 minutes  Shon Hale M.D on 03/21/2023 at 1:30 PM  Go to [email or web address] -  for contact info  [facility] - Office  [phone]

## 2023-03-21 NOTE — Progress Notes (Signed)
SATURATION QUALIFICATIONS: (This note is used to comply with regulatory documentation for home oxygen)  Patient Saturations on Room Air at Rest = 92%  Patient Saturations on Room Air while Ambulating = 92%  Patient Saturations on 0 Liters of oxygen while Ambulating = 92%  Please briefly explain why patient needs home oxygen: PT walked pt and stated pt did not need any Oxygen during ambulation.

## 2023-03-21 NOTE — TOC CM/SW Note (Addendum)
Patient needs a Heavy Duty walker for generalized weakness/Ambulatory dysfunction in the setting of morbid obesity with a BMI of 46 --weight 345 lb  -Body mass index is 46.88 kg/m.  Shon Hale, MD

## 2023-03-22 ENCOUNTER — Telehealth: Payer: Self-pay

## 2023-03-22 NOTE — Transitions of Care (Post Inpatient/ED Visit) (Signed)
 03/22/2023  Name: Gabriel Love MRN: [id number] DOB: 1941-03-17  Today's TOC FU Call Status: Today's TOC FU Call Status:: Successful TOC FU Call Completed TOC FU Call Complete Date: 03/22/23 (call completed with wife-who is not at home with pt at present) Patient's Name and Date of Birth confirmed.  Transition Care Management Follow-up Telephone Call Date of Discharge: 03/21/23 [facility] Type of Discharge: Inpatient Admission Primary Inpatient Discharge Diagnosis:: COPD exacerbation How have you been since you were released from the hospital?: Better (Wife voices pt is doing ok- he is coughing up clear/yellow sputum-she is at store picking up new meds now) Any questions or concerns?: No  Items Reviewed: Did you receive and understand the discharge instructions provided?: Yes Medications obtained,verified, and reconciled?: Partial Review Completed Reason for Partial Mediation Review: reviewed and discussed with wife new meds, changed meds and d/c meds per d/c instructions as she is currently not at home with meds or med list-she confirmed understanding Any new allergies since your discharge?: No Dietary orders reviewed?: Yes Type of Diet Ordered:: low salt-2g/day- heart healthy diet Do you have support at home?: Yes People in Home: spouse Name of Support/Comfort Primary Source: [place]  Medications Reviewed Today: Medications Reviewed Today     Reviewed by Rochel Rama Loose, RN (Registered Nurse) on 03/22/23 at 1014  Med List Status: <None>   Medication Order Taking? Sig Documenting Provider Last Dose Status Informant  acetaminophen  (TYLENOL ) 325 MG tablet [id number]  Take 2 tablets (650 mg total) by mouth every 6 (six) hours as needed for mild pain (pain score 1-3), headache or fever (or Fever >/= 101). Pearlean Manus, MD  Active   albuterol  (VENTOLIN  HFA) 108 (90 Base) MCG/ACT inhaler [id number]  Inhale 2 puffs into the lungs every 4 (four)  hours as needed for wheezing or shortness of breath. Pearlean Manus, MD  Active   aspirin  EC 81 MG tablet [id number]  Take 1 tablet (81 mg total) by mouth daily with breakfast. Pearlean Manus, MD  Active   atorvastatin  (LIPITOR) 40 MG tablet [id number] No TAKE 1 TABLET BY MOUTH DAILY Johnny Garnette LABOR, MD 03/19/2023 Morning Active Spouse/Significant Other  budesonide -formoterol  (SYMBICORT ) 160-4.5 MCG/ACT inhaler [id number]  Inhale 2 puffs into the lungs in the morning and at bedtime. Pearlean Manus, MD  Active   Cholecalciferol (VITAMIN D3 PO) [id number] No Take 1 tablet by mouth daily at 12 noon.  Patient not taking: Reported on 03/20/2023   [provider] Not Taking Active Spouse/Significant Other  dextromethorphan -guaiFENesin  (MUCINEX  DM) 30-600 MG 12hr tablet [id number]  Take 1 tablet by mouth 2 (two) times daily for 10 days. Pearlean Manus, MD  Active   doxycycline  (VIBRA -TABS) 100 MG tablet [id number]  Take 1 tablet (100 mg total) by mouth 2 (two) times daily for 5 days. Pearlean Manus, MD  Active   escitalopram  (LEXAPRO ) 20 MG tablet [id number] No TAKE 1 TABLET BY MOUTH EVERY DAY  Patient taking differently: Take 20 mg by mouth daily.   Johnny Garnette LABOR, MD 03/19/2023 Morning Active Spouse/Significant Other  furosemide  (LASIX ) 40 MG tablet [id number]  Take 1 tablet (40 mg total) by mouth 2 (two) times daily. Pearlean Manus, MD  Active   metoprolol  succinate (TOPROL -XL) 50 MG 24 hr tablet [id number] No TAKE 1 TABLET BY MOUTH EVERY DAY  Patient taking differently: Take 50 mg by mouth daily.   Johnny Garnette LABOR, MD 03/19/2023 10:00 AM Active Spouse/Significant Other  Multiple Vitamins-Minerals (CENTRUM ADULTS PO) [id number] No Take 1  tablet by mouth daily. [provider] Past Month Active Spouse/Significant Other  potassium chloride  SA (KLOR-CON  M) 20 MEQ tablet [id number] No TAKE 1 TABLET(20 MEQ) BY MOUTH DAILY  Patient taking differently: Take 20 mEq by mouth daily.   Johnny Garnette LABOR, MD 03/20/2023 Morning Active Spouse/Significant Other  predniSONE  (DELTASONE ) 20 MG tablet [id number]  Take 2 tablets (40 mg total) by mouth daily with breakfast for 4 days. Pearlean Manus, MD  Active             Home Care and Equipment/Supplies: Were Home Health Services Ordered?: Yes Name of Home Health Agency:: [facility] Has Agency set up a time to come to your home?: No (wife aware to expect call from agency within 48hrs post d/c-confirmed they have contact info and advised to call if they do not hear from agency) Any new equipment or medical supplies ordered?: Yes Name of Medical supply agency?: [employer]- walker Were you able to get the equipment/medical supplies?: No (wife states agency called and will be shipping out DME) Do you have any questions related to the use of the equipment/supplies?: No  Functional Questionnaire: Do you need assistance with bathing/showering or dressing?: No Do you need assistance with meal preparation?: No Do you need assistance with eating?: No Do you have difficulty maintaining continence: No Do you need assistance with getting out of bed/getting out of a chair/moving?: No Do you have difficulty managing or taking your medications?: No  Follow up appointments reviewed: PCP Follow-up appointment confirmed?: No (wife will call and make appt later when she returns home) MD Provider Line Number:[phone] Given: No Specialist Hospital Follow-up appointment confirmed?: NA Do you need transportation to your follow-up appointment?: No Do you understand care options if your condition(s) worsen?: Yes-patient verbalized understanding  SDOH Interventions Today    Flowsheet Row Most Recent Value  SDOH Interventions   Food Insecurity Interventions Intervention Not Indicated  Housing Interventions Intervention Not Indicated  Transportation Interventions Intervention Not Indicated  Utilities Interventions Intervention Not Indicated       Interventions Today    Flowsheet Row Most Recent Value  Chronic Disease   Chronic disease during today's visit Hypertension (HTN), Congestive Heart Failure (CHF), Chronic Obstructive Pulmonary Disease (COPD)  General Interventions   General Interventions Discussed/Reviewed General Interventions Discussed, Durable Medical Equipment (DME), Doctor Visits  Doctor Visits Discussed/Reviewed Doctor Visits Discussed, PCP  Durable Medical Equipment (DME) Other  [scale-reviewed with wife-daily weight monitoring-proper techinque, importance of recording, tracking and taking log to MD appts, alerting provider of abnormal wgt gain and s/s to monitor for of fluid retention]  PCP/Specialist Visits Compliance with follow-up visit  Education Interventions   Education Provided Provided Education  Provided Verbal Education On Nutrition, When to see the doctor, Medication  Nutrition Interventions   Nutrition Discussed/Reviewed Nutrition Discussed, Adding fruits and vegetables, Decreasing salt, Fluid intake, Decreasing fats  Pharmacy Interventions   Pharmacy Dicussed/Reviewed Pharmacy Topics Discussed, Medications and their functions  Safety Interventions   Safety Discussed/Reviewed Safety Discussed        TOC Interventions Today    Flowsheet Row Most Recent Value  TOC Interventions   TOC Interventions Discussed/Reviewed TOC Interventions Discussed     Rama Pilling, RN,BSN,CCM RN Care Manager Transitions of Care  [place]  Direct Phone: [phone] Toll Free: [phone] Fax: [phone]

## 2023-03-31 ENCOUNTER — Telehealth: Payer: Self-pay

## 2023-03-31 ENCOUNTER — Other Ambulatory Visit: Payer: Self-pay | Admitting: Family Medicine

## 2023-03-31 DIAGNOSIS — E785 Hyperlipidemia, unspecified: Secondary | ICD-10-CM

## 2023-03-31 NOTE — Patient Outreach (Addendum)
  Care Coordination   Follow Up Visit Note   03/31/2023 Name: TYRICE HEWITT MRN: [id number] DOB: April 25, 1940  ADONIAS DEMORE is a 83 y.o. year old male who sees Johnny Garnette LABOR, MD for primary care. I  spoke with wife by phone after she called and left voicemail message.   What matters to the patients health and wellness today?  Wife calling to get contact info for DME company that is shipping out pt's walker. Per inpt CM note-heavy duty walker ordered through [employer] and provided with agency contact info. Wife will call to follow up on estimated shipping delivery date. Wife will call back if she has any issues/concerns. No other needs or concerns at this time.        Care Coordination Interventions:  Yes, provided   Follow up plan: No further intervention required.   Encounter Outcome:  Patient Visit Completed    Rama Pilling, RN,BSN,CCM RN Care Manager Transitions of Care  [place]  Direct Phone: [phone] Toll Free: [phone] Fax: [phone]

## 2023-04-01 ENCOUNTER — Inpatient Hospital Stay: Payer: Medicare PPO | Admitting: Family Medicine

## 2023-04-08 ENCOUNTER — Encounter: Payer: Self-pay | Admitting: Family Medicine

## 2023-04-08 ENCOUNTER — Ambulatory Visit: Payer: Medicare PPO | Admitting: Family Medicine

## 2023-04-08 VITALS — BP 102/68 | HR 61 | Temp 97.4°F | Wt 335.0 lb

## 2023-04-08 DIAGNOSIS — J9602 Acute respiratory failure with hypercapnia: Secondary | ICD-10-CM

## 2023-04-08 DIAGNOSIS — J9601 Acute respiratory failure with hypoxia: Secondary | ICD-10-CM

## 2023-04-08 DIAGNOSIS — J40 Bronchitis, not specified as acute or chronic: Secondary | ICD-10-CM

## 2023-04-08 DIAGNOSIS — I10 Essential (primary) hypertension: Secondary | ICD-10-CM

## 2023-04-08 DIAGNOSIS — R600 Localized edema: Secondary | ICD-10-CM | POA: Diagnosis not present

## 2023-04-08 DIAGNOSIS — F418 Other specified anxiety disorders: Secondary | ICD-10-CM

## 2023-04-08 MED ORDER — BUPROPION HCL ER (XL) 150 MG PO TB24: 150.0000 mg | ORAL_TABLET | Freq: Every morning | ORAL | 2 refills | Status: DC

## 2023-04-08 NOTE — Progress Notes (Signed)
   Subjective:    Patient ID: Gabriel Love, male    DOB: 19-Apr-1940, 83 y.o.   MRN: [id number]  HPI Here with his wife for a transitional care visit to follow up on a hospital stay from 03-19-23 to 03-21-23 for a bronchitis that caused an acute exacerbation of his COPD. He was admitted with hypoxia and coughing, and he was initially treated with oxygen and BiPAP. He was given IV Rocephin and Azithromycin. His admission CXR showed no acute infiltrates. His WBC was normal at 10.2. His creatinine was normal at 0.82. his BNP was mildly elevated to 160. Of note, an ECHO a few weeks ago showed an EF of 60-65% with mild LVH. He quickly improved and the BiPAP was removed. He was breathing comfortably on RA at DC. He was sent home on Doxycycline and Prednisone, which he has completed. Since going home he has done well with no SOB or wheezing or coughing. However his wife says his depression has gotten worse over the past year. He worries about things, and he thinks a lot about dying. He is often tearful as well, though his sleep and appetite are unaffected. He has been taking Lexapro since 2015, and it seems like it no longer works for him.    Review of Systems  Constitutional: Negative.   Respiratory: Negative.    Cardiovascular:  Positive for leg swelling. Negative for chest pain and palpitations.  Gastrointestinal: Negative.   Genitourinary: Negative.   Psychiatric/Behavioral:  Positive for dysphoric mood. Negative for agitation, confusion, hallucinations, self-injury, sleep disturbance and suicidal ideas. The patient is nervous/anxious.        Objective:   Physical Exam Constitutional:      Comments: In a wheelchair, breathing comfortably.   Cardiovascular:     Rate and Rhythm: Normal rate and regular rhythm.     Pulses: Normal pulses.     Heart sounds: Normal heart sounds.  Pulmonary:     Effort: Pulmonary effort is normal.     Breath sounds: Normal breath sounds.  Musculoskeletal:      Comments: 2+ edema in both lower legs   Neurological:     Mental Status: He is alert.  Psychiatric:        Behavior: Behavior normal.        Thought Content: Thought content normal.     Comments: He is a little depressed and tries to hide this by joking about everything            Assessment & Plan:  He has recovered well from a bronchitis, likely viral, which caused an exacerbation of his COPD. He is now breathing at his baseline, using Symbicort BID and albuterol as needed. His HTN and leg edema  are stable. His depression has worsened, and he seems to have gotten all the benefit from Lexapro that he can. We agreed to wean off this by taking 1/2 a tablet a day (10 mg) for 2 weeks and then stopping this. We will also start him on Wellbutrin XL 150 mg daily. They will report back in 3-4 weeks. We spent a total of (35   ) minutes reviewing records and discussing these issues.  Gershon Crane, MD

## 2023-04-13 DIAGNOSIS — J9601 Acute respiratory failure with hypoxia: Secondary | ICD-10-CM | POA: Diagnosis not present

## 2023-04-19 ENCOUNTER — Other Ambulatory Visit: Payer: Self-pay | Admitting: Family Medicine

## 2023-05-18 ENCOUNTER — Ambulatory Visit: Payer: Medicare PPO | Admitting: Podiatry

## 2023-05-18 ENCOUNTER — Encounter: Payer: Self-pay | Admitting: Podiatry

## 2023-05-18 DIAGNOSIS — M79675 Pain in left toe(s): Secondary | ICD-10-CM

## 2023-05-18 DIAGNOSIS — M79674 Pain in right toe(s): Secondary | ICD-10-CM | POA: Diagnosis not present

## 2023-05-18 DIAGNOSIS — I739 Peripheral vascular disease, unspecified: Secondary | ICD-10-CM

## 2023-05-18 DIAGNOSIS — B351 Tinea unguium: Secondary | ICD-10-CM | POA: Diagnosis not present

## 2023-05-22 NOTE — Progress Notes (Signed)
  Subjective:  Patient ID: Gabriel Love, male    DOB: 05/18/1940,  MRN: [id number]  Gabriel Love presents to clinic today for at risk foot care. Patient has h/o PAD and painful mycotic toenails x 10 which interfere with daily activities. Pain is relieved with periodic professional debridement. Patient states he was hospitalized since his last visit. He is accompanied by his wife on today's visit. Chief Complaint  Patient presents with   RFC    He is here for a nail trim, PCP is Viviann Spare fry and sees as needed.   New problem(s): None.   PCP is Nelwyn Salisbury, MD.  Allergies  Allergen Reactions   Ivp Dye [Iodinated Contrast Media] Anaphylaxis    Iv dye allergy in 1985, anaphylaxix//a.calhoun- "shocked back to life"   Penicillins Other (See Comments)    Urticaria Has patient had a PCN reaction causing immediate rash, facial/tongue/throat swelling, SOB or lightheadedness with hypotension: no Has patient had a PCN reaction causing severe rash involving mucus membranes or skin necrosis: no Has patient had a PCN reaction that required hospitalization no Has patient had a PCN reaction occurring within the last 10 years: yes If all of the above answers are "NO", then may proceed with Cephalosporin use.    Lisinopril Cough    Review of Systems: Negative except as noted in the HPI.  Objective: No changes noted in today's physical examination. There were no vitals filed for this visit. Gabriel Love is a pleasant 83 y.o. male morbidly obese in NAD. AAO x 3.  Vascular Examination: CFT <3 seconds b/l. DP pulses faintly palpable b/l. PT pulses nonpalpable b/l. Digital hair absent. Skin temperature gradient warm to warm b/l. No pain with calf compression. No ischemia or gangrene. No cyanosis or clubbing noted b/l. Nonpitting edema noted BLE. Evidence of chronic venous insufficiency b/l LE.   Neurological Examination: Sensation grossly intact b/l with 10 gram monofilament. Vibratory sensation  intact b/l.   Dermatological Examination: Pedal skin warm and supple b/l. No open wounds b/l. No interdigital macerations. Toenails 1-5 b/l thick, discolored, elongated with subungual debris and pain on dorsal palpation.  No corns, calluses nor porokeratotic lesions noted.  Musculoskeletal Examination: Dropfoot left foot. Muscle strength 5/5 to all LE muscle groups of right foot. Pes planus b/l. Wearing shoe with attached brace left foot.  Radiographs: None  Assessment/Plan: 1. Pain due to onychomycosis of toenails of both feet   2. Peripheral vascular disease with claudication [facility])     -Patient was evaluated today. All questions/concerns addressed on today's visit. -Continue supportive shoe gear daily. -Mycotic toenails 1-5 bilaterally were debrided in length and girth with sterile nail nippers and dremel without incident. -Patient/POA to call should there be question/concern in the interim.   Return in about 3 months (around 08/15/2023).  [name], DPM      [facility] LOCATION: [address] [place] [address]                   Office [phone]   [facility] LOCATION: [address] [place], [place] [address] Office [phone]

## 2023-06-02 ENCOUNTER — Ambulatory Visit: Payer: Self-pay | Admitting: Family Medicine

## 2023-06-02 NOTE — Telephone Encounter (Signed)
  Chief Complaint: Right Shin Injury Symptoms: Redness,  Frequency: Since Monday  Pertinent Negatives: Patient denies fever, bleeding, debilitating injury, or discoloration.  Disposition: [] ED /[] Urgent Care (no appt availability in office) / [] Appointment(In office/virtual)/ []  [place] [facility]/ [x] Home Care/ [] Refused Recommended Disposition /[] [place] [facility]/ []  Follow-up with PCP Additional Notes: RT is being triaged for a leg injury. The patient's wife reports the injury involved the skin peeling back, the area is nickel sized and is not currently bleeding. Provided home care advice based on the mechanism of injury. Provided education regarding any needs for escalation of care. Patient and wife verbalized understanding, agreed to disposition.    Reason for Disposition  Small cut (scratch) or abrasion (scrape) is also present  Answer Assessment - Initial Assessment Questions 1. MECHANISM: "How did the injury happen?" (e.g., twisting injury, direct blow)        Broom stick  2. ONSET: "When did the injury happen?" (Minutes or hours ago)      Larey Seat and hit his leg on Monday, the broom did.   3. LOCATION: "Where is the injury located?"      Right Shin  4. APPEARANCE of INJURY: "What does the injury look like?"  (e.g., deformity of leg)       5. SEVERITY: "Can you put weight on that leg?" "Can you walk?"      Yes  6. SIZE: For cuts, bruises, or swelling, ask: "How large is it?" (e.g., inches or centimeters)      Nickel sized  7. PAIN: "Is there pain?" If Yes, ask: "How bad is the pain?"   "What does it keep you from doing?" (e.g., Scale 1-10; or mild, moderate, severe)   -  NONE: (0): no pain   -  MILD (1-3): doesn't interfere with normal activities    -  MODERATE (4-7): interferes with normal activities (e.g., work or school) or awakens from sleep, limping    -  SEVERE (8-10): excruciating pain, unable to do any normal activities, unable to walk     None  8.  TETANUS: For any breaks in the skin, ask: "When was the last tetanus booster?"     Unsure  9. OTHER SYMPTOMS: "Do you have any other symptoms?"      Redness  Protocols used: Leg Injury-A-AH

## 2023-06-02 NOTE — Telephone Encounter (Signed)
 1st attempt to contact patient. Did call mobile phone listed in chart as requested. Unable to leave message as voicemail is full.   Copied From CRM [id number]. Reason for Triage: Patients wife requesting a call back from nurse as patient has a minor scrap on his leg but would like advise on what cream to buy over the counter. - Please call mobile phone listed in chart.

## 2023-07-04 ENCOUNTER — Other Ambulatory Visit: Payer: Self-pay | Admitting: Family Medicine

## 2023-07-04 DIAGNOSIS — E785 Hyperlipidemia, unspecified: Secondary | ICD-10-CM

## 2023-07-15 ENCOUNTER — Ambulatory Visit: Admitting: Family Medicine

## 2023-07-15 ENCOUNTER — Encounter: Payer: Self-pay | Admitting: Family Medicine

## 2023-07-15 VITALS — BP 104/68 | HR 54 | Temp 97.6°F | Wt 335.0 lb

## 2023-07-15 DIAGNOSIS — R600 Localized edema: Secondary | ICD-10-CM

## 2023-07-15 DIAGNOSIS — M65332 Trigger finger, left middle finger: Secondary | ICD-10-CM | POA: Diagnosis not present

## 2023-07-15 DIAGNOSIS — I10 Essential (primary) hypertension: Secondary | ICD-10-CM | POA: Diagnosis not present

## 2023-07-15 DIAGNOSIS — F418 Other specified anxiety disorders: Secondary | ICD-10-CM

## 2023-07-15 MED ORDER — METOPROLOL SUCCINATE ER 50 MG PO TB24: ORAL_TABLET | ORAL | 3 refills | Status: AC

## 2023-07-15 MED ORDER — FUROSEMIDE 40 MG PO TABS: 40.0000 mg | ORAL_TABLET | Freq: Every day | ORAL | 3 refills | Status: AC

## 2023-07-15 MED ORDER — VENLAFAXINE HCL ER 150 MG PO CP24: 150.0000 mg | ORAL_CAPSULE | Freq: Every day | ORAL | 2 refills | Status: DC

## 2023-07-15 NOTE — Progress Notes (Signed)
   Subjective:    Patient ID: Gabriel Love, male    DOB: 13-Dec-1940, 83 y.o.   MRN: [id number]  HPI Here with his wife for several issues. First he needs refills on Lasix  and Metoprolol . His BP has been stable. He also says that 4 weeks ago he developed stiffness and pain in the left hand as well as trouble extending the left 3rd finger. No hx of trauma. In addition they want to discuss his depression. When we saw him in January we agreed to stop Lexapro  and try Wellbutrin  XL. He had to stop this after a few weeks however because it made him nervous and shaky. He then went back on the Lexapro . His wife says he is still depressed at times and he often has crying spells.    Review of Systems  Constitutional: Negative.   Respiratory: Negative.    Cardiovascular:  Positive for leg swelling. Negative for chest pain and palpitations.  Musculoskeletal:  Positive for arthralgias.  Psychiatric/Behavioral:  Positive for dysphoric mood. Negative for agitation, behavioral problems, confusion, decreased concentration, hallucinations, self-injury, sleep disturbance and suicidal ideas. The patient is nervous/anxious.        Objective:   Physical Exam Constitutional:      Comments: In a wheelchair   Cardiovascular:     Rate and Rhythm: Normal rate and regular rhythm.     Pulses: Normal pulses.     Heart sounds: Normal heart sounds.  Pulmonary:     Effort: Pulmonary effort is normal.     Breath sounds: Normal breath sounds.  Musculoskeletal:     Comments: 4+ edema in both legs. He is tender over the 3rd MCP joint of the left hand. He has full flexion of the left 3rd finger, but he cannot fully extend it. No swelling   Neurological:     Mental Status: He is alert.  Psychiatric:        Mood and Affect: Mood normal.        Behavior: Behavior normal.        Thought Content: Thought content normal.           Assessment & Plan:  His HTN and leg edema are stable, so we refilled the Lasix  and  Metoprolol . He has a trigger finger in the left hand, so we will refer him to Hand Surgery to evaluate. For his depression and anxiety, we will stop the Lexapro  and try Effexor  XR 150 mg daily. He will report back in 3-4 weeks.  Corita Diego, MD

## 2023-08-04 ENCOUNTER — Telehealth: Payer: Self-pay | Admitting: *Deleted

## 2023-08-04 NOTE — Telephone Encounter (Signed)
 Copied from CRM [id number]. Topic: General - Other >> Aug 04, 2023 10:39 AM [place] A wrote: Reason for CRM: Patient's wife called to inform Dr.Fry that Effexor  XL 150 MG  is helping patient very well.

## 2023-08-04 NOTE — Telephone Encounter (Signed)
 FYI

## 2023-08-11 DIAGNOSIS — M65322 Trigger finger, left index finger: Secondary | ICD-10-CM | POA: Diagnosis not present

## 2023-08-11 DIAGNOSIS — M65332 Trigger finger, left middle finger: Secondary | ICD-10-CM | POA: Diagnosis not present

## 2023-08-11 DIAGNOSIS — M65312 Trigger thumb, left thumb: Secondary | ICD-10-CM | POA: Diagnosis not present

## 2023-08-22 ENCOUNTER — Ambulatory Visit: Payer: Medicare PPO | Admitting: Podiatry

## 2023-08-22 DIAGNOSIS — M79674 Pain in right toe(s): Secondary | ICD-10-CM | POA: Diagnosis not present

## 2023-08-22 DIAGNOSIS — M79675 Pain in left toe(s): Secondary | ICD-10-CM | POA: Diagnosis not present

## 2023-08-22 DIAGNOSIS — B351 Tinea unguium: Secondary | ICD-10-CM | POA: Diagnosis not present

## 2023-08-22 DIAGNOSIS — I739 Peripheral vascular disease, unspecified: Secondary | ICD-10-CM

## 2023-08-22 NOTE — Progress Notes (Signed)
 Subjective:  Patient ID: Gabriel Love, male    DOB: 07-10-1940,  MRN: [id number]  Murvin Arthurs presents to clinic today for:  Chief Complaint  Patient presents with   Nail Problem    Nail trim    Patient notes nails are thick and elongated, causing pain in shoe gear when ambulating.  His wife is with him today.  States that he is almost 5 years now for his shoe with an attached brace.  He wears this on the left.  States that he is walking on the inside of his foot and the brace has never fit very well.  They plan to see Dr. Julio Ohm soon to see if he can appeal the typical 5-year waiting period to see if he can get a pair of new shoes with attached brace sooner since this is not controlling the deformity and supporting the area very well and is actually making it worse.  His current brace is fairly ineffective and not serving its intended purpose.  PCP is Donley Furth, MD. last seen 07/15/2023  Past Medical History:  Diagnosis Date   Abdominal hernia    Allergy    Blood transfusion without reported diagnosis 12/2006   At [facility] with hip replacement - 2 units transfused   Cancer (HCC)    skin cancers   Cataract    Depression    DJD (degenerative joint disease)    shoulders bilateral   Environmental allergies    Full dentures    Grade I diastolic dysfunction 05/08/2020   Hearing loss    bilateral hearing aids   History of colonic polyps    History of urinary tract infection    Hyperlipidemia    under control   Hypertension    "borderline"   Nonspecific abnormal finding in stool contents    Other general symptoms(780.99)    Prostate hypertrophy    sees Dr. Isla Mari    Urinary leakage    Allergies  Allergen Reactions   Ivp Dye [Iodinated Contrast Media] Anaphylaxis    Iv dye allergy in 1985, anaphylaxix//a.calhoun- "shocked back to life"   Penicillins Other (See Comments)    Urticaria Has patient had a PCN reaction causing immediate rash, facial/tongue/throat swelling,  SOB or lightheadedness with hypotension: no Has patient had a PCN reaction causing severe rash involving mucus membranes or skin necrosis: no Has patient had a PCN reaction that required hospitalization no Has patient had a PCN reaction occurring within the last 10 years: yes If all of the above answers are "NO", then may proceed with Cephalosporin use.    Lisinopril  Cough    Objective:  BRYER COZZOLINO is a pleasant 83 y.o. male in NAD. AAO x 3.  Vascular Examination: Patient has palpable DP pulse, absent PT pulse bilateral.  Delayed capillary refill bilateral toes.  Sparse digital hair bilateral.  Proximal to distal cooling WNL bilateral.    Dermatological Examination: Interspaces are clear with no open lesions noted bilateral.  Skin is shiny and atrophic bilateral.  Nails are 3-8mm thick, with yellowish/brown discoloration, subungual debris and distal onycholysis x10.  There is pain with compression of nails x10.   Patient qualifies for at-risk foot care because of PVD.  Assessment/Plan: 1. Pain due to onychomycosis of toenails of both feet   2. PVD (peripheral vascular disease) (HCC)    Mycotic nails x10 were sharply debrided with sterile nail nippers and power debriding burr to decrease bulk and length.  Once again, I  encouraged patient to speak to Dr. Julio Ohm to to see if his office will be willing to file an appeal/predetermination for a new brace with his shoes since his current one is causing more pain and discomfort and is not helping control the deformity at the ankle and foot.  He will call his office to schedule an appointment in the near future  Follow-up in 3 months   Mellina Benison D. Ryota Treece, DPM, FACFAS [facility]     [address] [place], [place] [address]                Office [phone]  Fax [phone]

## 2023-08-29 ENCOUNTER — Ambulatory Visit: Payer: Self-pay | Admitting: *Deleted

## 2023-08-29 NOTE — Telephone Encounter (Signed)
 FYI Only or Action Required?: FYI only for provider  Patient was last seen in primary care on 07/15/2023 by Donley Furth, MD. Called Nurse Triage reporting Tick Removal. Symptoms began several days ago. Interventions attempted: OTC medications: neosporin. Symptoms are: unchanged.  Triage Disposition: Home Care  Patient/caregiver understands and will follow disposition?: yes                Copied from CRM [id number]. Topic: Clinical - Red Word Triage >> Aug 29, 2023  2:56 PM Deaijah H wrote: Red Word that prompted transfer to Nurse Triage: Bit by tick Reason for Disposition  Unknown type of tick bite with no complications  Answer Assessment - Initial Assessment Questions 1. ATTACHED:  "Is the tick still on the skin?"  (e.g., yes, no, unsure)     No  2. ONSET - TICK STILL ATTACHED:  "How long do you think the tick has been on your skin?" (e.g., hours, days, unsure)  Note:  Is there a recent activity (camping, hiking) where the caller may have been exposed?     Not sure  3. ONSET - TICK NOT STILL ATTACHED: "If the tick has been removed, how long do you think the tick was attached before you removed it?" (e.g., 5 hours, 2 days). "When was this?"     Removed Saturday  4. LOCATION: "Where is the tick bite located?" (e.g., arm, leg)     Left upper area of hip where leg and hip join  5. TYPE of TICK: "Is it a wood tick or a deer tick?" (e.g., deer tick, wood tick; unsure)     Regular size tick  6. SIZE of TICK: "How big is the tick?" (e.g., size of poppy seed, apple seed, watermelon seed; unsure) Note: Deer ticks can be the size of a poppy seed (nymph) or an apple seed (adult).       Watermelon seed  7. ENGORGED: "Did the tick look flat or engorged (full, swollen)?" (e.g., flat, engorged; unsure)     Flat  8. OTHER SYMPTOMS: "Do you have any other symptoms?" (e.g., fever, rash, redness at bite area, red ring around bite)    Small amount pink color to skin ,  neosporin applied to hip  and looks better  9. PREGNANCY: "Is there any chance you are pregnant?" "When was your last menstrual period?"     Na  Patient wife will monitor for sx and if site looks worse or sx arise she will call back for appt. Please advise if PCP would like to see patient.  Protocols used: Tick Bite-A-AH

## 2023-08-30 NOTE — Telephone Encounter (Signed)
 Spoke with patient's wife Gabriel Love per DPR, patient will call back to sch OV

## 2023-08-30 NOTE — Telephone Encounter (Signed)
 Make an OV for us  to see him

## 2023-08-30 NOTE — Telephone Encounter (Signed)
 Attempted to call pt, no option to leave a message, will call back again

## 2023-08-31 ENCOUNTER — Encounter: Payer: Self-pay | Admitting: Family Medicine

## 2023-08-31 ENCOUNTER — Ambulatory Visit: Admitting: Family Medicine

## 2023-08-31 VITALS — BP 110/70 | HR 60 | Temp 98.1°F | Wt 335.0 lb

## 2023-08-31 DIAGNOSIS — E785 Hyperlipidemia, unspecified: Secondary | ICD-10-CM | POA: Diagnosis not present

## 2023-08-31 DIAGNOSIS — F418 Other specified anxiety disorders: Secondary | ICD-10-CM

## 2023-08-31 DIAGNOSIS — W57XXXA Bitten or stung by nonvenomous insect and other nonvenomous arthropods, initial encounter: Secondary | ICD-10-CM | POA: Diagnosis not present

## 2023-08-31 DIAGNOSIS — S30860A Insect bite (nonvenomous) of lower back and pelvis, initial encounter: Secondary | ICD-10-CM

## 2023-08-31 DIAGNOSIS — H6122 Impacted cerumen, left ear: Secondary | ICD-10-CM | POA: Diagnosis not present

## 2023-08-31 MED ORDER — ATORVASTATIN CALCIUM 40 MG PO TABS
40.0000 mg | ORAL_TABLET | Freq: Every day | ORAL | 3 refills | Status: AC
Start: 2023-08-31 — End: ?

## 2023-08-31 MED ORDER — VENLAFAXINE HCL ER 150 MG PO CP24: 150.0000 mg | ORAL_CAPSULE | Freq: Every day | ORAL | 3 refills | Status: DC

## 2023-08-31 MED ORDER — CLOTRIMAZOLE-BETAMETHASONE 1-0.05 % EX CREA: 1.0000 | TOPICAL_CREAM | Freq: Two times a day (BID) | CUTANEOUS | 5 refills | Status: AC

## 2023-08-31 MED ORDER — DOXYCYCLINE HYCLATE 100 MG PO TABS: 100.0000 mg | ORAL_TABLET | Freq: Two times a day (BID) | ORAL | 0 refills | Status: DC

## 2023-08-31 MED ORDER — POTASSIUM CHLORIDE CRYS ER 20 MEQ PO TBCR: 20.0000 meq | EXTENDED_RELEASE_TABLET | Freq: Every day | ORAL | 3 refills | Status: AC

## 2023-08-31 NOTE — Progress Notes (Signed)
   Subjective:    Patient ID: Gabriel Love, male    DOB: 1940/12/21, 83 y.o.   MRN: [id number]  HPI Here for several issues. First his wife pulled out a tick from his left buttock 4 days ago, and the site has become red and irritated. He denies any headache or joint pains. Second he has had trouble hearing with his hearing aids for a few weeks, so he thinks wax has built up again. Third we have been treating him for depression and anxiety, and we switched him from Lexapro  to Effexor  XR a few weeks ago. This is working much better for him, and his moods have improved tremendously.    Review of Systems  Constitutional: Negative.   HENT:  Positive for hearing loss. Negative for ear pain.   Respiratory: Negative.    Cardiovascular: Negative.   Skin:  Positive for color change.  Psychiatric/Behavioral:  Negative for agitation, behavioral problems, confusion, decreased concentration, dysphoric mood, hallucinations and sleep disturbance. The patient is not nervous/anxious.        Objective:   Physical Exam Constitutional:      Appearance: He is not ill-appearing.  HENT:     Right Ear: Tympanic membrane, ear canal and external ear normal.     Left Ear: Tympanic membrane normal. There is impacted cerumen.  Cardiovascular:     Rate and Rhythm: Normal rate and regular rhythm.     Pulses: Normal pulses.     Heart sounds: Normal heart sounds.  Pulmonary:     Effort: Pulmonary effort is normal.     Breath sounds: Normal breath sounds.  Skin:    Comments: There is a small bite mark on the the left buttock that is surrounded by a zone of erythema   Neurological:     Mental Status: He is alert.  Psychiatric:        Mood and Affect: Mood normal.        Behavior: Behavior normal.        Thought Content: Thought content normal.           Assessment & Plan:  He recently had a tick bite so we will treat him with 10 days of Doxycycline . His left ear canal was blocked by cerumen, so after  informed consent was obtained, I was able to remove this easily with a speculum. He tolerated the procedure well. Third, his depression and anxiety are now well controlled with Effexor  XR. Corita Diego, MD

## 2023-09-12 ENCOUNTER — Encounter: Payer: Self-pay | Admitting: Orthopedic Surgery

## 2023-09-12 ENCOUNTER — Ambulatory Visit: Admitting: Orthopedic Surgery

## 2023-09-12 DIAGNOSIS — S93432A Sprain of tibiofibular ligament of left ankle, initial encounter: Secondary | ICD-10-CM

## 2023-09-12 NOTE — Progress Notes (Unsigned)
 Office Visit Note   Patient: Gabriel Love           Date of Birth: 03/30/1940           MRN: [id number] Visit Date: 09/12/2023              Requested by: Johnny Garnette LABOR, MD [address] [place],  [place] [address] PCP: Johnny Garnette LABOR, MD  Chief Complaint  Patient presents with   Left Foot - Pain      HPI: 83 y/o male with history of left ankle fibular fracture with syndesmosis disruption. Patient was not a surgical candidate was immobilized.  Then he was placed long term in a double upright brace with extra depth and custom orthotic.  The shoe has significant wear and it is causing pressure on the toes 2-5.  No open wounds.    Stable valgus collapse of the tibial talar joint  Assessment & Plan: Visit Diagnoses:  1. Syndesmotic disruption of ankle, left, initial encounter     Plan: He was given a prescription for [employer] to have a new double upright brace, extra depth shoe and custom orthotics.  He will f/u PRN  Follow-Up Instructions: No follow-ups on file.   Ortho Exam  Patient is alert, oriented, no adenopathy, well-dressed, normal affect, normal respiratory effort. Left foot with palpable DP pulse.  No open wounds or ischemic changes.  Pressure markings on dorsum of toes 2-5.  No edema or cellulitis.      Imaging: No results found. No images are attached to the encounter.  Labs: Lab Results  Component Value Date   HGBA1C 5.3 01/21/2020   HGBA1C 5.5 02/19/2019   HGBA1C 5.6 02/28/2015   ESRSEDRATE 14 06/06/2013   REPTSTATUS 05/13/2020 FINAL 05/08/2020   CULT  05/08/2020    NO GROWTH 5 DAYS Performed at [facility], [address], [place], [place] [address]    LABORGA PSEUDOMONAS AERUGINOSA (A) 05/08/2020     Lab Results  Component Value Date   ALBUMIN 3.3 (L) 03/20/2023   ALBUMIN 3.5 03/19/2023   ALBUMIN 3.4 (L) 01/01/2022    Lab Results  Component Value Date   MG 2.4 03/20/2023   MG 2.0 01/04/2022   MG 1.7 05/08/2020   Lab Results   Component Value Date   VD25OH 44.91 05/09/2020    No results found for: PREALBUMIN    Latest Ref Rng & Units 03/20/2023    3:41 AM 03/19/2023    9:45 PM 01/28/2023    1:57 PM  CBC EXTENDED  WBC 4.0 - 10.5 K/uL 8.6  10.2  9.1   RBC 4.22 - 5.81 MIL/uL 5.16  5.21  5.03   Hemoglobin 13.0 - 17.0 g/dL 84.7  84.1  85.1   HCT 39.0 - 52.0 % 48.3  49.0  47.1   Platelets 150 - 400 K/uL 166  185  185   NEUT# 1.7 - 7.7 K/uL   6.5   Lymph# 0.7 - 4.0 K/uL   1.5      There is no height or weight on file to calculate BMI.  Orders:  No orders of the defined types were placed in this encounter.  No orders of the defined types were placed in this encounter.    Procedures: No procedures performed  Clinical Data: No additional findings.  ROS:  All other systems negative, except as noted in the HPI. Review of Systems  Objective: Vital Signs: There were no vitals taken for this visit.  Specialty Comments:  No specialty comments available.  PMFS History: Patient Active Problem List   Diagnosis Date Noted   Acute bronchitis 03/20/2023   Acute respiratory failure with hypoxia and hypercapnia (HCC) 03/20/2023   Elevated brain natriuretic peptide (BNP) level 03/20/2023   Acute respiratory failure with hypoxia (HCC) 03/20/2023   Pain due to onychomycosis of toenails of both feet 04/27/2022   Multifocal pneumonia 01/02/2022   Asthma exacerbation 01/02/2022   Acute gastroenteritis 05/09/2020   Sepsis secondary to UTI (HCC) 05/08/2020   Grade I diastolic dysfunction 05/08/2020   Class 1 obesity 05/08/2020   Hyponatremia 05/08/2020   Hypokalemia 05/08/2020   Hypoalbuminemia 05/08/2020   Mild protein malnutrition (HCC) 05/08/2020   Depression with anxiety 07/24/2019   Obesity, Class III, BMI 40-49.9 (morbid obesity) 04/04/2018   Bilateral leg edema 02/07/2017   Osteoarthritis 01/28/2017   Urge and stress incontinence 01/28/2017   History of colonic polyps    Colon polyp  08/07/2013   Benign neoplasm of ileocecal valve, 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   Nonspecific abnormal finding in stool contents 05/29/2013   Atherosclerotic peripheral vascular disease with intermittent claudication (HCC) 06/14/2012   Spinal stenosis of lumbar region 06/14/2012   DEGENERATIVE DISC DISEASE, 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   Mixed hyperlipidemia 11/23/2006   Essential hypertension 11/23/2006   HEMATURIA 11/23/2006   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   Past Medical History:  Diagnosis Date   Abdominal hernia    Allergy    Blood transfusion without reported diagnosis 12/2006   At [facility] with hip replacement - 2 units transfused   Cancer (HCC)    skin cancers   Cataract    Depression    DJD (degenerative joint disease)    shoulders bilateral   Environmental allergies    Full dentures    Grade I diastolic dysfunction 05/08/2020   Hearing loss    bilateral hearing aids   History of colonic polyps    History of urinary tract infection    Hyperlipidemia    under control   Hypertension    borderline   Nonspecific abnormal finding in stool contents    Other general symptoms(780.99)    Prostate hypertrophy    sees Dr. Chales    Urinary leakage     Family History  Problem Relation Age of Onset   Hypertension Mother    Diabetes Mother    Heart failure Mother        cad/mi with rupture ventricle   Hyperlipidemia Father    Colon polyps Father    Cancer Neg Hx        colon or prostate   Colon cancer Neg Hx    Esophageal cancer Neg Hx    Rectal cancer Neg Hx    Stomach cancer Neg Hx     Past Surgical History:  Procedure Laterality Date   CATARACT EXTRACTION W/PHACO Right 03/25/2014   Procedure: CATARACT EXTRACTION PHACO AND INTRAOCULAR LENS PLACEMENT; CDE:9.76;  Surgeon: Dow JULIANNA Burke, MD;  Location: [facility];  Service: Ophthalmology;  Laterality: Right;   CATARACT EXTRACTION W/PHACO Left 06/24/2014   Procedure: CATARACT EXTRACTION PHACO AND INTRAOCULAR LENS  PLACEMENT (IOC);  Surgeon: Dow JULIANNA Burke, MD;  Location: [facility];  Service: Ophthalmology;  Laterality: Left;  CDE:8.45   CIRCUMCISION N/A 03/26/2016   Procedure: CIRCUMCISION ADULT;  Surgeon: Arlena Chales, MD;  Location: [facility];  Service: Urology;  Laterality: N/AMERL [place], NON-NEWBORN  1985   COLONOSCOPY  06-19-13, 03/05/15   per Dr. Debrah, mass  at the ileocecal valve plus several polyps, pathology benign, repeat in one year, polyp 2016    COLONOSCOPY WITH PROPOFOL  N/A 07/29/2015   Procedure: COLONOSCOPY WITH PROPOFOL ;  Surgeon: Elspeth Deward Naval, MD;  Location: [facility];  Service: Gastroenterology;  Laterality: N/A;   JOINT REPLACEMENT  12/2006   total left hip per Dr. Beverley    LAPAROSCOPIC PARTIAL COLECTOMY N/A 08/07/2013   Procedure: LAPAROSCOPIC right PARTIAL COLECTOMY;  Surgeon: Bernarda Ned, MD;  Location: [facility];  Service: General;  Laterality: N/A;   MULTIPLE TOOTH EXTRACTIONS     PROSTATE SURGERY  03-29-11   had CTT per Dr. Chales, in office   Social History   Occupational History   Occupation: Retired  Tobacco Use   Smoking status: Former    Current packs/day: 0.00    Average packs/day: 1 pack/day for 5.0 years (5.0 ttl pk-yrs)    Types: Cigarettes    Start date: 03/23/1963    Quit date: 03/22/1968    Years since quitting: 55.5   Smokeless tobacco: Current    Types: Chew    Last attempt to quit: 09/20/2015   Tobacco comments:    occassionally  Vaping Use   Vaping status: Never Used  Substance and Sexual Activity   Alcohol use: No    Alcohol/week: 0.0 standard drinks of alcohol   Drug use: No   Sexual activity: Not on file

## 2023-11-16 ENCOUNTER — Ambulatory Visit: Payer: Medicare PPO | Admitting: Podiatry

## 2023-11-16 ENCOUNTER — Encounter: Payer: Self-pay | Admitting: Podiatry

## 2023-11-16 DIAGNOSIS — I739 Peripheral vascular disease, unspecified: Secondary | ICD-10-CM | POA: Diagnosis not present

## 2023-11-16 DIAGNOSIS — M79675 Pain in left toe(s): Secondary | ICD-10-CM | POA: Diagnosis not present

## 2023-11-16 DIAGNOSIS — S91302A Unspecified open wound, left foot, initial encounter: Secondary | ICD-10-CM | POA: Diagnosis not present

## 2023-11-16 DIAGNOSIS — M79674 Pain in right toe(s): Secondary | ICD-10-CM

## 2023-11-16 DIAGNOSIS — B351 Tinea unguium: Secondary | ICD-10-CM

## 2023-11-16 MED ORDER — DOXYCYCLINE HYCLATE 100 MG PO CAPS
100.0000 mg | ORAL_CAPSULE | Freq: Two times a day (BID) | ORAL | 0 refills | Status: AC
Start: 2023-11-16 — End: 2023-11-26

## 2023-11-16 NOTE — Progress Notes (Signed)
 Subjective:  Patient ID: Gabriel Love, male    DOB: [birth year],  MRN: [id number]  Gabriel Love presents to clinic today for at risk foot care. Pt has h/o NIDDM with PAD and painful thick toenails that are difficult to trim. Pain interferes with ambulation. Aggravating factors include wearing enclosed shoe gear. Pain is relieved with periodic professional debridement.  Chief Complaint  Patient presents with   RFC     RFC Non diabetic toenail trim. Pre- ulcer starting left great toe.6 pain. Bandage to protect.   New problem(s): Patient is accompanied by his wife on today's visit. States he purchased a pair of new shoes as well as brace attachment and the new shoes rubbed two spots on his left foot. Patient discontinued wearing the shoes and started wearing his older pair until he can get back to the pedorthist. Wife has been keeping it clean and covered with a band-aid. He denies any fever, chills, night sweats, nausea or vomiting.       PCP is Johnny Garnette LABOR, MD.  Allergies  Allergen Reactions   Ivp Dye [Iodinated Contrast Media] Anaphylaxis    Iv dye allergy in 1985, anaphylaxix//a.calhoun- shocked back to life   Penicillins Other (See Comments)    Urticaria Has patient had a PCN reaction causing immediate rash, facial/tongue/throat swelling, SOB or lightheadedness with hypotension: no Has patient had a PCN reaction causing severe rash involving mucus membranes or skin necrosis: no Has patient had a PCN reaction that required hospitalization no Has patient had a PCN reaction occurring within the last 10 years: yes If all of the above answers are NO, then may proceed with Cephalosporin use.    Lisinopril  Cough    Review of Systems: Negative except as noted in the HPI.  Objective:  There were no vitals filed for this visit. Gabriel Love is a pleasant 83 y.o. male morbidly obese in NAD. AAO x 3.  Vascular Examination: CFT <3 seconds b/l. DP pulses faintly palpable b/l.  PT pulses nonpalpable b/l. Digital hair absent. Skin temperature gradient warm to warm b/l. No pain with calf compression. No ischemia or gangrene. No cyanosis or clubbing noted b/l. Nonpitting edema noted BLE. Evidence of chronic venous insufficiency b/l LE.   Neurological Examination: Sensation grossly intact b/l with 10 gram monofilament. Vibratory sensation intact b/l.   Dermatological Examination: Closed friction blister noted medial aspect left great toe. No drainage, no odor. Small pressure area noted dorsal PIPJ left 5th digit as well as left 5th metatarsal head.  Pedal skin warm and supple b/l. No interdigital macerations. Toenails 1-5 b/l thick, discolored, elongated with subungual debris and pain on dorsal palpation.  No corns, calluses nor porokeratotic lesions noted.  Musculoskeletal Examination: Dropfoot left foot. Muscle strength 5/5 to all LE muscle groups of right foot. Pes planus b/l. Wearing shoe with attached brace left foot.  Radiographs: None  Assessment/Plan: 1. Pain due to onychomycosis of toenails of both feet   2. Wound of left foot   3. PVD (peripheral vascular disease) (HCC)     Meds ordered this encounter  Medications   doxycycline  (VIBRAMYCIN ) 100 MG capsule    Sig: Take 1 capsule (100 mg total) by mouth 2 (two) times daily for 10 days.    Dispense:  20 capsule    Refill:  0  Patient was evaluated and treated. All patient's and/or POA's questions/concerns addressed on today's visit. Toenails 1-5 debrided in length and girth without incident. Continue soft, supportive shoe gear  daily. Report any pedal injuries to medical professional. Call office if there are any questions/concerns. -Continue to keep areas of left foot clean. Dispensed tube foam for left great toe and wife may continue 2-inch fabric band-aid to 5th toe/metatarsal area although I recommended fabric band-aid. She related understanding. -Rx for Doxycyline 100 mg, #20, to be taken one capsule  twice daily for 10 days. -Patient scheduled to see Dr. Juliene Medicine in 2 weeks for follow up of left foot wounds. -Patient/POA to call should there be question/concern in the interim.   Return in about 3 months (around 02/16/2024).  Gabriel Love, DPM      [place] LOCATION: [address] [place] [address]                   Office [phone]   [facility] LOCATION: [address] [place], [place] [address] Office [phone]

## 2023-11-30 ENCOUNTER — Ambulatory Visit: Admitting: Podiatry

## 2023-12-06 ENCOUNTER — Ambulatory Visit: Admitting: Podiatry

## 2023-12-06 VITALS — Ht 72.0 in | Wt 335.0 lb

## 2023-12-06 DIAGNOSIS — L97501 Non-pressure chronic ulcer of other part of unspecified foot limited to breakdown of skin: Secondary | ICD-10-CM

## 2023-12-06 DIAGNOSIS — I739 Peripheral vascular disease, unspecified: Secondary | ICD-10-CM | POA: Diagnosis not present

## 2023-12-06 NOTE — Patient Instructions (Signed)
 Call to schedule your vascular testing:   [facility] Jeralene Mom. [facility] [address] [place],  [place]  [address]   Main: [phone]

## 2023-12-07 NOTE — Progress Notes (Signed)
  Subjective:  Patient ID: Gabriel Love, male    DOB: September 14, 1940,  MRN: [id number]  Chief Complaint  Patient presents with   Nail Problem    RM 4  Left great toe and the right and left index toes has a small blister, blister has been present for the past two weeks.    83 y.o. male presents with the above complaint. History confirmed with patient.   Objective:  Physical Exam: Pulses are nonpalpable he has moderate edema and venous insufficiency he has a healing scab on the bilateral second toe and left great toe without signs of active infection   Assessment:   1. PVD (peripheral vascular disease) (HCC)   2. Chronic ulcer of great toe, limited to breakdown of skin [facility])      Plan:  Patient was evaluated and treated and all questions answered.  He has completed his antibiotics and nothing further oral antibiotics are necessary at this point and the lesions are healing nicely.  I did recommend noninvasive vascular testing and this has been ordered and referred.  If any abnormalities are noted we will plan to send to vascular surgery.  Follow with me as needed.  Return if symptoms worsen or fail to improve.

## 2023-12-14 ENCOUNTER — Ambulatory Visit (HOSPITAL_COMMUNITY): Admission: RE | Admit: 2023-12-14 | Source: Ambulatory Visit

## 2024-01-06 ENCOUNTER — Other Ambulatory Visit: Payer: Self-pay | Admitting: Family Medicine

## 2024-01-12 ENCOUNTER — Encounter (HOSPITAL_COMMUNITY): Payer: Self-pay

## 2024-01-17 ENCOUNTER — Other Ambulatory Visit: Payer: Self-pay | Admitting: Family Medicine

## 2024-01-17 MED ORDER — VENLAFAXINE HCL ER 150 MG PO CP24: 150.0000 mg | ORAL_CAPSULE | Freq: Every day | ORAL | 3 refills | Status: AC

## 2024-01-17 NOTE — Telephone Encounter (Signed)
 Copied from CRM [id number]. Topic: Clinical - Medication Refill >> Jan 17, 2024  8:15 AM Alexandria E wrote: Medication: venlafaxine  XR (EFFEXOR  XR) 150 MG 24 hr capsule  Has the patient contacted their pharmacy? Yes (Agent: If no, request that the patient contact the pharmacy for the refill. If patient does not wish to contact the pharmacy document the reason why and proceed with request.) (Agent: If yes, when and what did the pharmacy advise?)  This is the patient's preferred pharmacy:  [facility] [phone] - [place], [place] - [address] AT [facility] [address] [place] [address] Phone: [phone] Fax: [phone]  Is this the correct pharmacy for this prescription? Yes If no, delete pharmacy and type the correct one.   Has the prescription been filled recently? Yes  Is the patient out of the medication? No, 5 pills left.  Has the patient been seen for an appointment in the last year OR does the patient have an upcoming appointment? Yes  Can we respond through MyChart? No  Agent: Please be advised that Rx refills may take up to 3 business days. We ask that you follow-up with your pharmacy.

## 2024-01-30 DIAGNOSIS — M25572 Pain in left ankle and joints of left foot: Secondary | ICD-10-CM | POA: Diagnosis not present

## 2024-01-30 DIAGNOSIS — I87302 Chronic venous hypertension (idiopathic) without complications of left lower extremity: Secondary | ICD-10-CM | POA: Diagnosis not present

## 2024-01-30 DIAGNOSIS — S92325A Nondisplaced fracture of second metatarsal bone, left foot, initial encounter for closed fracture: Secondary | ICD-10-CM | POA: Diagnosis not present

## 2024-02-06 ENCOUNTER — Other Ambulatory Visit: Payer: Self-pay

## 2024-02-06 ENCOUNTER — Encounter (HOSPITAL_BASED_OUTPATIENT_CLINIC_OR_DEPARTMENT_OTHER): Payer: Self-pay | Admitting: Emergency Medicine

## 2024-02-06 ENCOUNTER — Emergency Department (HOSPITAL_BASED_OUTPATIENT_CLINIC_OR_DEPARTMENT_OTHER)
Admission: EM | Admit: 2024-02-06 | Discharge: 2024-02-06 | Disposition: A | Discharge status: Home / Self Care | Attending: Emergency Medicine | Admitting: Emergency Medicine

## 2024-02-06 ENCOUNTER — Emergency Department (HOSPITAL_BASED_OUTPATIENT_CLINIC_OR_DEPARTMENT_OTHER)

## 2024-02-06 DIAGNOSIS — Z23 Encounter for immunization: Secondary | ICD-10-CM | POA: Insufficient documentation

## 2024-02-06 DIAGNOSIS — R600 Localized edema: Secondary | ICD-10-CM | POA: Diagnosis not present

## 2024-02-06 DIAGNOSIS — Z7982 Long term (current) use of aspirin: Secondary | ICD-10-CM | POA: Diagnosis not present

## 2024-02-06 DIAGNOSIS — M79605 Pain in left leg: Secondary | ICD-10-CM | POA: Diagnosis not present

## 2024-02-06 DIAGNOSIS — L03116 Cellulitis of left lower limb: Secondary | ICD-10-CM | POA: Diagnosis not present

## 2024-02-06 DIAGNOSIS — Z79899 Other long term (current) drug therapy: Secondary | ICD-10-CM | POA: Diagnosis not present

## 2024-02-06 DIAGNOSIS — M7989 Other specified soft tissue disorders: Secondary | ICD-10-CM | POA: Diagnosis not present

## 2024-02-06 LAB — BASIC METABOLIC PANEL WITH GFR
Anion gap: 6 (ref 5–15)
BUN: 16 mg/dL (ref 8–23)
CO2: 32 mmol/L (ref 22–32)
Calcium: 9 mg/dL (ref 8.9–10.3)
Chloride: 104 mmol/L (ref 98–111)
Creatinine, Ser: 0.82 mg/dL (ref 0.61–1.24)
GFR, Estimated: >60 mL/min (ref >60)
Glucose, Bld: 96 mg/dL (ref 70–99)
Potassium: 4.5 mmol/L (ref 3.5–5.1)
Sodium: 143 mmol/L (ref 135–145)

## 2024-02-06 LAB — CBC
HCT: 44.4 % (ref 39.0–52.0)
Hemoglobin: 14.2 g/dL (ref 13.0–17.0)
MCH: 29.6 pg (ref 26.0–34.0)
MCHC: 32 g/dL (ref 30.0–36.0)
MCV: 92.7 fL (ref 80.0–100.0)
Platelets: 216 K/uL (ref 150–400)
RBC: 4.79 MIL/uL (ref 4.22–5.81)
RDW: 13.6 % (ref 11.5–15.5)
WBC: 9.1 K/uL (ref 4.0–10.5)
nRBC: 0 % (ref 0.0–0.2)

## 2024-02-06 MED ORDER — CEPHALEXIN 500 MG PO CAPS: 500.0000 mg | ORAL_CAPSULE | Freq: Four times a day (QID) | ORAL | 0 refills | Status: DC

## 2024-02-06 MED ORDER — TETANUS-DIPHTHERIA TOXOIDS TD 5-2 LF/0.5ML IM SUSP
0.5000 mL | Freq: Once | INTRAMUSCULAR | Status: AC
  Administered 2024-02-06: 0.5 mL via INTRAMUSCULAR
  Filled 2024-02-06: qty 0.5

## 2024-02-06 MED ORDER — CEPHALEXIN 250 MG PO CAPS
1000.0000 mg | ORAL_CAPSULE | Freq: Once | ORAL | Status: AC
  Administered 2024-02-06: 1000 mg via ORAL
  Filled 2024-02-06: qty 4

## 2024-02-06 NOTE — Discharge Instructions (Addendum)
 It was our pleasure to provide your ER care today - we hope that you feel better.  Take antibiotic as prescribed (keflex ).  Elevate leg/foot to help with swelling.   Follow up with primary care doctor in the coming week if symptoms fail to improve/resolve.  Return to ER if worse, new symptoms, high fevers, spreading redness, increased/severe swelling, numbness/weakness, severe pain, or other concern.

## 2024-02-06 NOTE — ED Provider Notes (Signed)
 [place] EMERGENCY DEPARTMENT AT [facility] Provider Note   CSN: [id number] Arrival date & time: 02/06/24  1422     Patient presents with: Leg Swelling   Gabriel Love is a 83 y.o. male.   Pt with c/o swelling/redness to left lower leg. States had a recent fall and broke a bone in foot - is seeing ortho for that, and indicates was wearing a brace that was rubbing on lower leg/ankle area. No current antibiotic use. Systemically does not feel sick or ill. No fever or chills. No nv. Went to doctor as was told could be cellulitis and was told to go to ER.  No severe leg pain. No foot numbness/weakness. No new or worsening swelling/redness to area of foot fracture.  W prior fall, did have skin tear to forearm, no infection to site, scab. Unsure of last tetanus. No hx dvt or pe.   The history is provided by the patient, medical records and the spouse.       Prior to Admission medications   Medication Sig Start Date End Date Taking? Authorizing Provider  acetaminophen  (TYLENOL ) 325 MG tablet Take 2 tablets (650 mg total) by mouth every 6 (six) hours as needed for mild pain (pain score 1-3), headache or fever (or Fever >/= 101). 03/21/23   Pearlean Manus, MD  albuterol  (VENTOLIN  HFA) 108 (90 Base) MCG/ACT inhaler Inhale 2 puffs into the lungs every 4 (four) hours as needed for wheezing or shortness of breath. 03/21/23   Pearlean Manus, MD  aspirin  EC 81 MG tablet Take 1 tablet (81 mg total) by mouth daily with breakfast. 03/21/23   Emokpae, Courage, MD  atorvastatin  (LIPITOR) 40 MG tablet Take 1 tablet (40 mg total) by mouth daily. 08/31/23   Johnny Garnette LABOR, MD  budesonide -formoterol  (SYMBICORT ) 160-4.5 MCG/ACT inhaler Inhale 2 puffs into the lungs in the morning and at bedtime. 03/21/23   Pearlean Manus, MD  Cholecalciferol (VITAMIN D3 PO) Take 1 tablet by mouth daily at 12 noon.    [provider]  clotrimazole -betamethasone  (LOTRISONE ) cream Apply 1 Application  topically 2 (two) times daily. 08/31/23   Johnny Garnette LABOR, MD  doxycycline  (VIBRA -TABS) 100 MG tablet Take 1 tablet (100 mg total) by mouth 2 (two) times daily. Patient not taking: Reported on 12/06/2023 08/31/23   Johnny Garnette LABOR, MD  furosemide  (LASIX ) 40 MG tablet Take 1 tablet (40 mg total) by mouth daily. 07/15/23   Johnny Garnette LABOR, MD  metoprolol  succinate (TOPROL -XL) 50 MG 24 hr tablet TAKE 1 TABLET BY MOUTH EVERY DAY 07/15/23   Johnny Garnette LABOR, MD  Multiple Vitamins-Minerals (CENTRUM ADULTS PO) Take 1 tablet by mouth daily.    [provider]  potassium chloride  SA (KLOR-CON  M) 20 MEQ tablet Take 1 tablet (20 mEq total) by mouth daily. TAKE 1 TABLET(20 MEQ) BY MOUTH DAILY 08/31/23   Johnny Garnette LABOR, MD  venlafaxine  XR (EFFEXOR  XR) 150 MG 24 hr capsule Take 1 capsule (150 mg total) by mouth daily with breakfast. 01/17/24   Johnny Garnette LABOR, MD    Allergies: Ivp dye [iodinated contrast media], Penicillins, and Lisinopril     Review of Systems  Constitutional:  Negative for chills, diaphoresis and fever.  Respiratory:  Negative for shortness of breath.   Cardiovascular:  Negative for chest pain.  Gastrointestinal:  Negative for nausea and vomiting.  Musculoskeletal:        Redness/soreness to skin of left lower leg.   Neurological:  Negative for weakness and numbness.  Hematological:  Does not bruise/bleed easily.    Updated Vital Signs BP 135/71 (BP Location: Right Arm)   Pulse 63   Temp 98.3 F (36.8 C)   Resp 16   SpO2 92%   Physical Exam Vitals and nursing note reviewed.  Constitutional:      Appearance: Normal appearance. He is well-developed.  HENT:     Head: Atraumatic.     Nose: Nose normal.     Mouth/Throat:     Mouth: Mucous membranes are moist.  Eyes:     General: No scleral icterus.    Conjunctiva/sclera: Conjunctivae normal.  Neck:     Trachea: No tracheal deviation.  Cardiovascular:     Rate and Rhythm: Normal rate and regular rhythm.     Pulses: Normal  pulses.     Heart sounds: Normal heart sounds. No murmur heard.    No friction rub. No gallop.  Pulmonary:     Effort: Pulmonary effort is normal. No accessory muscle usage or respiratory distress.     Breath sounds: Normal breath sounds.  Abdominal:     General: There is no distension.     Palpations: Abdomen is soft.     Tenderness: There is no abdominal tenderness.  Musculoskeletal:     Cervical back: Neck supple.     Comments: Mild swelling to bilateral lower legs, ~ symmetric. Skin on left lower leg with increased erythema and warmth c/w cellulitis.  Some dry, scaly skin to bil lower legs, some chronic skin changes c/w chronic venous stasis.  Bil feet of normal color and warmth, with intact distal pulses.  No severe swelling. Compartments of legs soft, not tense, without severe pain.   Skin:    General: Skin is warm and dry.     Findings: No rash.  Neurological:     Mental Status: He is alert.     Comments: Alert, speech clear. Left lower leg and foot nvi.   Psychiatric:        Mood and Affect: Mood normal.     (all labs ordered are listed, but only abnormal results are displayed) Results for orders placed or performed during the hospital encounter of 02/06/24  CBC   Collection Time: 02/06/24  4:10 PM  Result Value Ref Range   WBC 9.1 4.0 - 10.5 K/uL   RBC 4.79 4.22 - 5.81 MIL/uL   Hemoglobin 14.2 13.0 - 17.0 g/dL   HCT 55.5 60.9 - 47.9 %   MCV 92.7 80.0 - 100.0 fL   MCH 29.6 26.0 - 34.0 pg   MCHC 32.0 30.0 - 36.0 g/dL   RDW 86.3 88.4 - 84.4 %   Platelets 216 150 - 400 K/uL   nRBC 0.0 0.0 - 0.2 %  Basic metabolic panel with GFR   Collection Time: 02/06/24  4:10 PM  Result Value Ref Range   Sodium 143 135 - 145 mmol/L   Potassium 4.5 3.5 - 5.1 mmol/L   Chloride 104 98 - 111 mmol/L   CO2 32 22 - 32 mmol/L   Glucose, Bld 96 70 - 99 mg/dL   BUN 16 8 - 23 mg/dL   Creatinine, Ser 9.17 0.61 - 1.24 mg/dL   Calcium  9.0 8.9 - 10.3 mg/dL   GFR, Estimated >39 >39 mL/min    Anion gap 6 5 - 15     EKG: None  Radiology: US  Venous Img Lower  Left (DVT Study) Result Date: 02/06/2024 EXAM: ULTRASOUND DUPLEX OF THE LEFT LOWER EXTREMITY VEINS TECHNIQUE:  Duplex ultrasound using B-mode/gray scaled imaging and Doppler spectral analysis and color flow was obtained of the deep venous structures of the left lower extremity. Technologist describes technically difficult study secondary to body habitus, severe edema. COMPARISON: None available. CLINICAL HISTORY: swelling, r/o dvt FINDINGS: The common femoral vein, femoral vein, popliteal vein, and posterior tibial vein of the left lower extremity demonstrate normal compressibility with normal color flow and spectral analysis. Limited images of the contralateral right common femoral vein are negative. IMPRESSION: 1. No evidence of DVT in the left lower extremity. 2. Limited images of the contralateral right common femoral vein are negative. 3. Technically difficult study secondary to body habitus and severe edema. Electronically signed by: Dayne Hassell MD 02/06/2024 05:08 PM EST RP Workstation: [id number]     Procedures   Medications Ordered in the ED  cephALEXin  (KEFLEX ) capsule 1,000 mg (1,000 mg Oral Given 02/06/24 1611)  tetanus & diphtheria toxoids (adult) (TENIVAC) injection 0.5 mL (0.5 mLs Intramuscular Given 02/06/24 1658)                                    Medical Decision Making Problems Addressed: Cellulitis of left lower leg: acute illness or injury with systemic symptoms that poses a threat to life or bodily functions  Amount and/or Complexity of Data Reviewed Independent Historian: spouse    Details: hx External Data Reviewed: notes. Labs: ordered. Decision-making details documented in ED Course. Radiology: ordered and independent interpretation performed. Decision-making details documented in ED Course.  Risk Prescription drug management. Decision regarding hospitalization.   Labs ordered/sent.  Imaging ordered.   Differential diagnosis includes cellulitis, dvt, etc. Dispo decision including potential need for admission considered - will get labs and imaging and reassess.   Reviewed nursing notes and prior charts for additional history. External reports reviewed. Additional history from: spouse.   Labs reviewed/interpreted by me - wbc and hgb normal.   U/S reviewed/interpreted by me - no dvt.   Keflex  po.   Pt appears stable for ed d/c.   Rec close pcp f/u.  Return precautions provided.       Final diagnoses:  None    ED Discharge Orders     None          Bernard Drivers, MD 02/06/24 1735

## 2024-02-06 NOTE — ED Triage Notes (Signed)
 Reports bilateral leg swelling and redness x 1 week. States was told he had cellulitis. Denies fever.

## 2024-02-22 ENCOUNTER — Ambulatory Visit: Admitting: Family

## 2024-02-29 ENCOUNTER — Ambulatory Visit: Admitting: Family

## 2024-02-29 ENCOUNTER — Ambulatory Visit: Admitting: Podiatry

## 2024-02-29 ENCOUNTER — Encounter: Payer: Self-pay | Admitting: Podiatry

## 2024-02-29 DIAGNOSIS — M7989 Other specified soft tissue disorders: Secondary | ICD-10-CM

## 2024-02-29 DIAGNOSIS — L03116 Cellulitis of left lower limb: Secondary | ICD-10-CM | POA: Diagnosis not present

## 2024-02-29 DIAGNOSIS — R609 Edema, unspecified: Secondary | ICD-10-CM

## 2024-02-29 DIAGNOSIS — M79674 Pain in right toe(s): Secondary | ICD-10-CM | POA: Diagnosis not present

## 2024-02-29 DIAGNOSIS — B351 Tinea unguium: Secondary | ICD-10-CM | POA: Diagnosis not present

## 2024-02-29 DIAGNOSIS — I739 Peripheral vascular disease, unspecified: Secondary | ICD-10-CM | POA: Diagnosis not present

## 2024-02-29 DIAGNOSIS — M79675 Pain in left toe(s): Secondary | ICD-10-CM | POA: Diagnosis not present

## 2024-02-29 MED ORDER — DOXYCYCLINE HYCLATE 100 MG PO TABS: 100.0000 mg | ORAL_TABLET | Freq: Two times a day (BID) | ORAL | 0 refills | Status: DC

## 2024-02-29 NOTE — Progress Notes (Unsigned)
 Office Visit Note   Patient: Gabriel Love           Date of Birth: June 19, 1940           MRN: [id number] Visit Date: 02/29/2024              Requested by: Johnny Garnette LABOR, MD [address] [place],  [place] [address] PCP: Johnny Garnette LABOR, MD  Chief Complaint  Patient presents with   Right Leg - Wound Check   Left Leg - Wound Check      HPI: ***  Assessment & Plan: Visit Diagnoses: No diagnosis found.  Plan: ***  Follow-Up Instructions: No follow-ups on file.   Ortho Exam  Patient is alert, oriented, no adenopathy, well-dressed, normal affect, normal respiratory effort. ***    Imaging: No results found. No images are attached to the encounter.  Labs: Lab Results  Component Value Date   HGBA1C 5.3 01/21/2020   HGBA1C 5.5 02/19/2019   HGBA1C 5.6 02/28/2015   ESRSEDRATE 14 06/06/2013   REPTSTATUS 05/13/2020 FINAL 05/08/2020   CULT  05/08/2020    NO GROWTH 5 DAYS Performed at [facility], [address], [place], [place] [address]    LABORGA PSEUDOMONAS AERUGINOSA (A) 05/08/2020     Lab Results  Component Value Date   ALBUMIN 3.3 (L) 03/20/2023   ALBUMIN 3.5 03/19/2023   ALBUMIN 3.4 (L) 01/01/2022    Lab Results  Component Value Date   MG 2.4 03/20/2023   MG 2.0 01/04/2022   MG 1.7 05/08/2020   Lab Results  Component Value Date   VD25OH 44.91 05/09/2020    No results found for: PREALBUMIN    Latest Ref Rng & Units 02/06/2024    4:10 PM 03/20/2023    3:41 AM 03/19/2023    9:45 PM  CBC EXTENDED  WBC 4.0 - 10.5 K/uL 9.1  8.6  10.2   RBC 4.22 - 5.81 MIL/uL 4.79  5.16  5.21   Hemoglobin 13.0 - 17.0 g/dL 85.7  84.7  84.1   HCT 39.0 - 52.0 % 44.4  48.3  49.0   Platelets 150 - 400 K/uL 216  166  185      There is no height or weight on file to calculate BMI.  Orders:  No orders of the defined types were placed in this encounter.  Meds ordered this encounter  Medications   doxycycline  (VIBRA -TABS) 100 MG tablet    Sig: Take 1  tablet (100 mg total) by mouth 2 (two) times daily.    Dispense:  20 tablet    Refill:  0     Procedures: No procedures performed  Clinical Data: No additional findings.  ROS:  All other systems negative, except as noted in the HPI. Review of Systems  Objective: Vital Signs: There were no vitals taken for this visit.  Specialty Comments:  No specialty comments available.  PMFS History: Patient Active Problem List   Diagnosis Date Noted   Acute bronchitis 03/20/2023   Acute respiratory failure with hypoxia and hypercapnia (HCC) 03/20/2023   Elevated brain natriuretic peptide (BNP) level 03/20/2023   Acute respiratory failure with hypoxia (HCC) 03/20/2023   Pain due to onychomycosis of toenails of both feet 04/27/2022   Multifocal pneumonia 01/02/2022   Asthma exacerbation 01/02/2022   Acute gastroenteritis 05/09/2020   Sepsis secondary to UTI (HCC) 05/08/2020   Grade I diastolic dysfunction 05/08/2020   Class 1 obesity 05/08/2020   Hyponatremia 05/08/2020   Hypokalemia  05/08/2020   Hypoalbuminemia 05/08/2020   Mild protein malnutrition 05/08/2020   Depression with anxiety 07/24/2019   Obesity, Class III, BMI 40-49.9 (morbid obesity) (HCC) 04/04/2018   Bilateral leg edema 02/07/2017   Osteoarthritis 01/28/2017   Urge and stress incontinence 01/28/2017   History of colonic polyps    Colon polyp 08/07/2013   Benign neoplasm of ileocecal valve, 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   Nonspecific abnormal finding in stool contents 05/29/2013   Atherosclerotic peripheral vascular disease with intermittent claudication (HCC) 06/14/2012   Spinal stenosis of lumbar region 06/14/2012   DEGENERATIVE DISC DISEASE, 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   Mixed hyperlipidemia 11/23/2006   Essential hypertension 11/23/2006   HEMATURIA 11/23/2006   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   Past Medical History:  Diagnosis Date   Abdominal hernia    Allergy    Blood transfusion without reported  diagnosis 12/2006   At [facility] with hip replacement - 2 units transfused   Cancer (HCC)    skin cancers   Cataract    Depression    DJD (degenerative joint disease)    shoulders bilateral   Environmental allergies    Full dentures    Grade I diastolic dysfunction 05/08/2020   Hearing loss    bilateral hearing aids   History of colonic polyps    History of urinary tract infection    Hyperlipidemia    under control   Hypertension    borderline   Nonspecific abnormal finding in stool contents    Other general symptoms(780.99)    Prostate hypertrophy    sees Dr. Chales    Urinary leakage     Family History  Problem Relation Age of Onset   Hypertension Mother    Diabetes Mother    Heart failure Mother        cad/mi with rupture ventricle   Hyperlipidemia Father    Colon polyps Father    Cancer Neg Hx        colon or prostate   Colon cancer Neg Hx    Esophageal cancer Neg Hx    Rectal cancer Neg Hx    Stomach cancer Neg Hx     Past Surgical History:  Procedure Laterality Date   CATARACT EXTRACTION W/PHACO Right 03/25/2014   Procedure: CATARACT EXTRACTION PHACO AND INTRAOCULAR LENS PLACEMENT; CDE:9.76;  Surgeon: Dow JULIANNA Burke, MD;  Location: [facility];  Service: Ophthalmology;  Laterality: Right;   CATARACT EXTRACTION W/PHACO Left 06/24/2014   Procedure: CATARACT EXTRACTION PHACO AND INTRAOCULAR LENS PLACEMENT (IOC);  Surgeon: Dow JULIANNA Burke, MD;  Location: [facility];  Service: Ophthalmology;  Laterality: Left;  CDE:8.45   CIRCUMCISION N/A 03/26/2016   Procedure: CIRCUMCISION ADULT;  Surgeon: Arlena Chales, MD;  Location: [facility];  Service: Urology;  Laterality: N/AMERL IN[place], NON-NEWBORN  1985   COLONOSCOPY  06-19-13, 03/05/15   per Dr. Debrah, mass at the ileocecal valve plus several polyps, pathology benign, repeat in one year, polyp 2016    COLONOSCOPY WITH PROPOFOL  N/A 07/29/2015   Procedure: COLONOSCOPY WITH PROPOFOL ;  Surgeon: Elspeth Deward Naval, MD;   Location: [facility];  Service: Gastroenterology;  Laterality: N/A;   JOINT REPLACEMENT  12/2006   total left hip per Dr. Beverley    LAPAROSCOPIC PARTIAL COLECTOMY N/A 08/07/2013   Procedure: LAPAROSCOPIC right PARTIAL COLECTOMY;  Surgeon: Bernarda Ned, MD;  Location: [facility];  Service: General;  Laterality: N/A;   MULTIPLE TOOTH EXTRACTIONS     PROSTATE SURGERY  03-29-11   had CTT per Dr.  Tannenbaum, in office   Social History   Occupational History   Occupation: Retired  Tobacco Use   Smoking status: Former    Current packs/day: 0.00    Average packs/day: 1 pack/day for 5.0 years (5.0 ttl pk-yrs)    Types: Cigarettes    Start date: 03/23/1963    Quit date: 03/22/1968    Years since quitting: 55.9   Smokeless tobacco: Current    Types: Chew    Last attempt to quit: 09/20/2015   Tobacco comments:    occassionally  Vaping Use   Vaping status: Never Used  Substance and Sexual Activity   Alcohol use: No    Alcohol/week: 0.0 standard drinks of alcohol   Drug use: No   Sexual activity: Not on file

## 2024-03-07 ENCOUNTER — Encounter: Payer: Self-pay | Admitting: Family

## 2024-03-07 ENCOUNTER — Ambulatory Visit: Admitting: Family

## 2024-03-07 DIAGNOSIS — L03116 Cellulitis of left lower limb: Secondary | ICD-10-CM

## 2024-03-07 DIAGNOSIS — I872 Venous insufficiency (chronic) (peripheral): Secondary | ICD-10-CM

## 2024-03-07 DIAGNOSIS — R600 Localized edema: Secondary | ICD-10-CM | POA: Diagnosis not present

## 2024-03-07 NOTE — Progress Notes (Signed)
 Office Visit Note   Patient: Gabriel Love           Date of Birth: Aug 09, 1940           MRN: [id number] Visit Date: 03/07/2024              Requested by: Johnny Garnette LABOR, MD [address] [place],  [place] [address] PCP: Johnny Garnette LABOR, MD  Chief Complaint  Patient presents with   Right Leg - Follow-up   Left Leg - Follow-up      HPI: The patient is an 83 year old gentleman who is seen today in follow-up for chronic venous insufficiency and lymphedema bilateral lower extremities he recently was treated for cellulitis of the left lower extremity.  He was in a Profore dressing for the last 1 week due to uncontrolled edema on the left he was having difficulty donning his double upright brace on the left due to edema  He will be completing his doxycycline  in 2 days  Assessment & Plan: Visit Diagnoses: No diagnosis found.  Plan: He will resume his compression garments bilateral lower extremities.  Given an order today for new compression garments and instructions on how to obtain these locally he will follow-up in the office as needed  Follow-Up Instructions: No follow-ups on file.   Ortho Exam  Patient is alert, oriented, no adenopathy, well-dressed, normal affect, normal respiratory effort. On examination left lower extremity there is good wrinkling of the skin from compression significant reduction in his edema without erythema warmth there is no ulceration no weeping    Imaging: No results found. No images are attached to the encounter.  Labs: Lab Results  Component Value Date   HGBA1C 5.3 01/21/2020   HGBA1C 5.5 02/19/2019   HGBA1C 5.6 02/28/2015   ESRSEDRATE 14 06/06/2013   REPTSTATUS 05/13/2020 FINAL 05/08/2020   CULT  05/08/2020    NO GROWTH 5 DAYS Performed at [facility], [address], [place], [place] [address]    LABORGA PSEUDOMONAS AERUGINOSA (A) 05/08/2020     Lab Results  Component Value Date   ALBUMIN 3.3 (L) 03/20/2023   ALBUMIN 3.5  03/19/2023   ALBUMIN 3.4 (L) 01/01/2022    Lab Results  Component Value Date   MG 2.4 03/20/2023   MG 2.0 01/04/2022   MG 1.7 05/08/2020   Lab Results  Component Value Date   VD25OH 44.91 05/09/2020    No results found for: PREALBUMIN    Latest Ref Rng & Units 02/06/2024    4:10 PM 03/20/2023    3:41 AM 03/19/2023    9:45 PM  CBC EXTENDED  WBC 4.0 - 10.5 K/uL 9.1  8.6  10.2   RBC 4.22 - 5.81 MIL/uL 4.79  5.16  5.21   Hemoglobin 13.0 - 17.0 g/dL 85.7  84.7  84.1   HCT 39.0 - 52.0 % 44.4  48.3  49.0   Platelets 150 - 400 K/uL 216  166  185      There is no height or weight on file to calculate BMI.  Orders:  No orders of the defined types were placed in this encounter.  No orders of the defined types were placed in this encounter.    Procedures: No procedures performed  Clinical Data: No additional findings.  ROS:  All other systems negative, except as noted in the HPI. Review of Systems  Objective: Vital Signs: There were no vitals taken for this visit.  Specialty Comments:  No specialty comments available.  PMFS History: Patient Active Problem List   Diagnosis Date Noted   Acute bronchitis 03/20/2023   Acute respiratory failure with hypoxia and hypercapnia (HCC) 03/20/2023   Elevated brain natriuretic peptide (BNP) level 03/20/2023   Acute respiratory failure with hypoxia (HCC) 03/20/2023   Pain due to onychomycosis of toenails of both feet 04/27/2022   Multifocal pneumonia 01/02/2022   Asthma exacerbation 01/02/2022   Acute gastroenteritis 05/09/2020   Sepsis secondary to UTI (HCC) 05/08/2020   Grade I diastolic dysfunction 05/08/2020   Class 1 obesity 05/08/2020   Hyponatremia 05/08/2020   Hypokalemia 05/08/2020   Hypoalbuminemia 05/08/2020   Mild protein malnutrition 05/08/2020   Depression with anxiety 07/24/2019   Obesity, Class III, BMI 40-49.9 (morbid obesity) (HCC) 04/04/2018   Bilateral leg edema 02/07/2017   Osteoarthritis  01/28/2017   Urge and stress incontinence 01/28/2017   History of colonic polyps    Colon polyp 08/07/2013   Benign neoplasm of ileocecal valve, 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   Nonspecific abnormal finding in stool contents 05/29/2013   Atherosclerotic peripheral vascular disease with intermittent claudication (HCC) 06/14/2012   Spinal stenosis of lumbar region 06/14/2012   DEGENERATIVE DISC DISEASE, 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   Mixed hyperlipidemia 11/23/2006   Essential hypertension 11/23/2006   HEMATURIA 11/23/2006   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   Past Medical History:  Diagnosis Date   Abdominal hernia    Allergy    Blood transfusion without reported diagnosis 12/2006   At [facility] with [facility] replacement - 2 units transfused   Cancer (HCC)    skin cancers   Cataract    Depression    DJD (degenerative joint disease)    shoulders bilateral   Environmental allergies    Full dentures    Grade I diastolic dysfunction 05/08/2020   Hearing loss    bilateral hearing aids   History of colonic polyps    History of urinary tract infection    Hyperlipidemia    under control   Hypertension    borderline   Nonspecific abnormal finding in stool contents    Other general symptoms(780.99)    Prostate hypertrophy    sees Dr. Chales    Urinary leakage     Family History  Problem Relation Age of Onset   Hypertension Mother    Diabetes Mother    Heart failure Mother        cad/mi with rupture ventricle   Hyperlipidemia Father    Colon polyps Father    Cancer Neg Hx        colon or prostate   Colon cancer Neg Hx    Esophageal cancer Neg Hx    Rectal cancer Neg Hx    Stomach cancer Neg Hx     Past Surgical History:  Procedure Laterality Date   CATARACT EXTRACTION W/PHACO Right 03/25/2014   Procedure: CATARACT EXTRACTION PHACO AND INTRAOCULAR LENS PLACEMENT; CDE:9.76;  Surgeon: Dow JULIANNA Burke, MD;  Location: [facility];  Service: Ophthalmology;  Laterality: Right;   CATARACT  EXTRACTION W/PHACO Left 06/24/2014   Procedure: CATARACT EXTRACTION PHACO AND INTRAOCULAR LENS PLACEMENT (IOC);  Surgeon: Dow JULIANNA Burke, MD;  Location: [facility];  Service: Ophthalmology;  Laterality: Left;  CDE:8.45   CIRCUMCISION N/A 03/26/2016   Procedure: CIRCUMCISION ADULT;  Surgeon: Arlena Chales, MD;  Location: [facility];  Service: Urology;  Laterality: N/AMERL IN[place], NON-NEWBORN  1985   COLONOSCOPY  06-19-13, 03/05/15   per Dr. 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 Beverley    LAPAROSCOPIC PARTIAL COLECTOMY N/A 08/07/2013   Procedure: LAPAROSCOPIC right PARTIAL COLECTOMY;  Surgeon: Bernarda Ned, MD;  Location: [facility];  Service: General;  Laterality: N/A;   MULTIPLE TOOTH EXTRACTIONS     PROSTATE SURGERY  03-29-11   had CTT per Dr. Chales, in office   Social History   Occupational History   Occupation: Retired  Tobacco Use   Smoking status: Former    Current packs/day: 0.00    Average packs/day: 1 pack/day for 5.0 years (5.0 ttl pk-yrs)    Types: Cigarettes    Start date: 03/23/1963    Quit date: 03/22/1968    Years since quitting: 55.9   Smokeless tobacco: Current    Types: Chew    Last attempt to quit: 09/20/2015   Tobacco comments:    occassionally  Vaping Use   Vaping status: Never Used  Substance and Sexual Activity   Alcohol use: No    Alcohol/week: 0.0 standard drinks of alcohol   Drug use: No   Sexual activity: Not on file

## 2024-03-08 ENCOUNTER — Encounter: Payer: Self-pay | Admitting: Podiatry

## 2024-03-08 NOTE — Progress Notes (Signed)
 Subjective:  Patient ID: Gabriel Love, male    DOB: 1941-02-29,  MRN: [id number]  Gabriel Love presents to clinic today for at risk foot care. Patient has h/o PAD and painful thick toenails that are difficult to trim. Pain interferes with ambulation. Aggravating factors include wearing enclosed shoe gear. Pain is relieved with periodic professional debridement. He is accompanied by his wife on today's visit. States both great toes are tender. Chief Complaint  Patient presents with   RFC    Rm16 Routine foot care/ Dr. Garnette Olmsted last visit June 11,2025   New problem(s): None.   PCP is Olmsted Garnette LABOR, MD.  Allergies[1]  Review of Systems: Negative except as noted in the HPI.  Objective: No changes noted in today's physical examination. There were no vitals filed for this visit. Gabriel Love is a pleasant 83 y.o. male morbidly obese in NAD. AAO x 3.  Vascular Examination: CFT <3 seconds b/l. DP pulses faintly palpable b/l. PT pulses nonpalpable b/l. Digital hair absent. Skin temperature gradient warm to warm b/l. No pain with calf compression. No ischemia or gangrene. No cyanosis or clubbing noted b/l. Nonpitting edema noted BLE. Evidence of chronic venous insufficiency b/l LE.   Neurological Examination: Sensation grossly intact b/l with 10 gram monofilament. Vibratory sensation intact b/l.   Dermatological Examination: Incurvated nailplate lateral border left hallux and medial border right hallux with tenderness to palpation. No erythema, no edema, no drainage noted. No fluctuance.   Pedal skin warm and supple b/l. No interdigital macerations. Toenails 2-5 b/l thick, discolored, elongated with subungual debris and pain on dorsal palpation.  No corns, calluses nor porokeratotic lesions noted.  Musculoskeletal Examination: Dropfoot left foot. Muscle strength 5/5 to all LE muscle groups of right foot. Pes planus b/l. Wearing shoe with attached brace left foot.  Radiographs:  None  Assessment/Plan: 1. Pain due to onychomycosis of toenails of both feet   2. Peripheral vascular disease with claudication   -Patient's family member present. All questions/concerns addressed on today's visit. -Consent given for treatment as described below: -Examined patient. -Patient to continue soft, supportive shoe gear daily. -Mycotic toenails 2-5 bilaterally were debrided in length and girth with sterile nail nippers and dremel without iatrogenic bleeding. -No invasive procedure(s) performed. Offending nail border debrided and curretaged bilateral great toes utilizing sterile nail nipper and currette. Border(s) cleansed with alcohol and triple antibiotic ointment applied. Patient/POA/Caregiver/Facility instructed to apply Neosporin Cream  to bilateral great toes once daily for 7 days. Call office if there are any concerns. -Patient/POA to call should there be question/concern in the interim.   Return in about 3 months (around 05/29/2024).  Gabriel Love, DPM      [facility] LOCATION: [address], [place] [address]                   Office [phone]   [facility] LOCATION: [address] [place], [place] [address] Office [phone]     [1]  Allergies Allergen Reactions   Ivp Dye [Iodinated Contrast Media] Anaphylaxis    Iv dye allergy in 1985, anaphylaxix//a.calhoun- shocked back to life   Penicillins Other (See Comments)    Urticaria Has patient had  a PCN reaction causing immediate rash, facial/tongue/throat swelling, SOB or lightheadedness with hypotension: no Has patient had a PCN reaction causing severe rash involving mucus membranes or skin necrosis: no Has patient had a PCN reaction that required hospitalization no Has patient had a PCN reaction occurring within the last 10 years: yes If all of the above answers are NO, then may proceed with Cephalosporin use.    Lisinopril  Cough

## 2024-03-11 ENCOUNTER — Encounter: Payer: Self-pay | Admitting: Family

## 2024-04-17 ENCOUNTER — Ambulatory Visit: Payer: Self-pay

## 2024-04-17 ENCOUNTER — Other Ambulatory Visit: Payer: Self-pay

## 2024-04-17 ENCOUNTER — Encounter (HOSPITAL_COMMUNITY): Payer: Self-pay

## 2024-04-17 ENCOUNTER — Emergency Department (HOSPITAL_COMMUNITY)

## 2024-04-17 ENCOUNTER — Observation Stay (HOSPITAL_COMMUNITY)
Admission: EM | Admit: 2024-04-17 | Discharge: 2024-04-18 | Disposition: A | Discharge status: Home / Self Care | Attending: Internal Medicine | Admitting: Internal Medicine

## 2024-04-17 DIAGNOSIS — A419 Sepsis, unspecified organism: Principal | ICD-10-CM | POA: Insufficient documentation

## 2024-04-17 DIAGNOSIS — R6520 Severe sepsis without septic shock: Secondary | ICD-10-CM | POA: Diagnosis not present

## 2024-04-17 DIAGNOSIS — E66813 Obesity, class 3: Secondary | ICD-10-CM | POA: Diagnosis not present

## 2024-04-17 DIAGNOSIS — I110 Hypertensive heart disease with heart failure: Secondary | ICD-10-CM | POA: Diagnosis not present

## 2024-04-17 DIAGNOSIS — Z7982 Long term (current) use of aspirin: Secondary | ICD-10-CM | POA: Insufficient documentation

## 2024-04-17 DIAGNOSIS — R300 Dysuria: Secondary | ICD-10-CM | POA: Diagnosis present

## 2024-04-17 DIAGNOSIS — Z87891 Personal history of nicotine dependence: Secondary | ICD-10-CM | POA: Diagnosis not present

## 2024-04-17 DIAGNOSIS — E8729 Other acidosis: Secondary | ICD-10-CM | POA: Diagnosis not present

## 2024-04-17 DIAGNOSIS — R319 Hematuria, unspecified: Secondary | ICD-10-CM

## 2024-04-17 DIAGNOSIS — I5032 Chronic diastolic (congestive) heart failure: Secondary | ICD-10-CM | POA: Insufficient documentation

## 2024-04-17 DIAGNOSIS — E872 Acidosis, unspecified: Secondary | ICD-10-CM

## 2024-04-17 DIAGNOSIS — N390 Urinary tract infection, site not specified: Secondary | ICD-10-CM | POA: Diagnosis not present

## 2024-04-17 DIAGNOSIS — J45909 Unspecified asthma, uncomplicated: Secondary | ICD-10-CM | POA: Diagnosis not present

## 2024-04-17 DIAGNOSIS — Z79899 Other long term (current) drug therapy: Secondary | ICD-10-CM | POA: Diagnosis not present

## 2024-04-17 DIAGNOSIS — Z6841 Body Mass Index (BMI) 40.0 and over, adult: Secondary | ICD-10-CM | POA: Diagnosis not present

## 2024-04-17 DIAGNOSIS — E876 Hypokalemia: Secondary | ICD-10-CM | POA: Insufficient documentation

## 2024-04-17 DIAGNOSIS — E785 Hyperlipidemia, unspecified: Secondary | ICD-10-CM | POA: Insufficient documentation

## 2024-04-17 DIAGNOSIS — N401 Enlarged prostate with lower urinary tract symptoms: Principal | ICD-10-CM

## 2024-04-17 LAB — CBC WITH DIFFERENTIAL/PLATELET
Abs Immature Granulocytes: 0.05 10*3/uL (ref 0.00–0.07)
Basophils Absolute: 0.1 10*3/uL (ref 0.0–0.1)
Basophils Relative: 0 %
Eosinophils Absolute: 0.2 10*3/uL (ref 0.0–0.5)
Eosinophils Relative: 2 %
HCT: 45.4 % (ref 39.0–52.0)
Hemoglobin: 14.4 g/dL (ref 13.0–17.0)
Immature Granulocytes: 0 %
Lymphocytes Relative: 18 %
Lymphs Abs: 2 10*3/uL (ref 0.7–4.0)
MCH: 29.5 pg (ref 26.0–34.0)
MCHC: 31.7 g/dL (ref 30.0–36.0)
MCV: 93 fL (ref 80.0–100.0)
Monocytes Absolute: 0.8 10*3/uL (ref 0.1–1.0)
Monocytes Relative: 7 %
Neutro Abs: 8.2 10*3/uL — ABNORMAL HIGH (ref 1.7–7.7)
Neutrophils Relative %: 73 %
Platelets: 217 10*3/uL (ref 150–400)
RBC: 4.88 MIL/uL (ref 4.22–5.81)
RDW: 15 % (ref 11.5–15.5)
WBC: 11.3 10*3/uL — ABNORMAL HIGH (ref 4.0–10.5)
nRBC: 0 % (ref 0.0–0.2)

## 2024-04-17 LAB — URINALYSIS, W/ REFLEX TO CULTURE (INFECTION SUSPECTED)
Bilirubin Urine: NEGATIVE
Glucose, UA: NEGATIVE mg/dL
Ketones, ur: NEGATIVE mg/dL
Nitrite: POSITIVE — AB
Protein, ur: 100 mg/dL — AB
Specific Gravity, Urine: 1.021 (ref 1.005–1.030)
WBC, UA: >50 WBC/hpf (ref 0–5)
pH: 6 (ref 5.0–8.0)

## 2024-04-17 LAB — BASIC METABOLIC PANEL WITH GFR
Anion gap: 11 (ref 5–15)
BUN: 18 mg/dL (ref 8–23)
CO2: 29 mmol/L (ref 22–32)
Calcium: 8.4 mg/dL — ABNORMAL LOW (ref 8.9–10.3)
Chloride: 104 mmol/L (ref 98–111)
Creatinine, Ser: 0.85 mg/dL (ref 0.61–1.24)
GFR, Estimated: >60 mL/min
Glucose, Bld: 104 mg/dL — ABNORMAL HIGH (ref 70–99)
Potassium: 3.3 mmol/L — ABNORMAL LOW (ref 3.5–5.1)
Sodium: 145 mmol/L (ref 135–145)

## 2024-04-17 LAB — PRO BRAIN NATRIURETIC PEPTIDE: Pro Brain Natriuretic Peptide: 109 pg/mL

## 2024-04-17 LAB — LACTIC ACID, PLASMA: Lactic Acid, Venous: 2.3 mmol/L (ref 0.5–1.9)

## 2024-04-17 MED ORDER — SODIUM CHLORIDE 0.9 % IV SOLN
2.0000 g | Freq: Once | INTRAVENOUS | Status: AC
  Administered 2024-04-17: 2 g via INTRAVENOUS
  Filled 2024-04-17: qty 20

## 2024-04-17 MED ORDER — SODIUM CHLORIDE 0.9 % IV BOLUS
500.0000 mL | Freq: Once | INTRAVENOUS | Status: AC
  Administered 2024-04-17: 500 mL via INTRAVENOUS

## 2024-04-17 NOTE — ED Provider Notes (Signed)
 " [place] EMERGENCY DEPARTMENT AT [facility] Provider Note   CSN: [id number] Arrival date & time: 04/17/24  2039     Patient presents with: Dysuria   Gabriel Love is a 84 y.o. male.  He is brought in by ambulance from home.  Complaining of burning with urination that started yesterday.  Having difficulty urinating.  Denies any fevers chills nausea vomiting or abdominal pain.  Chronic shortness of breath, states no worse than usual.  History of peripheral vascular disease and chronic back issues, CHF and asthma.  {Add pertinent medical, surgical, social history, OB history to YEP:67052} The history is provided by the patient and the spouse.  Dysuria Presenting symptoms: dysuria   Context: during urination   Relieved by:  None tried Associated symptoms: urinary hesitation   Associated symptoms: no abdominal pain, no fever, no hematuria, no nausea and no vomiting        Prior to Admission medications  Medication Sig Start Date End Date Taking? Authorizing Provider  acetaminophen  (TYLENOL ) 325 MG tablet Take 2 tablets (650 mg total) by mouth every 6 (six) hours as needed for mild pain (pain score 1-3), headache or fever (or Fever >/= 101). 03/21/23   Pearlean Manus, MD  albuterol  (VENTOLIN  HFA) 108 (90 Base) MCG/ACT inhaler Inhale 2 puffs into the lungs every 4 (four) hours as needed for wheezing or shortness of breath. 03/21/23   Pearlean Manus, MD  aspirin  EC 81 MG tablet Take 1 tablet (81 mg total) by mouth daily with breakfast. 03/21/23   Emokpae, Courage, MD  atorvastatin  (LIPITOR) 40 MG tablet Take 1 tablet (40 mg total) by mouth daily. 08/31/23   Johnny Garnette LABOR, MD  budesonide -formoterol  (SYMBICORT ) 160-4.5 MCG/ACT inhaler Inhale 2 puffs into the lungs in the morning and at bedtime. 03/21/23   Pearlean Manus, MD  cephALEXin  (KEFLEX ) 500 MG capsule Take 1 capsule (500 mg total) by mouth 4 (four) times daily. 02/06/24   Bernard Drivers, MD  Cholecalciferol (VITAMIN  D3 PO) Take 1 tablet by mouth daily at 12 noon.    [provider]  clotrimazole -betamethasone  (LOTRISONE ) cream Apply 1 Application topically 2 (two) times daily. 08/31/23   Johnny Garnette LABOR, MD  doxycycline  (VIBRA -TABS) 100 MG tablet Take 1 tablet (100 mg total) by mouth 2 (two) times daily. 02/29/24   Valdemar Rocky SAUNDERS, NP  furosemide  (LASIX ) 40 MG tablet Take 1 tablet (40 mg total) by mouth daily. 07/15/23   Johnny Garnette LABOR, MD  metoprolol  succinate (TOPROL -XL) 50 MG 24 hr tablet TAKE 1 TABLET BY MOUTH EVERY DAY 07/15/23   Johnny Garnette LABOR, MD  Multiple Vitamins-Minerals (CENTRUM ADULTS PO) Take 1 tablet by mouth daily.    [provider]  potassium chloride  SA (KLOR-CON  M) 20 MEQ tablet Take 1 tablet (20 mEq total) by mouth daily. TAKE 1 TABLET(20 MEQ) BY MOUTH DAILY 08/31/23   Johnny Garnette LABOR, MD  venlafaxine  XR (EFFEXOR  XR) 150 MG 24 hr capsule Take 1 capsule (150 mg total) by mouth daily with breakfast. 01/17/24   Johnny Garnette LABOR, MD    Allergies: Ivp dye [iodinated contrast media], Penicillins, and Lisinopril     Review of Systems  Constitutional:  Negative for fever.  Gastrointestinal:  Negative for abdominal pain, nausea and vomiting.  Genitourinary:  Positive for dysuria and hesitancy. Negative for hematuria.    Updated Vital Signs BP (!) 129/96 (BP Location: Left Arm)   Pulse (!) 127   Temp 98.1 F (36.7 C) (Oral)  Resp 16   Ht 6' (1.829 m)   Wt (!) 151.5 kg   SpO2 95%   BMI 45.30 kg/m   Physical Exam Vitals and nursing note reviewed.  Constitutional:      Appearance: He is well-developed. He is obese.  HENT:     Head: Normocephalic and atraumatic.  Eyes:     Conjunctiva/sclera: Conjunctivae normal.  Cardiovascular:     Rate and Rhythm: Normal rate and regular rhythm.     Heart sounds: No murmur heard. Pulmonary:     Effort: Pulmonary effort is normal. No respiratory distress.     Breath sounds: Wheezing present.  Abdominal:     Palpations: Abdomen is  soft.     Tenderness: There is no abdominal tenderness. There is no guarding or rebound.  Musculoskeletal:     Cervical back: Neck supple.     Right lower leg: Edema present.     Left lower leg: Edema present.  Skin:    General: Skin is warm and dry.  Neurological:     General: No focal deficit present.     Mental Status: He is alert.     GCS: GCS eye subscore is 4. GCS verbal subscore is 5. GCS motor subscore is 6.     Comments: Left lower extremity with foot drop and brace     (all labs ordered are listed, but only abnormal results are displayed) Labs Reviewed  CULTURE, BLOOD (ROUTINE X 2)  CULTURE, BLOOD (ROUTINE X 2)  BASIC METABOLIC PANEL WITH GFR  CBC WITH DIFFERENTIAL/PLATELET  LACTIC ACID, PLASMA  LACTIC ACID, PLASMA  URINALYSIS, W/ REFLEX TO CULTURE (INFECTION SUSPECTED)    EKG: None  Radiology: No results found.  {Document cardiac monitor, telemetry assessment procedure when appropriate:32947} Procedures   Medications Ordered in the ED - No data to display    {Click here for ABCD2, HEART and other calculators REFRESH Note before signing:1}                              Medical Decision Making Amount and/or Complexity of Data Reviewed Labs: ordered.   This patient complains of ***; this involves an extensive number of treatment Options and is a complaint that carries with it a high risk of complications and morbidity. The differential includes ***  I ordered, reviewed and interpreted labs, which included *** I ordered medication *** and reviewed PMP when indicated. I ordered imaging studies which included *** and I independently    visualized and interpreted imaging which showed *** Additional history obtained from *** Previous records obtained and reviewed *** I consulted *** and discussed lab and imaging findings and discussed disposition.  Cardiac monitoring reviewed, *** Social determinants considered, *** Critical Interventions: ***  After the  interventions stated above, I reevaluated the patient and found *** Admission and further testing considered, ***   {Document critical care time when appropriate  Document review of labs and clinical decision tools ie CHADS2VASC2, etc  Document your independent review of radiology images and any outside records  Document your discussion with family members, caretakers and with consultants  Document social determinants of health affecting pt's care  Document your decision making why or why not admission, treatments were needed:32947:::1}   Final diagnoses:  None    ED Discharge Orders     None        "

## 2024-04-17 NOTE — ED Triage Notes (Signed)
 Pt bib EMS from home c/o pain and burning when urinating since yesterday. Pt reports hx of UTIs.    EMS: Afebrile HR 130 500 mL normal saline

## 2024-04-17 NOTE — Telephone Encounter (Signed)
 FYI Only or Action Required?: Action required by provider: clinical question for provider and patient not yet evaluated, weather limiting ability to be seen in clinic.  Wife calls back in to inquire if response received from prior triage contact and also asking if patient should stop taking diuretic.  Patient was last seen in primary care on 08/31/2023 by Johnny Garnette LABOR, MD.  Called Nurse Triage reporting Medication Problem (Question. ).  See prior triage encounter.   Symptoms are: stable- unchanged.  Triage Disposition: Call PCP When Office is Open  Patient/caregiver understands and will follow disposition?: Yes    Copied from CRM #[id number]. Topic: Clinical - Red Word Triage >> Apr 17, 2024  2:59 PM Alexandria E wrote: [facility] that prompted transfer to Nurse Triage:  Pain and burning when voiding.   Please warm transfer Red Word call to Nurse Triage and then click Send Message and Close CRM. >> Apr 17, 2024  3:00 PM Alexandria E wrote: Reason for Triage: Pain and burning when voiding.    Reason for Disposition  [1] Caller has NON-URGENT medicine question about med that PCP prescribed AND [2] triager unable to answer question  Answer Assessment - Initial Assessment Questions Wife calls in to follow-up on any response from office and ask medication question.  Patient was previously triaged and advised to be seen within 24 hours however weather is limiting transportation ability.   Wife has been waiting for response from PCP office.    NAME of MEDICINE: What medicine(s) are you calling about?     Fluid pill (diuretic).   QUESTION: What is your question? (e.g., double dose of medicine, side effect)     If this is a kidney infection, should he be taking this medication?  SYMPTOMS: Do you have any symptoms? If Yes, ask: What symptoms are you having?  How bad are the symptoms (e.g., mild, moderate, severe)     Patient has not yet been evaluated nor diagnosed with kidney  infection.  Wife does not report any changes or worsening of symptoms during this conversation since previous triage call.  RN informs wife that provider will have to make that determination and patient should be seen to be physically evaluated.  Encourages patient take medications as prescribed and if dizzy/ light-headed/ weakness or symptoms getting worse, should be calling 911 for transport to ED.  Protocols used: Medication Question Call-A-AH

## 2024-04-17 NOTE — H&P (Signed)
 " History and Physical    Patient: Gabriel Love FMW:[id number] DOB: 06-27-1940 DOA: 04/17/2024 DOS: the patient was seen and examined on 04/17/2024 PCP: Johnny Garnette LABOR, MD  Patient coming from: {Point_of_Origin:[address]777}  Chief Complaint:  Chief Complaint  Patient presents with   Dysuria   HPI: Gabriel Love is a 84 y.o. male with medical history significant of ***  Review of Systems: {ROS_Text:26778} Past Medical History:  Diagnosis Date   Abdominal hernia    Allergy    Blood transfusion without reported diagnosis 12/2006   At [facility] with hip replacement - 2 units transfused   Cancer (HCC)    skin cancers   Cataract    Depression    DJD (degenerative joint disease)    shoulders bilateral   Environmental allergies    Full dentures    Grade I diastolic dysfunction 05/08/2020   Hearing loss    bilateral hearing aids   History of colonic polyps    History of urinary tract infection    Hyperlipidemia    under control   Hypertension    borderline   Nonspecific abnormal finding in stool contents    Other general symptoms(780.99)    Prostate hypertrophy    sees Dr. Chales    Urinary leakage    Past Surgical History:  Procedure Laterality Date   CATARACT EXTRACTION W/PHACO Right 03/25/2014   Procedure: CATARACT EXTRACTION PHACO AND INTRAOCULAR LENS PLACEMENT; CDE:9.76;  Surgeon: Dow JULIANNA Burke, MD;  Location: [facility];  Service: Ophthalmology;  Laterality: Right;   CATARACT EXTRACTION W/PHACO Left 06/24/2014   Procedure: CATARACT EXTRACTION PHACO AND INTRAOCULAR LENS PLACEMENT (IOC);  Surgeon: Dow JULIANNA Burke, MD;  Location: [facility];  Service: Ophthalmology;  Laterality: Left;  CDE:8.45   CIRCUMCISION N/A 03/26/2016   Procedure: CIRCUMCISION ADULT;  Surgeon: Arlena Chales, MD;  Location: [facility];  Service: Urology;  Laterality: N/AMERL IN[place], NON-NEWBORN  1985   COLONOSCOPY  06-19-13, 03/05/15   per Dr. 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 Beverley    LAPAROSCOPIC PARTIAL COLECTOMY N/A 08/07/2013   Procedure: LAPAROSCOPIC right PARTIAL COLECTOMY;  Surgeon: Bernarda Ned, MD;  Location: [facility];  Service: General;  Laterality: N/A;   MULTIPLE TOOTH EXTRACTIONS     PROSTATE SURGERY  03-29-11   had CTT per Dr. Chales, in office   Social History:  reports that he quit smoking about 56 years ago. His smoking use included cigarettes. He started smoking about 61 years ago. He has a 5 pack-year smoking history. His smokeless tobacco use includes chew. He reports that he does not drink alcohol and does not use drugs.  Allergies[1]  Family History  Problem Relation Age of Onset   Hypertension Mother    Diabetes Mother    Heart failure Mother        cad/mi with rupture ventricle   Hyperlipidemia Father    Colon polyps Father    Cancer Neg Hx        colon or prostate   Colon cancer Neg Hx    Esophageal cancer Neg Hx    Rectal cancer Neg Hx    Stomach cancer Neg Hx  Prior to Admission medications  Medication Sig Start Date End Date Taking? Authorizing Provider  acetaminophen  (TYLENOL ) 325 MG tablet Take 2 tablets (650 mg total) by mouth every 6 (six) hours as needed for mild pain (pain score 1-3), headache or fever (or Fever >/= 101). 03/21/23   Pearlean Manus, MD  albuterol  (VENTOLIN  HFA) 108 (90 Base) MCG/ACT inhaler Inhale 2 puffs into the lungs every 4 (four) hours as needed for wheezing or shortness of breath. 03/21/23   Pearlean Manus, MD  aspirin  EC 81 MG tablet Take 1 tablet (81 mg total) by mouth daily with breakfast. 03/21/23   Emokpae, Courage, MD  atorvastatin  (LIPITOR) 40 MG tablet Take 1 tablet (40 mg total) by mouth daily. 08/31/23    Johnny Garnette LABOR, MD  budesonide -formoterol  (SYMBICORT ) 160-4.5 MCG/ACT inhaler Inhale 2 puffs into the lungs in the morning and at bedtime. 03/21/23   Pearlean Manus, MD  cephALEXin  (KEFLEX ) 500 MG capsule Take 1 capsule (500 mg total) by mouth 4 (four) times daily. 02/06/24   Bernard Drivers, MD  Cholecalciferol (VITAMIN D3 PO) Take 1 tablet by mouth daily at 12 noon.    [provider]  clotrimazole -betamethasone  (LOTRISONE ) cream Apply 1 Application topically 2 (two) times daily. 08/31/23   Johnny Garnette LABOR, MD  doxycycline  (VIBRA -TABS) 100 MG tablet Take 1 tablet (100 mg total) by mouth 2 (two) times daily. 02/29/24   Valdemar Rocky SAUNDERS, NP  furosemide  (LASIX ) 40 MG tablet Take 1 tablet (40 mg total) by mouth daily. 07/15/23   Johnny Garnette LABOR, MD  metoprolol  succinate (TOPROL -XL) 50 MG 24 hr tablet TAKE 1 TABLET BY MOUTH EVERY DAY 07/15/23   Johnny Garnette LABOR, MD  Multiple Vitamins-Minerals (CENTRUM ADULTS PO) Take 1 tablet by mouth daily.    [provider]  potassium chloride  SA (KLOR-CON  M) 20 MEQ tablet Take 1 tablet (20 mEq total) by mouth daily. TAKE 1 TABLET(20 MEQ) BY MOUTH DAILY 08/31/23   Johnny Garnette LABOR, MD  venlafaxine  XR (EFFEXOR  XR) 150 MG 24 hr capsule Take 1 capsule (150 mg total) by mouth daily with breakfast. 01/17/24   Johnny Garnette LABOR, MD    Physical Exam: Vitals:   04/17/24 2054 04/17/24 2225 04/17/24 2245 04/17/24 2305  BP:  118/61 105/68 (!) 126/96  Pulse:  (!) 116 100 (!) 115  Resp:  18    Temp:  98.3 F (36.8 C)    TempSrc:  Oral    SpO2:  94% 93% 93%  Weight: (!) 151.5 kg     Height: 6' (1.829 m)      *** Data Reviewed: {Tip this will not be part of the note when signed- Document your independent interpretation of telemetry tracing, EKG, lab, Radiology test or any other diagnostic tests. Add any new diagnostic test ordered today. (Optional):26781} {Results:26384}  Assessment and Plan: No notes have been filed under this hospital service. Service:  Hospitalist     Advance Care Planning:   Code Status: Prior ***  Consults: ***  Family Communication: ***  Severity of Illness: {Observation/Inpatient:21159}  Author: Posey Maier, DO 04/17/2024 11:34 PM  For on call review [email or web address].     [1]  Allergies Allergen Reactions   Ivp Dye [Iodinated Contrast Media] Anaphylaxis    Iv dye allergy in 1985, anaphylaxix//a.calhoun- shocked back to life   Penicillins Other (See Comments)    Urticaria Has patient had a PCN reaction causing immediate rash, facial/tongue/throat swelling, SOB or lightheadedness with hypotension: no Has patient had a PCN reaction  causing severe rash involving mucus membranes or skin necrosis: no Has patient had a PCN reaction that required hospitalization no Has patient had a PCN reaction occurring within the last 10 years: yes If all of the above answers are NO, then may proceed with Cephalosporin use.    Lisinopril  Cough   "

## 2024-04-17 NOTE — Telephone Encounter (Signed)
 FYI Only or Action Required?: FYI only for provider: requesting medication, declined appt at this time d/t weather.  Patient was last seen in primary care on 08/31/2023 by Gabriel Garnette LABOR, MD.  Called Nurse Triage reporting Dysuria.  Symptoms began today.  Interventions attempted: Nothing.  Symptoms are: unchanged.  Triage Disposition: See Physician Within 24 Hours  Patient/caregiver understands and will follow disposition?: No, wishes to speak with PCP  Summary: Pain and burning when voiding   Reason for Triage: Pain and burning when voiding.          Reason for Disposition  All other males with painful urination  Answer Assessment - Initial Assessment Questions 1. SEVERITY: How bad is the pain?  (e.g., Scale 1-10; mild, moderate, or severe)     8 when he voids 2. FREQUENCY: How many times have you had painful urination today?      5 3. PATTERN: Is pain present every time you urinate or just sometimes?      yes 4. ONSET: When did the painful urination start?      today 5. FEVER: Do you have a fever? If Yes, ask: What is your temperature, how was it measured, and when did it start?     denies 6. PAST UTI: Have you had a urine infection before? If Yes, ask: When was the last time? and What happened that time?      Has had some before, not recent 7. CAUSE: What do you think is causing the painful urination?      UTI 8. OTHER SYMPTOMS: Do you have any other symptoms? (e.g., flank pain, penis discharge, scrotal pain, blood in urine)     Increased urination  Pt wife states that they cannot come to the clinic d/t weather, requesting a medication be called in to the pharm.  Protocols used: Urination Pain - Male-A-AH

## 2024-04-18 ENCOUNTER — Other Ambulatory Visit: Payer: Self-pay | Admitting: Family Medicine

## 2024-04-18 LAB — CBC
HCT: 42 % (ref 39.0–52.0)
Hemoglobin: 13.3 g/dL (ref 13.0–17.0)
MCH: 29.4 pg (ref 26.0–34.0)
MCHC: 31.7 g/dL (ref 30.0–36.0)
MCV: 92.7 fL (ref 80.0–100.0)
Platelets: 196 10*3/uL (ref 150–400)
RBC: 4.53 MIL/uL (ref 4.22–5.81)
RDW: 15.2 % (ref 11.5–15.5)
WBC: 10.8 10*3/uL — ABNORMAL HIGH (ref 4.0–10.5)
nRBC: 0 % (ref 0.0–0.2)

## 2024-04-18 LAB — COMPREHENSIVE METABOLIC PANEL WITH GFR
ALT: 21 U/L (ref 0–44)
AST: 31 U/L (ref 15–41)
Albumin: 3.3 g/dL — ABNORMAL LOW (ref 3.5–5.0)
Alkaline Phosphatase: 103 U/L (ref 38–126)
Anion gap: 10 (ref 5–15)
BUN: 18 mg/dL (ref 8–23)
CO2: 28 mmol/L (ref 22–32)
Calcium: 8.2 mg/dL — ABNORMAL LOW (ref 8.9–10.3)
Chloride: 105 mmol/L (ref 98–111)
Creatinine, Ser: 0.79 mg/dL (ref 0.61–1.24)
GFR, Estimated: >60 mL/min
Glucose, Bld: 96 mg/dL (ref 70–99)
Potassium: 3.3 mmol/L — ABNORMAL LOW (ref 3.5–5.1)
Sodium: 143 mmol/L (ref 135–145)
Total Bilirubin: 0.4 mg/dL (ref 0.0–1.2)
Total Protein: 5.7 g/dL — ABNORMAL LOW (ref 6.5–8.1)

## 2024-04-18 LAB — PHOSPHORUS: Phosphorus: 2.9 mg/dL (ref 2.5–4.6)

## 2024-04-18 LAB — LACTIC ACID, PLASMA
Lactic Acid, Venous: 2 mmol/L (ref 0.5–1.9)
Lactic Acid, Venous: 1.3 mmol/L (ref 0.5–1.9)
Lactic Acid, Venous: 1.1 mmol/L (ref 0.5–1.9)

## 2024-04-18 LAB — MAGNESIUM: Magnesium: 2.1 mg/dL (ref 1.7–2.4)

## 2024-04-18 MED ORDER — FLORANEX PO PACK: 1.0000 g | PACK | Freq: Three times a day (TID) | ORAL | 0 refills | Status: AC

## 2024-04-18 MED ORDER — ATORVASTATIN CALCIUM 40 MG PO TABS
40.0000 mg | ORAL_TABLET | Freq: Every day | ORAL | Status: DC
  Administered 2024-04-18: 40 mg via ORAL
  Filled 2024-04-18: qty 1

## 2024-04-18 MED ORDER — LACTATED RINGERS IV SOLN: INTRAVENOUS | Status: AC

## 2024-04-18 MED ORDER — ACETAMINOPHEN 650 MG RE SUPP: 650.0000 mg | Freq: Four times a day (QID) | RECTAL | Status: DC | PRN

## 2024-04-18 MED ORDER — ALBUTEROL SULFATE HFA 108 (90 BASE) MCG/ACT IN AERS: 2.0000 | INHALATION_SPRAY | RESPIRATORY_TRACT | Status: DC | PRN

## 2024-04-18 MED ORDER — CIPROFLOXACIN HCL 500 MG PO TABS: 500.0000 mg | ORAL_TABLET | Freq: Two times a day (BID) | ORAL | 0 refills | Status: DC

## 2024-04-18 MED ORDER — POTASSIUM CHLORIDE 10 MEQ/100ML IV SOLN
10.0000 meq | INTRAVENOUS | Status: AC
  Administered 2024-04-18 (×3): 10 meq via INTRAVENOUS
  Filled 2024-04-18 (×3): qty 100

## 2024-04-18 MED ORDER — FUROSEMIDE 40 MG PO TABS
40.0000 mg | ORAL_TABLET | Freq: Every day | ORAL | Status: DC
  Filled 2024-04-18: qty 1

## 2024-04-18 MED ORDER — TAMSULOSIN HCL 0.4 MG PO CAPS: 0.4000 mg | ORAL_CAPSULE | Freq: Every day | ORAL | 0 refills | Status: AC

## 2024-04-18 MED ORDER — TAMSULOSIN HCL 0.4 MG PO CAPS
0.4000 mg | ORAL_CAPSULE | Freq: Every day | ORAL | Status: DC
  Administered 2024-04-18: 0.4 mg via ORAL
  Filled 2024-04-18: qty 1

## 2024-04-18 MED ORDER — CEFDINIR 300 MG PO CAPS: 300.0000 mg | ORAL_CAPSULE | Freq: Two times a day (BID) | ORAL | 0 refills | Status: DC

## 2024-04-18 MED ORDER — METOPROLOL SUCCINATE ER 50 MG PO TB24
50.0000 mg | ORAL_TABLET | Freq: Every day | ORAL | Status: DC
  Administered 2024-04-18: 50 mg via ORAL
  Filled 2024-04-18: qty 1

## 2024-04-18 MED ORDER — CEFUROXIME AXETIL 500 MG PO TABS: 500.0000 mg | ORAL_TABLET | Freq: Two times a day (BID) | ORAL | Status: DC

## 2024-04-18 MED ORDER — ENOXAPARIN SODIUM 80 MG/0.8ML IJ SOSY
75.0000 mg | PREFILLED_SYRINGE | INTRAMUSCULAR | Status: DC
  Administered 2024-04-18: 75 mg via SUBCUTANEOUS
  Filled 2024-04-18: qty 0.8

## 2024-04-18 MED ORDER — CEFUROXIME AXETIL 500 MG PO TABS: 500.0000 mg | ORAL_TABLET | Freq: Two times a day (BID) | ORAL | 0 refills | Status: AC

## 2024-04-18 MED ORDER — ONDANSETRON HCL 4 MG/2ML IJ SOLN: 4.0000 mg | Freq: Four times a day (QID) | INTRAMUSCULAR | Status: DC | PRN

## 2024-04-18 MED ORDER — FLUTICASONE FUROATE-VILANTEROL 100-25 MCG/ACT IN AEPB
1.0000 | INHALATION_SPRAY | Freq: Every day | RESPIRATORY_TRACT | Status: DC
  Filled 2024-04-18: qty 28

## 2024-04-18 MED ORDER — ACETAMINOPHEN 325 MG PO TABS
650.0000 mg | ORAL_TABLET | Freq: Four times a day (QID) | ORAL | Status: DC | PRN
  Administered 2024-04-18: 650 mg via ORAL
  Filled 2024-04-18: qty 2

## 2024-04-18 MED ORDER — ONDANSETRON HCL 4 MG PO TABS: 4.0000 mg | ORAL_TABLET | Freq: Four times a day (QID) | ORAL | Status: DC | PRN

## 2024-04-18 MED ORDER — ALBUTEROL SULFATE (2.5 MG/3ML) 0.083% IN NEBU: 2.5000 mg | INHALATION_SOLUTION | RESPIRATORY_TRACT | Status: DC | PRN

## 2024-04-18 MED ORDER — SODIUM CHLORIDE 0.9 % IV SOLN: 1.0000 g | INTRAVENOUS | Status: DC

## 2024-04-18 NOTE — Progress Notes (Signed)
 Transition of Care Department [facility]) has reviewed patient and no other TOC needs have been identified at this time. We will continue to monitor patient advancement through interdisciplinary progression rounds. If new patient transition needs arise, please place a TOC consult.   04/18/24 0820  TOC Brief Assessment  Insurance and Status Reviewed  Patient has primary care physician Yes  Home environment has been reviewed Lives with wife.  Prior level of function: Wife assists with ADLs as needed.  Prior/Current Home Services No current home services  Social Drivers of Health Review SDOH reviewed no interventions necessary  Readmission risk has been reviewed Yes  Transition of care needs no transition of care needs at this time

## 2024-04-18 NOTE — Plan of Care (Signed)

## 2024-04-18 NOTE — Telephone Encounter (Signed)
 Pt was seen at the ED on 04/17/24 for this problem

## 2024-04-18 NOTE — Telephone Encounter (Signed)
 It sounds like he has a urinary tract infection. He should keep taking the Furosamide as usual. We will add 10 days of Cipro  to this for the infection.

## 2024-04-18 NOTE — Progress Notes (Signed)
 Done

## 2024-04-18 NOTE — Telephone Encounter (Signed)
 I cancelled the Cipro  because he was given Cefuroxime  at the hospital

## 2024-04-18 NOTE — Discharge Summary (Addendum)
 "  Physician Discharge Summary  Gabriel Love FMW:[id number] DOB: [birth year] DOA: 04/17/2024  PCP: Johnny Garnette LABOR, MD  Admit date: 04/17/2024 Discharge date: 04/18/2024  Admitted from: Home Discharge disposition: Home  Recommendations at discharge:  Complete the course of antibiotics with 5 more days of oral Cefuroxime , probiotics.  If the blood culture, urine culture reported positive, antibiotics may need to be changed or you may need to return back to the hospital. You have been started on Flomax  0.4 mg daily.  Also given outpatient referral for urology evaluation. I would suggest that you continue to take Lasix  daily as before but not at night, better around 4 PM to 5 PM in the afternoon.   Subjective: Patient was seen and examined this morning Pleasant elderly morbidly obese male.  Lying down in bed.  Not in distress.  Alert, awake, oriented x 3.  Hard of hearing.  Wife at bedside, she did most of the conversation. Remains afebrile, heart rate is still elevated in 100s, blood pressure in normal range, breathing on room air. Labs this morning with potassium 3.3 Patient wants to go home.  I mention to him about pending urine culture, blood culture result but he still wants to go home.  He says he does not have his teeth, hearing aid here and feels embarrassed.   Brief narrative: KHALEEF RUBY is a 84 y.o. male with PMH significant for morbid obesity, HTN, HLD, diastolic CHF, BPH, depression Lives at home with wife. 1/27, patient was brought to the ED with complaint of dysuria, increased urgency for 24 hours, stated with progressive generalized weakness.  In the ED, patient is afebrile, tachycardic to 120s, blood pressure normal, breathing on room air Labs with WC count 11.3, potassium 3.3, proBNP 109 Lactic acid 2.3 >2 >1.3 Urinalysis showed cloudy yellow urine with moderate hemoglobin large leukocytes, positive nitrite, many bacteria Chest x-ray unremarkable Urine culture, blood  culture were sent  Patient was started on IV Rocephin , IV fluid Admitted to [facility]  Assessment and plan: Severe sepsis POA UTI  Presented with dysuria, urgency  No fever but WBC count, lactic acid, heart rate was elevated Urinalysis positive as above.  Urine culture, blood culture sent  Currently on IV Rocephin  Patient clinically feels better.  No fever. WBC count improving.  Lactic acid level normalized. Ideally I would like to keep him in the hospital urine culture and blood culture results are out but patient desperately wants to go home today.  I discussed with him and his wife about discharging on oral Cefuroxime  for next 5 days with probiotics.  If the urine culture, blood pressure grows resistant bacteria requiring change in antibiotics, I will call the wife to change antibiotics.  I have also mentioned that if IV antibiotics is the only option, he might have to come back to ED.  Patient and wife are agreeable to the plan. Recent Labs  Lab 04/17/24 2201 04/17/24 2232 04/18/24 0046 04/18/24 0528 04/18/24 0819  WBC 11.3*  --   --  10.8*  --   LATICACIDVEN  --  2.3* 2.0* 1.3 1.1   Suspect BPH Patient endorses nocturia, hesitancy, poor stream.  Probably has underlying BPH with might have precipitated UTI. I have started him on Flomax  0.4 mg daily and also given a referral to urology for evaluation   Hypokalemia Potassium low at 3.3 today.  Replacement given.  Continue daily potassium supplement with Lasix . Recent Labs  Lab 04/17/24 2201 04/18/24 0046 04/18/24 0528  NA  145  --  143  K 3.3*  --  3.3*  CL 104  --  105  CO2 29  --  28  GLUCOSE 104*  --  96  BUN 18  --  18  CREATININE 0.85  --  0.79  CALCIUM  8.4*  --  8.2*  MG  --  2.1  --   PHOS  --  2.9  --    Chronic diastolic CHF Last echo from December 2024 with EF 60 to 65% Not on exacerbation Continue metoprolol , Lasix .  Apparently he takes Lasix  at 10 PM and wakes up multiple times a night to urinate.  He does  not want to take it in the morning because he says, 'then I do not get to go out anywhere because I do pee all the time.'.  I have advised him to take Lasix  around 4 PM in the afternoon.   HLD Continue statin  H/o asthma Respiratory status stable. Continue Breo Ellipta , albuterol   Morbid Obesity  Body mass index is 45.3 kg/m. Patient has been advised to make an attempt to improve diet and exercise patterns to aid in weight loss.  Goals of care   Code Status: Full Code   Diet:  Diet Order             Diet heart healthy/carb modified Room service appropriate? Yes; Fluid consistency: Thin  Diet effective now           Diet general                   Nutritional status:  Body mass index is 45.3 kg/m.       Wounds:  -    Discharge Medications:   Allergies as of 04/18/2024       Reactions   Ivp Dye [iodinated Contrast Media] Anaphylaxis   Iv dye allergy in 1985, anaphylaxix//a.calhoun- shocked back to life   Penicillins Other (See Comments)   Urticaria Has patient had a PCN reaction causing immediate rash, facial/tongue/throat swelling, SOB or lightheadedness with hypotension: no Has patient had a PCN reaction causing severe rash involving mucus membranes or skin necrosis: no Has patient had a PCN reaction that required hospitalization no Has patient had a PCN reaction occurring within the last 10 years: yes If all of the above answers are NO, then may proceed with Cephalosporin use.   Lisinopril  Cough        Medication List     STOP taking these medications    cephALEXin  500 MG capsule Commonly known as: KEFLEX    doxycycline  100 MG tablet Commonly known as: VIBRA -TABS       TAKE these medications    acetaminophen  325 MG tablet Commonly known as: TYLENOL  Take 2 tablets (650 mg total) by mouth every 6 (six) hours as needed for mild pain (pain score 1-3), headache or fever (or Fever >/= 101).   albuterol  108 (90 Base) MCG/ACT  inhaler Commonly known as: VENTOLIN  HFA Inhale 2 puffs into the lungs every 4 (four) hours as needed for wheezing or shortness of breath.   aspirin  EC 81 MG tablet Take 1 tablet (81 mg total) by mouth daily with breakfast.   atorvastatin  40 MG tablet Commonly known as: LIPITOR Take 1 tablet (40 mg total) by mouth daily.   budesonide -formoterol  160-4.5 MCG/ACT inhaler Commonly known as: Symbicort  Inhale 2 puffs into the lungs in the morning and at bedtime.   cefUROXime  500 MG tablet Commonly known as: CEFTIN  Take 1 tablet (  500 mg total) by mouth 2 (two) times daily for 5 days.   CENTRUM ADULTS PO Take 1 tablet by mouth daily.   clotrimazole -betamethasone  cream Commonly known as: LOTRISONE  Apply 1 Application topically 2 (two) times daily.   furosemide  40 MG tablet Commonly known as: LASIX  Take 1 tablet (40 mg total) by mouth daily.   lactobacillus Pack Take 1 packet (1 g total) by mouth 3 (three) times daily with meals for 7 days.   metoprolol  succinate 50 MG 24 hr tablet Commonly known as: TOPROL -XL TAKE 1 TABLET BY MOUTH EVERY DAY   potassium chloride  SA 20 MEQ tablet Commonly known as: KLOR-CON  M Take 1 tablet (20 mEq total) by mouth daily. TAKE 1 TABLET(20 MEQ) BY MOUTH DAILY   tamsulosin  0.4 MG Caps capsule Commonly known as: FLOMAX  Take 1 capsule (0.4 mg total) by mouth daily after breakfast.   venlafaxine  XR 150 MG 24 hr capsule Commonly known as: Effexor  XR Take 1 capsule (150 mg total) by mouth daily with breakfast.   VITAMIN D3 PO Take 1 tablet by mouth daily at 12 noon.          Follow ups:    Follow-up Information     Johnny Garnette LABOR, MD Follow up.   Specialty: Family Medicine Contact information: [address] [place] [address] [phone]                 Discharge Instructions:   Discharge Instructions     Ambulatory referral to Urology   Complete by: As directed    Elderly male hospitalized with UTI.   Suspect underlying BPH.  Did not want to wait in the hospital for imaging.  Has been started on Flomax    Call MD for:  difficulty breathing, headache or visual disturbances   Complete by: As directed    Call MD for:  extreme fatigue   Complete by: As directed    Call MD for:  hives   Complete by: As directed    Call MD for:  persistant dizziness or light-headedness   Complete by: As directed    Call MD for:  persistant nausea and vomiting   Complete by: As directed    Call MD for:  severe uncontrolled pain   Complete by: As directed    Call MD for:  temperature >100.4   Complete by: As directed    Diet general   Complete by: As directed    Discharge instructions   Complete by: As directed    Recommendations at discharge:   Complete the course of antibiotics with 5 more days of oral Cefuroxime , probiotics.   If the blood culture, urine culture reported positive, antibiotics may need to be changed or you may need to return back to the hospital.  You have been started on Flomax  0.4 mg daily.  Also given outpatient referral for urology evaluation.  I would suggest that you continue to take Lasix  daily as before but not at night, better around 4 PM to 5 PM in the afternoon.  General discharge instructions: Follow with Primary MD Johnny Garnette LABOR, MD in 7 days  Please request your PCP  to go over your hospital tests, procedures, radiology results at the follow up. Please get your medicines reviewed and adjusted.  Your PCP may decide to repeat certain labs or tests as needed. Do not drive, operate heavy machinery, perform activities at heights, swimming or participation in water activities or provide baby sitting services if your were  admitted for syncope or siezures until you have seen by Primary MD or a Neurologist and advised to do so again.   Controlled Substance Reporting System database was reviewed. Do not drive, operate heavy machinery, perform activities at heights,  swim, participate in water activities or provide baby-sitting services while on medications for pain, sleep and mood until your outpatient physician has reevaluated you and advised to do so again.  You are strongly recommended to comply with the dose, frequency and duration of prescribed medications. Activity: As tolerated with Full fall precautions use walker/cane & assistance as needed Avoid using any recreational substances like cigarette, tobacco, alcohol, or non-prescribed drug. If you experience worsening of your admission symptoms, develop shortness of breath, life threatening emergency, suicidal or homicidal thoughts you must seek medical attention immediately by calling 911 or calling your MD immediately  if symptoms less severe. You must read complete instructions/literature along with all the possible adverse reactions/side effects for all the medicines you take and that have been prescribed to you. Take any new medicine only after you have completely understood and accepted all the possible adverse reactions/side effects.  Wear Seat belts while driving. You were cared for by a hospitalist during your hospital stay. If you have any questions about your discharge medications or the care you received while you were in the hospital after you are discharged, you can call the unit and ask to speak with the hospitalist or the covering physician. Once you are discharged, your primary care physician will handle any further medical issues. Please note that NO REFILLS for any discharge medications will be authorized once you are discharged, as it is imperative that you return to your primary care physician (or establish a relationship with a primary care physician if you do not have one).   Increase activity slowly   Complete by: As directed        Discharge Exam:   Vitals:   04/18/24 0445 04/18/24 0630 04/18/24 0645 04/18/24 0748  BP: (!) 124/59 136/66  (!) 147/80  Pulse: (!) 109  (!) 101 98   Resp: 20     Temp: 98.4 F (36.9 C)   98 F (36.7 C)  TempSrc: Oral   Oral  SpO2: 94%  93% 90%  Weight:      Height:        Body mass index is 45.3 kg/m.  General exam: Pleasant, elderly morbidly obese Caucasian male.  Not in distress Skin: No rashes, lesions or ulcers. HEENT: Atraumatic, normocephalic, no obvious bleeding Lungs: Clear to auscultation bilaterally,  CVS: S1, S2, no murmur,   GI/Abd: Soft, nontender, distended from obesity, bowel sound present,   CNS: Alert, awake, oriented x 3.  Hard of hearing Psychiatry: Appropriate Extremities: Chronic bilateral pedal edema, no calf tenderness,    The results of significant diagnostics from this hospitalization (including imaging, microbiology, ancillary and laboratory) are listed below for reference.    Procedures and Diagnostic Studies:   DG Chest Port 1 View Result Date: 04/17/2024 EXAM: 1 VIEW XRAY OF THE CHEST 04/17/2024 09:50:00 PM COMPARISON: Comparison with 03/19/2023. CLINICAL HISTORY: Shortness of breath. FINDINGS: LUNGS AND PLEURA: No focal pulmonary opacity. No pleural effusion. No pneumothorax. HEART AND MEDIASTINUM: No acute abnormality of the cardiac and mediastinal silhouettes. Atherosclerotic plaque. BONES AND SOFT TISSUES: Degenerative changes of shoulders. IMPRESSION: 1. No acute findings. Electronically signed by: Elsie Gravely MD 04/17/2024 09:57 PM EST RP Workstation: [id number]     Labs:   Basic Metabolic Panel: Recent  Labs  Lab 04/17/24 2201 04/18/24 0046 04/18/24 0528  NA 145  --  143  K 3.3*  --  3.3*  CL 104  --  105  CO2 29  --  28  GLUCOSE 104*  --  96  BUN 18  --  18  CREATININE 0.85  --  0.79  CALCIUM  8.4*  --  8.2*  MG  --  2.1  --   PHOS  --  2.9  --    GFR Estimated Creatinine Clearance: 104.2 mL/min (by C-G formula based on SCr of 0.79 mg/dL). Liver Function Tests: Recent Labs  Lab 04/18/24 0528  AST 31  ALT 21  ALKPHOS 103  BILITOT 0.4  PROT 5.7*  ALBUMIN 3.3*    No results for input(s): LIPASE, AMYLASE in the last 168 hours. No results for input(s): AMMONIA in the last 168 hours. Coagulation profile No results for input(s): INR, PROTIME in the last 168 hours.  CBC: Recent Labs  Lab 04/17/24 2201 04/18/24 0528  WBC 11.3* 10.8*  NEUTROABS 8.2*  --   HGB 14.4 13.3  HCT 45.4 42.0  MCV 93.0 92.7  PLT 217 196   Cardiac Enzymes: No results for input(s): CKTOTAL, CKMB, CKMBINDEX, TROPONINI in the last 168 hours. BNP: Invalid input(s): POCBNP CBG: No results for input(s): GLUCAP in the last 168 hours. D-Dimer No results for input(s): DDIMER in the last 72 hours. Hgb A1c No results for input(s): HGBA1C in the last 72 hours. Lipid Profile No results for input(s): CHOL, HDL, LDLCALC, TRIG, CHOLHDL, LDLDIRECT in the last 72 hours. Thyroid  function studies No results for input(s): TSH, T4TOTAL, T3FREE, THYROIDAB in the last 72 hours.  Invalid input(s): FREET3 Anemia work up No results for input(s): VITAMINB12, FOLATE, FERRITIN, TIBC, IRON, RETICCTPCT in the last 72 hours. Microbiology Recent Results (from the past 240 hours)  Culture, blood (routine x 2)     Status: None (Preliminary result)   Collection Time: 04/17/24 10:30 PM   Specimen: BLOOD  Result Value Ref Range Status   Specimen Description BLOOD BLOOD RIGHT ARM  Final   Special Requests   Final    BOTTLES DRAWN AEROBIC AND ANAEROBIC Blood Culture adequate volume   Culture   Final    NO GROWTH < 12 HOURS Performed at [facility], [address] [place], [place] [address]    Report Status PENDING  Incomplete  Culture, blood (routine x 2)     Status: None (Preliminary result)   Collection Time: 04/17/24 10:32 PM   Specimen: BLOOD  Result Value Ref Range Status   Specimen Description BLOOD BLOOD RIGHT HAND  Final   Special Requests   Final    BOTTLES DRAWN AEROBIC ONLY Blood Culture adequate volume   Culture    Final    NO GROWTH < 12 HOURS Performed at [facility], [address], [place], [place] [address]    Report Status PENDING  Incomplete    Time coordinating discharge: 45 minutes  Signed: Honi Name  [facility] 04/18/2024, 11:25 AM  "

## 2024-04-18 NOTE — Care Management CC44 (Signed)
"         Condition Code 44 Documentation Completed  Patient Details  Name: CLARK CUFF MRN: [id number] Date of Birth: March 31, 1940   Condition Code 44 given:  Yes Patient signature on Condition Code 44 notice:  Yes Documentation of 2 MD's agreement:  Yes Code 44 added to claim:  Yes    Mcarthur Saddie Kim, LCSW 04/18/2024, 11:09 AM  "

## 2024-04-18 NOTE — Telephone Encounter (Signed)
 Spoke with pt spouse voiced understanding of Dr Johnny recommendation

## 2024-04-18 NOTE — Care Management Obs Status (Signed)
 MEDICARE OBSERVATION STATUS NOTIFICATION   Patient Details  Name: Gabriel Love MRN: [id number] Date of Birth: Oct 08, 1940   Medicare Observation Status Notification Given:  Yes    Mcarthur Saddie Kim, LCSW 04/18/2024, 11:09 AM

## 2024-04-19 ENCOUNTER — Telehealth: Payer: Self-pay

## 2024-04-19 NOTE — Transitions of Care (Post Inpatient/ED Visit) (Signed)
" ° °  04/19/2024  Name: Gabriel Love MRN: [id number] DOB: 01/09/1941  Today's TOC FU Call Status: Today's TOC FU Call Status:: Unsuccessful Call (1st Attempt) Unsuccessful Call (1st Attempt) Date: 04/19/24  Attempted to reach the patient regarding the most recent Inpatient/ED visit.  Follow Up Plan: Additional outreach attempts will be made to reach the patient to complete the Transitions of Care (Post Inpatient/ED visit) call.   Signature Julian Lemmings, LPN [facility] Nurse Health Advisor Direct Dial [phone]  "

## 2024-04-20 LAB — URINE CULTURE: Culture: >=100000 — AB

## 2024-04-20 NOTE — Transitions of Care (Post Inpatient/ED Visit) (Unsigned)
" ° °  04/20/2024  Name: Gabriel Love MRN: [id number] DOB: 1940-07-18  Today's TOC FU Call Status: Today's TOC FU Call Status:: Unsuccessful Call (2nd Attempt) Unsuccessful Call (1st Attempt) Date: 04/19/24 Unsuccessful Call (2nd Attempt) Date: 04/20/24  Attempted to reach the patient regarding the most recent Inpatient/ED visit.  Follow Up Plan: Additional outreach attempts will be made to reach the patient to complete the Transitions of Care (Post Inpatient/ED visit) call.   Signature Julian Lemmings, LPN [facility] Nurse Health Advisor Direct Dial [phone]  "

## 2024-04-22 LAB — CULTURE, BLOOD (ROUTINE X 2)
Culture: NO GROWTH
Culture: NO GROWTH
Special Requests: ADEQUATE
Special Requests: ADEQUATE

## 2024-06-08 ENCOUNTER — Ambulatory Visit: Admitting: Urology

## 2024-06-13 ENCOUNTER — Ambulatory Visit: Admitting: Podiatry
